# Patient Record
Sex: Female | Born: 1935
Health system: Southern US, Community
[De-identification: ages and names within clinical notes are randomized; demographics above are authoritative.]

## PROBLEM LIST (undated history)

## (undated) DIAGNOSIS — R7303 Prediabetes: Secondary | ICD-10-CM

## (undated) DIAGNOSIS — C44301 Unspecified malignant neoplasm of skin of nose: Secondary | ICD-10-CM

## (undated) DIAGNOSIS — E039 Hypothyroidism, unspecified: Secondary | ICD-10-CM

## (undated) DIAGNOSIS — M797 Fibromyalgia: Secondary | ICD-10-CM

## (undated) DIAGNOSIS — I10 Essential (primary) hypertension: Secondary | ICD-10-CM

## (undated) DIAGNOSIS — M1990 Unspecified osteoarthritis, unspecified site: Secondary | ICD-10-CM

## (undated) DIAGNOSIS — D500 Iron deficiency anemia secondary to blood loss (chronic): Secondary | ICD-10-CM

## (undated) DIAGNOSIS — R002 Palpitations: Secondary | ICD-10-CM

## (undated) DIAGNOSIS — E785 Hyperlipidemia, unspecified: Secondary | ICD-10-CM

## (undated) DIAGNOSIS — Z9989 Dependence on other enabling machines and devices: Principal | ICD-10-CM

## (undated) DIAGNOSIS — IMO0002 Reserved for concepts with insufficient information to code with codable children: Secondary | ICD-10-CM

## (undated) DIAGNOSIS — G4733 Obstructive sleep apnea (adult) (pediatric): Secondary | ICD-10-CM

## (undated) DIAGNOSIS — Z85828 Personal history of other malignant neoplasm of skin: Secondary | ICD-10-CM

## (undated) DIAGNOSIS — K279 Peptic ulcer, site unspecified, unspecified as acute or chronic, without hemorrhage or perforation: Secondary | ICD-10-CM

## (undated) HISTORY — DX: Fibromyalgia: M79.7

## (undated) HISTORY — DX: Obstructive sleep apnea (adult) (pediatric): G47.33

## (undated) HISTORY — DX: Prediabetes: R73.03

## (undated) HISTORY — DX: Personal history of other malignant neoplasm of skin: Z85.828

## (undated) HISTORY — DX: Peptic ulcer, site unspecified, unspecified as acute or chronic, without hemorrhage or perforation: K27.9

## (undated) HISTORY — DX: Hyperlipidemia, unspecified: E78.5

## (undated) HISTORY — PX: ABDOMINAL HYSTERECTOMY: SHX81

## (undated) HISTORY — DX: Reserved for concepts with insufficient information to code with codable children: IMO0002

## (undated) HISTORY — PX: EYE SURGERY: SHX253

## (undated) HISTORY — DX: Dependence on other enabling machines and devices: Z99.89

## (undated) HISTORY — DX: Unspecified malignant neoplasm of skin of nose: C44.301

## (undated) HISTORY — DX: Hypothyroidism, unspecified: E03.9

## (undated) HISTORY — DX: Unspecified osteoarthritis, unspecified site: M19.90

## (undated) HISTORY — DX: Iron deficiency anemia secondary to blood loss (chronic): D50.0

## (undated) HISTORY — DX: Palpitations: R00.2

## (undated) HISTORY — PX: BREAST BIOPSY: SHX20

---
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
## 2001-05-09 ENCOUNTER — Other Ambulatory Visit: Admission: RE | Admit: 2001-05-09 | Discharge: 2001-05-09 | Payer: Self-pay | Admitting: Family Medicine

## 2003-02-01 ENCOUNTER — Other Ambulatory Visit: Admission: RE | Admit: 2003-02-01 | Discharge: 2003-02-01 | Payer: Self-pay | Admitting: Family Medicine

## 2003-08-07 ENCOUNTER — Ambulatory Visit (HOSPITAL_COMMUNITY): Admission: RE | Admit: 2003-08-07 | Discharge: 2003-08-07 | Payer: Self-pay | Admitting: Internal Medicine

## 2003-10-18 ENCOUNTER — Ambulatory Visit (HOSPITAL_COMMUNITY): Admission: RE | Admit: 2003-10-18 | Discharge: 2003-10-18 | Payer: Self-pay | Admitting: Family Medicine

## 2005-02-01 ENCOUNTER — Other Ambulatory Visit: Admission: RE | Admit: 2005-02-01 | Discharge: 2005-02-01 | Payer: Self-pay | Admitting: Family Medicine

## 2008-08-22 LAB — HM DEXA SCAN

## 2008-10-16 ENCOUNTER — Encounter (INDEPENDENT_AMBULATORY_CARE_PROVIDER_SITE_OTHER): Payer: Self-pay | Admitting: Family Medicine

## 2008-10-16 ENCOUNTER — Ambulatory Visit: Payer: Self-pay | Admitting: Cardiology

## 2008-10-16 ENCOUNTER — Inpatient Hospital Stay (HOSPITAL_COMMUNITY): Admission: EM | Admit: 2008-10-16 | Discharge: 2008-10-17 | Payer: Self-pay | Admitting: Emergency Medicine

## 2008-11-14 DIAGNOSIS — I480 Paroxysmal atrial fibrillation: Secondary | ICD-10-CM

## 2008-11-14 DIAGNOSIS — E785 Hyperlipidemia, unspecified: Secondary | ICD-10-CM

## 2008-11-22 ENCOUNTER — Ambulatory Visit: Payer: Self-pay | Admitting: Cardiology

## 2008-12-16 ENCOUNTER — Ambulatory Visit: Payer: Self-pay | Admitting: Cardiology

## 2008-12-16 ENCOUNTER — Encounter (HOSPITAL_COMMUNITY): Admission: RE | Admit: 2008-12-16 | Discharge: 2009-01-15 | Payer: Self-pay | Admitting: Cardiology

## 2008-12-18 ENCOUNTER — Encounter (INDEPENDENT_AMBULATORY_CARE_PROVIDER_SITE_OTHER): Payer: Self-pay

## 2009-07-16 ENCOUNTER — Ambulatory Visit: Payer: Self-pay | Admitting: Cardiology

## 2009-07-18 ENCOUNTER — Encounter: Payer: Self-pay | Admitting: Cardiology

## 2009-11-17 LAB — HEMOCCULT GUIAC POC 1CARD (OFFICE)

## 2010-01-01 LAB — HM PAP SMEAR

## 2010-02-16 LAB — HM MAMMOGRAPHY

## 2010-06-23 NOTE — Assessment & Plan Note (Signed)
Summary: U0A   Visit Type:  Follow-up Primary Provider:  Dr. Rudi Heap   History of Present Illness: 75 year old woman presents for followup. She reports rare, brief palpitations, no chest pain, and no limiting shortness of breath with activity. She has been doing chores in the house and also some yard work, but plans to return to walking regularly when the weather warms up.  Followup Myoview in July 2010 revealed breast attenuation artifact without ischemia, LVEF greater than 65%.  Jenna [name] states that she was not able to tolerate Zocor related to hip pain. Lipids are being followed by Dr. Christell Constant, and she is taking omega-3 supplements at this time.  Current Medications (verified): 1)  Metoprolol Tartrate 50 Mg Tabs (Metoprolol Tartrate) .[name].. 1 Tablet Once Daily 2)  Synthroid 75 Mcg Tabs (Levothyroxine Sodium) .... Take 1 Tab Daily 3)  Fish Oil Concentrate 1000 Mg Caps (Omega-3 Fatty Acids) .... Take 1 Tab Daily 4)  Aspirin 81 Mg Tbec (Aspirin) .... Take One Tablet By Mouth Daily  Allergies (verified): No Known Drug Allergies  Past History:  Past Medical History: Last updated: 11/14/2008 Atrial Fibrillation Hyperlipidemia Hypothyroidism Fibromyalgia  Social History: Last updated: 11/14/2008 Married  Tobacco Use - No.  Alcohol Use - no  Clinical Review Panels:  Echocardiogram Echocardiogram   Study Conclusions    1. Left ventricle: The cavity size was at the lower limits of      normal. Wall thickness was increased increased in a pattern of      mild to moderate LVH. Systolic function was normal. The estimated      ejection fraction was in the range of 60% to 65%. Wall motion was      normal; there were no regional wall motion abnormalities. Doppler      parameters are consistent with abnormal left ventricular      relaxation (grade 1 diastolic dysfunction).   2. Aortic valve: Mildly calcified annulus. Trileaflet. Trivial      regurgitation.   3. Mitral  valve: Mild regurgitation.   4. Left atrium: The atrium was mildly dilated.   5. Right atrium: The atrium was mildly dilated. (10/16/2008)    Review of Systems  The patient denies anorexia, fever, chest pain, syncope, dyspnea on exertion, hemoptysis, melena, hematochezia, and severe indigestion/heartburn.         Otherwise reviewed and negative.  Vital Signs:  Patient profile:   75 year old female Height:      66 inches Weight:      217 pounds BMI:     35.15 Pulse rate:   61 / minute BP sitting:   118 / 59  (right arm)  Vitals Entered By: Dreama Saa, CNA (July 16, 2009 1:28 PM)  Physical Exam  Additional Exam:  Overweight woman in no acute distress. HEENT: Conjunctiva and lids normal, oropharynx with moist mucosa. Neck: Supple, no carotid bruits or thyromegaly. Lungs: Clear to auscultation, nonlabored. Cardiac: Regular rate and rhythm, no S3. Extremities: No pitting edema.   EKG  Procedure date:  07/16/2009  Findings:      Normal sinus rhythm at 66 beats per minute.  Impression & Recommendations:  Problem # 1:  ATRIAL FIBRILLATION (ICD-427.31)  Symptomatically quite stable, on present medical regimen including aspirin and low-dose metoprolol. Her electrocardiogram is normal, showing sinus rhythm today. We will plan to adopt an annual followup approach, assuming she develops no worsening interval symptoms. Otherwise she will continue regular followup with Dr. Christell Constant.  Her updated medication list for  this problem includes:    Metoprolol Tartrate 50 Mg Tabs (Metoprolol tartrate) .[name]... 1 tablet once daily    Aspirin 81 Mg Tbec (Aspirin) .[name]... Take one tablet by mouth daily  Problem # 2:  HYPERLIPIDEMIA (ICD-272.4)  Patient intolerant of Zocor. She is being followed by Dr. Christell Constant, on omega-3 supplements.  Patient Instructions: 1)  Your physician recommends that you schedule a follow-up appointment in: 1 year 2)  Your physician recommends that you continue on  your current medications as directed. Please refer to the Current Medication list given to you today.

## 2010-07-10 ENCOUNTER — Encounter: Payer: Self-pay | Admitting: Cardiology

## 2010-07-15 ENCOUNTER — Ambulatory Visit (INDEPENDENT_AMBULATORY_CARE_PROVIDER_SITE_OTHER): Payer: Medicare Other | Admitting: Cardiology

## 2010-07-15 ENCOUNTER — Encounter: Payer: Self-pay | Admitting: Cardiology

## 2010-07-15 DIAGNOSIS — E782 Mixed hyperlipidemia: Secondary | ICD-10-CM

## 2010-07-15 DIAGNOSIS — I4891 Unspecified atrial fibrillation: Secondary | ICD-10-CM

## 2010-07-16 ENCOUNTER — Encounter: Payer: Self-pay | Admitting: Cardiology

## 2010-07-21 NOTE — Assessment & Plan Note (Signed)
Summary: Jenna [name]   Visit Type:  Follow-up Primary Provider:  Dr. Rudi Heap   History of Present Illness: 75 year old woman presents for followup. She was seen in February 2011. Reports occasional palpitations, usually in the evenings, however no progression or change in pattern. Otherwise she remains active, working with her church, walking regularly.  Recent lab work per Dr. Christell Constant showed BUN 18, creatinine 0.6, sodium 141, potassium 4.6, AST 17, ALT 12, TSH 2.5.  She reports no focal weakness, speech or memory problems. Continues on aspirin.   Current Medications (verified): 1)  Metoprolol Tartrate 50 Mg Tabs (Metoprolol Tartrate) .[name].. 1 Tablet Once Daily 2)  Synthroid 75 Mcg Tabs (Levothyroxine Sodium) .... Take 1 Tab Daily 3)  Fish Oil Concentrate 1000 Mg Caps (Omega-3 Fatty Acids) .... Take 1 Tab Daily 4)  Aspirin 81 Mg Tbec (Aspirin) .... Take One Tablet By Mouth Daily 5)  Mobic 7.5 Mg Tabs (Meloxicam) .... As Needed 6)  Vitamin D3 1000 Unit Tabs (Cholecalciferol) .... Take 2 Tabs Daily 7)  Livalo 2 Mg Tabs (Pitavastatin Calcium) .... Take 1 Tab Daily  Allergies (verified): No Known Drug Allergies  Comments:  Nurse/Medical Assistant: patient brought med list she was started on livalo yesterday per Dr.Moore  Past History:  Past Medical History: Last updated: 11/14/2008 Atrial Fibrillation Hyperlipidemia Hypothyroidism Fibromyalgia  Social History: Last updated: 11/14/2008 Married  Tobacco Use - No.  Alcohol Use - no  Review of Systems  The patient denies anorexia, fever, weight loss, chest pain, syncope, dyspnea on exertion, peripheral edema, melena, hematochezia, and severe indigestion/heartburn.         Tripped and had a fall back in December spraining her right ankle. Otherwise reviewed and negative except as outlined.  Vital Signs:  Patient profile:   75 year old female Weight:      207 pounds BMI:     33.53 Pulse rate:   61 / minute BP sitting:   134  / 70  (left arm)  Vitals Entered By: Dreama Saa, CNA (July 15, 2010 12:41 PM)  Physical Exam  Additional Exam:  Overweight woman in no acute distress. HEENT: Conjunctiva and lids normal, oropharynx with moist mucosa. Neck: Supple, no carotid bruits or thyromegaly. Lungs: Clear to auscultation, nonlabored. Cardiac: Regular rate and rhythm, no S3. Extremities: No pitting edema.   Nuclear Study  Procedure date:  12/16/2008  Findings:      Scintigraphic Data: Acquisition notable for moderate breast   attenuation.  Left ventricular size was normal.  On tomographic   images reconstructed in standard planes, there was a very small   area of thinning in the inferolateral segment that was of   borderline numeric significance and for which no reversibility was   apparent.  The gated reconstruction demonstrated normal to   hyperdynamic regional and global LV systolic function as well as   normal systolic accentuation of activity throughout.  Estimated   ejection fraction exceeded 65%.    IMPRESSION:   Negative pharmacologic stress nuclear myocardial study revealing no   stress induced EKG abnormalities, normal left ventricular size and   normal left ventricular systolic function.  By scintigraphic   imaging, there was breast attenuation artifact without convincing   evidence for ischemia or infarction.  Other findings as noted.  EKG  Procedure date:  07/15/2010  Findings:      Normal sinus rhythm at 60 beats per minute, small R prime leads V1 and V2. Decreased R wave progression.  Impression & Recommendations:  Problem # 1:  ATRIAL FIBRILLATION (ICD-427.31)  Symptomatically stable, no progression in frequency or pattern of palpitations. She continues on aspirin with low CHADS2 score. Also metoprolol. Continue annual followup, sooner if needed.  Her updated medication list for this problem includes:    Metoprolol Tartrate 50 Mg Tabs (Metoprolol tartrate) .[name]... 1 tablet  once daily    Aspirin 81 Mg Tbec (Aspirin) .[name]... Take one tablet by mouth daily  Problem # 2:  HYPERLIPIDEMIA (ICD-272.4)  Followed by Dr. Christell Constant.  Her updated medication list for this problem includes:    Livalo 2 Mg Tabs (Pitavastatin calcium) .[name]... Take 1 tab daily  Patient Instructions: 1)  Your physician recommends that you schedule a follow-up appointment in: 1 year 2)  Your physician recommends that you continue on your current medications as directed. Please refer to the Current Medication list given to you today.

## 2010-08-12 ENCOUNTER — Other Ambulatory Visit: Payer: Self-pay | Admitting: Cardiology

## 2010-08-20 NOTE — Telephone Encounter (Signed)
[place] 

## 2010-08-20 NOTE — Telephone Encounter (Signed)
[facility] refill patient

## 2010-09-01 LAB — T4, FREE: Free T4: 1.22 ng/dL (ref 0.80–1.80)

## 2010-09-01 LAB — CBC
HCT: 37.9 % (ref 36.0–46.0)
HCT: 39.8 % (ref 36.0–46.0)
Hemoglobin: 14.1 g/dL (ref 12.0–15.0)
MCHC: 35.4 g/dL (ref 30.0–36.0)
MCHC: 35.9 g/dL (ref 30.0–36.0)
MCV: 94.5 fL (ref 78.0–100.0)
MCV: 94.6 fL (ref 78.0–100.0)
Platelets: 164 10*3/uL (ref 150–400)
RBC: 4 MIL/uL (ref 3.87–5.11)
RBC: 4.21 MIL/uL (ref 3.87–5.11)
WBC: 5.1 10*3/uL (ref 4.0–10.5)

## 2010-09-01 LAB — BASIC METABOLIC PANEL
BUN: 10 mg/dL (ref 6–23)
BUN: 9 mg/dL (ref 6–23)
CO2: 28 mEq/L (ref 19–32)
Calcium: 9 mg/dL (ref 8.4–10.5)
Calcium: 9.4 mg/dL (ref 8.4–10.5)
Creatinine, Ser: 0.62 mg/dL (ref 0.4–1.2)
Creatinine, Ser: 0.69 mg/dL (ref 0.4–1.2)
GFR calc non Af Amer: >60 mL/min (ref >60)
Glucose, Bld: 107 mg/dL — ABNORMAL HIGH (ref 70–99)
Glucose, Bld: 161 mg/dL — ABNORMAL HIGH (ref 70–99)
Sodium: 144 mEq/L (ref 135–145)

## 2010-09-01 LAB — DIFFERENTIAL
Basophils Absolute: 0 10*3/uL (ref 0.0–0.1)
Basophils Absolute: 0 10*3/uL (ref 0.0–0.1)
Basophils Relative: 0 % (ref 0–1)
Basophils Relative: 1 % (ref 0–1)
Eosinophils Relative: 2 % (ref 0–5)
Lymphs Abs: 1.7 10*3/uL (ref 0.7–4.0)
Monocytes Absolute: 0.4 10*3/uL (ref 0.1–1.0)
Neutrophils Relative %: 56 % (ref 43–77)

## 2010-09-01 LAB — MAGNESIUM
Magnesium: 2.1 mg/dL (ref 1.5–2.5)
Magnesium: 2.1 mg/dL (ref 1.5–2.5)

## 2010-09-01 LAB — POCT CARDIAC MARKERS
Myoglobin, poc: 57.8 ng/mL (ref 12–200)
Troponin i, poc: <0.05 ng/mL (ref 0.00–0.09)

## 2010-09-01 LAB — T3, FREE: T3, Free: 3 pg/mL (ref 2.3–4.2)

## 2010-09-01 LAB — TSH: TSH: 2.635 u[IU]/mL (ref 0.350–4.500)

## 2010-09-01 LAB — LIPID PANEL
Cholesterol: 174 mg/dL (ref 0–200)
HDL: 48 mg/dL (ref >39)

## 2010-09-01 LAB — CULTURE, BLOOD (ROUTINE X 2)

## 2010-09-06 ENCOUNTER — Encounter: Payer: Self-pay | Admitting: Family Medicine

## 2010-09-06 DIAGNOSIS — M797 Fibromyalgia: Secondary | ICD-10-CM | POA: Insufficient documentation

## 2010-09-06 DIAGNOSIS — H269 Unspecified cataract: Secondary | ICD-10-CM | POA: Insufficient documentation

## 2010-09-06 DIAGNOSIS — I4891 Unspecified atrial fibrillation: Secondary | ICD-10-CM

## 2010-09-06 DIAGNOSIS — E039 Hypothyroidism, unspecified: Secondary | ICD-10-CM

## 2010-09-06 DIAGNOSIS — B029 Zoster without complications: Secondary | ICD-10-CM | POA: Insufficient documentation

## 2010-09-06 DIAGNOSIS — K279 Peptic ulcer, site unspecified, unspecified as acute or chronic, without hemorrhage or perforation: Secondary | ICD-10-CM

## 2010-09-06 DIAGNOSIS — IMO0002 Reserved for concepts with insufficient information to code with codable children: Secondary | ICD-10-CM | POA: Insufficient documentation

## 2010-09-06 DIAGNOSIS — M1990 Unspecified osteoarthritis, unspecified site: Secondary | ICD-10-CM

## 2010-09-16 ENCOUNTER — Encounter (INDEPENDENT_AMBULATORY_CARE_PROVIDER_SITE_OTHER): Payer: Self-pay | Admitting: Internal Medicine

## 2010-10-06 NOTE — Consult Note (Signed)
NAME:  Jenna [name], Jenna [name] NO.:  [id number]   MEDICAL RECORD NO.:  [id number]          PATIENT TYPE:  INP   LOCATION:  A325                          FACILITY:  [facility]   PHYSICIAN:  Jonelle Sidle, MD DATE OF BIRTH:  Apr 02, 1936   DATE OF CONSULTATION:  10/16/2008  DATE OF DISCHARGE:                                 CONSULTATION   REQUESTING SERVICE:  Hospitalist Team.   PRIMARY CARE PHYSICIAN:  Dr. Rudi Heap.   REASON FOR CONSULTATION:  New diagnosis of atrial fibrillation with  rapid ventricular response.   HISTORY OF PRESENT ILLNESS:  [name] is a very pleasant 75-year-  old woman with a history of hypothyroidism, fibromyalgia and  hyperlipidemia.  She has no known history of cardiovascular disease or  cardiac dysrhythmia.  She states that she has been in her usual state of  health but has noted occasionally brief palpitations, mainly in the  evening.  Typically, symptoms have lasted no longer than a few minutes  and consist of a feeling of rapid irregular heartbeat/fluttering in the  chest.  Last night when preparing for bed, she developed similar  symptoms although they persisted, and she ultimately went to the  emergency department where she was noted to be in atrial fibrillation  with heart rates up into the 160s.  She was treated with beta-blocker  therapy, both intravenously and with subsequent p.o. dosing, and was  admitted for further observation on telemetry.  She converted  spontaneously to sinus rhythm just before 8 o'clock this morning.  She  has had no preceding exertional chest pain or breathlessness beyond NYHA  Class II.  Her cardiac markers at this point are normal.  Electrocardiogram from early this morning showed atrial fibrillation of  100 beats per minute with borderline low voltage and decreased R-wave  progression anteriorly with nonspecific ST-T wave changes.   ALLERGIES:  SULFA DRUGS and PENICILLIN.   MEDICATIONS:  At present  include:  1. Aspirin 81 mg p.o. daily.  2. Lovenox 40 mg subcutaneous q.24 h.  3. Lopressor 20 mg p.o. q.8 h.  4. Protonix 40 mg p.o. daily.   At home, she is on Synthroid and Zocor (dose is not clear at this time).   PAST MEDICAL HISTORY:  As outlined above.  She is also status post  cesarean section and right breast biopsy in the past.   SOCIAL HISTORY:  The patient is married.  There is no active tobacco or  alcohol use history.   FAMILY HISTORY:  Was reviewed and noncontributory for obvious premature  cardiovascular disease.   REVIEW OF SYSTEMS:  The patient denies any cough, hemoptysis, fevers or  chills.  No bleeding diathesis, melena, hematochezia.  She does have  occasional typically brief palpitations as outlined above.  No  reproducible exertional chest pain or limiting breathlessness.  No falls  or syncope.  No dizziness.  Otherwise systems reviewed and negative.   PHYSICAL EXAMINATION:  Temperature is 98.4 degrees, heart rate 91 and  regular, respirations 20, blood pressure 120/58.  Oxygen saturation 94%  on room air.  Weight 67  kg.  The patient is comfortable in no acute  distress.  HEENT:  Conjunctiva is normal.  Oropharynx clear.  NECK:  Is supple.  No elevated jugular venous pressure.  No loud carotid  bruits.  No thyromegaly or thyroid tenderness.  LUNGS:  Are clear with diminished breath sounds.  Nonlabored breathing  at rest.  CARDIAC EXAM:  Reveals a regular rate and rhythm.  PMI is indistinct.  Soft systolic murmur at the base.  No pericardial rub.  ABDOMEN:  Is nontender.  Positive bowel sounds.  No loud bruit.  EXTREMITIES:  Exhibit no frank pitting edema.  Distal pulses are 1+.  SKIN:  Warm and dry.  MUSCULOSKELETAL:  No kyphosis noted.  NEUROPSYCHIATRIC:  The patient is alert x3.  Affect is appropriate.   LABORATORY DATA:  WBCs 5.1, hemoglobin 13.6, hematocrit 37.9, platelets  164.  Sodium 144, potassium 4.1, chloride 110, bicarb 31, glucose 107,   BUN 9, creatinine 0.6, magnesium 2.1.  CK-MB less than 1.0, troponin-I  less than 0.05.  BNP 44.  Blood cultures drawn and pending.   Chest x-ray reports no acute abnormalities.   IMPRESSION:  1. Newly diagnosed atrial fibrillation associated with rapid      ventricular response in the setting of typically more brief      intermittent palpitations.  I suspect she has had paroxysmal atrial      fibrillation at least over the last several months based on her      description of symptoms.  She has spontaneously converted to normal      sinus rhythm on beta-blocker therapy and feels symptomatically      improved.  Her CHADS2 score is 1 at this point.  2. History of hypothyroidism, on Synthroid as an outpatient.  3. Hyperlipidemia, on Zocor as an outpatient.   RECOMMENDATIONS:  At this point, would treat with full-dose aspirin and  convert Lopressor to 50 mg p.o. b.i.d.  Two-D echocardiography has been  ordered and will follow up on this to assess left ventricular function  and valvular status.  The patient should ambulate with assistance.  TSH  will also be sent as well as a follow-up electrocardiogram now that she  has converted to sinus rhythm.  If she remains symptomatically stable,  it is likely that she can be discharged home later today with follow-up  arranged in our office over the next few weeks.  I would  anticipate ultimately that she will need a follow-up stress test, and  then depending on how she does symptomatically on beta-blocker therapy,  we may need to adjust medications or perhaps even consider an  antiarrhythmic depending on how frequent she has breakthrough  symptomatology.      Jonelle Sidle, MD  Electronically Signed     SGM/MEDQ  D:  10/16/2008  T:  10/16/2008  Job:  [id number]   cc:   Ernestina Penna, M.D.  Fax: [phone]   [facility]

## 2010-10-06 NOTE — H&P (Signed)
[name], [name] NO.:  [id number]   MEDICAL RECORD NO.:  [id number]          PATIENT TYPE:  EMS   LOCATION:  ED                            FACILITY:  [facility]   PHYSICIAN:  Skeet Latch, DO    DATE OF BIRTH:  1935-12-17   DATE OF ADMISSION:  10/16/2008  DATE OF DISCHARGE:  LH                              HISTORY & PHYSICAL   PRIMARY CARE PHYSICIAN:  Dr. Christell Constant.   CHIEF COMPLAINT:  Palpitations.   HISTORY OF PRESENT ILLNESS:  This is a 75 year old Caucasian female who  presents with complaint of palpitations.  The patient states that this  evening she started feeling her heart race while going out to bed.  The  patient admits to having palpitation-type symptoms over the last month,  but tonight it was persistent.  The patient states it is acute in  nature, it is persistent, but denies any associated pain.  The patient  denies any chest pain, diaphoresis, nausea, vomiting or abdominal pain  at this time.  The patient has no history of cardiac problems.  The  patient does admit in the past approximately a year ago she had similar  symptoms.  She states that she had what she believes was an  echocardiogram that was unremarkable.  On exam patient is stable, not  having any major complaints at this time.   PAST MEDICAL HISTORY:  1. Fibromyalgia.  2. Hyperlipidemia.  3. Hypothyroidism.   SURGICAL HISTORY:  Cesarean section, right breast biopsy.   SOCIAL HISTORY:  No history of smoking, alcohol or illicit drug use.   ALLERGIES:  Questionable antidepressant.   HOME MEDICATIONS:  1. Synthroid unknown dose and frequency.  2. Calcium unknown dose and frequency.  3. Zocor unknown dose and frequency.  4. Mobic p.r.n.   REVIEW OF SYSTEMS:  She has had no weight loss, weight gain.  Does admit  to having decreased appetite.  HEENT: Unremarkable.  CARDIOVASCULAR:  Positive for palpitations.  No chest pain.  RESPIRATORY:  Unremarkable.  GI: Unremarkable.  GU:  Unremarkable.  Other systems unremarkable.   PHYSICAL EXAMINATION:  VITAL SIGNS:  Temperature is 98.0, respiratory  rate 20, pulses 98, blood pressure 137/67.  CONSTITUTIONAL:  She is well-developed, well-nourished, well-hydrated,  no acute distress.  HEENT:  Head is atraumatic, normocephalic.  Eyes: PERRL.  EOMI.  NECK:  Soft, supple, nontender, nondistended.  Oral mucosa is slightly  dry.  CARDIOVASCULAR:  She is a regular rate a regular rhythm.  She is  tachycardiac.  No murmurs, rubs or gallops.  LUNGS:  Clear to auscultation bilaterally.  No rales, rhonchi or  wheezing.  ABDOMEN:  Obese, soft, nontender, nondistended.  No masses, no rigidity,  no guarding.  EXTREMITIES:  No clubbing, cyanosis or edema.  NEUROLOGIC:  Cranial nerves II-XII grossly intact.  Patient alert and  oriented x3.  SKIN:  Normal.  No rash, no petechiae.   LABORATORY DATA:  Magnesium 2.1.  Sodium 141, potassium 3.8, chloride  106, CO2 28, glucose 161, BUN 10, creatinine 0.69, myoglobin 67.8, CK-MB  less than one, troponin less than 0.05.  White count is 5.5, hemoglobin  14.1, hematocrit 39.8, platelet count 162,000.  Chest x-ray showed no  acute abnormalities.   ASSESSMENT:  1. New onset of atrial fibrillation with rapid ventricular rate.  2. History of hypothyroidism.  3. History of hyperlipidemia.  4. History of fibromyalgia.   PLAN:  1. The patient admitted to service of [facility].  2. For her new onset atrial fibrillation with rapid ventricular      response, the patient was given two doses of IV Lopressor in the      emergency room with results.  At this time, we will place the      patient on p.o. metoprolol and adjunct Lopressor IV as needed for      heart rate greater than 110.  Will get a Cardiology consult in the      morning.  Also, order a 2-D echocardiogram at this time.  Will add      a thyroid studies to her a.m. labs also.  The patient will also be      placed on aspirin on a  daily basis.  I will wait recommendations      from Cardiology regarding any other medications or testing.  3. History of hypothyroidism.  As stated, the patient will get thyroid      level in the morning.  4. Cholesterol, history of hyperlipidemia.  The patient is on Zocor,      unknown dose.  We will await home medications.  Will get a lipid      panel in the morning also.  5. The patient will be on DVT as well as GI prophylaxis.      Skeet Latch, DO  Electronically Signed     SM/MEDQ  D:  10/16/2008  T:  10/16/2008  Job:  [id number]

## 2010-10-06 NOTE — Discharge Summary (Signed)
Jenna [name], Jenna [name]              ACCOUNT NO.:  [id number]   MEDICAL RECORD NO.:  [id number]          PATIENT TYPE:  INP   LOCATION:  A325                          FACILITY:  [facility]   PHYSICIAN:  Renee Ramus, MD       DATE OF BIRTH:  [birth year]   DATE OF ADMISSION:  10/16/2008  DATE OF DISCHARGE:  LH                               DISCHARGE SUMMARY   PRIMARY DISCHARGE DIAGNOSIS:  New-onset atrial fibrillation.   SECONDARY DIAGNOSES:  1. Hyperlipidemia.  2. Hypothyroid.  3. Fibromyalgia.   HOSPITAL COURSE BY PROBLEM:  1. New-onset atrial fibrillation.  The patient is a 75 year old female      who was admitted secondary to new-onset palpitations.  The patient      was diagnosed with atrial fibrillation.  She was seen in the      emergency department and admitted to our service.  The patient did      receive a consult from cardiology.  The patient spontaneously      converted back to normal sinus rhythm.  She had no previous history      of atrial fibrillation.  The patient is now back in sinus rhythm.      The patient currently had no complaints and is anxious for      discharge.  The patient did receive an echocardiogram that showed      grade 1 diastolic dysfunction but no evidence of valvular      abnormalities or evidence of congestive heart failure.  The patient      is now being discharged to home instructions to follow up with her      primary care physician.  2. Hyperlipidemia.  This is currently stable.  The patient is at LDL      goal and no changes to her Zocor will be made at this time.  3. Hypothyroid.  The patient was found to be therapeutic on her      current dose of levothyroxine, and this medication was not      adjusted.   LABORATORY DATA OF NOTE:  1. Normal CBC.  2. Mild increased blood glucose at 107.  3. Normal cardiac enzymes.  4. Total cholesterol of 174 with an LDL of 103 and an HDL of 48.  5. TSH of 2.45 with a free T4 of 1.22.   STUDIES:   Echocardiogram showing a left ventricular cavity in the lower  limits of normal consistent with grade 1 diastolic dysfunction with EF  in the 60 to 65% range and no regional wall motion abnormalities, as  well as normal valvular architecture.   MEDICATIONS ON DISCHARGE:  1. Synthroid 75 mcg p.o. daily.  2. Zocor 40 mg p.o. daily.  3. Fish oil 1 tablet p.o. daily.  4. Calcium supplement 500 mg p.o. b.i.d.  5. Colchicine 0.6 mg p.o. daily p.r.n. gout.  6. Lopressor 50 mg p.o. b.i.d.  7. Aspirin 81 mg p.o. daily.   There are no labs or studies pending at time of discharge.  The patient  is in stable condition and anxious for discharge.  TIME SPENT:  35 minutes.      Renee Ramus, MD  Electronically Signed     JF/MEDQ  D:  10/17/2008  T:  10/17/2008  Job:  [id number]   cc:   Jonelle Sidle, MD  [address]  [place], [place] [address]   Ernestina Penna, M.D.  Fax: [phone]

## 2010-10-06 NOTE — Assessment & Plan Note (Signed)
[facility]                       [place] CARDIOLOGY OFFICE NOTE   ELLARIE, PICKING                     MRN:          [id number]  DATE:11/22/2008                            DOB:          [birth year]    PRIMARY CARE PHYSICIAN:  Ernestina Penna, MD   REASON FOR VISIT:  Followup paroxysmal atrial fibrillation.   HISTORY OF PRESENT ILLNESS:  I saw Ms. Kagawa during an inpatient  consultation in late May with newly diagnosed paroxysmal atrial  fibrillation associated with rapid ventricular response.  She  spontaneously converted to normal sinus rhythm with rate control and  ruled out for myocardial infarction at that time.  Her CHADS2 score was  felt to be approximately 1.  She was referred for an echocardiogram,  which demonstrated a left ventricular ejection fraction of 60-65%  associated with mild-to-moderate left ventricular hypertrophy and no  regional wall motion abnormalities with mild diastolic dysfunction.  She  had mild mitral regurgitation and a mildly dilated left atrium.  She has  been managed with low-dose aspirin and Toprol-XL 50 mg daily with very  good symptom control.  She has had perhaps 1 episode of very brief  palpitations, but nothing prolonged.  Her electrocardiogram today in  followup shows normal sinus rhythm at 61 beats per minute.  She has had  blood work followed with in Dr. Langston Masker office and has otherwise been  doing reasonably well.  Today, we talked about a followup ischemic  evaluation and otherwise continued observation.   ALLERGIES:  Amitriptyline, penicillin, Prozac, Daypro, Effexor, Lipitor,  Voltaren, and Cymbalta.   PRESENT MEDICATIONS:  1. Aspirin 81 mg p.o. daily.  2. Toprol-XL 50 mg p.o. daily.  3. Omega-3 supplements 1000 mg p.o. daily.  4. Synthroid 75 mcg p.o. daily.  5. Meloxicam 7.5 mg p.r.n.  6. Tylenol Extra Strength p.r.n.   REVIEW OF SYSTEMS:  As outlined above.  No speech deficits,  focal  weakness, or visual changes.  Only rare brief palpitations described.  No chest pain or limiting dyspnea on exertion.  Otherwise reviewed  negative.   PHYSICAL EXAMINATION:  VITAL SIGNS:  Blood pressure is 116/64, heart  rate is 61 and regular, weight 214 pounds.  GENERAL:  The patient is comfortable in no acute distress.  HEENT:  Conjunctivae are normal.  Oropharynx is clear.  NECK:  Supple.  No elevated jugular venous pressure.  No loud bruits.  No thyromegaly in noted.  LUNGS:  Clear without labored breathing at rest.  CARDIAC:  Regular rate and rhythm.  Indistinct PMI.  No loud systolic  murmur or pericardial rub.  ABDOMEN:  Soft, nontender.  No hepatomegaly.  EXTREMITIES:  Exhibit hemostasis.  No frank pitting edema.  Distal  pulses are 2+.  SKIN:  Warm and dry.  MUSCULOSKELETAL:  No kyphosis noted.  NEUROPSYCHIATRIC:  The patient is alert and oriented x3.  Affect is  appropriate   IMPRESSION AND RECOMMENDATIONS:  Paroxysmal atrial fibrillation,  diagnosed in May 2010.  Symptoms of palpitations are now improved on  beta-blocker therapy and with a CHADS2 score of 1, our plan is to  continue aspirin daily at this time.  Followup electrocardiogram today  shows normal sinus rhythm.  She has a history of thyroid disease, on  Synthroid and her TSH was within normal range in May 2010.  We will plan  a followup Lexiscan Myoview on medical therapy to exclude underlying  ischemic heart disease and otherwise if this is low-risk, we will plan  to see her back in the office over the next 6 months.  At this point, no  firm indication to push for antiarrhythmic therapy given good control of  symptoms on beta-blockers.     Jonelle Sidle, MD  Electronically Signed    SGM/MedQ  DD: 11/22/2008  DT: 11/23/2008  Job #: [id number]   cc:   Ernestina Penna, M.D.

## 2010-10-09 NOTE — Op Note (Signed)
NAME:  Jenna [name], Jenna [name]                        ACCOUNT NO.:  [id number]   MEDICAL RECORD NO.:  [id number]                   PATIENT TYPE:  AMB   LOCATION:  DAY                                  FACILITY:  [facility]   PHYSICIAN:  Lionel December, M.D.                 DATE OF BIRTH:  [birth year]   DATE OF PROCEDURE:  08/07/2003  DATE OF DISCHARGE:                                 OPERATIVE REPORT   PROCEDURE:  Esophagogastroduodenoscopy with esophageal dilatation followed  by total colonoscopy.   ENDOSCOPIST:  Lionel December, M.D.   INDICATIONS:  [name] is a 75 year old Caucasian female who has had  intermittent solid food dysphagia over the last few months.  She also has  intermittent hematochezia.  Most of the time she notices a small amount of  blood per rectum with the bowel movements; however, on a few occasions she  has passed what she considers a large amount of fresh blood.  It is  suspected that she may have a hemorrhoidal bleeding, but she is undergoing  colonoscopy to make sure that she does not have any other lesion that might  account for her rectal bleeding.  Procedure and risks were reviewed with the  patient and informed consent was obtained.   PREOPERATIVE MEDICATIONS:  Cetacaine spray for oropharyngeal topical  anesthesia, Demerol 50 mg IV and Versed 6 mg IV in divided dose.   FINDINGS:  Procedure performed in endoscopy suite.  The patient's vital  signs and O2 saturation were monitored during the procedure and remained  stable.   PROCEDURE #1: ESOPHAGOGASTRODUODENOSCOPY:  The patient was placed in the  left lateral recumbent position and Olympus videoscope was passed via the  oropharynx without any difficulty into the esophagus.   ESOPHAGUS:  Mucosa of the esophagus was normal.  There appeared to be focal  tubular narrowing of the cervical esophagus.  Gastroesophageal junction was  unremarkable. No ring was noted.   STOMACH:  It was empty and distended very well  with insufflation.  The folds  of the proximal stomach were normal.  Examination of the mucosa at gastric  body, antrum, pyloric channel, as well as angularis, fundus, and cardia were  normal.   DUODENUM:  Examination of the bulb and second part of the duodenum was also  normal. Endoscope was withdrawn.   Esophagus was dilated by passing 54 and 56 [place] Maloney dilators to full  insertion. The esophagus was reexamined post ED and there was a small linear  tear of the cervical esophagus which is the site of tubular narrowing.  Pictures taken for the record and the endoscope was withdrawn and patient  prepared for procedure #2.   COLONOSCOPY:  Rectal examination performed.  She had a firm external  hemorrhoid which was felt to be thrombosed.  Digital exam was normal.   Olympus videoscope was placed in the rectum and advanced under vision into  the  sigmoid colon and beyond.  Preparation was satisfactory.  Somewhat  redundant hepatic flexure.  Scope was advanced to cecum.  Ileocecal valve  and appendiceal orifice were well seen and photographed.  I could not see  the area behind the valve well.  As the scope was withdrawn, the colonic  mucosa was, once again, carefully examined and there were no polyps, tumor  masses, or diverticular changes.  Rectal mucosa was normal.   The scope was retroflexed to examine anorectal junction and small  hemorrhoids were noted below the dentate line.  The endoscope was  straightened and withdrawn.  The patient tolerated the procedure well.   FINAL DIAGNOSES:  1. Mild tubular narrowing to cervical esophagus without mucosal     abnormalities.  2. Esophagus dilated by passing 54 and 56 [place] Maloney dilators resulting     in small mucosal tear.  3. Normal examination of the stomach, first and second part of the duodenum.  4. External hemorrhoids one of which is thrombosed, otherwise colon exam was     normal.   RECOMMENDATIONS:  1. High fiber diet.   2. Anusol HC cream to be applied in the perianal area b.i.d. for 10-14 days.  3. Sitz baths once to twice daily until thrombosed hemorrhoid is resolved.  4. If the patient remains with dysphagia she will call our office in which     case we would consider barium swallow plus a pill study.      ___________________________________________                                            Lionel December, M.D.   NR/MEDQ  D:  08/07/2003  T:  08/07/2003  Job:  [id number]   cc:   Ernestina Penna, M.D.  [address] [place]  [place] [address]  Fax: [phone]

## 2011-01-14 ENCOUNTER — Encounter: Payer: Self-pay | Admitting: Cardiology

## 2011-07-20 DIAGNOSIS — Z1231 Encounter for screening mammogram for malignant neoplasm of breast: Secondary | ICD-10-CM | POA: Diagnosis not present

## 2011-08-09 DIAGNOSIS — E559 Vitamin D deficiency, unspecified: Secondary | ICD-10-CM | POA: Diagnosis not present

## 2011-08-09 DIAGNOSIS — E039 Hypothyroidism, unspecified: Secondary | ICD-10-CM | POA: Diagnosis not present

## 2011-08-09 DIAGNOSIS — E785 Hyperlipidemia, unspecified: Secondary | ICD-10-CM | POA: Diagnosis not present

## 2011-08-09 DIAGNOSIS — R002 Palpitations: Secondary | ICD-10-CM | POA: Diagnosis not present

## 2011-08-09 DIAGNOSIS — I10 Essential (primary) hypertension: Secondary | ICD-10-CM | POA: Diagnosis not present

## 2011-08-12 DIAGNOSIS — R109 Unspecified abdominal pain: Secondary | ICD-10-CM | POA: Diagnosis not present

## 2011-08-12 DIAGNOSIS — H2510 Age-related nuclear cataract, unspecified eye: Secondary | ICD-10-CM | POA: Diagnosis not present

## 2011-08-12 DIAGNOSIS — K2900 Acute gastritis without bleeding: Secondary | ICD-10-CM | POA: Diagnosis not present

## 2011-08-12 DIAGNOSIS — J029 Acute pharyngitis, unspecified: Secondary | ICD-10-CM | POA: Diagnosis not present

## 2011-08-18 ENCOUNTER — Encounter: Payer: Self-pay | Admitting: Cardiology

## 2011-09-14 ENCOUNTER — Encounter: Payer: Self-pay | Admitting: Cardiology

## 2011-09-27 DIAGNOSIS — H2589 Other age-related cataract: Secondary | ICD-10-CM | POA: Diagnosis not present

## 2011-09-28 DIAGNOSIS — E039 Hypothyroidism, unspecified: Secondary | ICD-10-CM | POA: Diagnosis not present

## 2011-10-22 NOTE — Patient Instructions (Addendum)
Your procedure is scheduled on: 10/28/2011  Report to [facility] at   640      AM.  Call this number if you have problems the morning of surgery: [phone]   Do not eat food or drink liquids :After Midnight.      Take these medicines the morning of surgery with A SIP OF WATER:synthroid,mobic,lopressor   Do not wear jewelry, make-up or nail polish.  Do not wear lotions, powders, or perfumes. You may wear deodorant.  Do not shave 48 hours prior to surgery.  Do not bring valuables to the hospital.  Contacts, dentures or bridgework may not be worn into surgery.  Leave suitcase in the car. After surgery it may be brought to your room.  For patients admitted to the hospital, checkout time is 11:00 AM the day of discharge.   Patients discharged the day of surgery will not be allowed to drive home.  :     Please read over the following fact sheets that you were given: Coughing and Deep Breathing, Surgical Site Infection Prevention, Anesthesia Post-op Instructions and Care and Recovery After Surgery    Cataract A cataract is a clouding of the lens of the eye. When a lens becomes cloudy, vision is reduced based on the degree and nature of the clouding. Many cataracts reduce vision to some degree. Some cataracts make people more near-sighted as they develop. Other cataracts increase glare. Cataracts that are ignored and become worse can sometimes look white. The white color can be seen through the pupil. CAUSES   Aging. However, cataracts may occur at any age, even in newborns.   Certain drugs.   Trauma to the eye.   Certain diseases such as diabetes.   Specific eye diseases such as chronic inflammation inside the eye or a sudden attack of a rare form of glaucoma.   Inherited or acquired medical problems.  SYMPTOMS   Gradual, progressive drop in vision in the affected eye.   Severe, rapid visual loss. This most often happens when trauma is the cause.  DIAGNOSIS  To detect a cataract,  an eye doctor examines the lens. Cataracts are best diagnosed with an exam of the eyes with the pupils enlarged (dilated) by drops.  TREATMENT  For an early cataract, vision may improve by using different eyeglasses or stronger lighting. If that does not help your vision, surgery is the only effective treatment. A cataract needs to be surgically removed when vision loss interferes with your everyday activities, such as driving, reading, or watching TV. A cataract may also have to be removed if it prevents examination or treatment of another eye problem. Surgery removes the cloudy lens and usually replaces it with a substitute lens (intraocular lens, IOL).  At a time when both you and your doctor agree, the cataract will be surgically removed. If you have cataracts in both eyes, only one is usually removed at a time. This allows the operated eye to heal and be out of danger from any possible problems after surgery (such as infection or poor wound healing). In rare cases, a cataract may be doing damage to your eye. In these cases, your caregiver may advise surgical removal right away. The vast majority of people who have cataract surgery have better vision afterward. HOME CARE INSTRUCTIONS  If you are not planning surgery, you may be asked to do the following:  Use different eyeglasses.   Use stronger or brighter lighting.   Ask your eye doctor about reducing  your medicine dose or changing medicines if it is thought that a medicine caused your cataract. Changing medicines does not make the cataract go away on its own.   Become familiar with your surroundings. Poor vision can lead to injury. Avoid bumping into things on the affected side. You are at a higher risk for tripping or falling.   Exercise extreme care when driving or operating machinery.   Wear sunglasses if you are sensitive to bright light or experiencing problems with glare.  SEEK IMMEDIATE MEDICAL CARE IF:   You have a worsening or  sudden vision loss.   You notice redness, swelling, or increasing pain in the eye.   You have a fever.  Document Released: 05/10/2005 Document Revised: 04/29/2011 Document Reviewed: 01/01/2011 [facility] Patient Information 2012 [place], [place].PATIENT INSTRUCTIONS POST-ANESTHESIA  IMMEDIATELY FOLLOWING SURGERY:  Do not drive or operate machinery for the first twenty four hours after surgery.  Do not make any important decisions for twenty four hours after surgery or while taking narcotic pain medications or sedatives.  If you develop intractable nausea and vomiting or a severe headache please notify your doctor immediately.  FOLLOW-UP:  Please make an appointment with your surgeon as instructed. You do not need to follow up with anesthesia unless specifically instructed to do so.  WOUND CARE INSTRUCTIONS (if applicable):  Keep a dry clean dressing on the anesthesia/puncture wound site if there is drainage.  Once the wound has quit draining you may leave it open to air.  Generally you should leave the bandage intact for twenty four hours unless there is drainage.  If the epidural site drains for more than 36-48 hours please call the anesthesia department.  QUESTIONS?:  Please feel free to call your physician or the hospital operator if you have any questions, and they will be happy to assist you.

## 2011-10-25 ENCOUNTER — Encounter (HOSPITAL_COMMUNITY): Payer: Self-pay

## 2011-10-25 ENCOUNTER — Encounter (HOSPITAL_COMMUNITY)
Admission: RE | Admit: 2011-10-25 | Discharge: 2011-10-25 | Disposition: A | Discharge status: Home / Self Care | Payer: Medicare Other | Source: Ambulatory Visit | Attending: Ophthalmology | Admitting: Ophthalmology

## 2011-10-25 ENCOUNTER — Other Ambulatory Visit: Payer: Self-pay

## 2011-10-25 DIAGNOSIS — Z01810 Encounter for preprocedural cardiovascular examination: Secondary | ICD-10-CM | POA: Diagnosis not present

## 2011-10-25 DIAGNOSIS — Z01812 Encounter for preprocedural laboratory examination: Secondary | ICD-10-CM | POA: Diagnosis not present

## 2011-10-25 DIAGNOSIS — H2589 Other age-related cataract: Secondary | ICD-10-CM | POA: Diagnosis not present

## 2011-10-25 LAB — BASIC METABOLIC PANEL
Calcium: 9.3 mg/dL (ref 8.4–10.5)
GFR calc Af Amer: >90 mL/min (ref >90)
GFR calc non Af Amer: 86 mL/min — ABNORMAL LOW (ref >90)
Glucose, Bld: 98 mg/dL (ref 70–99)
Potassium: 4.2 mEq/L (ref 3.5–5.1)
Sodium: 139 mEq/L (ref 135–145)

## 2011-10-25 LAB — HEMOGLOBIN AND HEMATOCRIT, BLOOD
HCT: 37.8 % (ref 36.0–46.0)
Hemoglobin: 12.9 g/dL (ref 12.0–15.0)

## 2011-10-27 MED ORDER — TETRACAINE HCL 0.5 % OP SOLN
OPHTHALMIC | Status: AC
  Administered 2011-10-28: 1 [drp] via OPHTHALMIC
  Filled 2011-10-27: qty 2

## 2011-10-27 MED ORDER — LIDOCAINE HCL 3.5 % OP GEL
OPHTHALMIC | Status: AC
  Administered 2011-10-28: 1 via OPHTHALMIC
  Filled 2011-10-27: qty 5

## 2011-10-27 MED ORDER — NEOMYCIN-POLYMYXIN-DEXAMETH 3.5-10000-0.1 OP OINT
TOPICAL_OINTMENT | OPHTHALMIC | Status: AC
  Filled 2011-10-27: qty 3.5

## 2011-10-27 MED ORDER — LIDOCAINE HCL (PF) 1 % IJ SOLN
INTRAMUSCULAR | Status: AC
  Filled 2011-10-27: qty 2

## 2011-10-27 MED ORDER — PHENYLEPHRINE HCL 2.5 % OP SOLN
OPHTHALMIC | Status: AC
  Administered 2011-10-28: 1 [drp] via OPHTHALMIC
  Filled 2011-10-27: qty 2

## 2011-10-27 MED ORDER — CYCLOPENTOLATE-PHENYLEPHRINE 0.2-1 % OP SOLN
OPHTHALMIC | Status: AC
  Administered 2011-10-28: 1 [drp] via OPHTHALMIC
  Filled 2011-10-27: qty 2

## 2011-10-28 ENCOUNTER — Ambulatory Visit (HOSPITAL_COMMUNITY)
Admission: RE | Admit: 2011-10-28 | Discharge: 2011-10-28 | Disposition: A | Discharge status: Home / Self Care | Payer: Medicare Other | Source: Ambulatory Visit | Attending: Ophthalmology | Admitting: Ophthalmology

## 2011-10-28 ENCOUNTER — Encounter (HOSPITAL_COMMUNITY)
Admission: RE | Disposition: A | Discharge status: Home / Self Care | Payer: Self-pay | Source: Ambulatory Visit | Attending: Ophthalmology

## 2011-10-28 ENCOUNTER — Ambulatory Visit (HOSPITAL_COMMUNITY): Payer: Medicare Other | Admitting: Anesthesiology

## 2011-10-28 ENCOUNTER — Encounter (HOSPITAL_COMMUNITY): Payer: Self-pay | Admitting: Anesthesiology

## 2011-10-28 ENCOUNTER — Encounter (HOSPITAL_COMMUNITY): Payer: Self-pay | Admitting: *Deleted

## 2011-10-28 DIAGNOSIS — Z01810 Encounter for preprocedural cardiovascular examination: Secondary | ICD-10-CM | POA: Insufficient documentation

## 2011-10-28 DIAGNOSIS — H2510 Age-related nuclear cataract, unspecified eye: Secondary | ICD-10-CM | POA: Diagnosis not present

## 2011-10-28 DIAGNOSIS — H2589 Other age-related cataract: Secondary | ICD-10-CM | POA: Diagnosis not present

## 2011-10-28 DIAGNOSIS — Z01812 Encounter for preprocedural laboratory examination: Secondary | ICD-10-CM | POA: Diagnosis not present

## 2011-10-28 DIAGNOSIS — H269 Unspecified cataract: Secondary | ICD-10-CM | POA: Diagnosis not present

## 2011-10-28 HISTORY — PX: CATARACT EXTRACTION W/PHACO: SHX586

## 2011-10-28 SURGERY — PHACOEMULSIFICATION, CATARACT, WITH IOL INSERTION
Anesthesia: Monitor Anesthesia Care | Site: Eye | Laterality: Right | Wound class: Clean

## 2011-10-28 MED ORDER — PHENYLEPHRINE HCL 2.5 % OP SOLN
1.0000 [drp] | OPHTHALMIC | Status: AC
  Administered 2011-10-28 (×3): 1 [drp] via OPHTHALMIC

## 2011-10-28 MED ORDER — BSS IO SOLN
INTRAOCULAR | Status: DC | PRN
  Administered 2011-10-28: 15 mL via INTRAOCULAR

## 2011-10-28 MED ORDER — LIDOCAINE HCL 3.5 % OP GEL
1.0000 "application " | Freq: Once | OPHTHALMIC | Status: AC
  Administered 2011-10-28: 1 via OPHTHALMIC

## 2011-10-28 MED ORDER — TETRACAINE HCL 0.5 % OP SOLN
1.0000 [drp] | OPHTHALMIC | Status: AC
  Administered 2011-10-28 (×3): 1 [drp] via OPHTHALMIC

## 2011-10-28 MED ORDER — LACTATED RINGERS IV SOLN
INTRAVENOUS | Status: DC
  Administered 2011-10-28: 1000 mL via INTRAVENOUS

## 2011-10-28 MED ORDER — CYCLOPENTOLATE-PHENYLEPHRINE 0.2-1 % OP SOLN
1.0000 [drp] | OPHTHALMIC | Status: AC
  Administered 2011-10-28 (×3): 1 [drp] via OPHTHALMIC

## 2011-10-28 MED ORDER — EPINEPHRINE HCL 1 MG/ML IJ SOLN
INTRAMUSCULAR | Status: AC
  Filled 2011-10-28: qty 1

## 2011-10-28 MED ORDER — MIDAZOLAM HCL 2 MG/2ML IJ SOLN
1.0000 mg | INTRAMUSCULAR | Status: DC | PRN
  Administered 2011-10-28: 2 mg via INTRAVENOUS

## 2011-10-28 MED ORDER — FENTANYL CITRATE 0.05 MG/ML IJ SOLN: 25.0000 ug | INTRAMUSCULAR | Status: DC | PRN

## 2011-10-28 MED ORDER — NEOMYCIN-POLYMYXIN-DEXAMETH 0.1 % OP OINT
TOPICAL_OINTMENT | OPHTHALMIC | Status: DC | PRN
  Administered 2011-10-28: 1 via OPHTHALMIC

## 2011-10-28 MED ORDER — POVIDONE-IODINE 5 % OP SOLN
OPHTHALMIC | Status: DC | PRN
  Administered 2011-10-28: 1 via OPHTHALMIC

## 2011-10-28 MED ORDER — PROVISC 10 MG/ML IO SOLN
INTRAOCULAR | Status: DC | PRN
  Administered 2011-10-28: 8.5 mg via INTRAOCULAR

## 2011-10-28 MED ORDER — MIDAZOLAM HCL 2 MG/2ML IJ SOLN
INTRAMUSCULAR | Status: AC
  Administered 2011-10-28: 2 mg via INTRAVENOUS
  Filled 2011-10-28: qty 2

## 2011-10-28 MED ORDER — ONDANSETRON HCL 4 MG/2ML IJ SOLN: 4.0000 mg | Freq: Once | INTRAMUSCULAR | Status: DC | PRN

## 2011-10-28 MED ORDER — LIDOCAINE 3.5 % OP GEL OPTIME - NO CHARGE
OPHTHALMIC | Status: DC | PRN
  Administered 2011-10-28: 2 [drp] via OPHTHALMIC

## 2011-10-28 MED ORDER — LIDOCAINE HCL (PF) 1 % IJ SOLN
INTRAMUSCULAR | Status: DC | PRN
  Administered 2011-10-28: .5 mL

## 2011-10-28 MED ORDER — EPINEPHRINE HCL 1 MG/ML IJ SOLN
INTRAOCULAR | Status: DC | PRN
  Administered 2011-10-28: 08:00:00

## 2011-10-28 SURGICAL SUPPLY — 32 items

## 2011-10-28 NOTE — Discharge Instructions (Signed)
Jenna [name]  10/28/2011     Instructions  1. Use medications as Instructed.  Shake well before use. Wait 5 minutes between drops.  {OPHTHALMIC ANTIBIOTICS:22167} 4 times a day x 1 week.  {OPHTHALMIC ANTI-INFLAMMATORY:22168} 2 times a day x 4 weeks.  {OPHTHALMIC STEROID:22169} 4 times a day - week 1   3 times a day - Week 2, 2 times a day- Week 3, 1 time a day - Week 4.  2. Do not rub the operative eye. Do not swim underwater for 2 weeks.  3. You may remove the clear shield and resume your normal activities the day after  Surgery. Your eyes may feel more comfortable if you wear dark glasses outside.  4. Call our office at [phone] if you have sudden change in vision, extreme redness or pain. Some fluctuation in vision is normal after surgery. If you have an emergency after hours, call Dr. Alto Denver at [phone].  5. It is important that you attend all of your follow-up appointments.        Follow-up:{follow up:32580} with [name], MD.   Dr. Lahoma Crocker: [phone]  Dr. Lita Mains: [phone]  Dr. Alto Denver: [phone]   If you find that you cannot contact your physician, but feel that your signs and   Symptoms warrant a physician's attention, call the Emergency Room at   [phone].   Other{NA AND WUXLKGMW:10272}.

## 2011-10-28 NOTE — Anesthesia Postprocedure Evaluation (Signed)
  Anesthesia Post-op Note  Patient: Jenna [name]  Procedure(s) Performed: Procedure(s) (LRB): CATARACT EXTRACTION PHACO AND INTRAOCULAR LENS PLACEMENT (IOC) (Right)  Patient Location: PACU and Short Stay  Anesthesia Type: MAC  Level of Consciousness: awake, alert , oriented and patient cooperative  Airway and Oxygen Therapy: Patient Spontanous Breathing  Post-op Pain: none  Post-op Assessment: Post-op Vital signs reviewed, Patient's Cardiovascular Status Stable, Respiratory Function Stable, Patent Airway and No signs of Nausea or vomiting  Post-op Vital Signs: Reviewed and stable  Complications: No apparent anesthesia complications

## 2011-10-28 NOTE — H&P (Signed)
I have reviewed the H&P, the patient was re-examined, and I have identified no interval changes in medical condition and plan of care since the history and physical of record  

## 2011-10-28 NOTE — Op Note (Signed)
[name], [name]              ACCOUNT NO.:  [id number]  MEDICAL RECORD NO.:  [id number]  LOCATION:  [facility]PO                          FACILITY:  [facility]  PHYSICIAN:  Susanne Greenhouse, MD       DATE OF BIRTH:  03/28/1936  DATE OF PROCEDURE:  10/28/2011 DATE OF DISCHARGE:  10/28/2011                              OPERATIVE REPORT   PREOPERATIVE DIAGNOSIS:  Combined cataract, right eye, diagnosis code 366.19.  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  OPERATION PERFORMED:  Phacoemulsification with posterior chamber intraocular lens implantation, right eye.  SURGEON:  Bonne Dolores. Alto Denver, MD  ANESTHESIA:  Local with IV sedation.  OPERATIVE SUMMARY:  In the preoperative area, dilating drops were placed into the right eye.  The patient was then brought into the operating room where she was placed under general anesthesia.  The eye was then prepped and draped.  Beginning with a 75 blade, a paracentesis port was made at the surgeon's 2 o'clock position.  The anterior chamber was then filled with a 1% nonpreserved lidocaine solution with epinephrine.  This was followed by Viscoat to deepen the chamber.  A small fornix-based peritomy was performed superiorly.  Next, a single iris hook was placed through the limbus superiorly.  A 2.4-mm keratome blade was then used to make a clear corneal incision over the iris hook.  A bent cystotome needle and Utrata forceps were used to create a continuous tear capsulotomy.  Hydrodissection was performed using balanced salt solution on a fine cannula.  The lens nucleus was then removed using phacoemulsification in a quadrant cracking technique.  The cortical material was then removed with irrigation and aspiration.  The capsular bag and anterior chamber were refilled with Provisc.  The wound was widened to approximately 3 mm and a posterior chamber intraocular lens was placed into the capsular bag without difficulty using an  [employer] lens injecting system.  A single 10-0 nylon suture was then used to close the incision as well as stromal hydration.  The Provisc was removed from the anterior chamber and capsular bag with irrigation and aspiration.  At this point, the wounds were tested for leak, which were negative.  The anterior chamber remained deep and stable.  The patient tolerated the procedure well.  There were no operative complications, and she awoke from general anesthesia without problem.  No surgical specimens.  Prosthetic device used is a Lenstec posterior chamber lens, model Softec HD, power of 21.25, serial number is [id number].          ______________________________ Susanne Greenhouse, MD     KEH/MEDQ  D:  10/28/2011  T:  10/28/2011  Job:  [id number]

## 2011-10-28 NOTE — Anesthesia Preprocedure Evaluation (Signed)
Anesthesia Evaluation  Patient identified by MRN, date of birth, ID band Patient awake    Reviewed: Allergy & Precautions, H&P , NPO status , Patient's Chart, lab work & pertinent test results, reviewed documented beta blocker date and time   Airway Mallampati: II      Dental  (+) Teeth Intact   Pulmonary neg pulmonary ROS,  breath sounds clear to auscultation        Cardiovascular + dysrhythmias Atrial Fibrillation Rhythm:Regular Rate:Normal     Neuro/Psych    GI/Hepatic PUD,   Endo/Other  Hypothyroidism   Renal/GU      Musculoskeletal  (+) Fibromyalgia -  Abdominal   Peds  Hematology   Anesthesia Other Findings   Reproductive/Obstetrics                           Anesthesia Physical Anesthesia Plan  ASA: III  Anesthesia Plan: MAC   Post-op Pain Management:    Induction: Intravenous  Airway Management Planned: Nasal Cannula  Additional Equipment:   Intra-op Plan:   Post-operative Plan:   Informed Consent: I have reviewed the patients History and Physical, chart, labs and discussed the procedure including the risks, benefits and alternatives for the proposed anesthesia with the patient or authorized representative who has indicated his/her understanding and acceptance.     Plan Discussed with:   Anesthesia Plan Comments:         Anesthesia Quick Evaluation  

## 2011-10-28 NOTE — Brief Op Note (Signed)
Pre-Op Dx: Cataract OD Post-Op Dx: Cataract OD Surgeon: Onofre Gains Anesthesia: Topical with MAC Surgery: Cataract Extraction with Intraocular lens Implant OD Implant: Lenstec, Model Softec HD Blood Loss: None Specimen: None Complications: None 

## 2011-10-28 NOTE — Transfer of Care (Signed)
Immediate Anesthesia Transfer of Care Note  Patient: Jenna [name]  Procedure(s) Performed: Procedure(s) (LRB): CATARACT EXTRACTION PHACO AND INTRAOCULAR LENS PLACEMENT (IOC) (Right)  Patient Location: PACU and Short Stay  Anesthesia Type: MAC  Level of Consciousness: awake, alert , oriented and patient cooperative  Airway & Oxygen Therapy: Patient Spontanous Breathing  Post-op Assessment: Report given to PACU RN, Post -op Vital signs reviewed and stable and Patient moving all extremities  Post vital signs: Reviewed and stable  Complications: No apparent anesthesia complications

## 2011-10-29 ENCOUNTER — Encounter (HOSPITAL_COMMUNITY): Payer: Self-pay | Admitting: Ophthalmology

## 2011-11-11 DIAGNOSIS — R7989 Other specified abnormal findings of blood chemistry: Secondary | ICD-10-CM | POA: Diagnosis not present

## 2011-11-11 DIAGNOSIS — R002 Palpitations: Secondary | ICD-10-CM | POA: Diagnosis not present

## 2011-11-11 DIAGNOSIS — M6250 Muscle wasting and atrophy, not elsewhere classified, unspecified site: Secondary | ICD-10-CM | POA: Diagnosis not present

## 2011-11-17 DIAGNOSIS — M109 Gout, unspecified: Secondary | ICD-10-CM | POA: Diagnosis not present

## 2011-11-17 DIAGNOSIS — Z79899 Other long term (current) drug therapy: Secondary | ICD-10-CM | POA: Diagnosis not present

## 2011-12-27 ENCOUNTER — Ambulatory Visit: Payer: Medicare Other | Admitting: Cardiology

## 2012-01-20 ENCOUNTER — Ambulatory Visit (INDEPENDENT_AMBULATORY_CARE_PROVIDER_SITE_OTHER): Payer: Medicare Other | Admitting: Cardiology

## 2012-01-20 ENCOUNTER — Encounter: Payer: Self-pay | Admitting: Cardiology

## 2012-01-20 VITALS — BP 120/70 | HR 63 | Ht 66.0 in | Wt 209.1 lb

## 2012-01-20 DIAGNOSIS — I4891 Unspecified atrial fibrillation: Secondary | ICD-10-CM | POA: Diagnosis not present

## 2012-01-20 NOTE — Progress Notes (Signed)
   Clinical Summary Ms. Vanallen is a 76 y.o.female presenting for followup. She was seen in February 2012. She report occasional palpitations, mainly in the evening. No limitation to her usual daily activities however.  ECG from June reviewed showing sinus rhythm with lead artifact.  We discussed thromboembolic risk with atrial fibrillation and indications for anticoagulation. She has done well on ASA so far, frankly has not had documented atrial fibrillation for quite some time.   Allergies  Allergen Reactions  . Cymbalta (Duloxetine Hcl) Nausea Only  . Daypro (Oxaprozin)   . Diclofenac Sodium   . Effexor (Venlafaxine Hydrochloride)   . Lipitor (Atorvastatin Calcium) Other (See Comments)    Leg aches  . Penicillins   . Prozac (Fluoxetine Hcl)   . Sulfa Antibiotics   . Zocor (Simvastatin) Other (See Comments)    Hip pain  . Amitriptyline Rash  . Milnacipran Hcl Rash    Current Outpatient Prescriptions  Medication Sig Dispense Refill  . Cholecalciferol (VITAMIN D3) 2000 UNITS TABS Take 1 tablet by mouth daily.        . fish oil-omega-3 fatty acids 1000 MG capsule Take 2 g by mouth daily.       [name] levothyroxine (SYNTHROID, LEVOTHROID) 75 MCG tablet Take 75 mcg by mouth daily.        . metoprolol (LOPRESSOR) 50 MG tablet TAKE 1 TABLET ONCE DAILY.  30 tablet  10  . PROCTOZONE-HC 2.5 % rectal cream Place 1 application rectally as needed.         Past Medical History  Diagnosis Date  . OA (osteoarthritis)   . Unspecified hypothyroidism   . Fibromyalgia   . Osteoporosis   . PUD (peptic ulcer disease)   . DDD (degenerative disc disease)   . Herpes zoster   . AF (atrial fibrillation)   . Cataract   . Hyperlipidemia   . Cancer     skin cancer    Social History Ms. Yontz reports that she has never smoked. She does not have any smokeless tobacco history on file. Ms. Goodness reports that she does not drink alcohol.  Review of Systems Negative except as outlined  above.  Physical Examination Filed Vitals:   01/20/12 1247  BP: 120/70  Pulse: 63    Overweight woman in no acute distress.  HEENT: Conjunctiva and lids normal, oropharynx with moist mucosa.  Neck: Supple, no carotid bruits or thyromegaly.  Lungs: Clear to auscultation, nonlabored.  Cardiac: Regular rate and rhythm, no S3.  Extremities: No pitting edema.   Problem List and Plan   ATRIAL FIBRILLATION Paroxysmal and relatively quiescent for some time. Continue ASA and Lopressor for now. Would obtain a followup monitor if she has progressive palpitations, particularly prior to initiating anticoagulation.    Jonelle Sidle, M.D., F.A.C.C.

## 2012-01-20 NOTE — Patient Instructions (Addendum)
Your physician recommends that you schedule a follow-up appointment in: 1 year  

## 2012-01-20 NOTE — Assessment & Plan Note (Signed)
Paroxysmal and relatively quiescent for some time. Continue ASA and Lopressor for now. Would obtain a followup monitor if she has progressive palpitations, particularly prior to initiating anticoagulation.

## 2012-01-31 DIAGNOSIS — Z961 Presence of intraocular lens: Secondary | ICD-10-CM | POA: Diagnosis not present

## 2012-01-31 DIAGNOSIS — H2589 Other age-related cataract: Secondary | ICD-10-CM | POA: Diagnosis not present

## 2012-02-01 DIAGNOSIS — Z124 Encounter for screening for malignant neoplasm of cervix: Secondary | ICD-10-CM | POA: Diagnosis not present

## 2012-02-03 ENCOUNTER — Encounter (HOSPITAL_COMMUNITY): Payer: Self-pay | Admitting: Pharmacy Technician

## 2012-02-10 ENCOUNTER — Encounter (HOSPITAL_COMMUNITY)
Admission: RE | Admit: 2012-02-10 | Discharge: 2012-02-10 | Payer: Medicare Other | Source: Ambulatory Visit | Admitting: Ophthalmology

## 2012-02-10 NOTE — Patient Instructions (Addendum)
Your procedure is scheduled on: 02/14/2012  Report to [facility] at  1000     AM.  Call this number if you have problems the morning of surgery: [phone]   Do not eat food or drink liquids :After Midnight.      Take these medicines the morning of surgery with A SIP OF WATER:metoprolol,levothyroxine   Do not wear jewelry, make-up or nail polish.  Do not wear lotions, powders, or perfumes. You may wear deodorant.  Do not shave 48 hours prior to surgery.  Do not bring valuables to the hospital.  Contacts, dentures or bridgework may not be worn into surgery.  Leave suitcase in the car. After surgery it may be brought to your room.  For patients admitted to the hospital, checkout time is 11:00 AM the day of discharge.   Patients discharged the day of surgery will not be allowed to drive home.  :     Please read over the following fact sheets that you were given: Coughing and Deep Breathing, Surgical Site Infection Prevention, Anesthesia Post-op Instructions and Care and Recovery After Surgery    Cataract A cataract is a clouding of the lens of the eye. When a lens becomes cloudy, vision is reduced based on the degree and nature of the clouding. Many cataracts reduce vision to some degree. Some cataracts make people more near-sighted as they develop. Other cataracts increase glare. Cataracts that are ignored and become worse can sometimes look white. The white color can be seen through the pupil. CAUSES   Aging. However, cataracts may occur at any age, even in newborns.   Certain drugs.   Trauma to the eye.   Certain diseases such as diabetes.   Specific eye diseases such as chronic inflammation inside the eye or a sudden attack of a rare form of glaucoma.   Inherited or acquired medical problems.  SYMPTOMS   Gradual, progressive drop in vision in the affected eye.   Severe, rapid visual loss. This most often happens when trauma is the cause.  DIAGNOSIS  To detect a cataract, an  eye doctor examines the lens. Cataracts are best diagnosed with an exam of the eyes with the pupils enlarged (dilated) by drops.  TREATMENT  For an early cataract, vision may improve by using different eyeglasses or stronger lighting. If that does not help your vision, surgery is the only effective treatment. A cataract needs to be surgically removed when vision loss interferes with your everyday activities, such as driving, reading, or watching TV. A cataract may also have to be removed if it prevents examination or treatment of another eye problem. Surgery removes the cloudy lens and usually replaces it with a substitute lens (intraocular lens, IOL).  At a time when both you and your doctor agree, the cataract will be surgically removed. If you have cataracts in both eyes, only one is usually removed at a time. This allows the operated eye to heal and be out of danger from any possible problems after surgery (such as infection or poor wound healing). In rare cases, a cataract may be doing damage to your eye. In these cases, your caregiver may advise surgical removal right away. The vast majority of people who have cataract surgery have better vision afterward. HOME CARE INSTRUCTIONS  If you are not planning surgery, you may be asked to do the following:  Use different eyeglasses.   Use stronger or brighter lighting.   Ask your eye doctor about reducing your medicine  dose or changing medicines if it is thought that a medicine caused your cataract. Changing medicines does not make the cataract go away on its own.   Become familiar with your surroundings. Poor vision can lead to injury. Avoid bumping into things on the affected side. You are at a higher risk for tripping or falling.   Exercise extreme care when driving or operating machinery.   Wear sunglasses if you are sensitive to bright light or experiencing problems with glare.  SEEK IMMEDIATE MEDICAL CARE IF:   You have a worsening or sudden  vision loss.   You notice redness, swelling, or increasing pain in the eye.   You have a fever.  Document Released: 05/10/2005 Document Revised: 04/29/2011 Document Reviewed: 01/01/2011 [facility] Patient Information 2012 [place], [place].PATIENT INSTRUCTIONS POST-ANESTHESIA  IMMEDIATELY FOLLOWING SURGERY:  Do not drive or operate machinery for the first twenty four hours after surgery.  Do not make any important decisions for twenty four hours after surgery or while taking narcotic pain medications or sedatives.  If you develop intractable nausea and vomiting or a severe headache please notify your doctor immediately.  FOLLOW-UP:  Please make an appointment with your surgeon as instructed. You do not need to follow up with anesthesia unless specifically instructed to do so.  WOUND CARE INSTRUCTIONS (if applicable):  Keep a dry clean dressing on the anesthesia/puncture wound site if there is drainage.  Once the wound has quit draining you may leave it open to air.  Generally you should leave the bandage intact for twenty four hours unless there is drainage.  If the epidural site drains for more than 36-48 hours please call the anesthesia department.  QUESTIONS?:  Please feel free to call your physician or the hospital operator if you have any questions, and they will be happy to assist you.

## 2012-02-11 MED ORDER — NEOMYCIN-POLYMYXIN-DEXAMETH 3.5-10000-0.1 OP OINT
TOPICAL_OINTMENT | OPHTHALMIC | Status: AC
  Filled 2012-02-11: qty 3.5

## 2012-02-11 MED ORDER — TETRACAINE HCL 0.5 % OP SOLN
OPHTHALMIC | Status: AC
  Filled 2012-02-11: qty 2

## 2012-02-11 MED ORDER — PHENYLEPHRINE HCL 2.5 % OP SOLN
OPHTHALMIC | Status: AC
  Filled 2012-02-11: qty 2

## 2012-02-11 MED ORDER — LIDOCAINE HCL (PF) 1 % IJ SOLN
INTRAMUSCULAR | Status: AC
  Filled 2012-02-11: qty 2

## 2012-02-11 MED ORDER — LIDOCAINE HCL 3.5 % OP GEL
OPHTHALMIC | Status: AC
  Filled 2012-02-11: qty 5

## 2012-02-14 ENCOUNTER — Encounter (HOSPITAL_COMMUNITY): Payer: Self-pay | Admitting: *Deleted

## 2012-02-14 ENCOUNTER — Encounter (HOSPITAL_COMMUNITY): Payer: Self-pay | Admitting: Anesthesiology

## 2012-02-14 ENCOUNTER — Ambulatory Visit (HOSPITAL_COMMUNITY)
Admission: RE | Admit: 2012-02-14 | Discharge: 2012-02-14 | Disposition: A | Discharge status: Home / Self Care | Payer: Medicare Other | Source: Ambulatory Visit | Attending: Ophthalmology | Admitting: Ophthalmology

## 2012-02-14 ENCOUNTER — Encounter (HOSPITAL_COMMUNITY)
Admission: RE | Disposition: A | Discharge status: Home / Self Care | Payer: Self-pay | Source: Ambulatory Visit | Attending: Ophthalmology

## 2012-02-14 ENCOUNTER — Ambulatory Visit (HOSPITAL_COMMUNITY): Payer: Medicare Other | Admitting: Anesthesiology

## 2012-02-14 DIAGNOSIS — H2589 Other age-related cataract: Secondary | ICD-10-CM | POA: Insufficient documentation

## 2012-02-14 DIAGNOSIS — I10 Essential (primary) hypertension: Secondary | ICD-10-CM | POA: Insufficient documentation

## 2012-02-14 DIAGNOSIS — H269 Unspecified cataract: Secondary | ICD-10-CM | POA: Diagnosis not present

## 2012-02-14 DIAGNOSIS — H2510 Age-related nuclear cataract, unspecified eye: Secondary | ICD-10-CM | POA: Diagnosis not present

## 2012-02-14 HISTORY — PX: CATARACT EXTRACTION W/PHACO: SHX586

## 2012-02-14 SURGERY — PHACOEMULSIFICATION, CATARACT, WITH IOL INSERTION
Anesthesia: Monitor Anesthesia Care | Site: Eye | Laterality: Left | Wound class: Clean

## 2012-02-14 MED ORDER — BSS IO SOLN
INTRAOCULAR | Status: DC | PRN
  Administered 2012-02-14: 15 mL via INTRAOCULAR

## 2012-02-14 MED ORDER — LIDOCAINE HCL 3.5 % OP GEL
1.0000 "application " | Freq: Once | OPHTHALMIC | Status: AC
  Administered 2012-02-14: 1 via OPHTHALMIC

## 2012-02-14 MED ORDER — EPINEPHRINE HCL 1 MG/ML IJ SOLN
INTRAOCULAR | Status: DC | PRN
  Administered 2012-02-14: 12:00:00

## 2012-02-14 MED ORDER — LACTATED RINGERS IV SOLN
INTRAVENOUS | Status: DC
  Administered 2012-02-14: 11:00:00 via INTRAVENOUS

## 2012-02-14 MED ORDER — MIDAZOLAM HCL 2 MG/2ML IJ SOLN
1.0000 mg | INTRAMUSCULAR | Status: DC | PRN
  Administered 2012-02-14 (×2): 2 mg via INTRAVENOUS

## 2012-02-14 MED ORDER — LIDOCAINE HCL (PF) 1 % IJ SOLN
INTRAMUSCULAR | Status: DC | PRN
  Administered 2012-02-14: .3 mL

## 2012-02-14 MED ORDER — TETRACAINE HCL 0.5 % OP SOLN
1.0000 [drp] | OPHTHALMIC | Status: AC
  Administered 2012-02-14 (×3): 1 [drp] via OPHTHALMIC

## 2012-02-14 MED ORDER — MIDAZOLAM HCL 2 MG/2ML IJ SOLN
INTRAMUSCULAR | Status: AC
  Filled 2012-02-14: qty 2

## 2012-02-14 MED ORDER — LIDOCAINE 3.5 % OP GEL OPTIME - NO CHARGE
OPHTHALMIC | Status: DC | PRN
  Administered 2012-02-14: 2 [drp] via OPHTHALMIC

## 2012-02-14 MED ORDER — NEOMYCIN-POLYMYXIN-DEXAMETH 0.1 % OP OINT
TOPICAL_OINTMENT | OPHTHALMIC | Status: DC | PRN
  Administered 2012-02-14: 1 via OPHTHALMIC

## 2012-02-14 MED ORDER — EPINEPHRINE HCL 1 MG/ML IJ SOLN
INTRAMUSCULAR | Status: AC
  Filled 2012-02-14: qty 1

## 2012-02-14 MED ORDER — POVIDONE-IODINE 5 % OP SOLN
OPHTHALMIC | Status: DC | PRN
  Administered 2012-02-14: 1 via OPHTHALMIC

## 2012-02-14 MED ORDER — PHENYLEPHRINE HCL 2.5 % OP SOLN
1.0000 [drp] | OPHTHALMIC | Status: AC
  Administered 2012-02-14 (×3): 1 [drp] via OPHTHALMIC

## 2012-02-14 MED ORDER — CYCLOPENTOLATE HCL 1 % OP SOLN
1.0000 [drp] | OPHTHALMIC | Status: AC
  Administered 2012-02-14 (×3): 1 [drp] via OPHTHALMIC

## 2012-02-14 MED ORDER — PROVISC 10 MG/ML IO SOLN
INTRAOCULAR | Status: DC | PRN
  Administered 2012-02-14: 8.5 mg via INTRAOCULAR

## 2012-02-14 SURGICAL SUPPLY — 32 items

## 2012-02-14 NOTE — Anesthesia Postprocedure Evaluation (Signed)
  Anesthesia Post-op Note  Patient: Jenna [name]  Procedure(s) Performed: Procedure(s) (LRB) with comments: CATARACT EXTRACTION PHACO AND INTRAOCULAR LENS PLACEMENT (IOC) (Left) - CDE 17.20  Patient Location: PACU  Anesthesia Type: MAC  Level of Consciousness: awake, alert  and oriented  Airway and Oxygen Therapy: Patient Spontanous Breathing  Post-op Pain: none  Post-op Assessment: Post-op Vital signs reviewed, Patient's Cardiovascular Status Stable, Respiratory Function Stable, Patent Airway and No signs of Nausea or vomiting  Post-op Vital Signs: Reviewed and stable  Complications: No apparent anesthesia complications

## 2012-02-14 NOTE — Brief Op Note (Signed)
Pre-Op Dx: Cataract OS Post-Op Dx: Cataract OS Surgeon: Donn Wilmot Anesthesia: Topical with MAC Surgery: Cataract Extraction with Intraocular lens Implant OS Implant: Lenstec, Model Softec HD Specimen: None Complications: None 

## 2012-02-14 NOTE — H&P (Signed)
I have reviewed the H&P, the patient was re-examined, and I have identified no interval changes in medical condition and plan of care since the history and physical of record  

## 2012-02-14 NOTE — Anesthesia Preprocedure Evaluation (Signed)
Anesthesia Evaluation  Patient identified by MRN, date of birth, ID band Patient awake    Reviewed: Allergy & Precautions, H&P , NPO status , Patient's Chart, lab work & pertinent test results, reviewed documented beta blocker date and time   Airway Mallampati: II      Dental  (+) Teeth Intact   Pulmonary neg pulmonary ROS,  breath sounds clear to auscultation        Cardiovascular + dysrhythmias Atrial Fibrillation Rhythm:Regular Rate:Normal     Neuro/Psych    GI/Hepatic PUD,   Endo/Other  Hypothyroidism   Renal/GU      Musculoskeletal  (+) Fibromyalgia -  Abdominal   Peds  Hematology   Anesthesia Other Findings   Reproductive/Obstetrics                           Anesthesia Physical Anesthesia Plan  ASA: III  Anesthesia Plan: MAC   Post-op Pain Management:    Induction: Intravenous  Airway Management Planned: Nasal Cannula  Additional Equipment:   Intra-op Plan:   Post-operative Plan:   Informed Consent: I have reviewed the patients History and Physical, chart, labs and discussed the procedure including the risks, benefits and alternatives for the proposed anesthesia with the patient or authorized representative who has indicated his/her understanding and acceptance.     Plan Discussed with:   Anesthesia Plan Comments:         Anesthesia Quick Evaluation

## 2012-02-14 NOTE — Transfer of Care (Signed)
Immediate Anesthesia Transfer of Care Note  Patient: Jenna [name]  Procedure(s) Performed: Procedure(s) (LRB) with comments: CATARACT EXTRACTION PHACO AND INTRAOCULAR LENS PLACEMENT (IOC) (Left) - CDE 17.20  Patient Location: Short Stay  Anesthesia Type: MAC  Level of Consciousness: awake  Airway & Oxygen Therapy: Patient Spontanous Breathing  Post-op Assessment: Report given to PACU RN  Post vital signs: Reviewed  Complications: No apparent anesthesia complications

## 2012-02-15 ENCOUNTER — Encounter (HOSPITAL_COMMUNITY): Payer: Self-pay | Admitting: Ophthalmology

## 2012-02-15 NOTE — Op Note (Signed)
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  POSTOPERATIVE DIAGNOSIS:  Combined cataract, left eye, diagnosis code 366.19.  OPERATION PERFORMED:  Phacoemulsification with posterior chamber intraocular lens implantation, left eye.  SURGEON:  Bonne Dolores. Arlis Everly, MD  ANESTHESIA:  Topical with IV sedation.  OPERATIVE SUMMARY:  In the preoperative area, dilating drops were placed into the left eye.  The patient was then brought into the operating room where she was placed under general anesthesia.  The eye was then prepped and draped.  Beginning with a 75 blade, a paracentesis port was made at the surgeon's 2 o'clock position.  The anterior chamber was then filled with a 1% nonpreserved lidocaine solution with epinephrine.  This was followed by Viscoat to deepen the chamber.  A small fornix-based peritomy was performed superiorly.  Next, a single iris hook was placed through the limbus superiorly.  A 2.4-mm keratome blade was then used to make a clear corneal incision over the iris hook.  A bent cystotome needle and Utrata forceps were used to create a continuous tear capsulotomy.  Hydrodissection was performed using balanced salt solution on a fine cannula.  The lens nucleus was then removed using phacoemulsification in a quadrant cracking technique.  The cortical material was then removed with irrigation and aspiration.  The capsular bag and anterior chamber were refilled with Provisc.  The wound was widened to approximately 3 mm and a posterior chamber intraocular lens was placed into the capsular bag without difficulty using an  [employer] lens injecting system.  A single 10-0 nylon suture was then used to close the incision as well as stromal hydration.  The Provisc was removed from the anterior chamber and capsular bag with irrigation and aspiration.  At this point, the wounds were tested for leak, which were negative.  The anterior chamber remained deep and stable.  The patient tolerated the procedure well.  There were no operative complications, and she awoke from general anesthesia without problem.  No surgical specimens.  PROSTHETIC DEVICE USED:  A Lenstec posterior chamber lens, model Softec HD, power of 23.0, serial number is [id number].          ______________________________ Susanne Greenhouse, MD     KEH/MEDQ  D:  02/14/2012  T:  02/15/2012  Job:  [id number]

## 2012-05-01 DIAGNOSIS — I10 Essential (primary) hypertension: Secondary | ICD-10-CM | POA: Diagnosis not present

## 2012-05-01 DIAGNOSIS — E559 Vitamin D deficiency, unspecified: Secondary | ICD-10-CM | POA: Diagnosis not present

## 2012-05-01 DIAGNOSIS — E785 Hyperlipidemia, unspecified: Secondary | ICD-10-CM | POA: Diagnosis not present

## 2012-05-26 DIAGNOSIS — R059 Cough, unspecified: Secondary | ICD-10-CM | POA: Diagnosis not present

## 2012-05-26 DIAGNOSIS — J209 Acute bronchitis, unspecified: Secondary | ICD-10-CM | POA: Diagnosis not present

## 2012-05-26 DIAGNOSIS — J189 Pneumonia, unspecified organism: Secondary | ICD-10-CM | POA: Diagnosis not present

## 2012-05-28 ENCOUNTER — Emergency Department (HOSPITAL_COMMUNITY): Payer: Medicare Other

## 2012-05-28 ENCOUNTER — Encounter (HOSPITAL_COMMUNITY): Payer: Self-pay | Admitting: *Deleted

## 2012-05-28 ENCOUNTER — Inpatient Hospital Stay (HOSPITAL_COMMUNITY)
Admission: EM | Admit: 2012-05-28 | Discharge: 2012-05-30 | DRG: 189 | Disposition: A | Discharge status: Home / Self Care | Payer: Medicare Other | Attending: Internal Medicine | Admitting: Internal Medicine

## 2012-05-28 DIAGNOSIS — Z886 Allergy status to analgesic agent status: Secondary | ICD-10-CM

## 2012-05-28 DIAGNOSIS — Z888 Allergy status to other drugs, medicaments and biological substances status: Secondary | ICD-10-CM

## 2012-05-28 DIAGNOSIS — T50905A Adverse effect of unspecified drugs, medicaments and biological substances, initial encounter: Secondary | ICD-10-CM

## 2012-05-28 DIAGNOSIS — M810 Age-related osteoporosis without current pathological fracture: Secondary | ICD-10-CM | POA: Diagnosis present

## 2012-05-28 DIAGNOSIS — Z7982 Long term (current) use of aspirin: Secondary | ICD-10-CM | POA: Diagnosis not present

## 2012-05-28 DIAGNOSIS — IMO0001 Reserved for inherently not codable concepts without codable children: Secondary | ICD-10-CM | POA: Diagnosis present

## 2012-05-28 DIAGNOSIS — I4891 Unspecified atrial fibrillation: Secondary | ICD-10-CM

## 2012-05-28 DIAGNOSIS — R7309 Other abnormal glucose: Secondary | ICD-10-CM | POA: Diagnosis not present

## 2012-05-28 DIAGNOSIS — R059 Cough, unspecified: Secondary | ICD-10-CM | POA: Diagnosis not present

## 2012-05-28 DIAGNOSIS — E039 Hypothyroidism, unspecified: Secondary | ICD-10-CM | POA: Diagnosis not present

## 2012-05-28 DIAGNOSIS — IMO0002 Reserved for concepts with insufficient information to code with codable children: Secondary | ICD-10-CM | POA: Diagnosis present

## 2012-05-28 DIAGNOSIS — E785 Hyperlipidemia, unspecified: Secondary | ICD-10-CM | POA: Diagnosis present

## 2012-05-28 DIAGNOSIS — M797 Fibromyalgia: Secondary | ICD-10-CM

## 2012-05-28 DIAGNOSIS — R739 Hyperglycemia, unspecified: Secondary | ICD-10-CM | POA: Diagnosis not present

## 2012-05-28 DIAGNOSIS — Z882 Allergy status to sulfonamides status: Secondary | ICD-10-CM

## 2012-05-28 DIAGNOSIS — Z79899 Other long term (current) drug therapy: Secondary | ICD-10-CM

## 2012-05-28 DIAGNOSIS — J209 Acute bronchitis, unspecified: Secondary | ICD-10-CM | POA: Diagnosis present

## 2012-05-28 DIAGNOSIS — J9602 Acute respiratory failure with hypercapnia: Secondary | ICD-10-CM | POA: Diagnosis present

## 2012-05-28 DIAGNOSIS — Z85828 Personal history of other malignant neoplasm of skin: Secondary | ICD-10-CM

## 2012-05-28 DIAGNOSIS — K279 Peptic ulcer, site unspecified, unspecified as acute or chronic, without hemorrhage or perforation: Secondary | ICD-10-CM

## 2012-05-28 DIAGNOSIS — M1990 Unspecified osteoarthritis, unspecified site: Secondary | ICD-10-CM | POA: Diagnosis present

## 2012-05-28 DIAGNOSIS — E669 Obesity, unspecified: Secondary | ICD-10-CM | POA: Diagnosis present

## 2012-05-28 DIAGNOSIS — Z881 Allergy status to other antibiotic agents status: Secondary | ICD-10-CM | POA: Diagnosis not present

## 2012-05-28 DIAGNOSIS — Z6836 Body mass index (BMI) 36.0-36.9, adult: Secondary | ICD-10-CM

## 2012-05-28 DIAGNOSIS — T380X5A Adverse effect of glucocorticoids and synthetic analogues, initial encounter: Secondary | ICD-10-CM | POA: Diagnosis not present

## 2012-05-28 DIAGNOSIS — J9600 Acute respiratory failure, unspecified whether with hypoxia or hypercapnia: Principal | ICD-10-CM

## 2012-05-28 DIAGNOSIS — Z880 Allergy status to penicillin: Secondary | ICD-10-CM

## 2012-05-28 DIAGNOSIS — I480 Paroxysmal atrial fibrillation: Secondary | ICD-10-CM | POA: Diagnosis present

## 2012-05-28 LAB — BLOOD GAS, ARTERIAL
Acid-Base Excess: 11.8 mmol/L — ABNORMAL HIGH (ref 0.0–2.0)
Acid-base deficit: 10.5 mmol/L — ABNORMAL HIGH (ref 0.0–2.0)
Bicarbonate: 36.4 mEq/L — ABNORMAL HIGH (ref 20.0–24.0)
O2 Saturation: 95.8 %
TCO2: 30.7 mmol/L (ref 0–100)
pH, Arterial: 7.457 — ABNORMAL HIGH (ref 7.350–7.450)

## 2012-05-28 LAB — CBC WITH DIFFERENTIAL/PLATELET
Eosinophils Absolute: 0.1 10*3/uL (ref 0.0–0.7)
Eosinophils Relative: 1 % (ref 0–5)
HCT: 36.6 % (ref 36.0–46.0)
Lymphocytes Relative: 12 % (ref 12–46)
Lymphs Abs: 1.1 10*3/uL (ref 0.7–4.0)
MCH: 32.5 pg (ref 26.0–34.0)
MCV: 92.2 fL (ref 78.0–100.0)
Monocytes Absolute: 0.9 10*3/uL (ref 0.1–1.0)
Platelets: 191 10*3/uL (ref 150–400)
RBC: 3.97 MIL/uL (ref 3.87–5.11)
RDW: 12.1 % (ref 11.5–15.5)

## 2012-05-28 LAB — BASIC METABOLIC PANEL
CO2: 31 mEq/L (ref 19–32)
Calcium: 9 mg/dL (ref 8.4–10.5)
Creatinine, Ser: 0.67 mg/dL (ref 0.50–1.10)
GFR calc non Af Amer: 84 mL/min — ABNORMAL LOW (ref >90)
Glucose, Bld: 116 mg/dL — ABNORMAL HIGH (ref 70–99)
Sodium: 142 mEq/L (ref 135–145)

## 2012-05-28 LAB — INFLUENZA PANEL BY PCR (TYPE A & B): H1N1 flu by pcr: NOT DETECTED

## 2012-05-28 LAB — GLUCOSE, CAPILLARY: Glucose-Capillary: 166 mg/dL — ABNORMAL HIGH (ref 70–99)

## 2012-05-28 MED ORDER — LEVOFLOXACIN IN D5W 750 MG/150ML IV SOLN
750.0000 mg | INTRAVENOUS | Status: DC
  Administered 2012-05-29: 750 mg via INTRAVENOUS
  Filled 2012-05-28 (×2): qty 150

## 2012-05-28 MED ORDER — BENZONATATE 100 MG PO CAPS
100.0000 mg | ORAL_CAPSULE | Freq: Three times a day (TID) | ORAL | Status: DC
  Administered 2012-05-28 – 2012-05-30 (×6): 100 mg via ORAL
  Filled 2012-05-28 (×6): qty 1

## 2012-05-28 MED ORDER — METHYLPREDNISOLONE SODIUM SUCC 125 MG IJ SOLR
80.0000 mg | Freq: Three times a day (TID) | INTRAMUSCULAR | Status: DC
  Administered 2012-05-28 – 2012-05-30 (×5): 80 mg via INTRAVENOUS
  Filled 2012-05-28 (×5): qty 2

## 2012-05-28 MED ORDER — FAMOTIDINE 20 MG PO TABS
20.0000 mg | ORAL_TABLET | Freq: Every day | ORAL | Status: DC
  Administered 2012-05-29 – 2012-05-30 (×2): 20 mg via ORAL
  Filled 2012-05-28 (×2): qty 1

## 2012-05-28 MED ORDER — SODIUM CHLORIDE 0.9 % IV BOLUS (SEPSIS)
500.0000 mL | Freq: Once | INTRAVENOUS | Status: AC
  Administered 2012-05-28: 1000 mL via INTRAVENOUS

## 2012-05-28 MED ORDER — POTASSIUM CHLORIDE IN NACL 20-0.9 MEQ/L-% IV SOLN
INTRAVENOUS | Status: DC
  Administered 2012-05-28: 1000 mL via INTRAVENOUS
  Administered 2012-05-29 (×2): 50 mL/h via INTRAVENOUS

## 2012-05-28 MED ORDER — IPRATROPIUM BROMIDE 0.02 % IN SOLN
0.5000 mg | RESPIRATORY_TRACT | Status: DC
  Administered 2012-05-28 – 2012-05-30 (×8): 0.5 mg via RESPIRATORY_TRACT
  Filled 2012-05-28 (×8): qty 2.5

## 2012-05-28 MED ORDER — METOPROLOL TARTRATE 25 MG PO TABS
25.0000 mg | ORAL_TABLET | Freq: Two times a day (BID) | ORAL | Status: DC
  Administered 2012-05-29 – 2012-05-30 (×3): 25 mg via ORAL
  Filled 2012-05-28 (×3): qty 1

## 2012-05-28 MED ORDER — ALUM & MAG HYDROXIDE-SIMETH 200-200-20 MG/5ML PO SUSP: 30.0000 mL | Freq: Four times a day (QID) | ORAL | Status: DC | PRN

## 2012-05-28 MED ORDER — INSULIN ASPART 100 UNIT/ML ~~LOC~~ SOLN: 0.0000 [IU] | Freq: Every day | SUBCUTANEOUS | Status: DC

## 2012-05-28 MED ORDER — VITAMIN D3 25 MCG (1000 UNIT) PO TABS
2000.0000 [IU] | ORAL_TABLET | Freq: Every day | ORAL | Status: DC
  Administered 2012-05-29 – 2012-05-30 (×2): 2000 [IU] via ORAL
  Filled 2012-05-28 (×6): qty 2

## 2012-05-28 MED ORDER — VITAMIN D3 50 MCG (2000 UT) PO TABS: 1.0000 | ORAL_TABLET | Freq: Every day | ORAL | Status: DC

## 2012-05-28 MED ORDER — ONDANSETRON HCL 4 MG/2ML IJ SOLN
4.0000 mg | Freq: Once | INTRAMUSCULAR | Status: AC
  Administered 2012-05-28: 4 mg via INTRAVENOUS
  Filled 2012-05-28: qty 2

## 2012-05-28 MED ORDER — HYDROCOD POLST-CHLORPHEN POLST 10-8 MG/5ML PO LQCR: 5.0000 mL | Freq: Two times a day (BID) | ORAL | Status: DC | PRN

## 2012-05-28 MED ORDER — OSELTAMIVIR PHOSPHATE 75 MG PO CAPS: 75.0000 mg | ORAL_CAPSULE | Freq: Two times a day (BID) | ORAL | Status: DC

## 2012-05-28 MED ORDER — ONDANSETRON HCL 4 MG/2ML IJ SOLN: 4.0000 mg | Freq: Four times a day (QID) | INTRAMUSCULAR | Status: DC | PRN

## 2012-05-28 MED ORDER — ENOXAPARIN SODIUM 40 MG/0.4ML ~~LOC~~ SOLN
40.0000 mg | SUBCUTANEOUS | Status: DC
  Administered 2012-05-29: 40 mg via SUBCUTANEOUS
  Filled 2012-05-28: qty 0.4

## 2012-05-28 MED ORDER — ASPIRIN EC 81 MG PO TBEC
81.0000 mg | DELAYED_RELEASE_TABLET | Freq: Once | ORAL | Status: AC
  Administered 2012-05-29: 81 mg via ORAL
  Filled 2012-05-28 (×2): qty 1

## 2012-05-28 MED ORDER — BUDESONIDE-FORMOTEROL FUMARATE 80-4.5 MCG/ACT IN AERO
2.0000 | INHALATION_SPRAY | Freq: Two times a day (BID) | RESPIRATORY_TRACT | Status: DC
  Administered 2012-05-29 – 2012-05-30 (×3): 2 via RESPIRATORY_TRACT
  Filled 2012-05-28: qty 6.9

## 2012-05-28 MED ORDER — ACETAMINOPHEN 650 MG RE SUPP: 650.0000 mg | Freq: Four times a day (QID) | RECTAL | Status: DC | PRN

## 2012-05-28 MED ORDER — GUAIFENESIN ER 600 MG PO TB12
600.0000 mg | ORAL_TABLET | Freq: Two times a day (BID) | ORAL | Status: DC
  Administered 2012-05-28 – 2012-05-30 (×4): 600 mg via ORAL
  Filled 2012-05-28 (×8): qty 1

## 2012-05-28 MED ORDER — ACETAMINOPHEN 325 MG PO TABS
650.0000 mg | ORAL_TABLET | Freq: Four times a day (QID) | ORAL | Status: DC | PRN
  Administered 2012-05-29: 650 mg via ORAL
  Filled 2012-05-28: qty 2

## 2012-05-28 MED ORDER — METHYLPREDNISOLONE SODIUM SUCC 125 MG IJ SOLR
125.0000 mg | Freq: Once | INTRAMUSCULAR | Status: AC
  Administered 2012-05-28: 125 mg via INTRAVENOUS
  Filled 2012-05-28: qty 2

## 2012-05-28 MED ORDER — ALPRAZOLAM 0.5 MG PO TABS
0.5000 mg | ORAL_TABLET | Freq: Four times a day (QID) | ORAL | Status: DC | PRN
  Administered 2012-05-28: 0.5 mg via ORAL
  Filled 2012-05-28: qty 1

## 2012-05-28 MED ORDER — OSELTAMIVIR PHOSPHATE 75 MG PO CAPS
75.0000 mg | ORAL_CAPSULE | Freq: Once | ORAL | Status: AC
  Administered 2012-05-28: 75 mg via ORAL
  Filled 2012-05-28: qty 1

## 2012-05-28 MED ORDER — HYDROCODONE-ACETAMINOPHEN 5-325 MG PO TABS: 1.0000 | ORAL_TABLET | ORAL | Status: DC | PRN

## 2012-05-28 MED ORDER — ALBUTEROL SULFATE (5 MG/ML) 0.5% IN NEBU
2.5000 mg | INHALATION_SOLUTION | RESPIRATORY_TRACT | Status: DC
  Administered 2012-05-28 – 2012-05-30 (×9): 2.5 mg via RESPIRATORY_TRACT
  Filled 2012-05-28 (×9): qty 0.5

## 2012-05-28 MED ORDER — LEVOFLOXACIN IN D5W 750 MG/150ML IV SOLN
750.0000 mg | Freq: Once | INTRAVENOUS | Status: AC
  Administered 2012-05-28: 750 mg via INTRAVENOUS
  Filled 2012-05-28: qty 150

## 2012-05-28 MED ORDER — INSULIN ASPART 100 UNIT/ML ~~LOC~~ SOLN
0.0000 [IU] | Freq: Three times a day (TID) | SUBCUTANEOUS | Status: DC
  Administered 2012-05-29: 3 [IU] via SUBCUTANEOUS
  Administered 2012-05-29 (×2): 4 [IU] via SUBCUTANEOUS
  Administered 2012-05-30: 3 [IU] via SUBCUTANEOUS
  Administered 2012-05-30: 4 [IU] via SUBCUTANEOUS

## 2012-05-28 MED ORDER — OMEGA-3 FATTY ACIDS 1000 MG PO CAPS
1.0000 g | ORAL_CAPSULE | Freq: Every day | ORAL | Status: DC
  Administered 2012-05-29 – 2012-05-30 (×2): 1 g via ORAL
  Filled 2012-05-28 (×6): qty 1

## 2012-05-28 MED ORDER — ONDANSETRON HCL 4 MG PO TABS: 4.0000 mg | ORAL_TABLET | Freq: Four times a day (QID) | ORAL | Status: DC | PRN

## 2012-05-28 MED ORDER — ALBUTEROL SULFATE (5 MG/ML) 0.5% IN NEBU
2.5000 mg | INHALATION_SOLUTION | Freq: Once | RESPIRATORY_TRACT | Status: AC
  Administered 2012-05-28: 2.5 mg via RESPIRATORY_TRACT
  Filled 2012-05-28: qty 0.5

## 2012-05-28 MED ORDER — LEVOTHYROXINE SODIUM 75 MCG PO TABS
75.0000 ug | ORAL_TABLET | Freq: Every day | ORAL | Status: DC
  Administered 2012-05-29 – 2012-05-30 (×2): 75 ug via ORAL
  Filled 2012-05-28 (×4): qty 1

## 2012-05-28 MED ORDER — INSULIN GLARGINE 100 UNIT/ML ~~LOC~~ SOLN
10.0000 [IU] | Freq: Every day | SUBCUTANEOUS | Status: DC
  Administered 2012-05-28: 10 [IU] via SUBCUTANEOUS

## 2012-05-28 NOTE — ED Notes (Signed)
Placed on telemetry

## 2012-05-28 NOTE — ED Notes (Signed)
Attempted to call report, nurse unable to take report, requested that nurse call back when able

## 2012-05-28 NOTE — ED Notes (Signed)
Dr. Fisher in room with pt

## 2012-05-28 NOTE — ED Provider Notes (Signed)
History    Scribed for Dr. [name], the patient was seen in room APA11/AP[facility]11. This chart was scribed by Katha Cabal.   CSN: [id number]  Arrival date & time 05/28/12  1420   First MD Initiated Contact with Patient 05/28/12 1501      Chief Complaint  Patient presents with  . Pneumonia    (Consider location/radiation/quality/duration/timing/severity/associated sxs/prior treatment) HPI Dr. [name] entered patient's room at 3:04 PM   Jenna [name] is a 77 y.o. female with PNA presents to the Emergency Department complaining of persistence of constant cough with associated intermittent fevers since 05/17/12.  Patient was diagnosed with pneumonia two days ago at PCP office and prescribed doxycyline and cough syrup.  Patients reports her cough is getting worse.  Cough is at times dry and at times productive. Symptoms are associated with nausea and vomiting that began last night.   Denies congestion or chest pain.      PCP Rudi Heap, MD       Past Medical History  Diagnosis Date  . OA (osteoarthritis)   . Unspecified hypothyroidism   . Fibromyalgia   . Osteoporosis   . PUD (peptic ulcer disease)   . DDD (degenerative disc disease)   . Herpes zoster   . AF (atrial fibrillation)   . Cataract   . Hyperlipidemia   . Cancer     skin cancer    Past Surgical History  Procedure Date  . Abdominal hysterectomy   . Cesarean section   . Breast biopsy     Right  . Cataract extraction w/phaco 10/28/2011    Procedure: CATARACT EXTRACTION PHACO AND INTRAOCULAR LENS PLACEMENT (IOC);  Surgeon: [name], MD;  Location: [facility];  Service: Ophthalmology;  Laterality: Right;  CDE 18.38  . Cataract extraction w/phaco 02/14/2012    Procedure: CATARACT EXTRACTION PHACO AND INTRAOCULAR LENS PLACEMENT (IOC);  Surgeon: [name], MD;  Location: [facility];  Service: Ophthalmology;  Laterality: Left;  CDE 17.20    Family History  Problem Relation Age of Onset    . Coronary artery disease Neg Hx     No premature  . Anesthesia problems Neg Hx   . Hypotension Neg Hx   . Malignant hyperthermia Neg Hx   . Pseudochol deficiency Neg Hx     History  Substance Use Topics  . Smoking status: Never Smoker   . Smokeless tobacco: Not on file  . Alcohol Use: No    OB History    Grav Para Term Preterm Abortions TAB SAB Ect Mult Living                  Review of Systems  Constitutional: Positive for fever and chills.  Respiratory: Positive for cough and shortness of breath.   Gastrointestinal: Positive for nausea and vomiting.  All other systems reviewed and are negative.   Remaining review of systems negative except as noted in the HPI.   Allergies  Cymbalta; Daypro; Diclofenac sodium; Effexor; Lipitor; Penicillins; Prozac; Sulfa antibiotics; Zocor; Amitriptyline; Milnacipran hcl; and Savella  Home Medications   Current Outpatient Rx  Name  Route  Sig  Dispense  Refill  . ACETAMINOPHEN 500 MG PO TABS   Oral   Take 500 mg by mouth every 6 (six) hours as needed. pain         . VITAMIN D3 2000 UNITS PO TABS   Oral   Take 1 tablet by mouth daily.           [name]  DOXYCYCLINE HYCLATE 100 MG PO TABS   Oral   Take 100 mg by mouth 2 (two) times daily. Started 05/26/2012 for 10 days         . OMEGA-3 FATTY ACIDS 1000 MG PO CAPS   Oral   Take 1 g by mouth daily.          . GUAIFENESIN-CODEINE 100-10 MG/5ML PO SYRP   Oral   Take 5 mLs by mouth every 4 (four) hours as needed. cough         . LEVOTHYROXINE SODIUM 75 MCG PO TABS   Oral   Take 75 mcg by mouth daily.           [name] METOPROLOL TARTRATE 50 MG PO TABS   Oral   Take 50 mg by mouth daily.         [name] PROCTOZONE-HC 2.5 % RE CREA   Rectal   Place 1 application rectally as needed.          . ASPIRIN EC 81 MG PO TBEC   Oral   Take 81 mg by mouth daily as needed.           BP 140/41  Pulse 83  Temp 98.5 F (36.9 C) (Oral)  SpO2 90%  Physical Exam   Constitutional: She is oriented to person, place, and time. She appears well-developed and well-nourished.  HENT:  Head: Normocephalic and atraumatic.  Eyes: Conjunctivae normal and EOM are normal.  Neck: Normal range of motion. Neck supple.  Cardiovascular: Normal rate, regular rhythm and normal heart sounds.   Pulmonary/Chest: Effort normal. No respiratory distress. She has decreased breath sounds in the right lower field and the left lower field. She has wheezes in the right lower field and the left lower field.  Abdominal: There is no tenderness. There is no rebound and no guarding.  Musculoskeletal: Normal range of motion. She exhibits no edema.  Neurological: She is alert and oriented to person, place, and time. Coordination normal.  Skin: Skin is warm and dry.  Psychiatric: She has a normal mood and affect. Her behavior is normal.    ED Course  Procedures (including critical care time)    DIAGNOSTIC STUDIES: Oxygen Saturation is 90% on room air hypoxic by my interpretation.     COORDINATION OF CARE: 3:07 PM  Physical exam complete.  Will order IV fluids.     LABS / RADIOLOGY:    Labs Reviewed  CBC WITH DIFFERENTIAL  BASIC METABOLIC PANEL   Dg Chest 2 View  05/28/2012  *RADIOLOGY REPORT*  Clinical Data: Cough.  Fever.  Shortness of breath.  Chest congestion.  CHEST - 2 VIEW  Comparison: Two-view chest x-ray 05/26/2012.  Portable chest x-ray 10/16/2008.  Findings: Cardiac silhouette mildly enlarged but stable.  Thoracic aorta mildly atherosclerotic.  Hilar and mediastinal contours otherwise unremarkable.  Stable mild chronic elevation of the right hemidiaphragm and scarring in the lingula and right middle lobe. Mild hyperinflation and moderate central peribronchial thickening, similar to the examination 2 days ago, increased since 2010.  No new pulmonary parenchymal abnormalities.  No pleural effusions. Degenerative changes involving the thoracic spine.  IMPRESSION: Mild to  moderate changes of acute bronchitis and/or asthma without localized airspace pneumonia.   Original Report Authenticated By: Hulan Saas, M.D.          MDM  Patient comes to the ER for evaluation of worsening difficulty breathing. Patient was diagnosed with pneumonia by her primary care doctor 2 days ago. She was  started on doxycycline and has not improved. Patient reports fever at home mother she was afebrile here in the ER. Patient also has nausea and vomiting which started last night. She was hypoxic arrival, room-air oxygen saturation 90%. She has no previous pulmonary ailments. A chest x-ray was performed and does not show any infiltrates at this time. A blood gas was performed because of her hypoxia she is mildly retaining CO2 as well. Patient has failed outpatient therapy with oral antibiotics and will require hospitalization for further treatment. Patient empirically treated with IV Levaquin. Influenza PCR panel has been sent, patient apparently started on Tamiflu as well because of the high prevalence of H1N1 at this time.       MEDICATIONS GIVEN IN THE E.D. Scheduled Meds:    . albuterol  2.5 mg Nebulization Once  . methylPREDNISolone (SOLU-MEDROL) injection  125 mg Intravenous Once  . ondansetron (ZOFRAN) IV  4 mg Intravenous Once   Continuous Infusions:    . sodium chloride         IMPRESSION: 1. Acute bronchitis      NEW MEDICATIONS: New Prescriptions   No medications on file      I personally performed the services described in this documentation, which was scribed in my presence. The recorded information has been reviewed and is accurate.       [name], MD 05/28/12 [phone]

## 2012-05-28 NOTE — ED Notes (Signed)
Dx with pneumonia x 2 days ago.  States was told to come back on Tuesday for f/u but pt reports is getting worse and needed to come back today.

## 2012-05-28 NOTE — H&P (Signed)
[facility] History and Physical  KA BENCH GNF:[id number] DOB: Mar 24, 1936 DOA: 05/28/2012  Referring physician: ED physician, Dr. Blinda Leatherwood PCP: Rudi Heap, MD  Specialists: Cardiologist, Dr. Diona Browner  Chief Complaint: Cough, chest congestion, generalized weakness.  HPI: Jenna BURLISON is a 77 y.o. female with a history significant for chronic atrial fibrillation, on antiplatelet therapy, fibromyalgia, and osteoporosis, who presents to the emergency department with a chief complaint of cough, chest congestion, and generalized weakness. Her symptoms started approximately 10 days ago. It began with a congested cough. Her cough has been mostly nonproductive. She has had a productive cough on a couple of occasions, but she does not recall the color of her sputum. She has had chest congestion and wheezing. She's had generalized weakness and a poor appetite. She has had subjective fever and chills. She has chest wall pain with coughing. She denies pleurisy. She denies sore throat or nasal congestion. She has had several loose stools and abdominal "soreness". She had 2 episodes of nausea and vomiting yesterday, but has been able to keep down liquids. She denies coffee grounds emesis, bright red blood per rectum, and black tarry stools. She was seen by her primary care provider 2 days ago. She was started on doxycycline and a cough medication with codeine. Her symptoms have not improved. When she was asked if the nausea and vomiting occurred after she started taking doxycycline, she stated yes.  In the emergency department, she is afebrile and hemodynamically stable. Her oxygen saturation on room air on admission was 89%. Is currently 95% on 3 L of nasal cannula oxygen. Her ABG on oxygen reveals a pH of 7.45, PCO2 of 52.2, and a PO2 of 85.2. Her chest x-ray reveals mild to moderate changes of acute bronchitis and/or asthma without localized airspace pneumonia. She is being admitted for further  evaluation and management.    Review of Systems: As above in history present illness. In addition, she has chronic arthritic pain. Otherwise review of systems is negative.  Past Medical History  Diagnosis Date  . OA (osteoarthritis)   . Unspecified hypothyroidism   . Fibromyalgia   . Osteoporosis   . PUD (peptic ulcer disease)   . DDD (degenerative disc disease)   . Herpes zoster   . AF (atrial fibrillation)   . Cataract   . Hyperlipidemia   . Cancer     skin cancer   Past Surgical History  Procedure Date  . Abdominal hysterectomy   . Cesarean section   . Breast biopsy     Right  . Cataract extraction w/phaco 10/28/2011    Procedure: CATARACT EXTRACTION PHACO AND INTRAOCULAR LENS PLACEMENT (IOC);  Surgeon: [name], MD;  Location: [facility];  Service: Ophthalmology;  Laterality: Right;  CDE 18.38  . Cataract extraction w/phaco 02/14/2012    Procedure: CATARACT EXTRACTION PHACO AND INTRAOCULAR LENS PLACEMENT (IOC);  Surgeon: [name], MD;  Location: [facility];  Service: Ophthalmology;  Laterality: Left;  CDE 17.20   Social History: She is married. She lives in [place]. She has one son. She is retired. She denies tobacco, alcohol, and illicit drug use.    Allergies  Allergen Reactions  . Cymbalta (Duloxetine Hcl) Nausea Only  . Daypro (Oxaprozin)   . Diclofenac Sodium   . Effexor (Venlafaxine Hydrochloride)   . Lipitor (Atorvastatin Calcium) Other (See Comments)    Leg aches  . Penicillins   . Prozac (Fluoxetine Hcl)   . Sulfa Antibiotics   . Zocor (Simvastatin) Other (See Comments)  Hip pain  . Amitriptyline Rash  . Milnacipran Hcl Rash  . Savella (Milnacipran Hcl) Rash    Family History  Problem Relation Age of Onset  . Coronary artery disease Neg Hx     No premature  . Anesthesia problems Neg Hx   . Hypotension Neg Hx   . Malignant hyperthermia Neg Hx   . Pseudochol deficiency Neg Hx    Family history continued: Her mother died of a stroke. Her father  died of prostate cancer.  Prior to Admission medications   Medication Sig Start Date End Date Taking? Authorizing Provider  acetaminophen (TYLENOL) 500 MG tablet Take 500 mg by mouth every 6 (six) hours as needed. pain   Yes Historical Provider, MD  Cholecalciferol (VITAMIN D3) 2000 UNITS TABS Take 1 tablet by mouth daily.     Yes Historical Provider, MD  doxycycline (VIBRA-TABS) 100 MG tablet Take 100 mg by mouth 2 (two) times daily. Started 05/26/2012 for 10 days   Yes Historical Provider, MD  fish oil-omega-3 fatty acids 1000 MG capsule Take 1 g by mouth daily.    Yes Historical Provider, MD  guaiFENesin-codeine (ROBITUSSIN AC) 100-10 MG/5ML syrup Take 5 mLs by mouth every 4 (four) hours as needed. cough   Yes Historical Provider, MD  levothyroxine (SYNTHROID, LEVOTHROID) 75 MCG tablet Take 75 mcg by mouth daily.     Yes Historical Provider, MD  metoprolol (LOPRESSOR) 50 MG tablet Take 50 mg by mouth daily.   Yes Historical Provider, MD  PROCTOZONE-HC 2.5 % rectal cream Place 1 application rectally as needed.  01/03/12  Yes Historical Provider, MD  aspirin EC 81 MG tablet Take 81 mg by mouth daily as needed.    Historical Provider, MD   Physical Exam: Filed Vitals:   05/28/12 1423 05/28/12 1544  BP: 140/41   Pulse: 83   Temp: 98.5 F (36.9 C)   TempSrc: Oral   SpO2: 90% 89%     General:  Pleasant overweight 77 year old Caucasian woman sitting up and, in no acute distress.  Eyes: Pupils are equal, round, and reactive to light. Extraocular movements are intact. Conjunctivae are clear. Sclerae are white.  ENT: Oropharynx reveals mildly dry mucous membranes. No exudates or erythema. Nasal mucosa is moist, with no active rhinorrhea. Tympanic membranes are clear bilaterally.  Neck: Supple, no adenopathy, no thyromegaly, no JVD.  Cardiovascular: Irregular, irregular versus S1, S2, with occasional ectopy.  Respiratory: Several wet coughs. Breathing is nonlabored at rest. Bilateral  rhonchus wheezing.  Abdomen: Obese, positive bowel sounds, soft, nontender, nondistended.  Skin: Fair to good turgor.  Musculoskeletal: Pedal pulses palpable. No pedal edema. No acute hot red joints.  Psychiatric: Pleasant affect. She is alert and oriented x3. Her speech is clear.  Neurologic: Cranial nerves II through XII are intact. Globally, her strength is 5 over 5 in the sitting position. Sensation is grossly intact.  Labs on Admission:  Basic Metabolic Panel:  Lab 05/28/12 1610  NA 142  K 4.2  CL 84*  CO2 31  GLUCOSE 116*  BUN 11  CREATININE 0.67  CALCIUM 9.0  MG --  PHOS --   Liver Function Tests: No results found for this basename: AST:5,ALT:5,ALKPHOS:5,BILITOT:5,PROT:5,ALBUMIN:5 in the last 168 hours No results found for this basename: LIPASE:5,AMYLASE:5 in the last 168 hours No results found for this basename: AMMONIA:5 in the last 168 hours CBC:  Lab 05/28/12 1517  WBC 9.5  NEUTROABS 7.4  HGB 12.9  HCT 36.6  MCV 92.2  PLT 191  Cardiac Enzymes: No results found for this basename: CKTOTAL:5,CKMB:5,CKMBINDEX:5,TROPONINI:5 in the last 168 hours  BNP (last 3 results) No results found for this basename: PROBNP:3 in the last 8760 hours CBG: No results found for this basename: GLUCAP:5 in the last 168 hours  Radiological Exams on Admission: Dg Chest 2 View  05/28/2012  *RADIOLOGY REPORT*  Clinical Data: Cough.  Fever.  Shortness of breath.  Chest congestion.  CHEST - 2 VIEW  Comparison: Two-view chest x-ray 05/26/2012.  Portable chest x-ray 10/16/2008.  Findings: Cardiac silhouette mildly enlarged but stable.  Thoracic aorta mildly atherosclerotic.  Hilar and mediastinal contours otherwise unremarkable.  Stable mild chronic elevation of the right hemidiaphragm and scarring in the lingula and right middle lobe. Mild hyperinflation and moderate central peribronchial thickening, similar to the examination 2 days ago, increased since 2010.  No new pulmonary  parenchymal abnormalities.  No pleural effusions. Degenerative changes involving the thoracic spine.  IMPRESSION: Mild to moderate changes of acute bronchitis and/or asthma without localized airspace pneumonia.   Original Report Authenticated By: Hulan Saas, M.D.       Assessment/Plan Principal Problem:  *Acute bronchitis Active Problems:  Atrial fibrillation  Unspecified hypothyroidism  Fibromyalgia  Osteoporosis  Acute respiratory failure with hypercapnia   1. This is a pleasant 77 year old woman who presents with symptomatology consistent with acute bronchitis. She has failed outpatient therapy. She has acute respiratory failure with mild hypoxia and hypercapnia, but she does not appear to be in respiratory distress. She does not appear toxic or as if she is going to decompensate from a respiratory standpoint. Her atrial fibrillation is controlled. She is not anticoagulated, rather, she is treated with aspirin and metoprolol. We'll consider holding metoprolol in the setting of bronchospasms, but this will be readdressed in the morning. I anticipate that her blood glucose will be elevated, and therefore, insulin therapy will be initiated.      Plan: 1. The patient was given 125 mg of Solu-Medrol, IV Levaquin, albuterol nebulizer, and Tamiflu in the emergency department. 2. We'll continue antibiotic treatment with Levaquin and steroid treatment with IV Solu-Medrol. We'll continue bronchodilator therapy with both albuterol and Atrovent nebulizers. We'll add Symbicort as well. 3. Symptomatic treatment with Tessalon Perles and as needed Tussionex. We'll also start Mucinex every 12 hours. 4. Start Pepcid empirically. We'll add when necessary Zofran. 5. We'll start a full liquid diet initially and then progress as tolerated if no further nausea vomiting. 6. We'll start sliding scale NovoLog. 7. For further evaluation, we will check an influenza PCR panel, strep pneumo antigen, and  Legionella antigen.  Code Status: Full code Family Communication: Discussed with her husband and sister. Disposition Plan: Anticipate discharge to home in 3-4 days.  Time spent: One hour  [facility] Pager [phone]  If 7PM-7AM, please contact night-coverage [email or web address] Password [facility] 05/28/2012, 5:33 PM

## 2012-05-28 NOTE — ED Notes (Signed)
Pt with recent dx of PNA, states getting worse-cough is worse and productive at times, weakness and c/o soreness to chest from coughing; states she is on antibiotic for it and has been taking as prescribed

## 2012-05-29 DIAGNOSIS — R7309 Other abnormal glucose: Secondary | ICD-10-CM | POA: Diagnosis not present

## 2012-05-29 DIAGNOSIS — J209 Acute bronchitis, unspecified: Secondary | ICD-10-CM | POA: Diagnosis not present

## 2012-05-29 DIAGNOSIS — J9600 Acute respiratory failure, unspecified whether with hypoxia or hypercapnia: Secondary | ICD-10-CM | POA: Diagnosis not present

## 2012-05-29 DIAGNOSIS — R739 Hyperglycemia, unspecified: Secondary | ICD-10-CM | POA: Diagnosis not present

## 2012-05-29 DIAGNOSIS — I4891 Unspecified atrial fibrillation: Secondary | ICD-10-CM | POA: Diagnosis not present

## 2012-05-29 DIAGNOSIS — T50905A Adverse effect of unspecified drugs, medicaments and biological substances, initial encounter: Secondary | ICD-10-CM | POA: Diagnosis not present

## 2012-05-29 DIAGNOSIS — T50904A Poisoning by unspecified drugs, medicaments and biological substances, undetermined, initial encounter: Secondary | ICD-10-CM

## 2012-05-29 LAB — BASIC METABOLIC PANEL
BUN: 10 mg/dL (ref 6–23)
Chloride: 99 mEq/L (ref 96–112)
Glucose, Bld: 175 mg/dL — ABNORMAL HIGH (ref 70–99)
Potassium: 3.9 mEq/L (ref 3.5–5.1)

## 2012-05-29 LAB — STREP PNEUMONIAE URINARY ANTIGEN: Strep Pneumo Urinary Antigen: NEGATIVE

## 2012-05-29 LAB — CBC
HCT: 35.5 % — ABNORMAL LOW (ref 36.0–46.0)
Hemoglobin: 12.3 g/dL (ref 12.0–15.0)
MCH: 31.9 pg (ref 26.0–34.0)
MCHC: 34.6 g/dL (ref 30.0–36.0)

## 2012-05-29 LAB — GLUCOSE, CAPILLARY

## 2012-05-29 MED ORDER — INSULIN GLARGINE 100 UNIT/ML ~~LOC~~ SOLN
12.0000 [IU] | Freq: Two times a day (BID) | SUBCUTANEOUS | Status: DC
  Administered 2012-05-29 – 2012-05-30 (×2): 12 [IU] via SUBCUTANEOUS

## 2012-05-29 NOTE — Plan of Care (Signed)
Problem: Phase I Progression Outcomes Goal: OOB as tolerated unless otherwise ordered Outcome: Progressing Pt is ambulating to the BR and the POC today will be to get OOB to chair.

## 2012-05-29 NOTE — Progress Notes (Signed)
Subjective: The patient says that she feels better. She still has some chest congestion and cough, but the cough medications are helping.  Objective: Vital signs in last 24 hours: Filed Vitals:   05/29/12 0127 05/29/12 0630 05/29/12 0657 05/29/12 1131  BP:  133/73    Pulse:  80    Temp:  98.2 F (36.8 C)    TempSrc:  Oral    Resp:  20    Height:      Weight:      SpO2: 93% 92% 92% 92%    Intake/Output Summary (Last 24 hours) at 05/29/12 1158 Last data filed at 05/29/12 0307  Gross per 24 hour  Intake      0 ml  Output    300 ml  Net   -300 ml    Weight change:   Physical exam: General: Pleasant alert overweight Caucasian woman sitting up in bed, in no acute distress. Lungs: Less rhonchi, but still with crackles and wheezes. Heart: S1, S2, with a soft systolic murmur. Abdomen: Obese, positive bowel sounds, soft, nontender, nondistended. Extremities: No pedal edema. Neurologic: Cranial nerves II through XII are intact. She is alert and oriented x3.  Lab Results: Basic Metabolic Panel:  Basename 05/29/12 0505 05/28/12 1517  NA 136 142  K 3.9 4.2  CL 99 84*  CO2 29 31  GLUCOSE 175* 116*  BUN 10 11  CREATININE 0.61 0.67  CALCIUM 8.8 9.0  MG -- --  PHOS -- --   Liver Function Tests: No results found for this basename: AST:2,ALT:2,ALKPHOS:2,BILITOT:2,PROT:2,ALBUMIN:2 in the last 72 hours No results found for this basename: LIPASE:2,AMYLASE:2 in the last 72 hours No results found for this basename: AMMONIA:2 in the last 72 hours CBC:  Basename 05/29/12 0505 05/28/12 1517  WBC 7.2 9.5  NEUTROABS -- 7.4  HGB 12.3 12.9  HCT 35.5* 36.6  MCV 92.0 92.2  PLT 187 191   Cardiac Enzymes: No results found for this basename: CKTOTAL:3,CKMB:3,CKMBINDEX:3,TROPONINI:3 in the last 72 hours BNP: No results found for this basename: PROBNP:3 in the last 72 hours D-Dimer: No results found for this basename: DDIMER:2 in the last 72 hours CBG:  Basename 05/29/12 1114  05/29/12 0730 05/28/12 2338  GLUCAP 144* 164* 166*   Hemoglobin A1C: No results found for this basename: HGBA1C in the last 72 hours Fasting Lipid Panel: No results found for this basename: CHOL,HDL,LDLCALC,TRIG,CHOLHDL,LDLDIRECT in the last 72 hours Thyroid Function Tests: No results found for this basename: TSH,T4TOTAL,FREET4,T3FREE,THYROIDAB in the last 72 hours Anemia Panel: No results found for this basename: VITAMINB12,FOLATE,FERRITIN,TIBC,IRON,RETICCTPCT in the last 72 hours Coagulation: No results found for this basename: LABPROT:2,INR:2 in the last 72 hours Urine Drug Screen: Drugs of Abuse  No results found for this basename: labopia, cocainscrnur, labbenz, amphetmu, thcu, labbarb    Alcohol Level: No results found for this basename: ETH:2 in the last 72 hours Urinalysis: No results found for this basename: COLORURINE:2,APPERANCEUR:2,LABSPEC:2,PHURINE:2,GLUCOSEU:2,HGBUR:2,BILIRUBINUR:2,KETONESUR:2,PROTEINUR:2,UROBILINOGEN:2,NITRITE:2,LEUKOCYTESUR:2 in the last 72 hours Misc. Labs:   Micro: No results found for this or any previous visit (from the past 240 hour(s)).  Studies/Results: Dg Chest 2 View  05/28/2012  *RADIOLOGY REPORT*  Clinical Data: Cough.  Fever.  Shortness of breath.  Chest congestion.  CHEST - 2 VIEW  Comparison: Two-view chest x-ray 05/26/2012.  Portable chest x-ray 10/16/2008.  Findings: Cardiac silhouette mildly enlarged but stable.  Thoracic aorta mildly atherosclerotic.  Hilar and mediastinal contours otherwise unremarkable.  Stable mild chronic elevation of the right hemidiaphragm and scarring in the lingula and  right middle lobe. Mild hyperinflation and moderate central peribronchial thickening, similar to the examination 2 days ago, increased since 2010.  No new pulmonary parenchymal abnormalities.  No pleural effusions. Degenerative changes involving the thoracic spine.  IMPRESSION: Mild to moderate changes of acute bronchitis and/or asthma without  localized airspace pneumonia.   Original Report Authenticated By: Hulan Saas, M.D.     Medications:  Scheduled:   . albuterol  2.5 mg Nebulization Q4H  . benzonatate  100 mg Oral TID  . budesonide-formoterol  2 puff Inhalation BID  . cholecalciferol  2,000 Units Oral Daily  . enoxaparin (LOVENOX) injection  40 mg Subcutaneous Q24H  . famotidine  20 mg Oral Daily  . fish oil-omega-3 fatty acids  1 g Oral Daily  . guaiFENesin  600 mg Oral BID  . insulin aspart  0-20 Units Subcutaneous TID WC  . insulin aspart  0-5 Units Subcutaneous QHS  . insulin glargine  10 Units Subcutaneous QHS  . ipratropium  0.5 mg Nebulization Q4H  . levofloxacin (LEVAQUIN) IV  750 mg Intravenous Q24H  . levothyroxine  75 mcg Oral QAC breakfast  . methylPREDNISolone (SOLU-MEDROL) injection  80 mg Intravenous Q8H  . metoprolol  25 mg Oral BID   Continuous:   . 0.9 % NaCl with KCl 20 mEq / L 1,000 mL (05/28/12 2347)   ZOX:[id number], acetaminophen, ALPRAZolam, alum & mag hydroxide-simeth, chlorpheniramine-HYDROcodone, HYDROcodone-acetaminophen, ondansetron (ZOFRAN) IV, ondansetron  Assessment: Principal Problem:  *Acute bronchitis Active Problems:  Atrial fibrillation  Unspecified hypothyroidism  Fibromyalgia  Osteoporosis  Acute respiratory failure with hypercapnia  Hyperglycemia, drug-induced   1. Acute bronchitis. Currently stable, but slightly symptomatically improved. We'll continue IV steroids, IV Levaquin, bronchodilators, and supportive treatment. Tamiflu was discontinued when the influenza PCR proved to be negative. Symbicort was added.  Acute respiratory failure with hypercapnia, secondary to #1. Treatment as above. Next  Chronic atrial fibrillation. Her heart rate is controlled. Metoprolol was changed to twice a day dosing rather than once daily dosing.   Hyperglycemia, secondary to steroid treatment. Will adjust sliding scale NovoLog and Lantus.  Hypothyroidism. Continue  Synthroid. TSH is pending.  Plan:  1. Will increase Lantus to twice a day dosing. 2. We'll decrease the IV fluids some. 3. Otherwise, continue current management.   LOS: 1 day   Arlando Leisinger 05/29/2012, 11:58 AM

## 2012-05-30 DIAGNOSIS — J209 Acute bronchitis, unspecified: Secondary | ICD-10-CM | POA: Diagnosis not present

## 2012-05-30 DIAGNOSIS — I4891 Unspecified atrial fibrillation: Secondary | ICD-10-CM | POA: Diagnosis not present

## 2012-05-30 LAB — GLUCOSE, CAPILLARY
Glucose-Capillary: 187 mg/dL — ABNORMAL HIGH (ref 70–99)
Glucose-Capillary: 168 mg/dL — ABNORMAL HIGH (ref 70–99)

## 2012-05-30 MED ORDER — PREDNISONE 20 MG PO TABS: ORAL_TABLET | ORAL | Status: DC

## 2012-05-30 MED ORDER — LEVOFLOXACIN 500 MG PO TABS: 500.0000 mg | ORAL_TABLET | Freq: Every day | ORAL | Status: AC

## 2012-05-30 MED ORDER — BUDESONIDE-FORMOTEROL FUMARATE 80-4.5 MCG/ACT IN AERO: 2.0000 | INHALATION_SPRAY | Freq: Two times a day (BID) | RESPIRATORY_TRACT | Status: DC

## 2012-05-30 NOTE — Progress Notes (Signed)
Pt. Discharged. Instructions were reviewed, perscriptions were given, education was completed, and IV was removed. Pt. Being discharged to come.

## 2012-05-30 NOTE — Discharge Summary (Signed)
Physician Discharge Summary  Jenna [name] GMW:[id number] DOB: 11-08-1935 DOA: 05/28/2012  PCP: Rudi Heap, MD  Admit date: 05/28/2012 Discharge date: 05/30/2012  Time spent: Greater than 30 minutes  Recommendations for Outpatient Follow-up:  1. Follow with primary care physician in approximately one week's time.   Discharge Diagnoses:  1. Acute bronchitis, improved. 2. Chronic atrial fibrillation, stable.   Discharge Condition: Stable and improved.  Diet recommendation: Regular.  Filed Weights   05/28/12 2315  Weight: 93.1 kg (205 lb 4 oz)    History of present illness:  This very pleasant 77 year old lady presented to the hospital with symptoms of cough, chest congestion and generalized weakness. Please see initial history as outlined below: HPI: Jenna [name] is a 77 y.o. female with a history significant for chronic atrial fibrillation, on antiplatelet therapy, fibromyalgia, and osteoporosis, who presents to the emergency department with a chief complaint of cough, chest congestion, and generalized weakness. Her symptoms started approximately 10 days ago. It began with a congested cough. Her cough has been mostly nonproductive. She has had a productive cough on a couple of occasions, but she does not recall the color of her sputum. She has had chest congestion and wheezing. She's had generalized weakness and a poor appetite. She has had subjective fever and chills. She has chest wall pain with coughing. She denies pleurisy. She denies sore throat or nasal congestion. She has had several loose stools and abdominal "soreness". She had 2 episodes of nausea and vomiting yesterday, but has been able to keep down liquids. She denies coffee grounds emesis, bright red blood per rectum, and black tarry stools. She was seen by her primary care provider 2 days ago. She was started on doxycycline and a cough medication with codeine. Her symptoms have not improved. When she was asked if the  nausea and vomiting occurred after she started taking doxycycline, she stated yes.  In the emergency department, she is afebrile and hemodynamically stable. Her oxygen saturation on room air on admission was 89%. Is currently 95% on 3 L of nasal cannula oxygen. Her ABG on oxygen reveals a pH of 7.45, PCO2 of 52.2, and a PO2 of 85.2. Her chest x-ray reveals mild to moderate changes of acute bronchitis and/or asthma without localized airspace pneumonia. She is being admitted for further evaluation and management.  Hospital Course:  The patient made rapid improvement with intravenous steroids, antibiotics and bronchodilators. She feels back to her baseline now. Her oxygen saturation on room is very adequate. She has no increased work of breathing. She has no fever. Chest x-ray did not show any pneumonia. It did show evidence of bronchitis.  Procedures:  None.   Consultations:  None.  Discharge Exam: Filed Vitals:   05/29/12 2306 05/30/12 0609 05/30/12 0648 05/30/12 0900  BP:  155/77    Pulse:  79    Temp:  97.8 F (36.6 C)    TempSrc:  Oral    Resp:  19    Height:      Weight:      SpO2: 92% 94% 96% 94%    General: She looks systemically well. There is no increased work of breathing. There is no peripheral or central cyanosis. Saturation on room air is 94%. Cardiovascular: Heart sounds are present without gallop rhythm. There are no murmurs. There is no evidence clinically of heart failure. Respiratory: Lung fields are clear with some few scattered wheezing. There is no crackles. There is no bronchial breathing.  Discharge Instructions  Discharge Orders    Future Orders Please Complete By Expires   Diet - low sodium heart healthy      Increase activity slowly          Medication List     As of 05/30/2012 10:05 AM    STOP taking these medications         doxycycline 100 MG tablet   Commonly known as: VIBRA-TABS      TAKE these medications         acetaminophen 500 MG  tablet   Commonly known as: TYLENOL   Take 500 mg by mouth every 6 (six) hours as needed. pain      aspirin EC 81 MG tablet   Take 81 mg by mouth daily as needed.      budesonide-formoterol 80-4.5 MCG/ACT inhaler   Commonly known as: SYMBICORT   Inhale 2 puffs into the lungs 2 (two) times daily.      fish oil-omega-3 fatty acids 1000 MG capsule   Take 1 g by mouth daily.      guaiFENesin-codeine 100-10 MG/5ML syrup   Commonly known as: ROBITUSSIN AC   Take 5 mLs by mouth every 4 (four) hours as needed. cough      levofloxacin 500 MG tablet   Commonly known as: LEVAQUIN   Take 1 tablet (500 mg total) by mouth daily.      levothyroxine 75 MCG tablet   Commonly known as: SYNTHROID, LEVOTHROID   Take 75 mcg by mouth daily.      metoprolol 50 MG tablet   Commonly known as: LOPRESSOR   Take 50 mg by mouth daily.      predniSONE 20 MG tablet   Commonly known as: DELTASONE   Take 2 tablets daily for 3 days, then 1 tablet daily for 3 days, then half tablet daily for 3 days, then STOP.      PROCTOZONE-HC 2.5 % rectal cream   Generic drug: hydrocortisone   Place 1 application rectally as needed.      Vitamin D3 2000 UNITS Tabs   Take 1 tablet by mouth daily.          The results of significant diagnostics from this hospitalization (including imaging, microbiology, ancillary and laboratory) are listed below for reference.    Significant Diagnostic Studies: Dg Chest 2 View  05/28/2012  *RADIOLOGY REPORT*  Clinical Data: Cough.  Fever.  Shortness of breath.  Chest congestion.  CHEST - 2 VIEW  Comparison: Two-view chest x-ray 05/26/2012.  Portable chest x-ray 10/16/2008.  Findings: Cardiac silhouette mildly enlarged but stable.  Thoracic aorta mildly atherosclerotic.  Hilar and mediastinal contours otherwise unremarkable.  Stable mild chronic elevation of the right hemidiaphragm and scarring in the lingula and right middle lobe. Mild hyperinflation and moderate central peribronchial  thickening, similar to the examination 2 days ago, increased since 2010.  No new pulmonary parenchymal abnormalities.  No pleural effusions. Degenerative changes involving the thoracic spine.  IMPRESSION: Mild to moderate changes of acute bronchitis and/or asthma without localized airspace pneumonia.   Original Report Authenticated By: Hulan Saas, M.D.        Labs: Basic Metabolic Panel:  Lab 05/29/12 0981 05/28/12 1517  NA 136 142  K 3.9 4.2  CL 99 84*  CO2 29 31  GLUCOSE 175* 116*  BUN 10 11  CREATININE 0.61 0.67  CALCIUM 8.8 9.0  MG -- --  PHOS -- --       CBC:  Lab 05/29/12 0505  05/28/12 1517  WBC 7.2 9.5  NEUTROABS -- 7.4  HGB 12.3 12.9  HCT 35.5* 36.6  MCV 92.0 92.2  PLT 187 191     CBG:  Lab 05/30/12 0736 05/29/12 2059 05/29/12 1657 05/29/12 1114 05/29/12 0730  GLUCAP 168* 187* 154* 144* 164*       Signed:  Darean Rote C  [facility] 05/30/2012, 10:05 AM

## 2012-06-12 DIAGNOSIS — E785 Hyperlipidemia, unspecified: Secondary | ICD-10-CM | POA: Diagnosis not present

## 2012-06-12 DIAGNOSIS — J189 Pneumonia, unspecified organism: Secondary | ICD-10-CM | POA: Diagnosis not present

## 2012-08-09 ENCOUNTER — Other Ambulatory Visit: Payer: Self-pay | Admitting: Family Medicine

## 2012-09-09 ENCOUNTER — Other Ambulatory Visit: Payer: Self-pay | Admitting: Family Medicine

## 2012-09-14 ENCOUNTER — Emergency Department (HOSPITAL_COMMUNITY)
Admission: EM | Admit: 2012-09-14 | Discharge: 2012-09-15 | Disposition: A | Discharge status: Home / Self Care | Payer: Medicare Other | Attending: Emergency Medicine | Admitting: Emergency Medicine

## 2012-09-14 ENCOUNTER — Encounter (HOSPITAL_COMMUNITY): Payer: Self-pay | Admitting: Emergency Medicine

## 2012-09-14 DIAGNOSIS — Z7982 Long term (current) use of aspirin: Secondary | ICD-10-CM | POA: Diagnosis not present

## 2012-09-14 DIAGNOSIS — Z8711 Personal history of peptic ulcer disease: Secondary | ICD-10-CM | POA: Insufficient documentation

## 2012-09-14 DIAGNOSIS — Z961 Presence of intraocular lens: Secondary | ICD-10-CM | POA: Insufficient documentation

## 2012-09-14 DIAGNOSIS — M1990 Unspecified osteoarthritis, unspecified site: Secondary | ICD-10-CM | POA: Diagnosis not present

## 2012-09-14 DIAGNOSIS — Z8739 Personal history of other diseases of the musculoskeletal system and connective tissue: Secondary | ICD-10-CM | POA: Diagnosis not present

## 2012-09-14 DIAGNOSIS — Z862 Personal history of diseases of the blood and blood-forming organs and certain disorders involving the immune mechanism: Secondary | ICD-10-CM | POA: Diagnosis not present

## 2012-09-14 DIAGNOSIS — Z8669 Personal history of other diseases of the nervous system and sense organs: Secondary | ICD-10-CM | POA: Insufficient documentation

## 2012-09-14 DIAGNOSIS — Z8619 Personal history of other infectious and parasitic diseases: Secondary | ICD-10-CM | POA: Diagnosis not present

## 2012-09-14 DIAGNOSIS — Z79899 Other long term (current) drug therapy: Secondary | ICD-10-CM | POA: Insufficient documentation

## 2012-09-14 DIAGNOSIS — M810 Age-related osteoporosis without current pathological fracture: Secondary | ICD-10-CM | POA: Insufficient documentation

## 2012-09-14 DIAGNOSIS — Z9849 Cataract extraction status, unspecified eye: Secondary | ICD-10-CM | POA: Diagnosis not present

## 2012-09-14 DIAGNOSIS — Z85828 Personal history of other malignant neoplasm of skin: Secondary | ICD-10-CM | POA: Diagnosis not present

## 2012-09-14 DIAGNOSIS — R002 Palpitations: Secondary | ICD-10-CM | POA: Diagnosis not present

## 2012-09-14 DIAGNOSIS — E039 Hypothyroidism, unspecified: Secondary | ICD-10-CM | POA: Diagnosis not present

## 2012-09-14 DIAGNOSIS — Z880 Allergy status to penicillin: Secondary | ICD-10-CM | POA: Insufficient documentation

## 2012-09-14 DIAGNOSIS — Z8639 Personal history of other endocrine, nutritional and metabolic disease: Secondary | ICD-10-CM | POA: Insufficient documentation

## 2012-09-14 DIAGNOSIS — Z8679 Personal history of other diseases of the circulatory system: Secondary | ICD-10-CM | POA: Diagnosis not present

## 2012-09-14 NOTE — ED Notes (Signed)
Patient complaining of "heart racing" since 2000 tonight. States "it has happened before but it usually goes away on it's own." Denies pain.

## 2012-09-15 LAB — CBC WITH DIFFERENTIAL/PLATELET
Basophils Relative: 0 % (ref 0–1)
Eosinophils Absolute: 0.1 10*3/uL (ref 0.0–0.7)
HCT: 36.9 % (ref 36.0–46.0)
Hemoglobin: 13 g/dL (ref 12.0–15.0)
MCH: 32.7 pg (ref 26.0–34.0)
MCHC: 35.2 g/dL (ref 30.0–36.0)
MCV: 92.9 fL (ref 78.0–100.0)
Monocytes Absolute: 0.4 10*3/uL (ref 0.1–1.0)
Monocytes Relative: 9 % (ref 3–12)
Neutro Abs: 2.3 10*3/uL (ref 1.7–7.7)

## 2012-09-15 LAB — BASIC METABOLIC PANEL
BUN: 12 mg/dL (ref 6–23)
Chloride: 102 mEq/L (ref 96–112)
Creatinine, Ser: 0.69 mg/dL (ref 0.50–1.10)
GFR calc Af Amer: >90 mL/min (ref >90)

## 2012-09-15 NOTE — ED Provider Notes (Signed)
History     CSN: [id number]  Arrival date & time 09/14/12  2259   First MD Initiated Contact with Patient 09/15/12 0030      Chief Complaint  Patient presents with  . Palpitations    (Consider location/radiation/quality/duration/timing/severity/associated sxs/prior treatment) HPI Jenna [name] is a 77 y.o. female who presents to the Emergency Department complaining of palpitations that wont allow her to go to sleep. She feels her heart beats and she is having extra heart beats. There is no associated shortness of breath or chest pain. She denies fever, chills, nausea, vomiting.   PCP Dr. Christell Constant Past Medical History  Diagnosis Date  . OA (osteoarthritis)   . Unspecified hypothyroidism   . Fibromyalgia   . Osteoporosis   . PUD (peptic ulcer disease)   . DDD (degenerative disc disease)   . Herpes zoster   . AF (atrial fibrillation)   . Cataract   . Hyperlipidemia   . Cancer     skin cancer    Past Surgical History  Procedure Laterality Date  . Abdominal hysterectomy    . Cesarean section    . Breast biopsy      Right  . Cataract extraction w/phaco  10/28/2011    Procedure: CATARACT EXTRACTION PHACO AND INTRAOCULAR LENS PLACEMENT (IOC);  Surgeon: [name], MD;  Location: [facility];  Service: Ophthalmology;  Laterality: Right;  CDE 18.38  . Cataract extraction w/phaco  02/14/2012    Procedure: CATARACT EXTRACTION PHACO AND INTRAOCULAR LENS PLACEMENT (IOC);  Surgeon: [name], MD;  Location: [facility];  Service: Ophthalmology;  Laterality: Left;  CDE 17.20    Family History  Problem Relation Age of Onset  . Coronary artery disease Neg Hx     No premature  . Anesthesia problems Neg Hx   . Hypotension Neg Hx   . Malignant hyperthermia Neg Hx   . Pseudochol deficiency Neg Hx   . CVA Mother   . Cancer - Other Father   . Bronchitis Sister   . Heart Problems Brother   . Carpal tunnel syndrome Sister   . Cancer - Other Brother   . Cancer - Other Brother   . Cancer -  Other Brother   . Gout Brother     History  Substance Use Topics  . Smoking status: Never Smoker   . Smokeless tobacco: Not on file  . Alcohol Use: No    OB History   Grav Para Term Preterm Abortions TAB SAB Ect Mult Living                  Review of Systems  Constitutional: Negative for fever.       10 Systems reviewed and are negative for acute change except as noted in the HPI.  HENT: Negative for congestion.   Eyes: Negative for discharge and redness.  Respiratory: Negative for cough and shortness of breath.   Cardiovascular: Positive for palpitations. Negative for chest pain.  Gastrointestinal: Negative for vomiting and abdominal pain.  Musculoskeletal: Negative for back pain.  Skin: Negative for rash.  Neurological: Negative for syncope, numbness and headaches.  Psychiatric/Behavioral:       No behavior change.    Allergies  Cymbalta; Daypro; Diclofenac sodium; Effexor; Lipitor; Penicillins; Prozac; Sulfa antibiotics; Zocor; Amitriptyline; Milnacipran hcl; and Savella  Home Medications   Current Outpatient Rx  Name  Route  Sig  Dispense  Refill  . acetaminophen (TYLENOL) 500 MG tablet   Oral   Take 500 mg by  mouth every 6 (six) hours as needed. pain         . aspirin EC 81 MG tablet   Oral   Take 81 mg by mouth daily as needed.         . budesonide-formoterol (SYMBICORT) 80-4.5 MCG/ACT inhaler   Inhalation   Inhale 2 puffs into the lungs 2 (two) times daily.   1 Inhaler   0   . Cholecalciferol (VITAMIN D3) 2000 UNITS TABS   Oral   Take 1 tablet by mouth daily.           . fish oil-omega-3 fatty acids 1000 MG capsule   Oral   Take 1 g by mouth daily.          [name] guaiFENesin-codeine (ROBITUSSIN AC) 100-10 MG/5ML syrup   Oral   Take 5 mLs by mouth every 4 (four) hours as needed. cough         . levothyroxine (SYNTHROID, LEVOTHROID) 75 MCG tablet   Oral   Take 75 mcg by mouth daily.           . metoprolol (LOPRESSOR) 50 MG tablet       TAKE 1 TABLET ONCE DAILY.   30 tablet   1   . predniSONE (DELTASONE) 20 MG tablet      Take 2 tablets daily for 3 days, then 1 tablet daily for 3 days, then half tablet daily for 3 days, then STOP.   12 tablet   0   . PROCTOZONE-HC 2.5 % rectal cream   Rectal   Place 1 application rectally as needed.            BP 126/48  Pulse 61  Temp(Src) 97.9 F (36.6 C) (Oral)  Resp 20  Ht 5\' 6"  (1.676 m)  Wt 210 lb (95.255 kg)  BMI 33.91 kg/m2  SpO2 95%  Physical Exam  Nursing note and vitals reviewed. Constitutional: She appears well-developed and well-nourished.  Awake, alert, nontoxic appearance.  HENT:  Head: Normocephalic and atraumatic.  Right Ear: External ear normal.  Left Ear: External ear normal.  Mouth/Throat: Oropharynx is clear and moist.  Eyes: EOM are normal. Pupils are equal, round, and reactive to light.  Neck: Neck supple.  Cardiovascular: Normal rate and intact distal pulses.   Pulmonary/Chest: Effort normal and breath sounds normal. She exhibits no tenderness.  Abdominal: Soft. Bowel sounds are normal. There is no tenderness. There is no rebound.  Musculoskeletal: She exhibits no tenderness.  Baseline ROM, no obvious new focal weakness.  Neurological:  Mental status and motor strength appears baseline for patient and situation.  Skin: No rash noted.  Psychiatric: She has a normal mood and affect.    ED Course  Procedures (including critical care time) Results for orders placed during the hospital encounter of 09/14/12  CBC WITH DIFFERENTIAL      Result Value Range   WBC 4.6  4.0 - 10.5 K/uL   RBC 3.97  3.87 - 5.11 MIL/uL   Hemoglobin 13.0  12.0 - 15.0 g/dL   HCT 62.1  30.8 - 65.7 %   MCV 92.9  78.0 - 100.0 fL   MCH 32.7  26.0 - 34.0 pg   MCHC 35.2  30.0 - 36.0 g/dL   RDW 84.6  96.2 - 95.2 %   Platelets 153  150 - 400 K/uL   Neutrophils Relative 50  43 - 77 %   Neutro Abs 2.3  1.7 - 7.7 K/uL   Lymphocytes Relative 38  12 - 46 %   Lymphs Abs  1.8  0.7 - 4.0 K/uL   Monocytes Relative 9  3 - 12 %   Monocytes Absolute 0.4  0.1 - 1.0 K/uL   Eosinophils Relative 3  0 - 5 %   Eosinophils Absolute 0.1  0.0 - 0.7 K/uL   Basophils Relative 0  0 - 1 %   Basophils Absolute 0.0  0.0 - 0.1 K/uL  BASIC METABOLIC PANEL      Result Value Range   Sodium 140  135 - 145 mEq/L   Potassium 4.5  3.5 - 5.1 mEq/L   Chloride 102  96 - 112 mEq/L   CO2 31  19 - 32 mEq/L   Glucose, Bld 117 (*) 70 - 99 mg/dL   BUN 12  6 - 23 mg/dL   Creatinine, Ser 4.54  0.50 - 1.10 mg/dL   Calcium 9.1  8.4 - 09.8 mg/dL   GFR calc non Af Amer 83 (*) >90 mL/min   GFR calc Af Amer >90  >90 mL/min  TROPONIN I      Result Value Range   Troponin I <0.30  <0.30 ng/mL   Date: 09/15/2012 2308  Rate: 75  Rhythm: normal sinus rhythm and premature atrial contractions (PAC)  QRS Axis: normal  Intervals: normal  ST/T Wave abnormalities: normal  Conduction Disutrbances:none  Narrative Interpretation:  Old EKG Reviewed: changes noted c/w 10/25/2011, rate faster       1. Palpitations       MDM  Patient with palpitations, no chest pain and no shortness of breath. Labs are normal, troponin negative. EKG is normal. Reviewed results with patient and her husband. She will follow up with Dr. Christell Constant. Pt stable in ED with no significant deterioration in condition.The patient appears reasonably screened and/or stabilized for discharge and I doubt any other medical condition or other [facility] requiring further screening, evaluation, or treatment in the ED at this time prior to discharge.  MDM Reviewed: nursing note and vitals Interpretation: labs and ECG           Nicoletta Dress. Colon Branch, MD 09/15/12 [phone]

## 2012-09-18 ENCOUNTER — Telehealth: Payer: Self-pay | Admitting: Family Medicine

## 2012-09-19 NOTE — Telephone Encounter (Signed)
We can see her as planned, if she has more problems we can see her sooner, I had ordered he seen his note from the emergency room

## 2012-09-19 NOTE — Telephone Encounter (Signed)
Patient was seen in the [facility] ED on 09/14/12 and diagnosed with palpitations.  She had a thorough workup and was discharged and told to tell her PCP that she was seen in the ED for palpitations.  She has an appointment scheduled with you on 10/11/12.  Would you like to see her before then?

## 2012-09-20 NOTE — Telephone Encounter (Signed)
Pt notified and will keep f/u appt and f/u sooner if necessary.

## 2012-09-27 ENCOUNTER — Other Ambulatory Visit: Payer: Self-pay | Admitting: Family Medicine

## 2012-10-11 ENCOUNTER — Ambulatory Visit (INDEPENDENT_AMBULATORY_CARE_PROVIDER_SITE_OTHER): Payer: Medicare Other | Admitting: Family Medicine

## 2012-10-11 ENCOUNTER — Encounter: Payer: Self-pay | Admitting: Family Medicine

## 2012-10-11 VITALS — BP 149/64 | HR 62 | Temp 98.0°F | Ht 66.5 in | Wt 210.6 lb

## 2012-10-11 DIAGNOSIS — E785 Hyperlipidemia, unspecified: Secondary | ICD-10-CM

## 2012-10-11 DIAGNOSIS — E039 Hypothyroidism, unspecified: Secondary | ICD-10-CM

## 2012-10-11 DIAGNOSIS — M1990 Unspecified osteoarthritis, unspecified site: Secondary | ICD-10-CM

## 2012-10-11 DIAGNOSIS — M797 Fibromyalgia: Secondary | ICD-10-CM

## 2012-10-11 DIAGNOSIS — IMO0001 Reserved for inherently not codable concepts without codable children: Secondary | ICD-10-CM

## 2012-10-11 LAB — THYROID PANEL WITH TSH
Free Thyroxine Index: 3.8 (ref 1.0–3.9)
T3 Uptake: 30.1 % (ref 22.5–37.0)
T4, Total: 12.7 ug/dL — ABNORMAL HIGH (ref 5.0–12.5)
TSH: 2.09 u[IU]/mL (ref 0.350–4.500)

## 2012-10-11 NOTE — Patient Instructions (Addendum)
Fall precautions discussed Continue current meds and therapeutic lifestyle changes Please check with your insurance regarding getting the shingles shot, Zostavax

## 2012-10-11 NOTE — Progress Notes (Signed)
  Subjective:    Patient ID: Jenna [name], female    DOB: 06-17-1935, 77 y.o.   MRN: [id number]  HPI This patient presents for recheck of multiple medical problems. No one accompanies the patient today.  Patient Active Problem List   Diagnosis Date Noted  . Hyperglycemia, drug-induced 05/29/2012  . Acute bronchitis 05/28/2012  . Acute respiratory failure with hypercapnia 05/28/2012  . OA (osteoarthritis)   . Unspecified hypothyroidism   . Fibromyalgia   . Osteoporosis   . PUD (peptic ulcer disease)   . DDD (degenerative disc disease)   . Herpes zoster   . AF (atrial fibrillation)   . Cataract   . HYPERLIPIDEMIA 11/14/2008  . Atrial fibrillation 11/14/2008    In addition, patient does say she had 2 falls during the winter months mostly because she slipped on ice and snow. She also has had a visit to the emergency room because of her heart racing. There was no change in the medication as a result of that visit. There was no sign of heart attack.  The allergies, current medications, past medical history, surgical history, family and social history are reviewed.  Immunizations reviewed.  Health maintenance reviewed.  The following items are outstanding: Pending--patient will check with her insurance regarding Zostavax. Her insurance will only allow mammograms every 2 years.      Review of Systems  Constitutional: Positive for fatigue.  HENT: Positive for postnasal drip (due to allergies).   Eyes: Negative.   Respiratory: Positive for shortness of breath (with palpitations).   Cardiovascular: Positive for palpitations and leg swelling.  Gastrointestinal: Positive for constipation (almost daily).  Endocrine: Negative.   Genitourinary: Negative.   Musculoskeletal: Positive for back pain (LBP) and arthralgias (shoulders, R hip).  Skin: Negative.   Allergic/Immunologic: Positive for environmental allergies (seasonal).  Psychiatric/Behavioral: Positive for sleep disturbance  (nightly).   See  outside labs from hospital emergency room visit in April     Objective:   Physical Exam BP 149/64  Pulse 62  Temp(Src) 98 F (36.7 C) (Oral)  Ht 5' 6.5" (1.689 m)  Wt 210 lb 9.6 oz (95.528 kg)  BMI 33.49 kg/m2  The patient appeared well nourished and normally developed, alert and oriented to time and place. Speech, behavior and judgement appear normal. Vital signs as documented.  Head exam is unremarkable. No scleral icterus or pallor noted. Some nasal congestion bilaterally.  Neck is without jugular venous distension, thyromegally, or carotid bruits. Carotid upstrokes are brisk bilaterally. No cervical adenopathy. Lungs are clear anteriorly and posteriorly to auscultation. Normal respiratory effort. Cardiac exam reveals regular rate and rhythm at 72 per minute. First and second heart sounds normal.  No murmurs, rubs or gallops.  Abdominal exam reveals normal bowl sounds, no masses, no organomegaly and no aortic enlargement. No inguinal adenopathy. Extremities are nonedematous and both femoral and pedal pulses are normal. Skin without pallor or jaundice.  Warm and dry, without rash. Neurologic exam reveals slightly decreased deep tendon reflexes.           Assessment & Plan:  1. HYPERLIPIDEMIA  2. hypothyroidism - Thyroid Panel With TSH  3. OA (osteoarthritis)  4. Fibromyalgia  Patient Instructions  Fall precautions discussed Continue current meds and therapeutic lifestyle changes Please check with your insurance regarding getting the shingles shot, Zostavax

## 2012-10-12 ENCOUNTER — Other Ambulatory Visit: Payer: Self-pay | Admitting: Family Medicine

## 2012-10-19 ENCOUNTER — Other Ambulatory Visit: Payer: Self-pay | Admitting: Nurse Practitioner

## 2012-10-26 ENCOUNTER — Other Ambulatory Visit (INDEPENDENT_AMBULATORY_CARE_PROVIDER_SITE_OTHER): Payer: Medicare Other

## 2012-10-26 DIAGNOSIS — Z1212 Encounter for screening for malignant neoplasm of rectum: Secondary | ICD-10-CM

## 2012-10-31 ENCOUNTER — Telehealth: Payer: Self-pay | Admitting: Family Medicine

## 2012-11-02 NOTE — Telephone Encounter (Signed)
LMOM

## 2012-11-03 ENCOUNTER — Telehealth: Payer: Self-pay | Admitting: *Deleted

## 2012-11-03 NOTE — Telephone Encounter (Signed)
Notified patient of lab results and gave her an appt for 6-16 to have CBC done

## 2012-11-06 ENCOUNTER — Other Ambulatory Visit (INDEPENDENT_AMBULATORY_CARE_PROVIDER_SITE_OTHER): Payer: Medicare Other

## 2012-11-06 DIAGNOSIS — R7989 Other specified abnormal findings of blood chemistry: Secondary | ICD-10-CM | POA: Diagnosis not present

## 2012-11-06 LAB — POCT CBC
Granulocyte percent: 69 %G (ref 37–80)
Lymph, poc: 1.3 (ref 0.6–3.4)
MCH, POC: 32.8 pg — AB (ref 27–31.2)
MCHC: 35.2 g/dL (ref 31.8–35.4)
MCV: 93.4 fL (ref 80–97)
Platelet Count, POC: 157 10*3/uL (ref 142–424)
RDW, POC: 12.4 %
WBC: 5 10*3/uL (ref 4.6–10.2)

## 2012-11-13 ENCOUNTER — Encounter: Payer: Self-pay | Admitting: *Deleted

## 2012-11-30 ENCOUNTER — Other Ambulatory Visit: Payer: Self-pay | Admitting: Nurse Practitioner

## 2012-12-04 NOTE — Telephone Encounter (Signed)
Is there a certain number of days that this should be taken?

## 2013-01-19 ENCOUNTER — Ambulatory Visit: Payer: Medicare Other | Admitting: Cardiology

## 2013-01-22 ENCOUNTER — Other Ambulatory Visit: Payer: Self-pay | Admitting: Family Medicine

## 2013-02-06 ENCOUNTER — Ambulatory Visit: Payer: Medicare Other | Admitting: Cardiology

## 2013-02-13 ENCOUNTER — Ambulatory Visit: Payer: Medicare Other | Admitting: Cardiology

## 2013-02-19 ENCOUNTER — Other Ambulatory Visit: Payer: Self-pay | Admitting: Family Medicine

## 2013-02-20 ENCOUNTER — Encounter: Payer: Self-pay | Admitting: Cardiology

## 2013-02-20 ENCOUNTER — Ambulatory Visit (INDEPENDENT_AMBULATORY_CARE_PROVIDER_SITE_OTHER): Payer: Medicare Other | Admitting: Cardiology

## 2013-02-20 VITALS — BP 158/74 | HR 68 | Ht 66.0 in | Wt 214.1 lb

## 2013-02-20 DIAGNOSIS — I4891 Unspecified atrial fibrillation: Secondary | ICD-10-CM

## 2013-02-20 DIAGNOSIS — R002 Palpitations: Secondary | ICD-10-CM

## 2013-02-20 DIAGNOSIS — E785 Hyperlipidemia, unspecified: Secondary | ICD-10-CM

## 2013-02-20 NOTE — Assessment & Plan Note (Signed)
Continues on Omega 3 supplements, followed by Dr. Christell Constant.

## 2013-02-20 NOTE — Patient Instructions (Addendum)
Your physician recommends that you schedule a follow-up appointment in: 6 MONTHS  Your physician has recommended that you wear a holter monitor. Holter monitors are medical devices that record the heart's electrical activity. Doctors most often use these monitors to diagnose arrhythmias. Arrhythmias are problems with the speed or rhythm of the heartbeat. The monitor is a small, portable device. You can wear one while you do your normal daily activities. This is usually used to diagnose what is causing palpitations/syncope (passing out).48 HOURS  Your physician has recommended you make the following change in your medication:   1) TAKE YOUR ASPIRIN 81MG  DAILY AS ADVISED   WE WILL CALL YOU WITH YOUR TEST RESULTS/INSTRUCTIONS/NEXT STEPS ONCE RECEIVED BY THE PROVIDER

## 2013-02-20 NOTE — Progress Notes (Signed)
Clinical Summary Ms. Stang is a 77 y.o.female last seen in August 2013. Interval records reviewed finding ER visit back in April with palpitations. She was found to be in sinus rhythm by ECG at that time with PACs, normal cardiac markers. No clear evidence of atrial fibrillation.  She continues to report intermittent palpitations, particular in the evenings. Despite this we have had no clearly documented atrial fibrillation in the last few years. She continues on aspirin and Lopressor. We have discussed merits of anticoagulant therapies for stroke risk reduction if in fact she is having PAF.  She does have multiple drug allergies, and has been hesitant to make any medication changes.   Allergies  Allergen Reactions  . Cymbalta [Duloxetine Hcl] Nausea Only  . Daypro [Oxaprozin]   . Diclofenac Sodium   . Effexor [Venlafaxine Hydrochloride]   . Lipitor [Atorvastatin Calcium] Other (See Comments)    Leg aches  . Penicillins   . Prozac [Fluoxetine Hcl]   . Sulfa Antibiotics   . Zocor [Simvastatin] Other (See Comments)    Hip pain  . Amitriptyline Rash  . Milnacipran Hcl Rash  . Savella [Milnacipran Hcl] Rash    Current Outpatient Prescriptions  Medication Sig Dispense Refill  . aspirin EC 81 MG tablet Take 81 mg by mouth daily as needed.      . Cholecalciferol (VITAMIN D3) 2000 UNITS TABS Take 1 tablet by mouth daily.        . colchicine (COLCRYS) 0.6 MG tablet       . fish oil-omega-3 fatty acids 1000 MG capsule Take 1 g by mouth daily.       [name] levothyroxine (SYNTHROID, LEVOTHROID) 75 MCG tablet TAKE 1 TABLET ONCE DAILY.  30 tablet  11  . meloxicam (MOBIC) 7.5 MG tablet TAKE 1 TABLET DAILY AFTER A MEAL AS NEEDED.  30 tablet  0  . metoprolol (LOPRESSOR) 50 MG tablet TAKE 1 TABLET ONCE DAILY.  30 tablet  5  . PROCTOZONE-HC 2.5 % rectal cream APPLY 1 APPLICATORFUL 2 TIMES A DAY AS NEEDED.  30 g  4   No current facility-administered medications for this visit.    Past Medical  History  Diagnosis Date  . OA (osteoarthritis)   . Hypothyroidism   . Fibromyalgia   . Osteoporosis   . PUD (peptic ulcer disease)   . DDD (degenerative disc disease)   . Herpes zoster   . AF (atrial fibrillation)   . Cataract   . Hyperlipidemia   . History of skin cancer     Social History Ms. Kinsel reports that she has never smoked. She does not have any smokeless tobacco history on file. Ms. Orchard reports that she does not drink alcohol.  Review of Systems Otherwise negative except as outlined.  Physical Examination Filed Vitals:   02/20/13 0954  BP: 158/74  Pulse: 68   Filed Weights   02/20/13 0954  Weight: 214 lb 1.9 oz (97.124 kg)    Overweight woman in no acute distress.  HEENT: Conjunctiva and lids normal, oropharynx with moist mucosa.  Neck: Supple, no carotid bruits or thyromegaly.  Lungs: Clear to auscultation, nonlabored.  Cardiac: Regular rate and rhythm, no S3.  Extremities: No pitting edema. Skin: Warm and dry. Muscular skeletal: No kyphosis. Neuropsychiatric: Alert and oriented x3, affect appropriate.   Problem List and Plan   Atrial fibrillation The patient has intermittent palpitations but no firm diagnosis of recurring atrial fibrillation as yet. Plan will be to obtain a 48-hour Holter  monitor. If we see definitive atrial fibrillation, will discuss with her whether she wants to consider an NOAC versus aspirin.  HYPERLIPIDEMIA Continues on Omega 3 supplements, followed by Dr. Christell Constant.    Jonelle Sidle, M.D., F.A.C.C.

## 2013-02-20 NOTE — Assessment & Plan Note (Signed)
The patient has intermittent palpitations but no firm diagnosis of recurring atrial fibrillation as yet. Plan will be to obtain a 48-hour Holter monitor. If we see definitive atrial fibrillation, will discuss with her whether she wants to consider an NOAC versus aspirin.

## 2013-02-21 ENCOUNTER — Encounter: Payer: Self-pay | Admitting: Family Medicine

## 2013-02-21 ENCOUNTER — Ambulatory Visit (INDEPENDENT_AMBULATORY_CARE_PROVIDER_SITE_OTHER): Payer: Medicare Other | Admitting: Family Medicine

## 2013-02-21 VITALS — BP 129/61 | HR 63 | Temp 98.4°F | Ht 66.0 in | Wt 212.0 lb

## 2013-02-21 DIAGNOSIS — E785 Hyperlipidemia, unspecified: Secondary | ICD-10-CM | POA: Diagnosis not present

## 2013-02-21 DIAGNOSIS — Z23 Encounter for immunization: Secondary | ICD-10-CM | POA: Diagnosis not present

## 2013-02-21 DIAGNOSIS — IMO0001 Reserved for inherently not codable concepts without codable children: Secondary | ICD-10-CM

## 2013-02-21 DIAGNOSIS — M810 Age-related osteoporosis without current pathological fracture: Secondary | ICD-10-CM

## 2013-02-21 DIAGNOSIS — M797 Fibromyalgia: Secondary | ICD-10-CM

## 2013-02-21 DIAGNOSIS — M1990 Unspecified osteoarthritis, unspecified site: Secondary | ICD-10-CM | POA: Insufficient documentation

## 2013-02-21 DIAGNOSIS — I4891 Unspecified atrial fibrillation: Secondary | ICD-10-CM

## 2013-02-21 DIAGNOSIS — M109 Gout, unspecified: Secondary | ICD-10-CM

## 2013-02-21 LAB — POCT CBC
HCT, POC: 40.4 % (ref 37.7–47.9)
Hemoglobin: 13.5 g/dL (ref 12.2–16.2)
Lymph, poc: 1.5 (ref 0.6–3.4)
MCH, POC: 31.2 pg (ref 27–31.2)
MCV: 93.2 fL (ref 80–97)
RBC: 4.3 M/uL (ref 4.04–5.48)

## 2013-02-21 NOTE — Patient Instructions (Signed)
Do not forget to get your eye exam. Your colonoscopy will be due in the spring of 2015. Always be careful and did not put yourself at risk for falling Next mammogram will be due in February 2015 Continue aggressive therapeutic lifestyle changes which include diet and exercise Continue current medication He'll get a Prevnar pneumonia shot today

## 2013-02-21 NOTE — Progress Notes (Signed)
Subjective:    Patient ID: Jenna [name], female    DOB: 1935-11-03, 77 y.o.   MRN: [id number]  HPI PATIENT here for follow up and management of chronic medical problems. Patient just saw the cardiologist yesterday and he is scheduling her for a 48 hour Holter monitor because of her palpitations. She will be due for colonoscopy with the gastroenterologist in the spring of 2015. She is also due for an eye exam. She is going to arrange this. She indicates that she does have occasional blood in the stool and that this is related to her constipation. She had a positive FOBT in June and this has not been repeated so it will be repeated today.    Patient Active Problem List   Diagnosis Date Noted  . Fibromyalgia   . Osteoporosis   . HYPERLIPIDEMIA 11/14/2008  . Atrial fibrillation 11/14/2008   Outpatient Encounter Prescriptions as of 02/21/2013  Medication Sig Dispense Refill  . aspirin EC 81 MG tablet Take 81 mg by mouth daily as needed.      . Cholecalciferol (VITAMIN D3) 2000 UNITS TABS Take 1 tablet by mouth daily.        . colchicine (COLCRYS) 0.6 MG tablet       . fish oil-omega-3 fatty acids 1000 MG capsule Take 1 g by mouth daily.       [name] levothyroxine (SYNTHROID, LEVOTHROID) 75 MCG tablet TAKE 1 TABLET ONCE DAILY.  30 tablet  11  . meloxicam (MOBIC) 7.5 MG tablet TAKE 1 TABLET DAILY AFTER A MEAL AS NEEDED.  30 tablet  0  . metoprolol (LOPRESSOR) 50 MG tablet TAKE 1 TABLET ONCE DAILY.  30 tablet  5  . PROCTOZONE-HC 2.5 % rectal cream APPLY 1 APPLICATORFUL 2 TIMES A DAY AS NEEDED.  30 g  4   No facility-administered encounter medications on file as of 02/21/2013.      Review of Systems  Constitutional: Negative.   HENT: Negative.   Eyes: Negative.   Respiratory: Negative.   Cardiovascular: Positive for palpitations (cardiologist to apply a heart monitor) and leg swelling.  Gastrointestinal: Negative.   Endocrine: Negative.   Genitourinary: Negative.   Musculoskeletal:  Negative.   Skin: Negative.   Allergic/Immunologic: Negative.   Neurological: Negative.   Hematological: Negative.   Psychiatric/Behavioral: Negative.        Objective:   Physical Exam  Nursing note and vitals reviewed. Constitutional: She is oriented to person, place, and time. She appears well-developed and well-nourished. No distress.  HENT:  Head: Normocephalic and atraumatic.  Right Ear: External ear normal.  Left Ear: External ear normal.  Nose: Nose normal.  Mouth/Throat: No oropharyngeal exudate.  Eyes: Conjunctivae and EOM are normal. Pupils are equal, round, and reactive to light. Right eye exhibits no discharge. Left eye exhibits no discharge. No scleral icterus.  Neck: Normal range of motion. Neck supple. No JVD present. No thyromegaly present.  Cardiovascular: Normal rate, regular rhythm, normal heart sounds and intact distal pulses.  Exam reveals no gallop and no friction rub.   No murmur heard. Today the heart has a normal rate and rhythm at 72 per minute  Pulmonary/Chest: Effort normal and breath sounds normal. She has no wheezes. She has no rales. She exhibits no tenderness.  Abdominal: Soft. Bowel sounds are normal. She exhibits no mass. There is tenderness (there is slight epigastric tenderness). There is no rebound and no guarding.  Musculoskeletal: Normal range of motion. She exhibits no edema and no tenderness.  Lymphadenopathy:    She has no cervical adenopathy.  Neurological: She is alert and oriented to person, place, and time. She has normal reflexes. No cranial nerve deficit.  Skin: Skin is warm and dry.  Psychiatric: She has a normal mood and affect. Her behavior is normal. Judgment and thought content normal.   BP 129/61  Pulse 63  Temp(Src) 98.4 F (36.9 C) (Oral)  Ht 5\' 6"  (1.676 m)  Wt 212 lb (96.163 kg)  BMI 34.23 kg/m2        Assessment & Plan:   1. HYPERLIPIDEMIA   2. Atrial fibrillation   3. Fibromyalgia   4. Osteoporosis   5.  Gout   6. Osteoarthritis    Orders Placed This Encounter  Procedures  . Hepatic function panel  . BMP8+EGFR  . NMR, lipoprofile  . Vit D  25 hydroxy (rtn osteoporosis monitoring)  . POCT CBC   Patient Instructions  Do not forget to get your eye exam. Your colonoscopy will be due in the spring of 2015. Always be careful and did not put yourself at risk for falling Next mammogram will be due in February 2015 Continue aggressive therapeutic lifestyle changes which include diet and exercise Continue current medication He'll get a Prevnar pneumonia shot today    Nyra Capes MD

## 2013-02-23 ENCOUNTER — Other Ambulatory Visit: Payer: Self-pay | Admitting: Family Medicine

## 2013-02-23 ENCOUNTER — Other Ambulatory Visit (INDEPENDENT_AMBULATORY_CARE_PROVIDER_SITE_OTHER): Payer: Medicare Other

## 2013-02-23 DIAGNOSIS — Z1212 Encounter for screening for malignant neoplasm of rectum: Secondary | ICD-10-CM | POA: Diagnosis not present

## 2013-02-23 LAB — NMR, LIPOPROFILE
HDL Cholesterol by NMR: 54 mg/dL (ref >=40)
HDL Particle Number: 33.7 umol/L (ref >=30.5)
LDL Particle Number: 2143 nmol/L — ABNORMAL HIGH (ref <1000)
LDL Size: 20.9 nm (ref >20.5)
LDLC SERPL CALC-MCNC: 132 mg/dL — ABNORMAL HIGH (ref <100)
Triglycerides by NMR: 161 mg/dL — ABNORMAL HIGH (ref <150)

## 2013-02-23 LAB — BMP8+EGFR
Calcium: 9.2 mg/dL (ref 8.6–10.2)
Creatinine, Ser: 0.73 mg/dL (ref 0.57–1.00)
GFR calc Af Amer: 93 mL/min/{1.73_m2} (ref >59)
GFR calc non Af Amer: 81 mL/min/{1.73_m2} (ref >59)
Glucose: 96 mg/dL (ref 65–99)
Potassium: 4.7 mmol/L (ref 3.5–5.2)
Sodium: 144 mmol/L (ref 134–144)

## 2013-02-23 LAB — HEPATIC FUNCTION PANEL
ALT: 11 IU/L (ref 0–32)
AST: 14 IU/L (ref 0–40)
Alkaline Phosphatase: 78 IU/L (ref 39–117)
Bilirubin, Direct: 0.2 mg/dL (ref 0.00–0.40)
Total Bilirubin: 0.7 mg/dL (ref 0.0–1.2)

## 2013-02-25 LAB — FECAL OCCULT BLOOD, IMMUNOCHEMICAL: Fecal Occult Bld: NEGATIVE

## 2013-02-26 ENCOUNTER — Ambulatory Visit (HOSPITAL_COMMUNITY)
Admission: RE | Admit: 2013-02-26 | Discharge: 2013-02-26 | Disposition: A | Discharge status: Home / Self Care | Payer: Medicare Other | Source: Ambulatory Visit | Attending: Cardiology | Admitting: Cardiology

## 2013-02-26 DIAGNOSIS — R002 Palpitations: Secondary | ICD-10-CM | POA: Diagnosis not present

## 2013-02-26 NOTE — Progress Notes (Signed)
Pt aware of all labs 

## 2013-03-02 ENCOUNTER — Other Ambulatory Visit: Payer: Self-pay | Admitting: *Deleted

## 2013-03-02 DIAGNOSIS — R002 Palpitations: Secondary | ICD-10-CM

## 2013-03-28 ENCOUNTER — Ambulatory Visit (INDEPENDENT_AMBULATORY_CARE_PROVIDER_SITE_OTHER): Payer: Medicare Other | Admitting: Family Medicine

## 2013-03-28 ENCOUNTER — Encounter: Payer: Self-pay | Admitting: Family Medicine

## 2013-03-28 VITALS — BP 144/60 | HR 64 | Temp 97.2°F | Ht 66.0 in | Wt 211.0 lb

## 2013-03-28 DIAGNOSIS — R109 Unspecified abdominal pain: Secondary | ICD-10-CM

## 2013-03-28 DIAGNOSIS — K625 Hemorrhage of anus and rectum: Secondary | ICD-10-CM | POA: Diagnosis not present

## 2013-03-28 DIAGNOSIS — R141 Gas pain: Secondary | ICD-10-CM | POA: Diagnosis not present

## 2013-03-28 DIAGNOSIS — IMO0001 Reserved for inherently not codable concepts without codable children: Secondary | ICD-10-CM

## 2013-03-28 LAB — POCT CBC
Granulocyte percent: 74.8 %G (ref 37–80)
Hemoglobin: 13.5 g/dL (ref 12.2–16.2)
Lymph, poc: 1.3 (ref 0.6–3.4)
MCHC: 33.1 g/dL (ref 31.8–35.4)
MPV: 8.5 fL (ref 0–99.8)
POC Granulocyte: 4.1 (ref 2–6.9)
POC LYMPH PERCENT: 23.5 %L (ref 10–50)
RDW, POC: 12.6 %
WBC: 5.5 10*3/uL (ref 4.6–10.2)

## 2013-03-28 NOTE — Patient Instructions (Signed)
Continue to drink plenty of fluids We will arrange a visit with a gastroenterologist Return the FOBT We will call you with the results of the CBC once this is obtained

## 2013-03-28 NOTE — Progress Notes (Signed)
Subjective:    Patient ID: Jenna [name], female    DOB: [birth year], 77 y.o.   MRN: [id number]  HPI Patient here today for rectal bleeding and/or hemmorhoids. The patient has been having trouble with hemorrhoids off and on for a long time. This has been worse recently with more bleeding and "increase misery". Again she is having bleeding problems she seems to have more bloating and discomfort. The bleeding is also worse when she is more constipated. She still takes meloxicam but only rarely. She has a history of having seen Dr. Karilyn Cota in the past. Her next colonoscopy is due in March of 2015.    Patient Active Problem List   Diagnosis Date Noted  . Gout 02/21/2013  . Osteoarthritis 02/21/2013  . Fibromyalgia   . Osteoporosis   . HYPERLIPIDEMIA 11/14/2008  . Atrial fibrillation 11/14/2008   Outpatient Encounter Prescriptions as of 03/28/2013  Medication Sig  . aspirin EC 81 MG tablet Take 81 mg by mouth daily as needed.  . Cholecalciferol (VITAMIN D3) 2000 UNITS TABS Take 1 tablet by mouth daily.    [name] COLCRYS 0.6 MG tablet TAKE (1) TABLET BY MOUTH ONCE DAILY.  . fish oil-omega-3 fatty acids 1000 MG capsule Take 1 g by mouth daily.   [name] levothyroxine (SYNTHROID, LEVOTHROID) 75 MCG tablet TAKE 1 TABLET ONCE DAILY.  . meloxicam (MOBIC) 7.5 MG tablet TAKE 1 TABLET ONCE DAILY AFTER A MEAL AS NEEDED.  . metoprolol (LOPRESSOR) 50 MG tablet TAKE 1 TABLET ONCE DAILY.  [name] PROCTOZONE-HC 2.5 % rectal cream APPLY 1 APPLICATORFUL 2 TIMES A DAY AS NEEDED.  . [DISCONTINUED] colchicine (COLCRYS) 0.6 MG tablet     Review of Systems  Constitutional: Negative.   HENT: Negative.   Eyes: Negative.   Respiratory: Negative.   Cardiovascular: Negative.   Gastrointestinal: Positive for blood in stool and anal bleeding.  Endocrine: Negative.   Genitourinary: Negative.   Musculoskeletal: Positive for back pain and myalgias.  Skin: Negative.   Allergic/Immunologic: Negative.   Neurological: Negative.    Hematological: Negative.   Psychiatric/Behavioral: Negative.        Objective:   Physical Exam  Nursing note and vitals reviewed. Constitutional: She is oriented to person, place, and time. She appears well-developed and well-nourished. No distress.  Abdominal: Soft. Bowel sounds are normal. She exhibits no mass. There is no tenderness. There is no rebound and no guarding.  Rectal exam reveals some small hemorrhoids at the anus. There were no rectal masses. There was definite tenderness within the anal area with the exam   Musculoskeletal: Normal range of motion. She exhibits no edema.  Neurological: She is alert and oriented to person, place, and time.  Skin: Skin is warm and dry. No rash noted. No erythema.  Psychiatric: She has a normal mood and affect. Her behavior is normal. Judgment and thought content normal.   BP 144/60  Pulse 64  Temp(Src) 97.2 F (36.2 C) (Oral)  Ht 5\' 6"  (1.676 m)  Wt 211 lb (95.709 kg)  BMI 34.07 kg/m2  A Hemoccult was done with the exam today and this was negative.      Assessment & Plan:   1. Rectal bleeding   2. Abdominal pain, unspecified site   3. Gas    A CBC will be done today and she will return and FOBT  Patient Instructions  Continue to drink plenty of fluids We will arrange a visit with a gastroenterologist Return the FOBT We will call you with the  results of the CBC once this is obtained    Nyra Capes MD

## 2013-04-05 NOTE — Progress Notes (Signed)
Has a New Patient apt scheduled with Dorene Ar, NP on 12/001/14.

## 2013-04-11 ENCOUNTER — Encounter (INDEPENDENT_AMBULATORY_CARE_PROVIDER_SITE_OTHER): Payer: Self-pay | Admitting: *Deleted

## 2013-04-23 ENCOUNTER — Encounter (INDEPENDENT_AMBULATORY_CARE_PROVIDER_SITE_OTHER): Payer: Self-pay | Admitting: Internal Medicine

## 2013-04-23 ENCOUNTER — Ambulatory Visit (INDEPENDENT_AMBULATORY_CARE_PROVIDER_SITE_OTHER): Payer: Medicare Other | Admitting: Internal Medicine

## 2013-04-23 VITALS — BP 134/56 | HR 60 | Temp 97.9°F | Ht 63.0 in | Wt 214.4 lb

## 2013-04-23 DIAGNOSIS — K625 Hemorrhage of anus and rectum: Secondary | ICD-10-CM | POA: Diagnosis not present

## 2013-04-23 DIAGNOSIS — K5900 Constipation, unspecified: Secondary | ICD-10-CM | POA: Diagnosis not present

## 2013-04-23 NOTE — Progress Notes (Signed)
Subjective:     Patient ID: Jenna [name], female   DOB: 22-May-1936, 77 y.o.   MRN: [id number]  HPI Referred to our office for rectal bleeding and constipation. She says she thinks she has hemorrhoids. She has not seen any bleeding since last week x 1. The bleeding always occurs when she is constipated. Rectal exam by Dr Christell Constant was negative for blood. Three stool cards sent home with patient but those results are not available. The constipation is not new. She has had constipation for years and years. She will drink Prune juice when she is constipated.She has tried Miralax which she could not take.  Stools are brown in color. She usually has a BM every 2-3 days. Appetite is good. No weight loss. She does have rectal bleeding. She will have itching and burning to her rectum.  She says she really has no new symptoms. She occasionally has dysphagia. Occurs rarely.  No family hx of colon cancer.  08/06/2001 EGD/Colonoscopy: dysphagia, rectal bleeding FINAL DIAGNOSES:  1. Mild tubular narrowing to cervical esophagus without mucosal  abnormalities.  2. Esophagus dilated by passing 54 and 56 French Maloney dilators resulting  in small mucosal tear.  3. Normal examination of the stomach, first and second part of the duodenum.  4. External hemorrhoids one of which is thrombosed, otherwise colon exam was  normal.  RECOMMENDATIONS:  1. High fiber diet.  2. Anusol HC cream to be applied in the perianal area b.i.d. for 10-14 days.  3. Sitz baths once to twice daily until thrombosed hemorrhoid is resolved.  4. If the patient remains with dysphagia she will call our office in which  case we would consider barium swallow plus a pill study.      Review of Systems  See hpi Current Outpatient Prescriptions  Medication Sig Dispense Refill  . aspirin EC 81 MG tablet Take 81 mg by mouth daily as needed.      . Cholecalciferol (VITAMIN D3) 2000 UNITS TABS Take 1 tablet by mouth daily.        . fish  oil-omega-3 fatty acids 1000 MG capsule Take 1 g by mouth daily.       [name] levothyroxine (SYNTHROID, LEVOTHROID) 75 MCG tablet TAKE 1 TABLET ONCE DAILY.  30 tablet  11  . meloxicam (MOBIC) 7.5 MG tablet TAKE 1 TABLET ONCE DAILY AFTER A MEAL AS NEEDED.  30 tablet  2  . metoprolol (LOPRESSOR) 50 MG tablet TAKE 1 TABLET ONCE DAILY.  30 tablet  5  . PROCTOZONE-HC 2.5 % rectal cream APPLY 1 APPLICATORFUL 2 TIMES A DAY AS NEEDED.  30 g  4   No current facility-administered medications for this visit.   Past Medical History  Diagnosis Date  . OA (osteoarthritis)   . Hypothyroidism   . Fibromyalgia   . Osteoporosis   . PUD (peptic ulcer disease)   . DDD (degenerative disc disease)   . Herpes zoster   . AF (atrial fibrillation)   . Cataract   . Hyperlipidemia   . History of skin cancer    Past Surgical History  Procedure Laterality Date  . Abdominal hysterectomy    . Cesarean section    . Breast biopsy      Right  . Cataract extraction w/phaco  10/28/2011    Procedure: CATARACT EXTRACTION PHACO AND INTRAOCULAR LENS PLACEMENT (IOC);  Surgeon: [name], MD;  Location: [facility];  Service: Ophthalmology;  Laterality: Right;  CDE 18.38  . Cataract extraction w/phaco  02/14/2012    Procedure: CATARACT EXTRACTION PHACO AND INTRAOCULAR LENS PLACEMENT (IOC);  Surgeon: [name], MD;  Location: [facility];  Service: Ophthalmology;  Laterality: Left;  CDE 17.20   Allergies  Allergen Reactions  . Cymbalta [Duloxetine Hcl] Nausea Only  . Daypro [Oxaprozin]   . Diclofenac Sodium   . Effexor [Venlafaxine Hydrochloride]   . Lipitor [Atorvastatin Calcium] Other (See Comments)    Leg aches  . Penicillins   . Prozac [Fluoxetine Hcl]   . Sulfa Antibiotics   . Zocor [Simvastatin] Other (See Comments)    Hip pain  . Amitriptyline Rash  . Milnacipran Hcl Rash  . Savella [Milnacipran Hcl] Rash        Objective:   Physical Exam  Filed Vitals:   04/23/13 1113  BP: 134/56  Pulse: 60  Temp: 97.9 F  (36.6 C)  Height: 5\' 3"  (1.6 m)  Weight: 214 lb 6.4 oz (97.251 kg)   Alert and oriented. Skin warm and dry. Oral mucosa is moist.   . Sclera anicteric, conjunctivae is pink. Thyroid not enlarged. No cervical lymphadenopathy. Lungs clear. Heart regular rate and rhythm.  Abdomen is soft. Bowel sounds are positive. No hepatomegaly. No abdominal masses felt. No tenderness. Small amount edema to lower extremities.       Assessment:    Constipation chronic. Hx of rectal bleeding when she is constipated. Probably hemorroidal. Colonic neoplasm is also in the differential.    Plan:    Colonoscopy with Dr. Karilyn Cota.The risks and benefits such as perforation, bleeding, and infection were reviewed with the patient and is agreeable.

## 2013-04-23 NOTE — Patient Instructions (Signed)
Colonoscopy. The risks and benefits such as perforation, bleeding, and infection were reviewed with the patient and is agreeable. Continue the ProctoFoam. Drink Prune juice daily.

## 2013-04-24 ENCOUNTER — Telehealth (INDEPENDENT_AMBULATORY_CARE_PROVIDER_SITE_OTHER): Payer: Self-pay | Admitting: *Deleted

## 2013-04-24 ENCOUNTER — Other Ambulatory Visit (INDEPENDENT_AMBULATORY_CARE_PROVIDER_SITE_OTHER): Payer: Self-pay | Admitting: *Deleted

## 2013-04-24 DIAGNOSIS — K5900 Constipation, unspecified: Secondary | ICD-10-CM

## 2013-04-24 DIAGNOSIS — Z1211 Encounter for screening for malignant neoplasm of colon: Secondary | ICD-10-CM

## 2013-04-24 MED ORDER — PEG-KCL-NACL-NASULF-NA ASC-C 100 G PO SOLR: 1.0000 | Freq: Once | ORAL | Status: DC

## 2013-04-24 NOTE — Telephone Encounter (Signed)
Patient needs movi preps

## 2013-05-14 ENCOUNTER — Other Ambulatory Visit: Payer: Self-pay | Admitting: Family Medicine

## 2013-05-22 ENCOUNTER — Encounter (HOSPITAL_COMMUNITY): Payer: Self-pay | Admitting: Pharmacy Technician

## 2013-05-31 ENCOUNTER — Encounter (HOSPITAL_COMMUNITY): Payer: Self-pay

## 2013-05-31 ENCOUNTER — Ambulatory Visit (HOSPITAL_COMMUNITY)
Admission: RE | Admit: 2013-05-31 | Discharge: 2013-05-31 | Disposition: A | Discharge status: Home / Self Care | Payer: Medicare Other | Source: Ambulatory Visit | Attending: Internal Medicine | Admitting: Internal Medicine

## 2013-05-31 ENCOUNTER — Encounter (HOSPITAL_COMMUNITY)
Admission: RE | Disposition: A | Discharge status: Home / Self Care | Payer: Self-pay | Source: Ambulatory Visit | Attending: Internal Medicine

## 2013-05-31 DIAGNOSIS — E785 Hyperlipidemia, unspecified: Secondary | ICD-10-CM | POA: Insufficient documentation

## 2013-05-31 DIAGNOSIS — D128 Benign neoplasm of rectum: Secondary | ICD-10-CM | POA: Insufficient documentation

## 2013-05-31 DIAGNOSIS — K5900 Constipation, unspecified: Secondary | ICD-10-CM | POA: Diagnosis not present

## 2013-05-31 DIAGNOSIS — K921 Melena: Secondary | ICD-10-CM | POA: Diagnosis not present

## 2013-05-31 DIAGNOSIS — D126 Benign neoplasm of colon, unspecified: Secondary | ICD-10-CM | POA: Diagnosis not present

## 2013-05-31 DIAGNOSIS — K644 Residual hemorrhoidal skin tags: Secondary | ICD-10-CM | POA: Diagnosis not present

## 2013-05-31 DIAGNOSIS — D129 Benign neoplasm of anus and anal canal: Secondary | ICD-10-CM

## 2013-05-31 HISTORY — PX: COLONOSCOPY: SHX5424

## 2013-05-31 SURGERY — COLONOSCOPY
Anesthesia: Moderate Sedation

## 2013-05-31 MED ORDER — MIDAZOLAM HCL 5 MG/5ML IJ SOLN
INTRAMUSCULAR | Status: AC
  Filled 2013-05-31: qty 10

## 2013-05-31 MED ORDER — STERILE WATER FOR IRRIGATION IR SOLN
Status: DC | PRN
  Administered 2013-05-31: 15:00:00

## 2013-05-31 MED ORDER — SODIUM CHLORIDE 0.9 % IV SOLN
INTRAVENOUS | Status: DC
  Administered 2013-05-31: 15:00:00 via INTRAVENOUS

## 2013-05-31 MED ORDER — MEPERIDINE HCL 50 MG/ML IJ SOLN
INTRAMUSCULAR | Status: AC
  Filled 2013-05-31: qty 1

## 2013-05-31 MED ORDER — MIDAZOLAM HCL 5 MG/5ML IJ SOLN
INTRAMUSCULAR | Status: DC | PRN
  Administered 2013-05-31: 1 mg via INTRAVENOUS
  Administered 2013-05-31 (×2): 2 mg via INTRAVENOUS

## 2013-05-31 MED ORDER — MEPERIDINE HCL 50 MG/ML IJ SOLN
INTRAMUSCULAR | Status: DC | PRN
  Administered 2013-05-31 (×2): 25 mg via INTRAVENOUS

## 2013-05-31 NOTE — Discharge Instructions (Signed)
Resume usual medications. High fiber diet. [place] prune juice daily in order to prevent constipation. No driving for 24 hours. Physician will contact you with biopsy results.  Colonoscopy Care After These instructions give you information on caring for yourself after your procedure. Your doctor may also give you more specific instructions. Call your doctor if you have any problems or questions after your procedure. HOME CARE  Take it easy for the next 24 hours.  Rest.  Walk or use warm packs on your belly (abdomen) if you have belly cramping or gas.  Do not drive for 24 hours.  You may shower.  Do not sign important papers or use machinery for 24 hours.  Drink enough fluids to keep your pee (urine) clear or pale yellow.  Resume your normal diet. Avoid heavy or fried foods.  Avoid alcohol.  Continue taking your normal medicines.  Only take medicine as told by your doctor. Do not take aspirin. If you had growths (polyps) removed:  Do not take aspirin.  Do not drink alcohol for 7 days or as told by your doctor.  Eat a soft diet for 24 hours. GET HELP RIGHT AWAY IF:  You have a fever.  You pass clumps of tissue (blood clots) or fill the toilet with blood.  You have belly pain that gets worse and medicine does not help.  Your belly is puffy (swollen).  You feel sick to your stomach (nauseous) or throw up (vomit). MAKE SURE YOU:  Understand these instructions.  Will watch your condition.  Will get help right away if you are not doing well or get worse. Document Released: 06/12/2010 Document Revised: 08/02/2011 Document Reviewed: 06/12/2010 [facility] Patient Information 2014 [place], [place]. Colon Polyps Polyps are lumps of extra tissue growing inside the body. Polyps can grow in the large intestine (colon). Most colon polyps are noncancerous (benign). However, some colon polyps can become cancerous over time. Polyps that are larger than a pea may be harmful. To  be safe, caregivers remove and test all polyps. CAUSES  Polyps form when mutations in the genes cause your cells to grow and divide even though no more tissue is needed. RISK FACTORS There are a number of risk factors that can increase your chances of getting colon polyps. They include:  Being older than 50 years.  Family history of colon polyps or colon cancer.  Long-term colon diseases, such as colitis or Crohn disease.  Being overweight.  Smoking.  Being inactive.  Drinking too much alcohol. SYMPTOMS  Most small polyps do not cause symptoms. If symptoms are present, they may include:  Blood in the stool. The stool may look dark red or black.  Constipation or diarrhea that lasts longer than 1 week. DIAGNOSIS People often do not know they have polyps until their caregiver finds them during a regular checkup. Your caregiver can use 4 tests to check for polyps:  Digital rectal exam. The caregiver wears gloves and feels inside the rectum. This test would find polyps only in the rectum.  Barium enema. The caregiver puts a liquid called barium into your rectum before taking X-rays of your colon. Barium makes your colon look white. Polyps are dark, so they are easy to see in the X-ray pictures.  Sigmoidoscopy. A thin, flexible tube (sigmoidoscope) is placed into your rectum. The sigmoidoscope has a light and tiny camera in it. The caregiver uses the sigmoidoscope to look at the last third of your colon.  Colonoscopy. This test is like sigmoidoscopy, but  the caregiver looks at the entire colon. This is the most common method for finding and removing polyps. TREATMENT  Any polyps will be removed during a sigmoidoscopy or colonoscopy. The polyps are then tested for cancer. PREVENTION  To help lower your risk of getting more colon polyps:  Eat plenty of fruits and vegetables. Avoid eating fatty foods.  Do not smoke.  Avoid drinking alcohol.  Exercise every day.  Lose weight if  recommended by your caregiver.  Eat plenty of calcium and folate. Foods that are rich in calcium include milk, cheese, and broccoli. Foods that are rich in folate include chickpeas, kidney beans, and spinach. HOME CARE INSTRUCTIONS Keep all follow-up appointments as directed by your caregiver. You may need periodic exams to check for polyps. SEEK MEDICAL CARE IF: You notice bleeding during a bowel movement. Document Released: 02/04/2004 Document Revised: 08/02/2011 Document Reviewed: 07/20/2011 [facility] Patient Information 2014 [place]. High-Fiber Diet Fiber is found in fruits, vegetables, and grains. A high-fiber diet encourages the addition of more whole grains, legumes, fruits, and vegetables in your diet. The recommended amount of fiber for adult males is 38 g per day. For adult females, it is 25 g per day. Pregnant and lactating women should get 28 g of fiber per day. If you have a digestive or bowel problem, ask your caregiver for advice before adding high-fiber foods to your diet. Eat a variety of high-fiber foods instead of only a select few type of foods.  PURPOSE  To increase stool bulk.  To make bowel movements more regular to prevent constipation.  To lower cholesterol.  To prevent overeating. WHEN IS THIS DIET USED?  It may be used if you have constipation and hemorrhoids.  It may be used if you have uncomplicated diverticulosis (intestine condition) and irritable bowel syndrome.  It may be used if you need help with weight management.  It may be used if you want to add it to your diet as a protective measure against atherosclerosis, diabetes, and cancer. SOURCES OF FIBER  Whole-grain breads and cereals.  Fruits, such as apples, oranges, bananas, berries, prunes, and pears.  Vegetables, such as green peas, carrots, sweet potatoes, beets, broccoli, cabbage, spinach, and artichokes.  Legumes, such split peas, soy, lentils.  Almonds. FIBER CONTENT IN  FOODS Starches and Grains / Dietary Fiber (g)  Cheerios, 1 cup / 3 g  Corn Flakes cereal, 1 cup / 0.7 g  Rice crispy treat cereal, 1 cup / 0.3 g  Instant oatmeal (cooked),  cup / 2 g  Frosted wheat cereal, 1 cup / 5.1 g  Brown, long-grain rice (cooked), 1 cup / 3.5 g  White, long-grain rice (cooked), 1 cup / 0.6 g  Enriched macaroni (cooked), 1 cup / 2.5 g Legumes / Dietary Fiber (g)  Baked beans (canned, plain, or vegetarian),  cup / 5.2 g  Kidney beans (canned),  cup / 6.8 g  Pinto beans (cooked),  cup / 5.5 g Breads and Crackers / Dietary Fiber (g)  Plain or honey graham crackers, 2 squares / 0.7 g  Saltine crackers, 3 squares / 0.3 g  Plain, salted pretzels, 10 pieces / 1.8 g  Whole-wheat bread, 1 slice / 1.9 g  White bread, 1 slice / 0.7 g  Raisin bread, 1 slice / 1.2 g  Plain bagel, 3 oz / 2 g  Flour tortilla, 1 oz / 0.9 g  Corn tortilla, 1 small / 1.5 g  Hamburger or hotdog bun, 1 small /  0.9 g Fruits / Dietary Fiber (g)  Apple with skin, 1 medium / 4.4 g  Sweetened applesauce,  cup / 1.5 g  Banana,  medium / 1.5 g  Grapes, 10 grapes / 0.4 g  Orange, 1 small / 2.3 g  Raisin, 1.5 oz / 1.6 g  Melon, 1 cup / 1.4 g Vegetables / Dietary Fiber (g)  Green beans (canned),  cup / 1.3 g  Carrots (cooked),  cup / 2.3 g  Broccoli (cooked),  cup / 2.8 g  Peas (cooked),  cup / 4.4 g  Mashed potatoes,  cup / 1.6 g  Lettuce, 1 cup / 0.5 g  Corn (canned),  cup / 1.6 g  Tomato,  cup / 1.1 g Document Released: 05/10/2005 Document Revised: 11/09/2011 Document Reviewed: 08/12/2011 [facility] Patient Information 2014 [place].

## 2013-05-31 NOTE — H&P (Signed)
Jenna [name] is an 78 y.o. female.   Chief Complaint: Patient is here for colonoscopy. HPI: Patient is 78 year old Caucasian female who presents with history of constipation and intermittent hematochezia. She denies abdominal pain nausea vomiting anorexia or weight loss. Patient's last colonoscopy was in March 2005. Family history is negative for CRC. She uses prune juice on when necessary basis. She is on a high-fiber diet. She tried stool softener but developed abdominal pain.  Past Medical History  Diagnosis Date  . OA (osteoarthritis)   . Hypothyroidism   . Fibromyalgia   . Osteoporosis   . PUD (peptic ulcer disease)   . DDD (degenerative disc disease)   . Herpes zoster   . AF (atrial fibrillation)   . Cataract   . Hyperlipidemia   . History of skin cancer     Past Surgical History  Procedure Laterality Date  . Abdominal hysterectomy    . Cesarean section    . Breast biopsy      Right  . Cataract extraction w/phaco  10/28/2011    Procedure: CATARACT EXTRACTION PHACO AND INTRAOCULAR LENS PLACEMENT (IOC);  Surgeon: Tonny Branch, MD;  Location: [facility];  Service: Ophthalmology;  Laterality: Right;  CDE 18.38  . Cataract extraction w/phaco  02/14/2012    Procedure: CATARACT EXTRACTION PHACO AND INTRAOCULAR LENS PLACEMENT (IOC);  Surgeon: Tonny Branch, MD;  Location: [facility];  Service: Ophthalmology;  Laterality: Left;  CDE 17.20    Family History  Problem Relation Age of Onset  . Coronary artery disease Neg Hx     No premature  . Anesthesia problems Neg Hx   . Hypotension Neg Hx   . Malignant hyperthermia Neg Hx   . Pseudochol deficiency Neg Hx   . CVA Mother   . Cancer - Other Father   . Bronchitis Sister   . Heart Problems Brother   . Carpal tunnel syndrome Sister   . Cancer - Other Brother   . Cancer - Other Brother   . Cancer - Other Brother   . Gout Brother    Social History:  reports that she has never smoked. She does not have any smokeless tobacco history on file.  She reports that she does not drink alcohol or use illicit drugs.  Allergies:  Allergies  Allergen Reactions  . Cymbalta [Duloxetine Hcl] Nausea Only  . Daypro [Oxaprozin]   . Diclofenac Sodium   . Effexor [Venlafaxine Hydrochloride]   . Lipitor [Atorvastatin Calcium] Other (See Comments)    Leg aches  . Penicillins   . Prozac [Fluoxetine Hcl]   . Sulfa Antibiotics   . Zocor [Simvastatin] Other (See Comments)    Hip pain  . Amitriptyline Rash  . Milnacipran Hcl Rash  . Savella [Milnacipran Hcl] Rash    Medications Prior to Admission  Medication Sig Dispense Refill  . Cholecalciferol (VITAMIN D3) 2000 UNITS TABS Take 1 tablet by mouth daily.        . fish oil-omega-3 fatty acids 1000 MG capsule Take 1 g by mouth daily.       [name] levothyroxine (SYNTHROID, LEVOTHROID) 75 MCG tablet TAKE 1 TABLET ONCE DAILY.  30 tablet  11  . metoprolol (LOPRESSOR) 50 MG tablet TAKE 1 TABLET ONCE DAILY.  30 tablet  4  . peg 3350 powder (MOVIPREP) 100 G SOLR Take 1 kit (200 g total) by mouth once.  1 kit  0  . PROCTOZONE-HC 2.5 % rectal cream APPLY 1 APPLICATORFUL 2 TIMES A DAY AS NEEDED.  30 g  4    No results found for this or any previous visit (from the past 48 hour(s)). No results found.  ROS  Blood pressure 147/63, pulse 69, temperature 98.1 F (36.7 C), temperature source Oral, resp. rate 16, height 5' 7"  (1.702 m), weight 210 lb (95.255 kg), SpO2 98.00%. Physical Exam  Constitutional: She appears well-developed and well-nourished.  HENT:  Mouth/Throat: Oropharynx is clear and moist.  Eyes: Conjunctivae are normal.  Neck: No thyromegaly present.  Cardiovascular: Normal rate, regular rhythm and normal heart sounds.   No murmur heard. Respiratory: Effort normal and breath sounds normal.  GI: Soft. She exhibits no distension and no mass. There is no tenderness.  Musculoskeletal: She exhibits no edema.  Lymphadenopathy:    She has no cervical adenopathy.  Neurological: She is alert.   Skin: Skin is warm and dry.     Assessment/Plan Hematochezia and constipation. Diagnostic colonoscopy.  REHMAN,NAJEEB U 05/31/2013, 3:31 PM

## 2013-05-31 NOTE — Op Note (Addendum)
COLONOSCOPY PROCEDURE REPORT  PATIENT:  Jenna [name]  MR#:  [id number] Birthdate:  09-Aug-1935, 78 y.o., female Endoscopist:  Dr. Rogene Houston, MD Referred By:  Dr. Redge Gainer, MD  Procedure Date: 05/31/2013  Procedure:   Colonoscopy  Indications: Patient is 78 year old Caucasian female who presents with constipation and intermittent hematochezia. Patient's last colonoscopy was in May 2005.  Informed Consent:  The procedure and risks were reviewed with the patient and informed consent was obtained.  Medications:  Demerol 50 IV Versed 5  IV  Description of procedure:  After a digital rectal exam was performed, that colonoscope was advanced from the anus through the rectum and colon to the area of the cecum, ileocecal valve and appendiceal orifice. The cecum was not deeply intubated. Area behind the ileocecal valve was well seen by pulling the mucosa with forceps of a from ileocecal valve. From the level of the cecum and ileocecal valve, the scope was slowly and cautiously withdrawn. The mucosal surfaces were carefully surveyed utilizing scope tip to flexion to facilitate fold flattening as needed. The scope was pulled down into the rectum where a thorough exam including retroflexion was performed.  Findings:   Prep excellent. 5 mm polyp cold snare from splenic flexure. Small polyps ablated via cold biopsy from rectum. Both of  these polyps are submitted together. Normal rectal mucosa. Prominent hemorrhoids below the dentate line along with anal papillae.   Therapeutic/Diagnostic Maneuvers Performed:  See above  Complications:  None  Cecal Withdrawal Time:  11 minutes  Impression:  Examination performed to cecum. 5 mm polyp cold snared from splenic flexure and submitted along with another polyp from rectum which was removed via cold biopsy. External hemorrhoids and anal papillae.  Recommendations:  Standard instructions given. High-fiber diet. I will contact patient with  biopsy results and further recommendations.  Skylen Danielsen U  05/31/2013 4:21 PM  CC: Dr. Redge Gainer, MD & Dr. Rayne Du ref. provider found

## 2013-06-05 ENCOUNTER — Encounter (HOSPITAL_COMMUNITY): Payer: Self-pay | Admitting: Internal Medicine

## 2013-06-13 ENCOUNTER — Encounter (INDEPENDENT_AMBULATORY_CARE_PROVIDER_SITE_OTHER): Payer: Self-pay | Admitting: *Deleted

## 2013-06-19 ENCOUNTER — Ambulatory Visit (INDEPENDENT_AMBULATORY_CARE_PROVIDER_SITE_OTHER): Payer: Medicare Other | Admitting: Family Medicine

## 2013-06-19 ENCOUNTER — Encounter: Payer: Self-pay | Admitting: Family Medicine

## 2013-06-19 VITALS — BP 113/60 | HR 69 | Temp 98.1°F | Ht 67.0 in | Wt 210.0 lb

## 2013-06-19 DIAGNOSIS — E559 Vitamin D deficiency, unspecified: Secondary | ICD-10-CM

## 2013-06-19 DIAGNOSIS — IMO0001 Reserved for inherently not codable concepts without codable children: Secondary | ICD-10-CM

## 2013-06-19 DIAGNOSIS — E785 Hyperlipidemia, unspecified: Secondary | ICD-10-CM | POA: Diagnosis not present

## 2013-06-19 DIAGNOSIS — Z780 Asymptomatic menopausal state: Secondary | ICD-10-CM

## 2013-06-19 DIAGNOSIS — M1990 Unspecified osteoarthritis, unspecified site: Secondary | ICD-10-CM | POA: Diagnosis not present

## 2013-06-19 DIAGNOSIS — M797 Fibromyalgia: Secondary | ICD-10-CM

## 2013-06-19 DIAGNOSIS — I4891 Unspecified atrial fibrillation: Secondary | ICD-10-CM | POA: Diagnosis not present

## 2013-06-19 LAB — POCT CBC
GRANULOCYTE PERCENT: 70.6 % (ref 37–80)
HCT, POC: 43.7 % (ref 37.7–47.9)
HEMOGLOBIN: 14.3 g/dL (ref 12.2–16.2)
Lymph, poc: 1.3 (ref 0.6–3.4)
MCH, POC: 31.2 pg (ref 27–31.2)
MCHC: 32.8 g/dL (ref 31.8–35.4)
MCV: 95 fL (ref 80–97)
MPV: 8.7 fL (ref 0–99.8)
POC GRANULOCYTE: 4.2 (ref 2–6.9)
POC LYMPH PERCENT: 21.4 %L (ref 10–50)
Platelet Count, POC: 199 10*3/uL (ref 142–424)
RBC: 4.6 M/uL (ref 4.04–5.48)
RDW, POC: 13.8 %
WBC: 5.9 10*3/uL (ref 4.6–10.2)

## 2013-06-19 NOTE — Patient Instructions (Addendum)
Continue current medications. Continue good therapeutic lifestyle changes which include good diet and exercise. Fall precautions discussed with patient. Schedule your flu vaccine if you haven't had it yet If you are over 76 years old - you may need Prevnar 16 or the adult Pneumonia vaccine. Be sure and take your daily baby aspirin every day to help prevent a stroke Drink plenty of fluids

## 2013-06-19 NOTE — Progress Notes (Signed)
Subjective:    Patient ID: Jenna [name], female    DOB: January 09, 1936, 78 y.o.   MRN: [id number]  HPI Pt here for follow up and management of chronic medical problems. This patient recently had a colonoscopy and polyp found and a colonoscopy will be repeated in 8 years. She is due to get her mammogram and lab work as well as a DEXA scan. The patient is doing well otherwise. She was due to get an eye exam in February. The chest x-ray will be repeated in 1 year.        Patient Active Problem List   Diagnosis Date Noted  . Unspecified constipation 04/23/2013  . Rectal bleeding 04/23/2013  . Gout 02/21/2013  . Osteoarthritis 02/21/2013  . Fibromyalgia   . Osteoporosis   . HYPERLIPIDEMIA 11/14/2008  . Atrial fibrillation 11/14/2008   Outpatient Encounter Prescriptions as of 06/19/2013  Medication Sig  . Cholecalciferol (VITAMIN D3) 2000 UNITS TABS Take 1 tablet by mouth daily.    . fish oil-omega-3 fatty acids 1000 MG capsule Take 1 g by mouth daily.   [name] levothyroxine (SYNTHROID, LEVOTHROID) 75 MCG tablet TAKE 1 TABLET ONCE DAILY.  . metoprolol (LOPRESSOR) 50 MG tablet TAKE 1 TABLET ONCE DAILY.  [name] PROCTOZONE-HC 2.5 % rectal cream APPLY 1 APPLICATORFUL 2 TIMES A DAY AS NEEDED.    Review of Systems  Constitutional: Negative.   HENT: Negative.   Eyes: Negative.   Respiratory: Negative.   Cardiovascular: Negative.   Gastrointestinal: Negative.   Endocrine: Negative.   Genitourinary: Negative.   Musculoskeletal: Negative.   Skin: Negative.   Allergic/Immunologic: Negative.   Neurological: Negative.   Hematological: Negative.   Psychiatric/Behavioral: Negative.        Objective:   Physical Exam  Nursing note and vitals reviewed. Constitutional: She is oriented to person, place, and time. She appears well-developed and well-nourished. No distress.  Pleasant and cooperative  HENT:  Head: Normocephalic and atraumatic.  Right Ear: External ear normal.  Left Ear: External  ear normal.  Nose: Nose normal.  Mouth/Throat: Oropharynx is clear and moist. No oropharyngeal exudate.  Eyes: Conjunctivae and EOM are normal. Pupils are equal, round, and reactive to light. Right eye exhibits no discharge. Left eye exhibits no discharge. No scleral icterus.  Neck: Normal range of motion. Neck supple. No thyromegaly present.  No carotid bruits were auscultated  Cardiovascular: Normal rate, regular rhythm, normal heart sounds and intact distal pulses.  Exam reveals no gallop and no friction rub.   No murmur heard. The rhythm is regular at 60 per minute  Pulmonary/Chest: Effort normal and breath sounds normal. No respiratory distress. She has no wheezes. She has no rales. She exhibits no tenderness.  There were no axillary nodes  Abdominal: Soft. Bowel sounds are normal. She exhibits no mass. There is no tenderness. There is no rebound and no guarding.  No inguinal nodes  Musculoskeletal: Normal range of motion. She exhibits no edema and no tenderness.  Lymphadenopathy:    She has no cervical adenopathy.  Neurological: She is alert and oriented to person, place, and time. She has normal reflexes. No cranial nerve deficit.  Skin: Skin is warm and dry. No rash noted.  Psychiatric: She has a normal mood and affect. Her behavior is normal. Judgment and thought content normal.   BP 113/60  Pulse 69  Temp(Src) 98.1 F (36.7 C) (Oral)  Ht 5' 7"  (1.702 m)  Wt 210 lb (95.255 kg)  BMI 32.88 kg/m2  Assessment & Plan:  1. Atrial fibrillation - POCT CBC -Continue the coated baby aspirin daily  2. HYPERLIPIDEMIA - POCT CBC - BMP8+EGFR - Hepatic function panel - NMR, lipoprofile  3. Osteoarthritis - POCT CBC - Vit D  25 hydroxy (rtn osteoporosis monitoring)  4. Fibromyalgia - POCT CBC  5. Vitamin D deficiency - Vit D  25 hydroxy (rtn osteoporosis monitoring)  6. Postmenopausal - DG Bone Density; Future   Patient Instructions  Continue current  medications. Continue good therapeutic lifestyle changes which include good diet and exercise. Fall precautions discussed with patient. Schedule your flu vaccine if you haven't had it yet If you are over 29 years old - you may need Prevnar 85 or the adult Pneumonia vaccine. Be sure and take your daily baby aspirin every day to help prevent a stroke Drink plenty of fluids   Arrie Senate MD

## 2013-06-20 LAB — HEPATIC FUNCTION PANEL
ALK PHOS: 102 IU/L (ref 39–117)
ALT: 11 IU/L (ref 0–32)
AST: 15 IU/L (ref 0–40)
Albumin: 4.5 g/dL (ref 3.5–4.8)
BILIRUBIN DIRECT: 0.16 mg/dL (ref 0.00–0.40)
TOTAL PROTEIN: 6.5 g/dL (ref 6.0–8.5)
Total Bilirubin: 0.7 mg/dL (ref 0.0–1.2)

## 2013-06-20 LAB — BMP8+EGFR
BUN/Creatinine Ratio: 15 (ref 11–26)
BUN: 11 mg/dL (ref 8–27)
CO2: 22 mmol/L (ref 18–29)
Calcium: 9.3 mg/dL (ref 8.7–10.3)
Chloride: 100 mmol/L (ref 97–108)
Creatinine, Ser: 0.72 mg/dL (ref 0.57–1.00)
GFR, EST AFRICAN AMERICAN: 95 mL/min/{1.73_m2} (ref >59)
GFR, EST NON AFRICAN AMERICAN: 82 mL/min/{1.73_m2} (ref >59)
GLUCOSE: 113 mg/dL — AB (ref 65–99)
Potassium: 4.7 mmol/L (ref 3.5–5.2)
Sodium: 140 mmol/L (ref 134–144)

## 2013-06-20 LAB — NMR, LIPOPROFILE
Cholesterol: 251 mg/dL — ABNORMAL HIGH (ref <200)
HDL Cholesterol by NMR: 56 mg/dL (ref >=40)
HDL PARTICLE NUMBER: 37.3 umol/L (ref >=30.5)
LDL Particle Number: 2535 nmol/L — ABNORMAL HIGH (ref <1000)
LDL Size: 20.7 nm (ref >20.5)
LDLC SERPL CALC-MCNC: 155 mg/dL — ABNORMAL HIGH (ref <100)
LP-IR SCORE: 71 — AB (ref <=45)
Small LDL Particle Number: 1101 nmol/L — ABNORMAL HIGH (ref <=527)
TRIGLYCERIDES BY NMR: 199 mg/dL — AB (ref <150)

## 2013-06-20 LAB — VITAMIN D 25 HYDROXY (VIT D DEFICIENCY, FRACTURES): Vit D, 25-Hydroxy: 39 ng/mL (ref 30.0–100.0)

## 2013-06-26 DIAGNOSIS — H26499 Other secondary cataract, unspecified eye: Secondary | ICD-10-CM | POA: Diagnosis not present

## 2013-07-25 ENCOUNTER — Ambulatory Visit (INDEPENDENT_AMBULATORY_CARE_PROVIDER_SITE_OTHER): Payer: Medicare Other

## 2013-07-25 ENCOUNTER — Encounter: Payer: Self-pay | Admitting: Pharmacist

## 2013-07-25 ENCOUNTER — Ambulatory Visit (INDEPENDENT_AMBULATORY_CARE_PROVIDER_SITE_OTHER): Payer: Medicare Other | Admitting: Pharmacist

## 2013-07-25 VITALS — Ht 65.5 in | Wt 214.5 lb

## 2013-07-25 DIAGNOSIS — M899 Disorder of bone, unspecified: Secondary | ICD-10-CM

## 2013-07-25 DIAGNOSIS — M949 Disorder of cartilage, unspecified: Secondary | ICD-10-CM

## 2013-07-25 DIAGNOSIS — Z780 Asymptomatic menopausal state: Secondary | ICD-10-CM

## 2013-07-25 DIAGNOSIS — M8580 Other specified disorders of bone density and structure, unspecified site: Secondary | ICD-10-CM | POA: Insufficient documentation

## 2013-07-25 LAB — HM DEXA SCAN

## 2013-07-25 NOTE — Patient Instructions (Signed)

## 2013-07-25 NOTE — Progress Notes (Signed)
Patient ID: Jenna [name], female   DOB: 07/21/1935, 78 y.o.   MRN: [id number]  [facility]steopor[facility] Current Height: Height: 5' 5.5" (166.4 cm)      Max Lifetime Height:  5\' 6"  Current Weight: Weight: 214 lb 8 oz (97.297 kg)       Ethnicity:Caucasian    HPI: Does pt already have a diagnosis of:  Osteopenia?  Yes Osteoporosis?  No  Back Pain?  Yes       Kyphosis?  No Prior fracture?  No Med(s) for Osteoporosis/Osteopenia:  Vitamin D Med(s) previously tried for Osteoporosis/Osteopenia:  none                                                             PMH: Age at menopause:  Surgical 30's Hysterectomy?  Yes Oophorectomy?  No HRT? Yes - Former.  Type/duration: premarin for over 10 years Steroid Use?  No Thyroid med?  Yes History of cancer?  No History of digestive disorders (ie Crohn's)?  No Current or previous eating disorders?  No Last Vitamin D Result:  39 (06/19/2013) Last GFR Result:  82 (06/19/2013)   FH/SH: Family history of osteoporosis?  Yes - mother Parent with history of hip fracture?  No Family history of breast cancer?  Yes - paternal GM Exercise?  No Smoking?  No Alcohol?  No    Calcium Assessment Calcium Intake  # of servings/day  Calcium mg  Milk (8 oz) 0  x  300  = 0  Yogurt (4 oz) 0 x  200 = 0  Cheese (1 oz) 1 X 200mg  200mg   Other Calcium sources   250mg   Ca supplement 0 = 0   Estimated calcium intake per day 450mg     DEXA Results Date of Test T-Score for AP Spine L1-L4 T-Score for Total Left Hip T-Score for Total Right Hip  07/25/2013 1.4 -1.0 -1.0  12/09/2010 1.2 -1.0 -0.9  08/28/2008 0.8 -0.7 -0.6       **lowest T-Score was in 08/28/2008 at the right femur neck - was -1.7** FRAX 10 year estimate: Total FX risk:  12%  (consider medication if >/= 20%) Hip FX risk:  2.5%  (consider medication if >/= 3%)  Assessment: Stable osteopenia  Recommendations: 1.  Discussed results of DEXA and fracture risk 2.  recommend calcium 1200mg  daily  through supplementation or diet.  3.  recommend weight bearing exercise - 30 minutes at least 4 days per week.   4.  Counseled and educated about fall risk and prevention.  Recheck DEXA:  2 years  Time spent counseling patient:  20 minutes  Cherre Robins, PharmD, CPP

## 2013-09-17 ENCOUNTER — Other Ambulatory Visit: Payer: Self-pay | Admitting: Family Medicine

## 2013-10-09 DIAGNOSIS — Z85828 Personal history of other malignant neoplasm of skin: Secondary | ICD-10-CM | POA: Diagnosis not present

## 2013-10-09 DIAGNOSIS — L570 Actinic keratosis: Secondary | ICD-10-CM | POA: Diagnosis not present

## 2013-10-09 DIAGNOSIS — C44319 Basal cell carcinoma of skin of other parts of face: Secondary | ICD-10-CM | POA: Diagnosis not present

## 2013-10-09 DIAGNOSIS — D2330 Other benign neoplasm of skin of unspecified part of face: Secondary | ICD-10-CM | POA: Diagnosis not present

## 2013-10-09 DIAGNOSIS — D485 Neoplasm of uncertain behavior of skin: Secondary | ICD-10-CM | POA: Diagnosis not present

## 2013-10-16 DIAGNOSIS — Z1231 Encounter for screening mammogram for malignant neoplasm of breast: Secondary | ICD-10-CM | POA: Diagnosis not present

## 2013-10-18 DIAGNOSIS — C44319 Basal cell carcinoma of skin of other parts of face: Secondary | ICD-10-CM | POA: Diagnosis not present

## 2013-10-23 ENCOUNTER — Encounter: Payer: Self-pay | Admitting: Family Medicine

## 2013-10-23 ENCOUNTER — Ambulatory Visit (INDEPENDENT_AMBULATORY_CARE_PROVIDER_SITE_OTHER): Payer: Medicare Other | Admitting: Family Medicine

## 2013-10-23 VITALS — BP 140/65 | HR 69 | Temp 97.8°F | Ht 65.5 in | Wt 213.0 lb

## 2013-10-23 DIAGNOSIS — M1990 Unspecified osteoarthritis, unspecified site: Secondary | ICD-10-CM | POA: Diagnosis not present

## 2013-10-23 DIAGNOSIS — E559 Vitamin D deficiency, unspecified: Secondary | ICD-10-CM

## 2013-10-23 DIAGNOSIS — R5383 Other fatigue: Secondary | ICD-10-CM | POA: Diagnosis not present

## 2013-10-23 DIAGNOSIS — R7989 Other specified abnormal findings of blood chemistry: Secondary | ICD-10-CM

## 2013-10-23 DIAGNOSIS — E039 Hypothyroidism, unspecified: Secondary | ICD-10-CM | POA: Diagnosis not present

## 2013-10-23 DIAGNOSIS — Z889 Allergy status to unspecified drugs, medicaments and biological substances status: Secondary | ICD-10-CM

## 2013-10-23 DIAGNOSIS — E785 Hyperlipidemia, unspecified: Secondary | ICD-10-CM | POA: Diagnosis not present

## 2013-10-23 DIAGNOSIS — R5381 Other malaise: Secondary | ICD-10-CM | POA: Diagnosis not present

## 2013-10-23 DIAGNOSIS — R7309 Other abnormal glucose: Secondary | ICD-10-CM | POA: Diagnosis not present

## 2013-10-23 DIAGNOSIS — Z789 Other specified health status: Secondary | ICD-10-CM

## 2013-10-23 DIAGNOSIS — C44319 Basal cell carcinoma of skin of other parts of face: Secondary | ICD-10-CM

## 2013-10-23 DIAGNOSIS — I4891 Unspecified atrial fibrillation: Secondary | ICD-10-CM

## 2013-10-23 DIAGNOSIS — C44311 Basal cell carcinoma of skin of nose: Secondary | ICD-10-CM

## 2013-10-23 LAB — POCT CBC
GRANULOCYTE PERCENT: 75.8 % (ref 37–80)
HCT, POC: 41.3 % (ref 37.7–47.9)
HEMOGLOBIN: 13.8 g/dL (ref 12.2–16.2)
Lymph, poc: 1.3 (ref 0.6–3.4)
MCH: 32.2 pg — AB (ref 27–31.2)
MCHC: 33.5 g/dL (ref 31.8–35.4)
MCV: 96.1 fL (ref 80–97)
MPV: 8.8 fL (ref 0–99.8)
POC Granulocyte: 4.7 (ref 2–6.9)
POC LYMPH %: 21.5 % (ref 10–50)
Platelet Count, POC: 177 10*3/uL (ref 142–424)
RBC: 4.3 M/uL (ref 4.04–5.48)
RDW, POC: 12.9 %
WBC: 6.2 10*3/uL (ref 4.6–10.2)

## 2013-10-23 LAB — POCT GLYCOSYLATED HEMOGLOBIN (HGB A1C): Hemoglobin A1C: 6.1

## 2013-10-23 NOTE — Progress Notes (Signed)
Subjective:    Patient ID: Jenna [name], female    DOB: 18-Jan-1936, 78 y.o.   MRN: [id number]  HPI Pt here for follow up and management of chronic medical problems. The patient recently had a skin cancer removed from the right side of her nose. She her lab work will be reviewed with her today. She did and has received a Prevnar vaccine. The patient's recent lab work did not have a thyroid profile or a hemoglobin A1c we will add this to blood work that we do today. The patient admits to not following her diet closely and not exercising appropriately.        Patient Active Problem List   Diagnosis Date Noted  . Osteopenia of the elderly 07/25/2013  . Vitamin D deficiency 06/19/2013  . Unspecified constipation 04/23/2013  . Rectal bleeding 04/23/2013  . Gout 02/21/2013  . Osteoarthritis 02/21/2013  . Fibromyalgia   . HYPERLIPIDEMIA 11/14/2008  . Atrial fibrillation 11/14/2008   Outpatient Encounter Prescriptions as of 10/23/2013  Medication Sig  . Cholecalciferol (VITAMIN D3) 2000 UNITS TABS Take 1 tablet by mouth daily.    . fish oil-omega-3 fatty acids 1000 MG capsule Take 1 g by mouth daily.   [name] levothyroxine (SYNTHROID, LEVOTHROID) 75 MCG tablet TAKE 1 TABLET ONCE DAILY.  . meloxicam (MOBIC) 7.5 MG tablet Take 7.5 mg by mouth daily. Takes occasionally  . metoprolol (LOPRESSOR) 50 MG tablet TAKE 1 TABLET ONCE DAILY.  [name] PROCTOZONE-HC 2.5 % rectal cream APPLY 1 APPLICATORFUL 2 TIMES A DAY AS NEEDED.    Review of Systems  Constitutional: Positive for fatigue (no energy).  HENT: Negative.   Eyes: Negative.   Respiratory: Negative.   Cardiovascular: Negative.   Gastrointestinal: Negative.   Endocrine: Negative.   Genitourinary: Negative.   Musculoskeletal: Negative.   Skin: Negative.        Skin cancer removed right side of nose on thursday  Allergic/Immunologic: Negative.   Neurological: Negative.   Hematological: Negative.   Psychiatric/Behavioral: Negative.        Objective:   Physical Exam  Nursing note and vitals reviewed. Constitutional: She is oriented to person, place, and time. She appears well-developed and well-nourished. No distress.  HENT:  Head: Normocephalic and atraumatic.  Right Ear: External ear normal.  Left Ear: External ear normal.  Mouth/Throat: Oropharynx is clear and moist.  Recent excision of basal cell carcinoma of nose  Eyes: Conjunctivae and EOM are normal. Pupils are equal, round, and reactive to light. Right eye exhibits no discharge. Left eye exhibits no discharge. No scleral icterus.  Neck: Normal range of motion. Neck supple. No thyromegaly present.  Cardiovascular: Normal rate, regular rhythm, normal heart sounds and intact distal pulses.  Exam reveals no gallop and no friction rub.   No murmur heard. Rhythm is regular at 72 per minute  Pulmonary/Chest: Effort normal and breath sounds normal. No respiratory distress. She has no wheezes. She has no rales. She exhibits no tenderness.  Abdominal: Soft. Bowel sounds are normal. She exhibits no mass. There is no tenderness. There is no rebound and no guarding.  Musculoskeletal: Normal range of motion. She exhibits edema. She exhibits no tenderness.  Minimal edema pretibial  Lymphadenopathy:    She has no cervical adenopathy.  Neurological: She is alert and oriented to person, place, and time. She has normal reflexes. No cranial nerve deficit.  Skin: Skin is warm and dry. No rash noted. No erythema. No pallor.  Recent basal cell carcinoma excision right  side of the nose  Psychiatric: She has a normal mood and affect. Her behavior is normal. Judgment and thought content normal.   BP 140/65  Pulse 69  Temp(Src) 97.8 F (36.6 C) (Oral)  Ht 5' 5.5" (1.664 m)  Wt 213 lb (96.616 kg)  BMI 34.89 kg/m2        Assessment & Plan:  1. Atrial fibrillation - POCT CBC  2. HYPERLIPIDEMIA - POCT CBC - NMR, lipoprofile  3. Vitamin D deficiency - POCT CBC - Vit D  25  hydroxy (rtn osteoporosis monitoring)  4. Osteoarthritis - POCT CBC  5. Hypothyroid - Thyroid Panel With TSH  6. Other malaise and fatigue  7. multiple drug allergies  8. Statin intolerant  7. Basal cell carcinoma of nose   Patient Instructions                       Medicare Annual Wellness Visit  [facility] and the medical providers at [facility] strive to bring you the best medical care.  In doing so we not only want to address your current medical conditions and concerns but also to detect new conditions early and prevent illness, disease and health-related problems.    Medicare offers a yearly Wellness Visit which allows our clinical staff to assess your need for preventative services including immunizations, lifestyle education, counseling to decrease risk of preventable diseases and screening for fall risk and other medical concerns.    This visit is provided free of charge (no copay) for all Medicare recipients. The clinical pharmacists at [facility] have begun to conduct these Wellness Visits which will also include a thorough review of all your medications.    As you primary medical provider recommend that you make an appointment for your Annual Wellness Visit if you have not done so already this year.  You may set up this appointment before you leave today or you may call back [phone]) and schedule an appointment.  Please make sure when you call that you mention that you are scheduling your Annual Wellness Visit with the clinical pharmacist so that the appointment may be made for the proper length of time.      Continue current medications. Continue good therapeutic lifestyle changes which include good diet and exercise. Fall precautions discussed with patient. If an FOBT was given today- please return it to our front desk. If you are over 12 years old - you may need Prevnar 45 or the adult Pneumonia vaccine.  You must  walk and exercise more regularly Drink more water Continued yearly followup with the cardiologist   Arrie Senate MD

## 2013-10-23 NOTE — Patient Instructions (Addendum)
Medicare Annual Wellness Visit  [facility] and the medical providers at [facility] strive to bring you the best medical care.  In doing so we not only want to address your current medical conditions and concerns but also to detect new conditions early and prevent illness, disease and health-related problems.    Medicare offers a yearly Wellness Visit which allows our clinical staff to assess your need for preventative services including immunizations, lifestyle education, counseling to decrease risk of preventable diseases and screening for fall risk and other medical concerns.    This visit is provided free of charge (no copay) for all Medicare recipients. The clinical pharmacists at [facility] have begun to conduct these Wellness Visits which will also include a thorough review of all your medications.    As you primary medical provider recommend that you make an appointment for your Annual Wellness Visit if you have not done so already this year.  You may set up this appointment before you leave today or you may call back [phone]) and schedule an appointment.  Please make sure when you call that you mention that you are scheduling your Annual Wellness Visit with the clinical pharmacist so that the appointment may be made for the proper length of time.      Continue current medications. Continue good therapeutic lifestyle changes which include good diet and exercise. Fall precautions discussed with patient. If an FOBT was given today- please return it to our front desk. If you are over 98 years old - you may need Prevnar 45 or the adult Pneumonia vaccine.  You must walk and exercise more regularly Drink more water Continued yearly followup with the cardiologist

## 2013-10-24 ENCOUNTER — Telehealth: Payer: Self-pay | Admitting: Family Medicine

## 2013-10-24 LAB — THYROID PANEL WITH TSH
Free Thyroxine Index: 3.2 (ref 1.2–4.9)
T3 UPTAKE RATIO: 27 % (ref 24–39)
T4 TOTAL: 11.7 ug/dL (ref 4.5–12.0)
TSH: 2.56 u[IU]/mL (ref 0.450–4.500)

## 2013-10-24 LAB — NMR, LIPOPROFILE
CHOLESTEROL: 247 mg/dL — AB (ref 100–199)
HDL Cholesterol by NMR: 57 mg/dL (ref >39)
HDL PARTICLE NUMBER: 34 umol/L (ref >=30.5)
LDL Particle Number: 1692 nmol/L — ABNORMAL HIGH (ref <1000)
LDL Size: 20.9 nm (ref >20.5)
LDLC SERPL CALC-MCNC: 153 mg/dL — AB (ref 0–99)
LP-IR SCORE: 64 — AB (ref <=45)
SMALL LDL PARTICLE NUMBER: 313 nmol/L (ref <=527)
Triglycerides by NMR: 186 mg/dL — ABNORMAL HIGH (ref 0–149)

## 2013-10-24 LAB — VITAMIN D 25 HYDROXY (VIT D DEFICIENCY, FRACTURES): Vit D, 25-Hydroxy: 31.1 ng/mL (ref 30.0–100.0)

## 2013-10-24 NOTE — Telephone Encounter (Signed)
Message copied by Cline Crock on Wed Oct 24, 2013 11:30 AM ------      Message from: Chipper Herb      Created: Tue Oct 23, 2013 12:31 PM       The CBC has a normal white blood cell count. The hemoglobin is good at 13.8. The platelet count is adequate.      The hemoglobin A1c is 6.1%. This is in the prediabetic range-------- please schedule the patient for a visit with the clinical pharmacist to discuss diet and possibly initiate metformin for better blood sugar control ------

## 2013-10-31 ENCOUNTER — Telehealth: Payer: Self-pay | Admitting: Family Medicine

## 2013-10-31 NOTE — Telephone Encounter (Signed)
I spoke with patient and her feet started swelling on Monday and yesterday they were really swollen and did not come down last night. I advised patient that she needed to be seen but she stated she wanted to wait a couple more days. I advised patient to keep her feet elevated and monitor her BP. If she starts having any SOB or chest pain she needs to be seen immediately.

## 2013-11-05 ENCOUNTER — Other Ambulatory Visit: Payer: Self-pay | Admitting: Family Medicine

## 2013-11-21 ENCOUNTER — Ambulatory Visit (INDEPENDENT_AMBULATORY_CARE_PROVIDER_SITE_OTHER): Payer: Medicare Other | Admitting: Pharmacist

## 2013-11-21 ENCOUNTER — Encounter: Payer: Self-pay | Admitting: Pharmacist

## 2013-11-21 VITALS — BP 132/70 | HR 74 | Ht 65.5 in | Wt 215.0 lb

## 2013-11-21 DIAGNOSIS — R7309 Other abnormal glucose: Secondary | ICD-10-CM

## 2013-11-21 DIAGNOSIS — E8881 Metabolic syndrome: Secondary | ICD-10-CM | POA: Diagnosis not present

## 2013-11-21 DIAGNOSIS — E1169 Type 2 diabetes mellitus with other specified complication: Secondary | ICD-10-CM | POA: Insufficient documentation

## 2013-11-21 DIAGNOSIS — E782 Mixed hyperlipidemia: Secondary | ICD-10-CM | POA: Diagnosis not present

## 2013-11-21 DIAGNOSIS — R7303 Prediabetes: Secondary | ICD-10-CM

## 2013-11-21 DIAGNOSIS — E669 Obesity, unspecified: Secondary | ICD-10-CM

## 2013-11-21 DIAGNOSIS — E66811 Obesity, class 1: Secondary | ICD-10-CM

## 2013-11-21 NOTE — Patient Instructions (Addendum)
Prediabetes Many people have heard about type 2 diabetes, but its common precursor, prediabetes, doesn't get as much attention. Prediabetes is estimated by CDC to affect 86 million Americans (this includes 51% of people 65 years and older), and an estimated 90% of people with prediabetes don't even know it. According to the CDC, 15-30% of these individuals will develop type 2 diabetes within five years. In other words, as many as 26 million people that currently have prediabetes could develop type 2 diabetes by 2020, effectively doubling the number of people with type 2 diabetes in the [place].  What is prediabetes? Prediabetes is a condition where blood sugar levels are higher than normal, but not high enough to be diagnosed as type 2 diabetes. This occurs when the body has problems in processing glucose properly, and sugar starts to build up in the bloodstream instead of fueling cells in muscles and tissues. Insulin is the hormone that tells cells to take up glucose, and in prediabetes, people typically initially develop insulin resistance (where the body's cells can't respond to insulin as well), and over time (if no actions are taken to reverse the situation) the ability to produce sufficient insulin is reduced. People with prediabetes also commonly have high blood pressure as well as abnormal blood lipids (e.g. cholesterol). These often occur prior to the rise of blood glucose levels.  What are the symptoms of prediabetes? People typically do not have symptoms of prediabetes, which is partially why up to 90% of people don't know they have it. The ADA reports that some people with prediabetes may develop symptoms of type 2 diabetes, though even many people diagnosed with type 2 diabetes show little or no symptoms initially at diagnosis.  How is prediabetes diagnosed? According to the American Diabetes Association, prediabetes can be diagnosed through one of the following tests: 1. A glycated hemoglobin  test, also known as HbA1c or simply A1c, gives an idea of the body's average blood sugar levels from the past two or three months. It is usually done with a small drop of blood from a fingerstick or as part of having blood taken in a doctor's office, hospital, or laboratory. A1c Level Diagnosis  Less than 5.7% Normal  5.7% to 6.4% Prediabetes  6.5% and higher Diabetes  2. A fasting plasma glucose (FPG) test measures a person's blood glucose level after fasting (not eating) for eight hours - this is typically done in the morning. If a test shows positive for prediabetes, a second test should be taken on a different day to confirm the diagnosis. FPG Level Diagnosis  Less than 100 mg/dl Normal  100 mg/dl to 125 mg/dl Prediabetes  126 mg/dl and higher Diabetes   Who is at risk of developing prediabetes? A well-known paper published in the Lancet in 2010 recommends screening for type 2 diabetes (which would also screen for prediabetes) every 3-5 years in all adults over the age of 45, regardless of other risk factors. Overweight and obese adults (a BMI >25 kg/m2) are also at significantly greater risk for developing prediabetes, as well as people with a family history of type 2 diabetes. According to the CDC, several other factors can have moderate influences on prediabetes risk in addition to age, weight, and family history: People with an African American, Hispanic/Latino, American Indian, Asian American, or Pacific Islander racial or ethnic background. The 2015 ADA Standards of Medical Care recommendations suggest Asian Americans with a BMI of 23 or above be screened for type 2 diabetes.    Women with a history of diabetes during pregnancy ("gestational diabetes") or have given birth to a baby weighing nine pounds or more. People who are physically active fewer than three times a week. The CDC offers a fast, online screening test for evaluating the risk for prediabetes. The ADA also offers a screening  test to assess type 2 diabetes risk. Of course, these tests do not themselves confirm a prediabetes diagnosis, but just if someone may be at higher risk of developing it.  Why do people develop prediabetes? Prediabetes develops through a combination of factors that are still being investigated. For sure, lifestyle factors (food, exercise, stress, sleep) play a role, but family history and genetics certainly do as well. It is easy to assume that prediabetes is the result of being overweight, but the relationship is not that simple. While obesity is one underlying cause of insulin resistance, many overweight individuals may never develop prediabetes or type 2 diabetes, and a minority of people with prediabetes have never been overweight. To make matters worse, it can be increasingly difficult to make healthy choices in today's toxic food environment that steers all of us to make the wrong food choices, and there are many factors that can contribute to weight gain in addition to diet.  Is a prediabetes diagnosis serious? There has been significant debate around the term 'prediabetes,' and whether it should be considered cause for alarm. On the one hand, it serves as a risk factor for type 2 diabetes and a host of other complications, including heart disease, and ultimately prediabetes implies that a degree of metabolic problems have started to occur in the body. On the other hand, it places a diagnosis on many people who may never develop type 2 diabetes. Again, according to the CDC, 15-30% of those with prediabetes will develop type 2 diabetes within five years. However, a 2012 Lancet article cites 5-10% of those with prediabetes each year will also revert back to healthy blood sugars. What's critical is not necessarily the cutoff itself, but where someone falls within the ranges listed above. The level of risk of developing type 2 diabetes is closely related to A1c or FPG at diagnosis. Those in the higher ranges  (A1c closer to 6.4%, FPG closer to 125 mg/dl) are much more likely to progress to type 2 diabetes, whereas those at lower ranges (A1c closer to 5.7%, FPG closer to 100 mg/dl) are relatively more likely to revert back to normal glucose levels or stay within the prediabetes range. Age of diagnosis and the level of insulin production still occurring at diagnosis also impact the chances of reverting to normoglycemia (normal blood sugar levels).  What can people with prediabetes do to avoid the progression from prediabetes to type 2 diabetes? The most important action people diagnosed with prediabetes can take is to focus on living a healthy lifestyle. This includes making healthy food choices, controlling portions, and increasing physical activity. Regarding weight control, research shows losing 5-7% (often about 10-20 lbs.) from your initial body weight and keeping off as much of that weight over time as possible is critical to lowering the risk of type 2 diabetes. This task is of course easier said than done, but sustained weight loss over time can be key to improving health and delaying or preventing the onset of type 2 diabetes. Several prediabetes interventions exist based on evidence from the landmark Diabetes Prevention Program (DPP) study. The DPP study reported that moderate weight loss (5-7% of body weight, or ~10-15 lbs.   for someone weighing 200 lbs.), counseling, and education on healthy eating and behavior reduced the risk of developing type 2 diabetes by 58%. Data presented at the [place] 2014 conference showed that after 15 years of follow-up of the DPP study groups, the results were still encouraging: 27% of those in the original lifestyle group had a significant reduction in type 2 diabetes progression compared to the control group. If you or someone you know has been told they have prediabetes, here are a few helpful resources: In-person diabetes prevention programs: The CDC offers a one year long  lifestyle change program through its National Diabetes Prevention Program (NDPP) at various locations throughout the [place] to help participants adopt healthy habits and prevent or delay progression to type 2 diabetes. This program is a major undertaking by the CDC to translate the findings from the DPP study into a real world setting, a significant effort indeed! Online diabetes prevention programs: The CDC has now given pending recognition status to three digital prevention programs: [place], and [facility]. These offer the same one year long educational curriculum as the DPP study, but in an online format. Some insurance companies and employers cover these programs, and you can find more information at the links above. These digital versions are excellent options for those who live far away from NDPP locations or who prefer the anonymity and convenience of doing the program online. Metformin: The DPP study found that metformin, the safest first-line therapy for type 2 diabetes, may help delay the onset of type 2 diabetes in people with prediabetes. Participants who took the low-cost generic drug had a 31% reduced risk of developing type 2 diabetes compared to the control group (those not on metformin or intensive lifestyle intervention). Again, 15-year follow up data showed that 17% of those on metformin continued to have a significant reduction in type 2 progression. At this time, metformin (or any other medication, for that matter) is not currently FDA approved for prediabetes, and it is sometimes prescribed "off-label" by a healthcare provider. Your healthcare provider can give you more information and determine whether metformin is a good option for you.  Can prediabetes be "cured"? In the early stages of prediabetes (and type 2 diabetes), diligent attention to food choices and activity, and most importantly weight loss, can improve blood sugar numbers, effectively "reversing" the disease  and reducing the odds of developing type 2 diabetes. However, some people may have underlying factors (such as family history and genetics) that put them at a greater risk of type 2 diabetes, meaning they will always require careful attention to blood sugar levels and lifestyle choices. Returning to old habits will likely put someone back on the road to prediabetes, and eventually, type 2 diabetes   Exercise to Lose Weight  Exercise and a healthy diet may help you lose weight and help better regulate your blood glucose.  Your doctor may suggest specific exercises. EXERCISE IDEAS AND TIPS  Choose low-cost things you enjoy doing, such as walking, bicycling, or exercising to workout videos.  Take stairs instead of the elevator.  Walk during your lunch break.  Park your car further away from work or school.  Go to a gym or an exercise class.  Start with 5 to 10 minutes of exercise each day. Build up to 30 minutes of exercise 4 to 6 days a week. Goal is at least 150 minutes per week.  Wear shoes with good support and comfortable clothes.  Stretch before and  after working out.  Work out until you breathe harder and your heart beats faster.  Drink extra water when you exercise.  Do not do so much that you hurt yourself, feel dizzy, or get very short of breath.  See if your insurance allows you to go to [facility] and participate in the [employer] program

## 2013-11-21 NOTE — Progress Notes (Signed)
Subjective:    Jenna [name] is a 78 y.o. female who presents for an initial evaluation of pre diabetes.  No family history of DM.  NO history of gestational DM.  The patient was initially diagnosed with prediabetes 10/23/13 based on the following criteria:  a1c of 6.1% and FBG of 113.  Cardiovascular risk factors: advanced age (older than 32 for men, 42 for women), dyslipidemia, obesity (BMI >= 30 kg/m2) and sedentary lifestyle  Eye exam current (within one year): yes Weight trend: fluctuating a bit Prior visit with dietician: no Current diet: patient is not following any special diet Current exercise: none  Current monitoring regimen: none  Lab Review Glucose (mg/dL)  Date Value  06/19/2013 113*  02/21/2013 96      Glucose, Bld (mg/dL)  Date Value  09/15/2012 117*  05/29/2012 175*  05/28/2012 116*     CO2 (mmol/L)  Date Value  06/19/2013 22   02/21/2013 28   09/15/2012 31      BUN (mg/dL)  Date Value  06/19/2013 11   02/21/2013 16   09/15/2012 12   05/29/2012 10   05/28/2012 11      Creatinine, Ser (mg/dL)  Date Value  06/19/2013 0.72   02/21/2013 0.73   09/15/2012 0.69    A1c - 6.1% (10/23/2013)    Assessment:   Pre Diabetes Metabolic Syndrome Hypertriglyceridemia Obesity   Plan:    1.  Discussed low CHO diet, limiting serving sizes and increase in non starchy vegetables and lean proteins 2.  Education: about prediabetes, metabolic syndrome and hypertriglyceridemia and link to increased risk of type 2 DM in future.  Goal to lose 15#.  3.  Compliance at present is estimated to be good. Efforts to improve compliance (if necessary) will be directed at dietary modifications: see #1 and increased exercise. 4. Follow up: 6 weeks     Cherre Robins, PharmD, CPP

## 2013-12-03 ENCOUNTER — Other Ambulatory Visit: Payer: Self-pay | Admitting: Family Medicine

## 2013-12-04 NOTE — Telephone Encounter (Signed)
Received refill request. This med not on pt med list in Epic. Last refill 10/14? If approved sent to [facility].

## 2013-12-18 ENCOUNTER — Other Ambulatory Visit: Payer: Self-pay | Admitting: Family Medicine

## 2014-01-11 ENCOUNTER — Ambulatory Visit (INDEPENDENT_AMBULATORY_CARE_PROVIDER_SITE_OTHER): Payer: Medicare Other | Admitting: Pharmacist

## 2014-01-11 ENCOUNTER — Encounter: Payer: Self-pay | Admitting: Pharmacist

## 2014-01-11 VITALS — BP 144/80 | HR 70 | Ht 65.5 in | Wt 213.5 lb

## 2014-01-11 DIAGNOSIS — Z0000 Encounter for general adult medical examination without abnormal findings: Secondary | ICD-10-CM

## 2014-01-11 DIAGNOSIS — Z8619 Personal history of other infectious and parasitic diseases: Secondary | ICD-10-CM

## 2014-01-11 NOTE — Progress Notes (Signed)
Subjective:    Jenna [name] is a 78 y.o. female who presents for Medicare Initial Annual Wellness Visit  Preventive Screening-Counseling & Management  Tobacco History  Smoking status  . Never Smoker   Smokeless tobacco  . Never Used     Current Problems (verified) Patient Active Problem List   Diagnosis Date Noted  . History of shingles 01/11/2014  . Pre-diabetes 11/21/2013  . Obesity (BMI 30.0-34.9) 11/21/2013  . Mixed hypercholesterolemia and hypertriglyceridemia 11/21/2013  . Metabolic syndrome 37/02/6268  . Multiple drug allergies 10/23/2013  . Statin intolerance 10/23/2013  . Osteopenia of the elderly 07/25/2013  . Vitamin D deficiency 06/19/2013  . Unspecified constipation 04/23/2013  . Rectal bleeding 04/23/2013  . Gout 02/21/2013  . Osteoarthritis 02/21/2013  . Fibromyalgia   . HYPERLIPIDEMIA 11/14/2008  . Atrial fibrillation 11/14/2008    Medications Prior to Visit Current Outpatient Prescriptions on File Prior to Visit  Medication Sig Dispense Refill  . Cholecalciferol (VITAMIN D3) 2000 UNITS TABS Take 1 tablet by mouth daily.        . fish oil-omega-3 fatty acids 1000 MG capsule Take 1 g by mouth daily.       [name] levothyroxine (SYNTHROID, LEVOTHROID) 75 MCG tablet TAKE 1 TABLET ONCE DAILY.  30 tablet  11  . meloxicam (MOBIC) 7.5 MG tablet Take 7.5 mg by mouth daily. Takes occasionally      . metoprolol (LOPRESSOR) 50 MG tablet TAKE (1) TABLET BY MOUTH ONCE DAILY.  30 tablet  4  . PROCTOZONE-HC 2.5 % rectal cream APPLY 1 APPLICATORFUL 2 TIMES A DAY AS NEEDED.  30 g  4   No current facility-administered medications on file prior to visit.    Current Medications (verified) Current Outpatient Prescriptions  Medication Sig Dispense Refill  . aspirin EC 81 MG tablet Take 81 mg by mouth daily. Takes 2-3 times weekly      . Cholecalciferol (VITAMIN D3) 2000 UNITS TABS Take 1 tablet by mouth daily.        . fish oil-omega-3 fatty acids 1000 MG capsule Take 1 g  by mouth daily.       [name] levothyroxine (SYNTHROID, LEVOTHROID) 75 MCG tablet TAKE 1 TABLET ONCE DAILY.  30 tablet  11  . meloxicam (MOBIC) 7.5 MG tablet Take 7.5 mg by mouth daily. Takes occasionally      . metoprolol (LOPRESSOR) 50 MG tablet TAKE (1) TABLET BY MOUTH ONCE DAILY.  30 tablet  4  . PROCTOZONE-HC 2.5 % rectal cream APPLY 1 APPLICATORFUL 2 TIMES A DAY AS NEEDED.  30 g  4  . colchicine (COLCRYS) 0.6 MG tablet TAKE (1) TABLET BY MOUTH ONCE DAILY as needed for gout flares       No current facility-administered medications for this visit.     Allergies (verified) Cymbalta; Daypro; Diclofenac sodium; Effexor; Lipitor; Penicillins; Prozac; Sulfa antibiotics; Zocor; Amitriptyline; Milnacipran hcl; and Savella   PAST HISTORY  Family History Family History  Problem Relation Age of Onset  . Coronary artery disease Neg Hx     No premature  . Anesthesia problems Neg Hx   . Hypotension Neg Hx   . Malignant hyperthermia Neg Hx   . Pseudochol deficiency Neg Hx   . CVA Mother   . Stroke Mother   . Osteoporosis Mother   . Cancer Father     prostate  . Bronchitis Sister   . COPD Sister   . Early death Brother     died in [place]  .  Carpal tunnel syndrome Sister   . Cancer Brother     throat  . Cancer Brother     lung  . Heart attack Brother   . Cancer Brother     throat  . Stroke Brother   . Gout Brother   . Heart Problems Brother     stents  . CAD Brother   . Hypertension Brother   . Gout Brother   . Arthritis Brother     Social History History  Substance Use Topics  . Smoking status: Never Smoker   . Smokeless tobacco: Never Used  . Alcohol Use: No     Are there smokers in your home (other than you)? No  Risk Factors Current exercise habits: Home exercise routine includes walking about 30 minutes 3 days a weeks  Dietary issues discussed: patient has been limiting CHO and portion sizes since our visit 11/21/13.  She has lost about 1.5# since then.  Cardiac risk  factors: advanced age (older than 30 for men, 76 for women), family history of premature cardiovascular disease and obesity (BMI >= 30 kg/m2).  Depression Screen (Note: if answer to either of the following is "Yes", a more complete depression screening is indicated)   Over the past 2 weeks, have you felt down, depressed or hopeless? No  Over the past 2 weeks, have you felt little interest or pleasure in doing things? No  Have you lost interest or pleasure in daily life? No  Do you often feel hopeless? No  Do you cry easily over simple problems? No  Activities of Daily Living In your present state of health, do you have any difficulty performing the following activities?:  Driving? No Managing money?  No Feeding yourself? No Getting from bed to chair? No  Climbing a flight of stairs? No Preparing food and eating?: No Bathing or showering? No Getting dressed: No Getting to the toilet? No Using the toilet:No Moving around from place to place: No In the past year have you fallen or had a near fall?:No   Are you sexually active?  Yes  Do you have more than one partner?  No  Hearing Difficulties: No Do you often ask people to speak up or repeat themselves? No Do you experience ringing or noises in your ears? No Do you have difficulty understanding soft or whispered voices? No   Do you feel that you have a problem with memory? No  Do you often misplace items? No  Do you feel safe at home?  Yes  Cognitive Testing  Alert? Yes  Normal Appearance?Yes  Oriented to person? Yes  Place? Yes   Time? Yes  Recall of three objects?  Yes  Can perform simple calculations? Yes  Displays appropriate judgment?Yes  Can read the correct time from a watch face?Yes   Advanced Directives have been discussed with the patient? Yes  List the Names of Other Physician/Practitioners you currently use: 1.  Eye - Dr. Geoffry Paradise and Dr Radford Pax 2.  Cardiologist - Dr Domenic Polite 3.  GI - Dr Wandalee Ferdinand any  recent Medical Services you may have received from other than [facility] providers in the past year (date may be approximate).  Immunization History  Administered Date(s) Administered  . Pneumococcal Conjugate-13 02/21/2013  . Pneumococcal Polysaccharide-23 05/24/2006  . Td 09/21/2004    Screening Tests Health Maintenance  Topic Date Due  . Influenza Vaccine  12/22/2013  . Zostavax  10/24/2015 (Originally 07/20/1997)  . Tetanus/tdap  09/22/2014  . Mammogram  10/17/2015  . Colonoscopy  05/25/2023  . Pneumococcal Polysaccharide Vaccine Age 31 And Over  Completed    All answers were reviewed with the patient and necessary referrals were made:  Cherre Robins, [facility]   01/11/2014   History reviewed: allergies, current medications, past family history, past medical history, past social history, past surgical history and problem list   Objective:     Body mass index is 34.98 kg/(m^2). BP 144/80  Pulse 70  Ht 5' 5.5" (1.664 m)  Wt 213 lb 8 oz (96.843 kg)  BMI 34.98 kg/m2   Assessment:    Medicare Initial Wellness Visit Obesity  Prediabetes     Plan:     During the course of the visit the patient was educated and counseled about appropriate screening and preventive services including:    Pneumococcal vaccine - UTD  Influenza vaccine - UTD  Hepatitis B vaccine - patient declined  Td vaccine - UTD  Shingles / Zostavax - Verified cost of $6.60.  Patient declined today but information about Zostavax given to patient  Screening mammography - UTD  Screening Pap smear and pelvic exam - appt made today  Bone densitometry screening - UTD  Colorectal cancer screening - UTD  Diabetes screening - will continue to monitor  Glaucoma screening - Appt needed, patient reminded  Nutrition counseling - done if office today;  Patient refused appt with nutritionist.  Advanced directives: Discussed and [facility] packet given  Reviewed diet - limiting CHO and continueing  exercise.  Goal weight loss is #15.  So far patient has lost 1.5#.   Diet review for nutrition referral? Offered appt with nutritionist - pt declined.   Patient Instructions (the written plan) was given to the patient.  Medicare Attestation I have personally reviewed: The patient's medical and social history Their use of alcohol, tobacco or illicit drugs Their current medications and supplements The patient's functional ability including ADLs,fall risks, home safety risks, cognitive, and hearing and visual impairment Diet and physical activities Evidence for depression or mood disorders  The patient's weight, height, BMI, and BP/HR have been recorded in the chart.  I have made referrals, counseling, and provided education to the patient based on review of the above and I have provided the patient with a written personalized care plan for preventive services.     Cherre Robins, [facility]   01/11/2014

## 2014-01-11 NOTE — Patient Instructions (Signed)
Health Maintenance Summary    INFLUENZA VACCINE Next Due 12/22/2013      ZOSTAVAX Postponed 10/24/2015 Cost verified today - $6.60    TETANUS/TDAP Next Due 09/22/2014  Last 09/21/2004    MAMMOGRAM Next Due 10/17/2015  Last 10/16/2013   Pneumonia Completed 2014    Dexa / Bone Density Next Due 11/22/2015 Last 11/21/2013   Pap Smear Due Now  Appointment made today   Eye Exam Due Now      COLONOSCOPY Next Due 05/25/2023  Last 05/31/2013       Preventive Care for Adults A healthy lifestyle and preventive care can promote health and wellness. Preventive health guidelines for women include the following key practices.  A routine yearly physical is a good way to check with your health care provider about your health and preventive screening. It is a chance to share any concerns and updates on your health and to receive a thorough exam.  Visit your dentist for a routine exam and preventive care every 6 months. Brush your teeth twice a day and floss once a day. Good oral hygiene prevents tooth decay and gum disease.  The frequency of eye exams is based on your age, health, family medical history, use of contact lenses, and other factors. Follow your health care provider's recommendations for frequency of eye exams.  Eat a healthy diet. Foods like vegetables, fruits, whole grains, low-fat dairy products, and lean protein foods contain the nutrients you need without too many calories. Decrease your intake of foods high in solid fats, added sugars, and salt. Eat the right amount of calories for you.Get information about a proper diet from your health care provider, if necessary.  Regular physical exercise is one of the most important things you can do for your health. Most adults should get at least 150 minutes of moderate-intensity exercise (any activity that increases your heart rate and causes you to sweat) each week. In addition, most adults need muscle-strengthening exercises on 2 or more days a  week.  Maintain a healthy weight. The body mass index (BMI) is a screening tool to identify possible weight problems. It provides an estimate of body fat based on height and weight. Your health care provider can find your BMI and can help you achieve or maintain a healthy weight.For adults 20 years and older:  A BMI below 18.5 is considered underweight.  A BMI of 18.5 to 24.9 is normal.  A BMI of 25 to 29.9 is considered overweight.  A BMI of 30 and above is considered obese.  Maintain normal blood lipids and cholesterol levels by exercising and minimizing your intake of saturated fat. Eat a balanced diet with plenty of fruit and vegetables. Blood tests for lipids and cholesterol should begin at age 36 and be repeated every 5 years. If your lipid or cholesterol levels are high, you are over 50, or you are at high risk for heart disease, you may need your cholesterol levels checked more frequently.Ongoing high lipid and cholesterol levels should be treated with medicines if diet and exercise are not working.  If you smoke, find out from your health care provider how to quit. If you do not use tobacco, do not start.  Lung cancer screening is recommended for adults aged 59-80 years who are at high risk for developing lung cancer because of a history of smoking. A yearly low-dose CT scan of the lungs is recommended for people who have at least a 30-pack-year history of smoking and are a  current smoker or have quit within the past 15 years. A pack year of smoking is smoking an average of 1 pack of cigarettes a day for 1 year (for example: 1 pack a day for 30 years or 2 packs a day for 15 years). Yearly screening should continue until the smoker has stopped smoking for at least 15 years. Yearly screening should be stopped for people who develop a health problem that would prevent them from having lung cancer treatment.  If you are pregnant, do not drink alcohol. If you are breastfeeding, be very  cautious about drinking alcohol. If you are not pregnant and choose to drink alcohol, do not have more than 1 drink per day. One drink is considered to be 12 ounces (355 mL) of beer, 5 ounces (148 mL) of wine, or 1.5 ounces (44 mL) of liquor.  Avoid use of street drugs. Do not share needles with anyone. Ask for help if you need support or instructions about stopping the use of drugs.  High blood pressure causes heart disease and increases the risk of stroke. Your blood pressure should be checked at least every 1 to 2 years. Ongoing high blood pressure should be treated with medicines if weight loss and exercise do not work.  If you are 36-67 years old, ask your health care provider if you should take aspirin to prevent strokes.  Diabetes screening involves taking a blood sample to check your fasting blood sugar level. This should be done once every 3 years, after age 36, if you are within normal weight and without risk factors for diabetes. Testing should be considered at a younger age or be carried out more frequently if you are overweight and have at least 1 risk factor for diabetes.  Breast cancer screening is essential preventive care for women. You should practice "breast self-awareness." This means understanding the normal appearance and feel of your breasts and may include breast self-examination. Any changes detected, no matter how small, should be reported to a health care provider. Women in their 68s and 30s should have a clinical breast exam (CBE) by a health care provider as part of a regular health exam every 1 to 3 years. After age 32, women should have a CBE every year. Starting at age 74, women should consider having a mammogram (breast X-ray test) every year. Women who have a family history of breast cancer should talk to their health care provider about genetic screening. Women at a high risk of breast cancer should talk to their health care providers about having an MRI and a mammogram  every year.  Breast cancer gene (BRCA)-related cancer risk assessment is recommended for women who have family members with BRCA-related cancers. BRCA-related cancers include breast, ovarian, tubal, and peritoneal cancers. Having family members with these cancers may be associated with an increased risk for harmful changes (mutations) in the breast cancer genes BRCA1 and BRCA2. Results of the assessment will determine the need for genetic counseling and BRCA1 and BRCA2 testing.  Routine pelvic exams to screen for cancer are no longer recommended for nonpregnant women who are considered low risk for cancer of the pelvic organs (ovaries, uterus, and vagina) and who do not have symptoms. Ask your health care provider if a screening pelvic exam is right for you.  If you have had past treatment for cervical cancer or a condition that could lead to cancer, you need Pap tests and screening for cancer for at least 20 years after your treatment. If  Pap tests have been discontinued, your risk factors (such as having a new sexual partner) need to be reassessed to determine if screening should be resumed. Some women have medical problems that increase the chance of getting cervical cancer. In these cases, your health care provider may recommend more frequent screening and Pap tests.  The HPV test is an additional test that may be used for cervical cancer screening. The HPV test looks for the virus that can cause the cell changes on the cervix. The cells collected during the Pap test can be tested for HPV. The HPV test could be used to screen women aged 76 years and older, and should be used in women of any age who have unclear Pap test results. After the age of 37, women should have HPV testing at the same frequency as a Pap test.  Colorectal cancer can be detected and often prevented. Most routine colorectal cancer screening begins at the age of 20 years and continues through age 46 years. However, your health care  provider may recommend screening at an earlier age if you have risk factors for colon cancer. On a yearly basis, your health care provider may provide home test kits to check for hidden blood in the stool. Use of a small camera at the end of a tube, to directly examine the colon (sigmoidoscopy or colonoscopy), can detect the earliest forms of colorectal cancer. Talk to your health care provider about this at age 33, when routine screening begins. Direct exam of the colon should be repeated every 5-10 years through age 68 years, unless early forms of pre-cancerous polyps or small growths are found.  People who are at an increased risk for hepatitis B should be screened for this virus. You are considered at high risk for hepatitis B if:  You were born in a country where hepatitis B occurs often. Talk with your health care provider about which countries are considered high risk.  Your parents were born in a high-risk country and you have not received a shot to protect against hepatitis B (hepatitis B vaccine).  You have HIV or AIDS.  You use needles to inject street drugs.  You live with, or have sex with, someone who has hepatitis B.  You get hemodialysis treatment.  You take certain medicines for conditions like cancer, organ transplantation, and autoimmune conditions.  Hepatitis C blood testing is recommended for all people born from 1 through 1965 and any individual with known risks for hepatitis C.  Practice safe sex. Use condoms and avoid high-risk sexual practices to reduce the spread of sexually transmitted infections (STIs). STIs include gonorrhea, chlamydia, syphilis, trichomonas, herpes, HPV, and human immunodeficiency virus (HIV). Herpes, HIV, and HPV are viral illnesses that have no cure. They can result in disability, cancer, and death.  You should be screened for sexually transmitted illnesses (STIs) including gonorrhea and chlamydia if:  You are sexually active and are  younger than 24 years.  You are older than 24 years and your health care provider tells you that you are at risk for this type of infection.  Your sexual activity has changed since you were last screened and you are at an increased risk for chlamydia or gonorrhea. Ask your health care provider if you are at risk.  If you are at risk of being infected with HIV, it is recommended that you take a prescription medicine daily to prevent HIV infection. This is called preexposure prophylaxis (PrEP). You are considered at risk if:  You are a heterosexual woman, are sexually active, and are at increased risk for HIV infection.  You take drugs by injection.  You are sexually active with a partner who has HIV.  Talk with your health care provider about whether you are at high risk of being infected with HIV. If you choose to begin PrEP, you should first be tested for HIV. You should then be tested every 3 months for as long as you are taking PrEP.  Osteoporosis is a disease in which the bones lose minerals and strength with aging. This can result in serious bone fractures or breaks. The risk of osteoporosis can be identified using a bone density scan. Women ages 31 years and over and women at risk for fractures or osteoporosis should discuss screening with their health care providers. Ask your health care provider whether you should take a calcium supplement or vitamin D to reduce the rate of osteoporosis.  Menopause can be associated with physical symptoms and risks. Hormone replacement therapy is available to decrease symptoms and risks. You should talk to your health care provider about whether hormone replacement therapy is right for you.  Use sunscreen. Apply sunscreen liberally and repeatedly throughout the day. You should seek shade when your shadow is shorter than you. Protect yourself by wearing long sleeves, pants, a wide-brimmed hat, and sunglasses year round, whenever you are outdoors.  Once a  month, do a whole body skin exam, using a mirror to look at the skin on your back. Tell your health care provider of new moles, moles that have irregular borders, moles that are larger than a pencil eraser, or moles that have changed in shape or color.  Stay current with required vaccines (immunizations).  Influenza vaccine. All adults should be immunized every year.  Tetanus, diphtheria, and acellular pertussis (Td, Tdap) vaccine. Pregnant women should receive 1 dose of Tdap vaccine during each pregnancy. The dose should be obtained regardless of the length of time since the last dose. Immunization is preferred during the 27th-36th week of gestation. An adult who has not previously received Tdap or who does not know her vaccine status should receive 1 dose of Tdap. This initial dose should be followed by tetanus and diphtheria toxoids (Td) booster doses every 10 years. Adults with an unknown or incomplete history of completing a 3-dose immunization series with Td-containing vaccines should begin or complete a primary immunization series including a Tdap dose. Adults should receive a Td booster every 10 years.  Varicella vaccine. An adult without evidence of immunity to varicella should receive 2 doses or a second dose if she has previously received 1 dose. Pregnant females who do not have evidence of immunity should receive the first dose after pregnancy. This first dose should be obtained before leaving the health care facility. The second dose should be obtained 4-8 weeks after the first dose.  Human papillomavirus (HPV) vaccine. Females aged 13-26 years who have not received the vaccine previously should obtain the 3-dose series. The vaccine is not recommended for use in pregnant females. However, pregnancy testing is not needed before receiving a dose. If a female is found to be pregnant after receiving a dose, no treatment is needed. In that case, the remaining doses should be delayed until after the  pregnancy. Immunization is recommended for any person with an immunocompromised condition through the age of 65 years if she did not get any or all doses earlier. During the 3-dose series, the second dose should be obtained  4-8 weeks after the first dose. The third dose should be obtained 24 weeks after the first dose and 16 weeks after the second dose.  Zoster vaccine. One dose is recommended for adults aged 27 years or older unless certain conditions are present.  Measles, mumps, and rubella (MMR) vaccine. Adults born before 65 generally are considered immune to measles and mumps. Adults born in [birth year] or later should have 1 or more doses of MMR vaccine unless there is a contraindication to the vaccine or there is laboratory evidence of immunity to each of the three diseases. A routine second dose of MMR vaccine should be obtained at least 28 days after the first dose for students attending postsecondary schools, health care workers, or international travelers. People who received inactivated measles vaccine or an unknown type of measles vaccine during 1963-1967 should receive 2 doses of MMR vaccine. People who received inactivated mumps vaccine or an unknown type of mumps vaccine before 1979 and are at high risk for mumps infection should consider immunization with 2 doses of MMR vaccine. For females of childbearing age, rubella immunity should be determined. If there is no evidence of immunity, females who are not pregnant should be vaccinated. If there is no evidence of immunity, females who are pregnant should delay immunization until after pregnancy. Unvaccinated health care workers born before [birth year] who lack laboratory evidence of measles, mumps, or rubella immunity or laboratory confirmation of disease should consider measles and mumps immunization with 2 doses of MMR vaccine or rubella immunization with 1 dose of MMR vaccine.  Pneumococcal 13-valent conjugate (PCV13) vaccine. When indicated, a person  who is uncertain of her immunization history and has no record of immunization should receive the PCV13 vaccine. An adult aged 54 years or older who has certain medical conditions and has not been previously immunized should receive 1 dose of PCV13 vaccine. This PCV13 should be followed with a dose of pneumococcal polysaccharide (PPSV23) vaccine. The PPSV23 vaccine dose should be obtained at least 8 weeks after the dose of PCV13 vaccine. An adult aged 65 years or older who has certain medical conditions and previously received 1 or more doses of PPSV23 vaccine should receive 1 dose of PCV13. The PCV13 vaccine dose should be obtained 1 or more years after the last PPSV23 vaccine dose.  Pneumococcal polysaccharide (PPSV23) vaccine. When PCV13 is also indicated, PCV13 should be obtained first. All adults aged 51 years and older should be immunized. An adult younger than age 39 years who has certain medical conditions should be immunized. Any person who resides in a nursing home or long-term care facility should be immunized. An adult smoker should be immunized. People with an immunocompromised condition and certain other conditions should receive both PCV13 and PPSV23 vaccines. People with human immunodeficiency virus (HIV) infection should be immunized as soon as possible after diagnosis. Immunization during chemotherapy or radiation therapy should be avoided. Routine use of PPSV23 vaccine is not recommended for American Indians, [place] Natives, or people younger than 65 years unless there are medical conditions that require PPSV23 vaccine. When indicated, people who have unknown immunization and have no record of immunization should receive PPSV23 vaccine. One-time revaccination 5 years after the first dose of PPSV23 is recommended for people aged 19-64 years who have chronic kidney failure, nephrotic syndrome, asplenia, or immunocompromised conditions. People who received 1-2 doses of PPSV23 before age 58 years  should receive another dose of PPSV23 vaccine at age 17 years or later if at least 70  years have passed since the previous dose. Doses of PPSV23 are not needed for people immunized with PPSV23 at or after age 74 years.  Meningococcal vaccine. Adults with asplenia or persistent complement component deficiencies should receive 2 doses of quadrivalent meningococcal conjugate (MenACWY-D) vaccine. The doses should be obtained at least 2 months apart. Microbiologists working with certain meningococcal bacteria, [employer] recruits, people at risk during an outbreak, and people who travel to or live in countries with a high rate of meningitis should be immunized. A first-year college student up through age 86 years who is living in a residence hall should receive a dose if she did not receive a dose on or after her 16th birthday. Adults who have certain high-risk conditions should receive one or more doses of vaccine.  Hepatitis A vaccine. Adults who wish to be protected from this disease, have certain high-risk conditions, work with hepatitis A-infected animals, work in hepatitis A research labs, or travel to or work in countries with a high rate of hepatitis A should be immunized. Adults who were previously unvaccinated and who anticipate close contact with an international adoptee during the first 60 days after arrival in the [place] from a country with a high rate of hepatitis A should be immunized.  Hepatitis B vaccine. Adults who wish to be protected from this disease, have certain high-risk conditions, may be exposed to blood or other infectious body fluids, are household contacts or sex partners of hepatitis B positive people, are clients or workers in certain care facilities, or travel to or work in countries with a high rate of hepatitis B should be immunized.  Haemophilus influenzae type b (Hib) vaccine. A previously unvaccinated person with asplenia or sickle cell disease or having a scheduled  splenectomy should receive 1 dose of Hib vaccine. Regardless of previous immunization, a recipient of a hematopoietic stem cell transplant should receive a 3-dose series 6-12 months after her successful transplant. Hib vaccine is not recommended for adults with HIV infection.  Preventive Services / Frequency Ages 80 to 56 years  Blood pressure check.** / Every 1 to 2 years.  Lipid and cholesterol check.** / Every 5 years beginning at age 59.  Clinical breast exam.** / Every 3 years for women in their 60s and 36s.  BRCA-related cancer risk assessment.** / For women who have family members with a BRCA-related cancer (breast, ovarian, tubal, or peritoneal cancers).  Pap test.** / Every 2 years from ages 7 through 34. Every 3 years starting at age 79 through age 19 or 80 with a history of 3 consecutive normal Pap tests.  HPV screening.** / Every 3 years from ages 31 through ages 67 to 4 with a history of 3 consecutive normal Pap tests.  Hepatitis C blood test.** / For any individual with known risks for hepatitis C.  Skin self-exam. / Monthly.  Influenza vaccine. / Every year.  Tetanus, diphtheria, and acellular pertussis (Tdap, Td) vaccine.** / Consult your health care provider. Pregnant women should receive 1 dose of Tdap vaccine during each pregnancy. 1 dose of Td every 10 years.  Varicella vaccine.** / Consult your health care provider. Pregnant females who do not have evidence of immunity should receive the first dose after pregnancy.  HPV vaccine. / 3 doses over 6 months, if 6 and younger. The vaccine is not recommended for use in pregnant females. However, pregnancy testing is not needed before receiving a dose.  Measles, mumps, rubella (MMR) vaccine.** / You need at least 1  dose of MMR if you were born in 1957 or later. You may also need a 2nd dose. For females of childbearing age, rubella immunity should be determined. If there is no evidence of immunity, females who are not  pregnant should be vaccinated. If there is no evidence of immunity, females who are pregnant should delay immunization until after pregnancy.  Pneumococcal 13-valent conjugate (PCV13) vaccine.** / Consult your health care provider.  Pneumococcal polysaccharide (PPSV23) vaccine.** / 1 to 2 doses if you smoke cigarettes or if you have certain conditions.  Meningococcal vaccine.** / 1 dose if you are age 26 to 66 years and a Market researcher living in a residence hall, or have one of several medical conditions, you need to get vaccinated against meningococcal disease. You may also need additional booster doses.  Hepatitis A vaccine.** / Consult your health care provider. Ages 46 years and over  Blood pressure check.** / Every 1 to 2 years.  Lipid and cholesterol check.** / Every 5 years beginning at age 34 years.  Lung cancer screening. / Every year if you are aged 73-80 years and have a 30-pack-year history of smoking and currently smoke or have quit within the past 15 years. Yearly screening is stopped once you have quit smoking for at least 15 years or develop a health problem that would prevent you from having lung cancer treatment.  Clinical breast exam.** / Every year after age 40 years.  BRCA-related cancer risk assessment.** / For women who have family members with a BRCA-related cancer (breast, ovarian, tubal, or peritoneal cancers).  Mammogram.** / Every year beginning at age 44 years and continuing for as long as you are in good health. Consult with your health care provider.  Pap test.** / Every 3 years starting at age 84 years through age 25 or 55 years with 3 consecutive normal Pap tests. Testing can be stopped between 65 and 70 years with 3 consecutive normal Pap tests and no abnormal Pap or HPV tests in the past 10 years.  HPV screening.** / Every 3 years from ages 63 years through ages 42 or 10 years with a history of 3 consecutive normal Pap tests. Testing can be  stopped between 65 and 70 years with 3 consecutive normal Pap tests and no abnormal Pap or HPV tests in the past 10 years.  Fecal occult blood test (FOBT) of stool. / Every year beginning at age 26 years and continuing until age 53 years. You may not need to do this test if you get a colonoscopy every 10 years.  Flexible sigmoidoscopy or colonoscopy.** / Every 5 years for a flexible sigmoidoscopy or every 10 years for a colonoscopy beginning at age 60 years and continuing until age 28 years.  Hepatitis C blood test.** / For all people born from [birth year] through 1963 and any individual with known risks for hepatitis C.  Osteoporosis screening.** / A one-time screening for women ages 64 years and over and women at risk for fractures or osteoporosis.  Skin self-exam. / Monthly.  Influenza vaccine. / Every year.  Tetanus, diphtheria, and acellular pertussis (Tdap/Td) vaccine.** / 1 dose of Td every 10 years.  Varicella vaccine.** / Consult your health care provider.  Zoster vaccine.** / 1 dose for adults aged 45 years or older.  Pneumococcal 13-valent conjugate (PCV13) vaccine.** / Consult your health care provider.  Pneumococcal polysaccharide (PPSV23) vaccine.** / 1 dose for all adults aged 19 years and older.  Meningococcal vaccine.** / Consult your health  care provider.  Hepatitis A vaccine.** / Consult your health care provider.  Hepatitis B vaccine.** / Consult your health care provider.  Haemophilus influenzae type b (Hib) vaccine.** / Consult your health care provider. ** Family history and personal history of risk and conditions may change your health care provider's recommendations. Document Released: 07/06/2001 Document Revised: 09/24/2013 Document Reviewed: 10/05/2010 [facility] Patient Information 2015 [place], [place]. This information is not intended to replace advice given to you by your health care provider. Make sure you discuss any questions you have with your health care  provider.

## 2014-01-21 ENCOUNTER — Emergency Department (HOSPITAL_COMMUNITY): Payer: Medicare Other

## 2014-01-21 ENCOUNTER — Emergency Department (HOSPITAL_COMMUNITY)
Admission: EM | Admit: 2014-01-21 | Discharge: 2014-01-22 | Disposition: A | Discharge status: Home / Self Care | Payer: Medicare Other | Attending: Emergency Medicine | Admitting: Emergency Medicine

## 2014-01-21 ENCOUNTER — Encounter (HOSPITAL_COMMUNITY): Payer: Self-pay | Admitting: Emergency Medicine

## 2014-01-21 DIAGNOSIS — Z7982 Long term (current) use of aspirin: Secondary | ICD-10-CM | POA: Diagnosis not present

## 2014-01-21 DIAGNOSIS — E785 Hyperlipidemia, unspecified: Secondary | ICD-10-CM | POA: Insufficient documentation

## 2014-01-21 DIAGNOSIS — Z85828 Personal history of other malignant neoplasm of skin: Secondary | ICD-10-CM | POA: Insufficient documentation

## 2014-01-21 DIAGNOSIS — R079 Chest pain, unspecified: Secondary | ICD-10-CM | POA: Insufficient documentation

## 2014-01-21 DIAGNOSIS — E039 Hypothyroidism, unspecified: Secondary | ICD-10-CM | POA: Diagnosis not present

## 2014-01-21 DIAGNOSIS — Z8719 Personal history of other diseases of the digestive system: Secondary | ICD-10-CM | POA: Diagnosis not present

## 2014-01-21 DIAGNOSIS — Z880 Allergy status to penicillin: Secondary | ICD-10-CM | POA: Diagnosis not present

## 2014-01-21 DIAGNOSIS — Z8669 Personal history of other diseases of the nervous system and sense organs: Secondary | ICD-10-CM | POA: Diagnosis not present

## 2014-01-21 DIAGNOSIS — R5383 Other fatigue: Secondary | ICD-10-CM | POA: Diagnosis not present

## 2014-01-21 DIAGNOSIS — Z79899 Other long term (current) drug therapy: Secondary | ICD-10-CM | POA: Diagnosis not present

## 2014-01-21 DIAGNOSIS — M1990 Unspecified osteoarthritis, unspecified site: Secondary | ICD-10-CM | POA: Insufficient documentation

## 2014-01-21 DIAGNOSIS — R5381 Other malaise: Secondary | ICD-10-CM | POA: Insufficient documentation

## 2014-01-21 DIAGNOSIS — R0602 Shortness of breath: Secondary | ICD-10-CM | POA: Insufficient documentation

## 2014-01-21 DIAGNOSIS — I4891 Unspecified atrial fibrillation: Secondary | ICD-10-CM | POA: Insufficient documentation

## 2014-01-21 DIAGNOSIS — Z8619 Personal history of other infectious and parasitic diseases: Secondary | ICD-10-CM | POA: Diagnosis not present

## 2014-01-21 DIAGNOSIS — R0789 Other chest pain: Secondary | ICD-10-CM | POA: Diagnosis not present

## 2014-01-21 MED ORDER — ASPIRIN 81 MG PO CHEW
324.0000 mg | CHEWABLE_TABLET | Freq: Once | ORAL | Status: AC
  Administered 2014-01-21: 324 mg via ORAL
  Filled 2014-01-21: qty 4

## 2014-01-21 MED ORDER — NITROGLYCERIN 2 % TD OINT
1.0000 [in_us] | TOPICAL_OINTMENT | Freq: Once | TRANSDERMAL | Status: AC
  Administered 2014-01-21: 1 [in_us] via TOPICAL
  Filled 2014-01-21: qty 1

## 2014-01-21 MED ORDER — NITROGLYCERIN 0.4 MG SL SUBL
0.4000 mg | SUBLINGUAL_TABLET | SUBLINGUAL | Status: DC | PRN
  Filled 2014-01-21: qty 1

## 2014-01-21 MED ORDER — SODIUM CHLORIDE 0.9 % IV SOLN
1000.0000 mL | INTRAVENOUS | Status: DC
  Administered 2014-01-21: 1000 mL via INTRAVENOUS

## 2014-01-21 NOTE — ED Notes (Signed)
Pt reports SOB and tightness in her chest that "has been on and off" today. Pt reports similar s/s over the last "few months" but has always gone away.

## 2014-01-21 NOTE — ED Provider Notes (Signed)
CSN: [id number]     Arrival date & time 01/21/14  2059 History  This chart was scribed for Jenna Rank, MD by Lowella Petties, ED Scribe. The patient was seen in room APA01/APA01. Patient's care was started at 9:48 PM.   Chief Complaint  Patient presents with  . Shortness of Breath   The history is provided by the patient. No language interpreter was used.   HPI Comments: Jenna [name] is a 78 y.o. female who presents to the Emergency Department complaining of acute SOB onset 6 hours ago. She denies any modifying factors making her SOB better or worse. She reports left shoulder pain which began suddenly this evening. She reports intermittent chest tightness, fatigue, and lightheadedness for the past few months. She reports a history of atrial fibrillation. She denies history of stent placement. She reports having a stress test done here a few years ago and has no history of heart disease.Jenna [name]   Past Medical History  Diagnosis Date  . OA (osteoarthritis)   . Hypothyroidism   . Fibromyalgia   . Osteoporosis   . PUD (peptic ulcer disease)   . DDD (degenerative disc disease)   . Herpes zoster   . AF (atrial fibrillation)   . Cataract   . Hyperlipidemia   . History of skin cancer   . Skin cancer of nose     Jenna [name]   Past Surgical History  Procedure Laterality Date  . Cesarean section    . Breast biopsy      Right  . Cataract extraction w/phaco  10/28/2011    Procedure: CATARACT EXTRACTION PHACO AND INTRAOCULAR LENS PLACEMENT (IOC);  Surgeon: Tonny Branch, MD;  Location: [facility];  Service: Ophthalmology;  Laterality: Right;  CDE 18.38  . Cataract extraction w/phaco  02/14/2012    Procedure: CATARACT EXTRACTION PHACO AND INTRAOCULAR LENS PLACEMENT (IOC);  Surgeon: Tonny Branch, MD;  Location: [facility];  Service: Ophthalmology;  Laterality: Left;  CDE 17.20  . Colonoscopy N/A 05/31/2013    Procedure: COLONOSCOPY;  Surgeon: Rogene Houston, MD;  Location: [facility];  Service: Endoscopy;   Laterality: N/A;  240  . Eye surgery    . Abdominal hysterectomy      partial  . Fracture surgery  1952    fractured right arm   Family History  Problem Relation Age of Onset  . Coronary artery disease Neg Hx     No premature  . Anesthesia problems Neg Hx   . Hypotension Neg Hx   . Malignant hyperthermia Neg Hx   . Pseudochol deficiency Neg Hx   . CVA Mother   . Stroke Mother   . Osteoporosis Mother   . Cancer Father     prostate  . Bronchitis Sister   . COPD Sister   . Early death Brother     died in [place]  . Carpal tunnel syndrome Sister   . Cancer Brother     throat  . Cancer Brother     lung  . Heart attack Brother   . Cancer Brother     throat  . Stroke Brother   . Gout Brother   . Heart Problems Brother     stents  . CAD Brother   . Hypertension Brother   . Gout Brother   . Arthritis Brother    History  Substance Use Topics  . Smoking status: Never Smoker   . Smokeless tobacco: Never Used  . Alcohol Use: No   OB  History   Grav Para Term Preterm Abortions TAB SAB Ect Mult Living                 Review of Systems  Constitutional: Positive for fatigue.  Respiratory: Positive for chest tightness and shortness of breath.   Neurological: Positive for light-headedness.  All other systems reviewed and are negative.  Allergies  Cymbalta; Daypro; Diclofenac sodium; Effexor; Lipitor; Penicillins; Prozac; Sulfa antibiotics; Zocor; Amitriptyline; Milnacipran hcl; and Savella  Home Medications   Prior to Admission medications   Medication Sig Start Date End Date Taking? Authorizing Provider  aspirin EC 81 MG tablet Take 81 mg by mouth daily. Takes 2-3 times weekly    Historical Provider, MD  Cholecalciferol (VITAMIN D3) 2000 UNITS TABS Take 1 tablet by mouth daily.      Historical Provider, MD  colchicine (COLCRYS) 0.6 MG tablet TAKE (1) TABLET BY MOUTH ONCE DAILY as needed for gout flares    Chipper Herb, MD  fish oil-omega-3 fatty acids 1000 MG capsule  Take 1 g by mouth daily.     Historical Provider, MD  levothyroxine (SYNTHROID, LEVOTHROID) 75 MCG tablet TAKE 1 TABLET ONCE DAILY.    Chipper Herb, MD  meloxicam (MOBIC) 7.5 MG tablet Take 7.5 mg by mouth daily. Takes occasionally    Historical Provider, MD  metoprolol (LOPRESSOR) 50 MG tablet TAKE (1) TABLET BY MOUTH ONCE DAILY.    Chipper Herb, MD  PROCTOZONE-HC 2.5 % rectal cream APPLY 1 APPLICATORFUL 2 TIMES A DAY AS NEEDED. 01/22/13   [name], MD   Triage Vitals: BP 151/64  Pulse 59  Temp(Src) 98.2 F (36.8 C) (Oral)  Resp 16  Ht 5\' 4"  (1.626 m)  Wt 210 lb (95.255 kg)  BMI 36.03 kg/m2  SpO2 97% Physical Exam  Nursing note and vitals reviewed. Constitutional: She appears well-developed and well-nourished. No distress.  HENT:  Head: Normocephalic and atraumatic.  Right Ear: External ear normal.  Left Ear: External ear normal.  Eyes: Conjunctivae are normal. Right eye exhibits no discharge. Left eye exhibits no discharge. No scleral icterus.  Neck: Neck supple. No tracheal deviation present.  Cardiovascular: Normal rate, regular rhythm and intact distal pulses.   Pulmonary/Chest: Effort normal and breath sounds normal. No stridor. No respiratory distress. She has no wheezes. She has no rales.  Abdominal: Soft. Bowel sounds are normal. She exhibits no distension. There is no tenderness. There is no rebound and no guarding.  Musculoskeletal: She exhibits no edema and no tenderness.  Neurological: She is alert. She has normal strength. No cranial nerve deficit (no facial droop, extraocular movements intact, no slurred speech) or sensory deficit. She exhibits normal muscle tone. She displays no seizure activity. Coordination normal.  Skin: Skin is warm and dry. No rash noted.  Psychiatric: She has a normal mood and affect.    ED Course  Procedures (including critical care time) DIAGNOSTIC STUDIES: Oxygen Saturation is 97% on room air, normal by my interpretation.     COORDINATION OF CARE: 9:54 PM-Discussed treatment plan which includes blood work and CXR with pt at bedside and pt agreed to plan.   Labs Review Labs Reviewed  APTT  CBC  COMPREHENSIVE METABOLIC PANEL  PROTIME-INR  TROPONIN I  PRO B NATRIURETIC PEPTIDE  D-DIMER, QUANTITATIVE    Imaging Review No results found.   EKG Interpretation   Date/Time:  Monday January 21 2014 21:37:44 EDT Ventricular Rate:  58 PR Interval:  171 QRS Duration: 89 QT  Interval:  457 QTC Calculation: 449 R Axis:   52 Text Interpretation:  Sinus rhythm Abnormal R-wave progression, early  transition Nonspecific T abnormalities, anterior leads Baseline wander in  lead(s) V3 No significant change since last tracing except PACs resolved  Confirmed by Eural Holzschuh  MD-J, [employer] ([id number]) on 01/21/2014 11:22:30 PM      MDM  Labs pending.  Will check CXR, Cardiac enzymes, BNP, troponin.   Anticipate admission for cardiac rule out unless another etiology is evident on her xrays, labs.  I personally performed the services described in this documentation, which was scribed in my presence.  The recorded information has been reviewed and is accurate.    Jenna Rank, MD 01/21/14 [phone]

## 2014-01-22 DIAGNOSIS — R0602 Shortness of breath: Secondary | ICD-10-CM | POA: Diagnosis not present

## 2014-01-22 LAB — CBC
HCT: 38.5 % (ref 36.0–46.0)
Hemoglobin: 13.4 g/dL (ref 12.0–15.0)
MCH: 33.2 pg (ref 26.0–34.0)
MCHC: 34.8 g/dL (ref 30.0–36.0)
MCV: 95.3 fL (ref 78.0–100.0)
PLATELETS: 159 10*3/uL (ref 150–400)
RBC: 4.04 MIL/uL (ref 3.87–5.11)
RDW: 12.9 % (ref 11.5–15.5)
WBC: 5 10*3/uL (ref 4.0–10.5)

## 2014-01-22 LAB — COMPREHENSIVE METABOLIC PANEL
ALBUMIN: 3.7 g/dL (ref 3.5–5.2)
ALK PHOS: 92 U/L (ref 39–117)
ALT: 12 U/L (ref 0–35)
AST: 15 U/L (ref 0–37)
Anion gap: 9 (ref 5–15)
BILIRUBIN TOTAL: 0.6 mg/dL (ref 0.3–1.2)
BUN: 9 mg/dL (ref 6–23)
CHLORIDE: 102 meq/L (ref 96–112)
CO2: 29 mEq/L (ref 19–32)
Calcium: 9.1 mg/dL (ref 8.4–10.5)
Creatinine, Ser: 0.74 mg/dL (ref 0.50–1.10)
GFR calc Af Amer: >90 mL/min (ref >90)
GFR calc non Af Amer: 81 mL/min — ABNORMAL LOW (ref >90)
Glucose, Bld: 111 mg/dL — ABNORMAL HIGH (ref 70–99)
POTASSIUM: 4.6 meq/L (ref 3.7–5.3)
SODIUM: 140 meq/L (ref 137–147)
Total Protein: 6.5 g/dL (ref 6.0–8.3)

## 2014-01-22 LAB — PRO B NATRIURETIC PEPTIDE: PRO B NATRI PEPTIDE: 78.7 pg/mL (ref 0–450)

## 2014-01-22 LAB — PROTIME-INR
INR: 1.02 (ref 0.00–1.49)
Prothrombin Time: 13.4 seconds (ref 11.6–15.2)

## 2014-01-22 LAB — TROPONIN I

## 2014-01-22 LAB — D-DIMER, QUANTITATIVE (NOT AT ARMC): D-Dimer, Quant: 0.35 ug/mL-FEU (ref 0.00–0.48)

## 2014-01-22 LAB — APTT: APTT: 29 s (ref 24–37)

## 2014-01-22 NOTE — Discharge Instructions (Signed)
Followup with the cardiology clinic to discuss possible stress testing. The contact information has been provided in this discharge summary.  Return to the emergency department if you develop significant worsening of your symptoms, or any other new and concerning symptoms.   Chest Pain (Nonspecific) It is often hard to give a specific diagnosis for the cause of chest pain. There is always a chance that your pain could be related to something serious, such as a heart attack or a blood clot in the lungs. You need to follow up with your health care provider for further evaluation. CAUSES   Heartburn.  Pneumonia or bronchitis.  Anxiety or stress.  Inflammation around your heart (pericarditis) or lung (pleuritis or pleurisy).  A blood clot in the lung.  A collapsed lung (pneumothorax). It can develop suddenly on its own (spontaneous pneumothorax) or from trauma to the chest.  Shingles infection (herpes zoster virus). The chest wall is composed of bones, muscles, and cartilage. Any of these can be the source of the pain.  The bones can be bruised by injury.  The muscles or cartilage can be strained by coughing or overwork.  The cartilage can be affected by inflammation and become sore (costochondritis). DIAGNOSIS  Lab tests or other studies may be needed to find the cause of your pain. Your health care provider may have you take a test called an ambulatory electrocardiogram (ECG). An ECG records your heartbeat patterns over a 24-hour period. You may also have other tests, such as:  Transthoracic echocardiogram (TTE). During echocardiography, sound waves are used to evaluate how blood flows through your heart.  Transesophageal echocardiogram (TEE).  Cardiac monitoring. This allows your health care provider to monitor your heart rate and rhythm in real time.  Holter monitor. This is a portable device that records your heartbeat and can help diagnose heart arrhythmias. It allows your  health care provider to track your heart activity for several days, if needed.  Stress tests by exercise or by giving medicine that makes the heart beat faster. TREATMENT   Treatment depends on what may be causing your chest pain. Treatment may include:  Acid blockers for heartburn.  Anti-inflammatory medicine.  Pain medicine for inflammatory conditions.  Antibiotics if an infection is present.  You may be advised to change lifestyle habits. This includes stopping smoking and avoiding alcohol, caffeine, and chocolate.  You may be advised to keep your head raised (elevated) when sleeping. This reduces the chance of acid going backward from your stomach into your esophagus. Most of the time, nonspecific chest pain will improve within 2-3 days with rest and mild pain medicine.  HOME CARE INSTRUCTIONS   If antibiotics were prescribed, take them as directed. Finish them even if you start to feel better.  For the next few days, avoid physical activities that bring on chest pain. Continue physical activities as directed.  Do not use any tobacco products, including cigarettes, chewing tobacco, or electronic cigarettes.  Avoid drinking alcohol.  Only take medicine as directed by your health care provider.  Follow your health care provider's suggestions for further testing if your chest pain does not go away.  Keep any follow-up appointments you made. If you do not go to an appointment, you could develop lasting (chronic) problems with pain. If there is any problem keeping an appointment, call to reschedule. SEEK MEDICAL CARE IF:   Your chest pain does not go away, even after treatment.  You have a rash with blisters on your chest.  You have a fever. SEEK IMMEDIATE MEDICAL CARE IF:   You have increased chest pain or pain that spreads to your arm, neck, jaw, back, or abdomen.  You have shortness of breath.  You have an increasing cough, or you cough up blood.  You have severe  back or abdominal pain.  You feel nauseous or vomit.  You have severe weakness.  You faint.  You have chills. This is an emergency. Do not wait to see if the pain will go away. Get medical help at once. Call your local emergency services (911 in [place] Do not drive yourself to the hospital. MAKE SURE YOU:   Understand these instructions.  Will watch your condition.  Will get help right away if you are not doing well or get worse. Document Released: 02/17/2005 Document Revised: 05/15/2013 Document Reviewed: 12/14/2007 [facility] Patient Information 2015 [place], [place]. This information is not intended to replace advice given to you by your health care provider. Make sure you discuss any questions you have with your health care provider.

## 2014-01-22 NOTE — ED Provider Notes (Signed)
Patient is a 78 year old female who presents with complaints of intermittent tightness in her chest that has been going on for the past month. She had an episode this evening that was somewhat worse, so she presents for evaluation. She was initially seen by Dr. Tomi Bamberger and signed out at shift change awaiting results of her chest x-ray and laboratory studies.  These all returned unremarkable. Her troponin was negative and EKG is unchanged. Her symptoms are atypical for cardiac pain and I doubt a cardiac etiology. Her chest x-ray and BNP did not suggest CHF, and d-dimer was negative which in this situation I feel rules out a pulmonary embolism.  I spoke with the patient and her husband regarding the final disposition. The patient would prefer to go home and I feel as though this is a reasonable course of action. She will be given the followup information for the cardiologist here in [place] with whom she can followup to discuss a possible stress test. She understands to return in the meantime if her symptoms substantially worsen or change.  Veryl Speak, MD 01/22/14 [phone]

## 2014-01-25 ENCOUNTER — Ambulatory Visit (INDEPENDENT_AMBULATORY_CARE_PROVIDER_SITE_OTHER): Payer: Medicare Other | Admitting: Nurse Practitioner

## 2014-01-25 ENCOUNTER — Encounter: Payer: Self-pay | Admitting: Nurse Practitioner

## 2014-01-25 VITALS — BP 120/58 | HR 65 | Temp 97.7°F | Ht 64.0 in | Wt 212.0 lb

## 2014-01-25 DIAGNOSIS — R609 Edema, unspecified: Secondary | ICD-10-CM

## 2014-01-25 DIAGNOSIS — R0602 Shortness of breath: Secondary | ICD-10-CM

## 2014-01-25 MED ORDER — PREDNISONE 20 MG PO TABS: ORAL_TABLET | ORAL | Status: DC

## 2014-01-25 MED ORDER — FUROSEMIDE 20 MG PO TABS: ORAL_TABLET | ORAL | Status: DC

## 2014-01-25 MED ORDER — LEVALBUTEROL HCL 1.25 MG/3ML IN NEBU
1.2500 mg | INHALATION_SOLUTION | RESPIRATORY_TRACT | Status: AC
  Administered 2014-01-25: 1.25 mg via RESPIRATORY_TRACT

## 2014-01-25 NOTE — Progress Notes (Signed)
   Subjective:    Patient ID: Jenna [name], female    DOB: 11/25/1935, 78 y.o.   MRN: [id number]  HPI Patient in today c/o SOB- SHe went to the ER Monday night with sob, left neck and shoulder pain- said chest felt tight but nit really pain- They ekg, chest xray and gave her fluids- ALL labs were negative as well as chest xray and EKG. SHe says she still feels tight when she takes a deep breath. SHe was around 2nd hand smoke for years when she was young but has never been a smoker herself.    Review of Systems  Constitutional: Negative for fever, chills and fatigue.  HENT: Negative.   Respiratory: Positive for cough and shortness of breath.   Cardiovascular: Negative.   Gastrointestinal: Negative.   Genitourinary: Negative.   Neurological: Negative.   Psychiatric/Behavioral: Negative.   All other systems reviewed and are negative.      Objective:   Physical Exam  Constitutional: She is oriented to person, place, and time. She appears well-developed and well-nourished.  Pulmonary/Chest: Effort normal and breath sounds normal. She has no wheezes. She has no rales.  Musculoskeletal: She exhibits edema (1+ edema bil ankles).  Neurological: She is alert and oriented to person, place, and time.  Skin: Skin is warm.  Psychiatric: She has a normal mood and affect. Her behavior is normal. Judgment and thought content normal.   BP 120/58  Pulse 65  Temp(Src) 97.7 F (36.5 C) (Oral)  Ht 5\' 4"  (1.626 m)  Wt 212 lb (96.163 kg)  BMI 36.37 kg/m2  SpO2 93%  S/p nebulizer- patient says chest does not feel as tight- breath sounds remain clear        Assessment & Plan:  1. SOB (shortness of breath) rest - levalbuterol (XOPENEX) nebulizer solution 1.25 mg; Take 1.25 mg by nebulization now. - predniSONE (DELTASONE) 20 MG tablet; 2 Tablets PO at the same time daily for 5 days  Dispense: 10 tablet; Refill: 0  2. Peripheral edema Elevate legs when sitting Avoid Na+ in diet -  furosemide (LASIX) 20 MG tablet; 1/2 - 1 PO daily prn  Dispense: 30 tablet; Refill: [employer], FNP

## 2014-01-25 NOTE — Patient Instructions (Signed)
Peripheral Edema °You have swelling in your legs (peripheral edema). This swelling is due to excess accumulation of salt and water in your body. Edema may be a sign of heart, kidney or liver disease, or a side effect of a medication. It may also be due to problems in the leg veins. Elevating your legs and using special support stockings may be very helpful, if the cause of the swelling is due to poor venous circulation. Avoid long periods of standing, whatever the cause. °Treatment of edema depends on identifying the cause. Chips, pretzels, pickles and other salty foods should be avoided. Restricting salt in your diet is almost always needed. Water pills (diuretics) are often used to remove the excess salt and water from your body via urine. These medicines prevent the kidney from reabsorbing sodium. This increases urine flow. °Diuretic treatment may also result in lowering of potassium levels in your body. Potassium supplements may be needed if you have to use diuretics daily. Daily weights can help you keep track of your progress in clearing your edema. You should call your caregiver for follow up care as recommended. °SEEK IMMEDIATE MEDICAL CARE IF:  °· You have increased swelling, pain, redness, or heat in your legs. °· You develop shortness of breath, especially when lying down. °· You develop chest or abdominal pain, weakness, or fainting. °· You have a fever. °Document Released: 06/17/2004 Document Revised: 08/02/2011 Document Reviewed: 05/28/2009 °[employer]® Patient Information ©2015 [employer]. This information is not intended to replace advice given to you by your health care provider. Make sure you discuss any questions you have with your health care provider. ° °

## 2014-02-04 ENCOUNTER — Other Ambulatory Visit: Payer: Self-pay | Admitting: *Deleted

## 2014-02-04 MED ORDER — HYDROCORTISONE 2.5 % RE CREA: 1.0000 "application " | TOPICAL_CREAM | Freq: Every day | RECTAL | Status: DC | PRN

## 2014-02-04 NOTE — Telephone Encounter (Signed)
Request was for proctosol-hc 2.5% cream but for twice daily as needed. Anusol-hc was ordered qd in epic previously. Please review. Last refill on Proctosol-hc 11/23/13.

## 2014-02-05 ENCOUNTER — Ambulatory Visit (INDEPENDENT_AMBULATORY_CARE_PROVIDER_SITE_OTHER): Payer: Medicare Other | Admitting: Cardiology

## 2014-02-05 ENCOUNTER — Encounter: Payer: Self-pay | Admitting: Cardiology

## 2014-02-05 VITALS — BP 138/80 | HR 89 | Ht 65.0 in | Wt 212.0 lb

## 2014-02-05 DIAGNOSIS — R072 Precordial pain: Secondary | ICD-10-CM | POA: Insufficient documentation

## 2014-02-05 DIAGNOSIS — R002 Palpitations: Secondary | ICD-10-CM | POA: Diagnosis not present

## 2014-02-05 DIAGNOSIS — R0602 Shortness of breath: Secondary | ICD-10-CM | POA: Diagnosis not present

## 2014-02-05 NOTE — Patient Instructions (Signed)
Your physician wants you to follow-up in: 6 months You will receive a reminder letter in the mail two months in advance. If you don't receive a letter, please call our office to schedule the follow-up appointment.   Your physician has requested that you have a lexiscan myoview. For further information please visit [email or web address]. Please follow instruction sheet, as given.    Your physician recommends that you continue on your current medications as directed. Please refer to the Current Medication list given to you today.    Thank you for choosing [facility] !

## 2014-02-05 NOTE — Assessment & Plan Note (Signed)
This has been relatively stable recently.

## 2014-02-05 NOTE — Assessment & Plan Note (Signed)
Recent symptoms as outlined above, progressive. Lab work reviewed, no evidence of ACS or CHF, d-dimer also normal. ECG shows nonspecific T-wave abnormalities in the anterior leads that are new. Plan is to proceed with a followup Lexiscan Cardiolite to reassess for ischemia, last evaluation was reassuring 5 years ago.

## 2014-02-05 NOTE — Progress Notes (Signed)
Clinical Summary Jenna [name] is a 78 y.o.female last seen in September 2014. She follows regularly with Dr. Laurance Flatten. Records indicate recent ER evaluation in early September with chest pain. Lab work reveals hemoglobin 13.4, platelets 159, potassium 4.6, BUN 9, creatinine 0.7, normal LFTs, troponin I less than 0.30, pro-BNP 78.7, d-dimer 0.35. ECG showed sinus bradycardia with small R' in lead V1, anterior T-wave inversions (new). CXR reported a patchy lingular infiltrate versus atelectasis.  She describes her symptoms as a chest tightness, the event in question was proceeded by left-sided neck discomfort, then chest pain, and left arm pain, then shortness of breath at rest. She states that she has had these symptoms intermittently for several months.  48 hour Holter monitor from October of last year showed sinus rhythm with PVCs and PACs, brief 4 beat run of atrial tachycardia, no atrial fibrillation.  Lexiscan Cardiolite from July 2010 was negative for ischemia with LVEF 65%. No followup ischemic testing since that time.   Allergies  Allergen Reactions  . Cymbalta [Duloxetine Hcl] Nausea Only  . Daypro [Oxaprozin]   . Diclofenac Sodium   . Effexor [Venlafaxine Hydrochloride]   . Lipitor [Atorvastatin Calcium] Other (See Comments)    Leg aches  . Penicillins   . Prozac [Fluoxetine Hcl]   . Sulfa Antibiotics   . Zocor [Simvastatin] Other (See Comments)    Hip pain  . Amitriptyline Rash  . Milnacipran Hcl Rash  . Savella [Milnacipran Hcl] Rash    Current Outpatient Prescriptions  Medication Sig Dispense Refill  . aspirin EC 81 MG tablet Take 81 mg by mouth daily. Takes 2-3 times weekly      . Cholecalciferol (VITAMIN D3) 2000 UNITS TABS Take 1 tablet by mouth daily.        . colchicine (COLCRYS) 0.6 MG tablet TAKE (1) TABLET BY MOUTH ONCE DAILY as needed for gout flares      . fish oil-omega-3 fatty acids 1000 MG capsule Take 1 g by mouth daily.       . furosemide (LASIX) 20 MG  tablet 1/2 - 1 PO daily prn  30 tablet  3  . hydrocortisone (ANUSOL-HC) 2.5 % rectal cream Place 1 application rectally daily as needed for hemorrhoids or itching.  30 g  1  . levothyroxine (SYNTHROID, LEVOTHROID) 75 MCG tablet TAKE 1 TABLET ONCE DAILY.  30 tablet  11  . meloxicam (MOBIC) 7.5 MG tablet Take 7.5 mg by mouth daily. Takes occasionally      . metoprolol (LOPRESSOR) 50 MG tablet TAKE (1) TABLET BY MOUTH ONCE DAILY.  30 tablet  4   No current facility-administered medications for this visit.    Past Medical History  Diagnosis Date  . OA (osteoarthritis)   . Hypothyroidism   . Fibromyalgia   . Osteoporosis   . PUD (peptic ulcer disease)   . DDD (degenerative disc disease)   . Herpes zoster   . Palpitations   . Cataract   . Hyperlipidemia   . History of skin cancer   . Skin cancer of nose     [name]    Social History Ms. Rappleye reports that she has never smoked. She has never used smokeless tobacco. Ms. Polo reports that she does not drink alcohol.  Review of Systems No recent palpitations, no syncope. Other systems reviewed and negative except as outlined.  Physical Examination Filed Vitals:   02/05/14 0954  BP: 138/80  Pulse: 89   Filed Weights   02/05/14 0954  Weight: 212 lb (96.163 kg)    Overweight woman, appears comfortable at rest.  HEENT: Conjunctiva and lids normal, oropharynx with moist mucosa.  Neck: Supple, no carotid bruits or thyromegaly.  Lungs: Clear to auscultation, nonlabored.  Cardiac: Regular rate and rhythm, no S3.  Extremities: No pitting edema.  Skin: Warm and dry.  Musculoskeletal: No kyphosis.  Neuropsychiatric: Alert and oriented x3, affect appropriate.   Problem List and Plan   Precordial pain Recent symptoms as outlined above, progressive. Lab work reviewed, no evidence of ACS or CHF, d-dimer also normal. ECG shows nonspecific T-wave abnormalities in the anterior leads that are new. Plan is to proceed with a  followup Lexiscan Cardiolite to reassess for ischemia, last evaluation was reassuring 5 years ago.  Palpitations This has been relatively stable recently.    Satira Sark, M.D., F.A.C.C.

## 2014-02-14 ENCOUNTER — Ambulatory Visit (INDEPENDENT_AMBULATORY_CARE_PROVIDER_SITE_OTHER): Payer: Medicare Other | Admitting: Nurse Practitioner

## 2014-02-14 ENCOUNTER — Encounter: Payer: Self-pay | Admitting: Nurse Practitioner

## 2014-02-14 VITALS — BP 168/76 | HR 64 | Temp 97.0°F | Ht 65.0 in | Wt 212.0 lb

## 2014-02-14 DIAGNOSIS — Z124 Encounter for screening for malignant neoplasm of cervix: Secondary | ICD-10-CM

## 2014-02-14 DIAGNOSIS — Z01419 Encounter for gynecological examination (general) (routine) without abnormal findings: Secondary | ICD-10-CM | POA: Diagnosis not present

## 2014-02-14 LAB — POCT UA - MICROSCOPIC ONLY
CASTS, UR, LPF, POC: NEGATIVE
Crystals, Ur, HPF, POC: NEGATIVE
Mucus, UA: NEGATIVE
Yeast, UA: NEGATIVE

## 2014-02-14 LAB — POCT URINALYSIS DIPSTICK
Bilirubin, UA: NEGATIVE
Glucose, UA: NEGATIVE
Ketones, UA: NEGATIVE
LEUKOCYTES UA: NEGATIVE
Nitrite, UA: NEGATIVE
PROTEIN UA: NEGATIVE
Spec Grav, UA: 1.01
UROBILINOGEN UA: NEGATIVE
pH, UA: 6.5

## 2014-02-14 NOTE — Progress Notes (Signed)
   Subjective:    Patient ID: Jenna [name], female    DOB: August 31, 1935, 78 y.o.   MRN: [id number]  HPI Regular patient of Dr. Laurance Flatten that is here today for PAP and breast exam only- SHe has follow up with him in a couple of weeks. She is doing well today without complaints.    Review of Systems  Constitutional: Negative.   HENT: Negative.   Respiratory: Negative.   Cardiovascular: Negative.   Genitourinary: Negative.   Psychiatric/Behavioral: Negative.   All other systems reviewed and are negative.      Objective:   Physical Exam  Constitutional: She is oriented to person, place, and time. She appears well-developed and well-nourished.  HENT:  Head: Normocephalic.  Right Ear: Hearing, tympanic membrane, external ear and ear canal normal.  Left Ear: Hearing, tympanic membrane, external ear and ear canal normal.  Nose: Nose normal.  Mouth/Throat: Uvula is midline and oropharynx is clear and moist.  Eyes: Conjunctivae and EOM are normal. Pupils are equal, round, and reactive to light.  Neck: Normal range of motion and full passive range of motion without pain. Neck supple. No JVD present. Carotid bruit is not present. No mass and no thyromegaly present.  Cardiovascular: Normal rate, normal heart sounds and intact distal pulses.   No murmur heard. Pulmonary/Chest: Effort normal and breath sounds normal. Right breast exhibits no inverted nipple, no mass, no nipple discharge, no skin change and no tenderness. Left breast exhibits no inverted nipple, no mass, no nipple discharge, no skin change and no tenderness.  Abdominal: Soft. Bowel sounds are normal. She exhibits no mass. There is no tenderness.  Genitourinary: Vagina normal and uterus normal. No breast swelling, tenderness, discharge or bleeding.  bimanual exam-No adnexal masses or tenderness. Vaginal cuff intact   Musculoskeletal: Normal range of motion.  Lymphadenopathy:    She has no cervical adenopathy.  Neurological: She  is alert and oriented to person, place, and time.  Skin: Skin is warm and dry.  Psychiatric: She has a normal mood and affect. Her behavior is normal. Judgment and thought content normal.   BP 168/76  Pulse 64  Temp(Src) 97 F (36.1 C) (Oral)  Ht 5\' 5"  (1.651 m)  Wt 212 lb (96.163 kg)  BMI 35.28 kg/m2         Assessment & Plan:  Encounter for gynecological examination - Plan: POCT UA - Microscopic Only, POCT urinalysis dipstick  Keep follow up appointment with Dr. Mayra Neer, FNP

## 2014-02-14 NOTE — Addendum Note (Signed)
Addended by: Selmer Dominion on: 02/14/2014 05:10 PM   Modules accepted: Orders

## 2014-02-14 NOTE — Addendum Note (Signed)
Addended by: Selmer Dominion on: 02/14/2014 05:13 PM   Modules accepted: Orders

## 2014-02-14 NOTE — Patient Instructions (Signed)
Pap Test A Pap test is a procedure done in a clinic office to evaluate cells that are on the surface of the cervix. The cervix is the lower portion of the uterus and upper portion of the vagina. For some women, the cervical region has the potential to form cancer. With consistent evaluations by your caregiver, this type of cancer can be prevented.  If a Pap test is abnormal, it is most often a result of a previous exposure to human papillomavirus (HPV). HPV is a virus that can infect the cells of the cervix and cause dysplasia. Dysplasia is where the cells no longer look normal. If a woman has been diagnosed with high-grade or severe dysplasia, they are at higher risk of developing cervical cancer. People diagnosed with low-grade dysplasia should still be seen by their caregiver because there is a small chance that low-grade dysplasia could develop into cancer.  LET YOUR CAREGIVER KNOW ABOUT:  Recent sexually transmitted infection (STI) you have had.  Any new sex partners you have had.  History of previous abnormal Pap tests results.  History of previous cervical procedures you have had (colposcopy, biopsy, loop electrosurgical excision procedure [LEEP]).  Concerns you have had regarding unusual vaginal discharge.  History of pelvic pain.  Your use of birth control. BEFORE THE PROCEDURE  Ask your caregiver when to schedule your Pap test. It is best not to be on your period if your caregiver uses a wooden spatula to collect cells or applies cells to a glass slide. Newer techniques are not so sensitive to the timing of a menstrual cycle.  Do not douche or have sexual intercourse for 24 hours before the test.   Do not use vaginal creams or tampons for 24 hours before the test.   Empty your bladder just before the test to lessen any discomfort.  PROCEDURE You will lie on an exam table with your feet in stirrups. A warm metal or plastic instrument (speculum) is placed in your vagina. This  instrument allows your caregiver to see the inside of your vagina and look at your cervix. A small, plastic brush or wooden spatula is then used to collect cervical cells. These cells are placed in a lab specimen container. The cells are looked at under a microscope. A specialist will determine if the cells are normal.  AFTER THE PROCEDURE Make sure to get your test results.If your results come back abnormal, you may need further testing.  Document Released: 07/31/2002 Document Revised: 08/02/2011 Document Reviewed: 05/06/2011 ExitCare Patient Information 2015 [employer]. This information is not intended to replace advice given to you by your health care provider. Make sure you discuss any questions you have with your health care provider.  

## 2014-02-15 ENCOUNTER — Encounter (HOSPITAL_COMMUNITY)
Admission: RE | Admit: 2014-02-15 | Discharge: 2014-02-15 | Disposition: A | Discharge status: Home / Self Care | Payer: Medicare Other | Source: Ambulatory Visit | Attending: Cardiology | Admitting: Cardiology

## 2014-02-15 ENCOUNTER — Ambulatory Visit (HOSPITAL_COMMUNITY)
Admission: RE | Admit: 2014-02-15 | Discharge: 2014-02-15 | Disposition: A | Discharge status: Home / Self Care | Payer: Medicare Other | Source: Ambulatory Visit | Attending: Cardiology | Admitting: Cardiology

## 2014-02-15 ENCOUNTER — Encounter (HOSPITAL_COMMUNITY): Payer: Self-pay

## 2014-02-15 DIAGNOSIS — R0602 Shortness of breath: Secondary | ICD-10-CM | POA: Insufficient documentation

## 2014-02-15 DIAGNOSIS — E785 Hyperlipidemia, unspecified: Secondary | ICD-10-CM | POA: Insufficient documentation

## 2014-02-15 DIAGNOSIS — R079 Chest pain, unspecified: Secondary | ICD-10-CM | POA: Diagnosis not present

## 2014-02-15 DIAGNOSIS — R0789 Other chest pain: Secondary | ICD-10-CM | POA: Diagnosis not present

## 2014-02-15 DIAGNOSIS — R072 Precordial pain: Secondary | ICD-10-CM | POA: Insufficient documentation

## 2014-02-15 HISTORY — DX: Essential (primary) hypertension: I10

## 2014-02-15 MED ORDER — TECHNETIUM TC 99M SESTAMIBI GENERIC - CARDIOLITE
30.0000 | Freq: Once | INTRAVENOUS | Status: AC | PRN
  Administered 2014-02-15: 30 via INTRAVENOUS

## 2014-02-15 MED ORDER — SODIUM CHLORIDE 0.9 % IJ SOLN
INTRAMUSCULAR | Status: AC
  Administered 2014-02-15: 10 mL via INTRAVENOUS
  Filled 2014-02-15: qty 10

## 2014-02-15 MED ORDER — TECHNETIUM TC 99M SESTAMIBI - CARDIOLITE: 30.0000 | Freq: Once | INTRAVENOUS | Status: DC | PRN

## 2014-02-15 MED ORDER — REGADENOSON 0.4 MG/5ML IV SOLN
0.4000 mg | Freq: Once | INTRAVENOUS | Status: AC
  Administered 2014-02-15: 0.4 mg via INTRAVENOUS

## 2014-02-15 MED ORDER — REGADENOSON 0.4 MG/5ML IV SOLN
INTRAVENOUS | Status: AC
  Administered 2014-02-15: 0.4 mg via INTRAVENOUS
  Filled 2014-02-15: qty 5

## 2014-02-15 MED ORDER — SODIUM CHLORIDE 0.9 % IJ SOLN
10.0000 mL | INTRAMUSCULAR | Status: DC | PRN
  Administered 2014-02-15: 10 mL via INTRAVENOUS

## 2014-02-15 MED ORDER — TECHNETIUM TC 99M SESTAMIBI GENERIC - CARDIOLITE
10.0000 | Freq: Once | INTRAVENOUS | Status: AC | PRN
  Administered 2014-02-15: 10 via INTRAVENOUS

## 2014-02-15 NOTE — Progress Notes (Signed)
  Stress Lab Nurses Notes - [facility]  Jenna [name] 02/15/2014 Reason for doing test: Chest Pain, SOB  Type of test: Leane Call Nurse performing test: Boneta Lucks Nuclear Medicine Tech: Melburn Hake Echo Tech: None MD performing test: Jory Sims Family MD: Dr Redge Gainer Test explained and consent signed: Yes.   IV started: Saline lock flushed and Saline lock started in radiology Symptoms: Pressure in Abdomen Treatment/Intervention: None Reason test stopped: protocol completed After recovery IV was: Discontinued via X-ray tech Patient to return to [facility]. Med at :11:50 Patient discharged: Home Patient's Condition upon discharge was: stable Comments: Pretest BP 146/74  HR 59, During test Peak BP 146/70 HR 86, Symptoms resolved, pt eating and drinking Jenna [name], Jenna [name]

## 2014-02-16 LAB — PAP IG W/ RFLX HPV ASCU: PAP SMEAR COMMENT: 0

## 2014-02-26 ENCOUNTER — Other Ambulatory Visit: Payer: Self-pay | Admitting: Family Medicine

## 2014-02-26 ENCOUNTER — Ambulatory Visit (INDEPENDENT_AMBULATORY_CARE_PROVIDER_SITE_OTHER): Payer: Medicare Other | Admitting: Family Medicine

## 2014-02-26 ENCOUNTER — Encounter: Payer: Self-pay | Admitting: Family Medicine

## 2014-02-26 ENCOUNTER — Ambulatory Visit (INDEPENDENT_AMBULATORY_CARE_PROVIDER_SITE_OTHER): Payer: Medicare Other

## 2014-02-26 VITALS — BP 137/67 | HR 72 | Temp 98.0°F | Ht 65.0 in | Wt 211.0 lb

## 2014-02-26 DIAGNOSIS — E785 Hyperlipidemia, unspecified: Secondary | ICD-10-CM | POA: Diagnosis not present

## 2014-02-26 DIAGNOSIS — M542 Cervicalgia: Secondary | ICD-10-CM

## 2014-02-26 DIAGNOSIS — E559 Vitamin D deficiency, unspecified: Secondary | ICD-10-CM

## 2014-02-26 DIAGNOSIS — E8881 Metabolic syndrome: Secondary | ICD-10-CM

## 2014-02-26 DIAGNOSIS — M25562 Pain in left knee: Secondary | ICD-10-CM

## 2014-02-26 LAB — POCT CBC
Granulocyte percent: 69.8 %G (ref 37–80)
HCT, POC: 39.2 % (ref 37.7–47.9)
Hemoglobin: 12.8 g/dL (ref 12.2–16.2)
LYMPH, POC: 1.4 (ref 0.6–3.4)
MCH, POC: 31.1 pg (ref 27–31.2)
MCHC: 32.7 g/dL (ref 31.8–35.4)
MCV: 95.2 fL (ref 80–97)
MPV: 8 fL (ref 0–99.8)
PLATELET COUNT, POC: 191 10*3/uL (ref 142–424)
POC Granulocyte: 3.8 (ref 2–6.9)
POC LYMPH %: 25.7 % (ref 10–50)
RBC: 4.1 M/uL (ref 4.04–5.48)
RDW, POC: 13.6 %
WBC: 5.4 10*3/uL (ref 4.6–10.2)

## 2014-02-26 NOTE — Patient Instructions (Addendum)
Medicare Annual Wellness Visit  [facility] and the medical providers at [facility] strive to bring you the best medical care.  In doing so we not only want to address your current medical conditions and concerns but also to detect new conditions early and prevent illness, disease and health-related problems.    Medicare offers a yearly Wellness Visit which allows our clinical staff to assess your need for preventative services including immunizations, lifestyle education, counseling to decrease risk of preventable diseases and screening for fall risk and other medical concerns.    This visit is provided free of charge (no copay) for all Medicare recipients. The clinical pharmacists at [facility] have begun to conduct these Wellness Visits which will also include a thorough review of all your medications.    As you primary medical provider recommend that you make an appointment for your Annual Wellness Visit if you have not done so already this year.  You may set up this appointment before you leave today or you may call back ([phone]) and schedule an appointment.  Please make sure when you call that you mention that you are scheduling your Annual Wellness Visit with the clinical pharmacist so that the appointment may be made for the proper length of time.     Continue current medications. Continue good therapeutic lifestyle changes which include good diet and exercise. Fall precautions discussed with patient. If an FOBT was given today- please return it to our front desk. If you are over 67 years old - you may need Prevnar 65 or the adult Pneumonia vaccine.  Flu Shots will be available at our office starting mid- September. Please call and schedule a FLU CLINIC APPOINTMENT.   Be sure and call us if you are exposed to the flu and we can call some medication in for you to take to prevent you from getting the flu Take Aleve once  daily after eating for neck pain and knee pain--- watch stomach and if you develop pain in the stomach you should stop taking the medication. We will call you with the results of the C-spine films when those results are available

## 2014-02-26 NOTE — Progress Notes (Signed)
Subjective:    Patient ID: Jenna [name], female    DOB: October 26, 1935, 78 y.o.   MRN: [id number]  HPI Pt here for follow up and management of chronic medical problems. The patient's main complaint today is some neck pain radiating to the left shoulder. The patient will be given FOBT to return and she will also receive lab work today. She had a recent stress test and cardiology visit which was within normal limits. The patient indicates that she was not informed of the results of her Pap smear and pelvic exam. We will attempt to get that information for her.        Patient Active Problem List   Diagnosis Date Noted  . Precordial pain 02/05/2014  . Palpitations 02/05/2014  . History of shingles 01/11/2014  . Pre-diabetes 11/21/2013  . Obesity (BMI 30.0-34.9) 11/21/2013  . Mixed hypercholesterolemia and hypertriglyceridemia 11/21/2013  . Metabolic syndrome 96/28/3662  . Multiple drug allergies 10/23/2013  . Statin intolerance 10/23/2013  . Osteopenia of the elderly 07/25/2013  . Vitamin D deficiency 06/19/2013  . Unspecified constipation 04/23/2013  . Rectal bleeding 04/23/2013  . Gout 02/21/2013  . Osteoarthritis 02/21/2013  . Fibromyalgia   . Hyperlipidemia 11/14/2008   Outpatient Encounter Prescriptions as of 02/26/2014  Medication Sig  . aspirin EC 81 MG tablet Take 81 mg by mouth daily. Takes 2-3 times weekly  . Cholecalciferol (VITAMIN D3) 2000 UNITS TABS Take 1 tablet by mouth daily.    . colchicine (COLCRYS) 0.6 MG tablet TAKE (1) TABLET BY MOUTH ONCE DAILY as needed for gout flares  . fish oil-omega-3 fatty acids 1000 MG capsule Take 1 g by mouth daily.   . furosemide (LASIX) 20 MG tablet 1/2 - 1 PO daily prn  . hydrocortisone (ANUSOL-HC) 2.5 % rectal cream Place 1 application rectally daily as needed for hemorrhoids or itching.  . levothyroxine (SYNTHROID, LEVOTHROID) 75 MCG tablet TAKE 1 TABLET ONCE DAILY.  . meloxicam (MOBIC) 7.5 MG tablet Take 7.5 mg by mouth  daily. Takes occasionally  . metoprolol (LOPRESSOR) 50 MG tablet TAKE (1) TABLET BY MOUTH ONCE DAILY.    Review of Systems  Constitutional: Negative.   HENT: Negative.   Eyes: Negative.   Respiratory: Negative.   Cardiovascular: Negative.   Gastrointestinal: Negative.   Endocrine: Negative.   Genitourinary: Negative.   Musculoskeletal: Positive for neck pain (left side and into left shoulder).  Skin: Negative.   Allergic/Immunologic: Negative.   Neurological: Negative.   Hematological: Negative.   Psychiatric/Behavioral: Negative.        Objective:   Physical Exam  Nursing note and vitals reviewed. Constitutional: She is oriented to person, place, and time. She appears well-developed and well-nourished. No distress.  The patient is alert and looks much longer than her stated age 50  HENT:  Head: Normocephalic and atraumatic.  Right Ear: External ear normal.  Left Ear: External ear normal.  Nose: Nose normal.  Mouth/Throat: Oropharynx is clear and moist.  Eyes: Conjunctivae and EOM are normal. Pupils are equal, round, and reactive to light. Right eye exhibits no discharge. Left eye exhibits no discharge. No scleral icterus.  Neck: Normal range of motion. Neck supple. No thyromegaly present.  No carotid bruits  Cardiovascular: Normal rate, regular rhythm, normal heart sounds and intact distal pulses.  Exam reveals no gallop and no friction rub.   No murmur heard. At 72 per minute, the rhythm is regular  Pulmonary/Chest: Effort normal and breath sounds normal. No respiratory distress. She  has no wheezes. She has no rales. She exhibits no tenderness.  Abdominal: Soft. Bowel sounds are normal. She exhibits no mass. There is no tenderness. There is no rebound and no guarding.  Musculoskeletal: Normal range of motion. She exhibits no edema and no tenderness.  There is no pitting edema on either lower extremity. The left knee has medial joint line tenderness. There is discomfort in  the left neck.  Lymphadenopathy:    She has no cervical adenopathy.  Neurological: She is alert and oriented to person, place, and time. She has normal reflexes. No cranial nerve deficit.  Skin: Skin is warm and dry. No rash noted.  Psychiatric: She has a normal mood and affect. Her behavior is normal. Judgment and thought content normal.   BP 137/67  Pulse 72  Temp(Src) 98 F (36.7 C) (Oral)  Ht _0  (1.651 m)  Wt 211 lb (95.709 kg)  BMI 35.11 kg/m2  [facility] reading (PRIMARY) by  Dr. Laurance Flatten  --- cervical spine--  degenerative changes                                                                 Assessment & Plan:   1. Hyperlipidemia - POCT CBC - BMP8+EGFR - Hepatic function panel - NMR, lipoprofile  2. Metabolic syndrome - POCT CBC  3. Vitamin D deficiency - POCT CBC - Vit D  25 hydroxy (rtn osteoporosis monitoring)  4. Neck pain on left side -C-spine  5. Left knee pain  No orders of the defined types were placed in this encounter.   Patient Instructions                       Medicare Annual Wellness Visit  [facility] and the medical providers at [facility] strive to bring you the best medical care.  In doing so we not only want to address your current medical conditions and concerns but also to detect new conditions early and prevent illness, disease and health-related problems.    Medicare offers a yearly Wellness Visit which allows our clinical staff to assess your need for preventative services including immunizations, lifestyle education, counseling to decrease risk of preventable diseases and screening for fall risk and other medical concerns.    This visit is provided free of charge (no copay) for all Medicare recipients. The clinical pharmacists at [facility] have begun to conduct these Wellness Visits which will also include a thorough review of all your medications.    As you primary medical provider  recommend that you make an appointment for your Annual Wellness Visit if you have not done so already this year.  You may set up this appointment before you leave today or you may call back ([phone]) and schedule an appointment.  Please make sure when you call that you mention that you are scheduling your Annual Wellness Visit with the clinical pharmacist so that the appointment may be made for the proper length of time.     Continue current medications. Continue good therapeutic lifestyle changes which include good diet and exercise. Fall precautions discussed with patient. If an FOBT was given today- please return it to our front desk. If you are over 57 years old - you  may need Prevnar 13 or the adult Pneumonia vaccine.  Flu Shots will be available at our office starting mid- September. Please call and schedule a FLU CLINIC APPOINTMENT.   Be sure and call us if you are exposed to the flu and we can call some medication in for you to take to prevent you from getting the flu Take Aleve once daily after eating for neck pain and knee pain--- watch stomach and if you develop pain in the stomach you should stop taking the medication. We will call you with the results of the C-spine films when those results are available   Arrie Senate MD

## 2014-02-27 ENCOUNTER — Telehealth: Payer: Self-pay | Admitting: *Deleted

## 2014-02-27 ENCOUNTER — Telehealth: Payer: Self-pay

## 2014-02-27 LAB — NMR, LIPOPROFILE
CHOLESTEROL: 226 mg/dL — AB (ref 100–199)
HDL CHOLESTEROL BY NMR: 47 mg/dL (ref >39)
HDL Particle Number: 33.8 umol/L (ref >=30.5)
LDL Particle Number: 1936 nmol/L — ABNORMAL HIGH (ref <1000)
LDL Size: 20.5 nm (ref >20.5)
LDLC SERPL CALC-MCNC: 138 mg/dL — AB (ref 0–99)
LP-IR SCORE: 65 — AB (ref <=45)
Small LDL Particle Number: 1027 nmol/L — ABNORMAL HIGH (ref <=527)
Triglycerides by NMR: 206 mg/dL — ABNORMAL HIGH (ref 0–149)

## 2014-02-27 LAB — HEPATIC FUNCTION PANEL
ALBUMIN: 4.2 g/dL (ref 3.5–4.8)
ALT: 14 IU/L (ref 0–32)
AST: 15 IU/L (ref 0–40)
Alkaline Phosphatase: 91 IU/L (ref 39–117)
Bilirubin, Direct: 0.13 mg/dL (ref 0.00–0.40)
TOTAL PROTEIN: 6 g/dL (ref 6.0–8.5)
Total Bilirubin: 0.7 mg/dL (ref 0.0–1.2)

## 2014-02-27 LAB — BMP8+EGFR
BUN/Creatinine Ratio: 16 (ref 11–26)
BUN: 12 mg/dL (ref 8–27)
CALCIUM: 9.4 mg/dL (ref 8.7–10.3)
CHLORIDE: 99 mmol/L (ref 97–108)
CO2: 26 mmol/L (ref 18–29)
Creatinine, Ser: 0.74 mg/dL (ref 0.57–1.00)
GFR calc Af Amer: 91 mL/min/{1.73_m2} (ref >59)
GFR calc non Af Amer: 79 mL/min/{1.73_m2} (ref >59)
GLUCOSE: 101 mg/dL — AB (ref 65–99)
Potassium: 4.6 mmol/L (ref 3.5–5.2)
Sodium: 140 mmol/L (ref 134–144)

## 2014-02-27 LAB — VITAMIN D 25 HYDROXY (VIT D DEFICIENCY, FRACTURES): Vit D, 25-Hydroxy: 43.5 ng/mL (ref 30.0–100.0)

## 2014-02-27 NOTE — Telephone Encounter (Signed)
Message copied by Koren Bound on Wed Feb 27, 2014  8:11 AM ------      Message from: Chipper Herb      Created: Tue Feb 26, 2014  5:26 PM       As per radiology report ------

## 2014-02-27 NOTE — Telephone Encounter (Signed)
Pt aware of Cervical Spine x-ray results

## 2014-02-27 NOTE — Telephone Encounter (Signed)
Aware. 

## 2014-02-27 NOTE — Telephone Encounter (Signed)
Message copied by Shelbie Ammons on Wed Feb 27, 2014 11:00 AM ------      Message from: Chipper Herb      Created: Wed Feb 27, 2014  8:38 AM       The blood sugar is slightly elevated at 101. This is consistent with past readings. The creatinine, the most important kidney function test is within normal limits. The electrolytes including potassium are within normal limits.      All liver function tests are within normal      Cholesterol numbers with advanced lipid testing remain elevated. The total LDL particle numbers 1936. The LDL C. is elevated at 138. The triglycerides are very elevated at 206. The good cholesterol the HDL particle number is good.------- the patient must try to do better with her diet and exercise regimen.      The vitamin D level is good, continue current treatment ------

## 2014-02-28 ENCOUNTER — Other Ambulatory Visit: Payer: Medicare Other

## 2014-02-28 DIAGNOSIS — Z1212 Encounter for screening for malignant neoplasm of rectum: Secondary | ICD-10-CM | POA: Diagnosis not present

## 2014-02-28 NOTE — Progress Notes (Signed)
Lab only 

## 2014-03-01 LAB — FECAL OCCULT BLOOD, IMMUNOCHEMICAL: Fecal Occult Bld: NEGATIVE

## 2014-03-27 DIAGNOSIS — L570 Actinic keratosis: Secondary | ICD-10-CM | POA: Diagnosis not present

## 2014-03-27 DIAGNOSIS — D2262 Melanocytic nevi of left upper limb, including shoulder: Secondary | ICD-10-CM | POA: Diagnosis not present

## 2014-03-27 DIAGNOSIS — D485 Neoplasm of uncertain behavior of skin: Secondary | ICD-10-CM | POA: Diagnosis not present

## 2014-03-27 DIAGNOSIS — Z85828 Personal history of other malignant neoplasm of skin: Secondary | ICD-10-CM | POA: Diagnosis not present

## 2014-05-18 ENCOUNTER — Other Ambulatory Visit: Payer: Self-pay | Admitting: Family Medicine

## 2014-05-18 ENCOUNTER — Other Ambulatory Visit: Payer: Self-pay | Admitting: Nurse Practitioner

## 2014-07-09 ENCOUNTER — Ambulatory Visit: Payer: Medicare Other | Admitting: Family Medicine

## 2014-07-15 ENCOUNTER — Ambulatory Visit (INDEPENDENT_AMBULATORY_CARE_PROVIDER_SITE_OTHER): Payer: Medicare Other | Admitting: Family Medicine

## 2014-07-15 ENCOUNTER — Encounter: Payer: Self-pay | Admitting: Family Medicine

## 2014-07-15 VITALS — BP 158/71 | HR 65 | Temp 97.8°F | Ht 65.0 in | Wt 215.0 lb

## 2014-07-15 DIAGNOSIS — E785 Hyperlipidemia, unspecified: Secondary | ICD-10-CM

## 2014-07-15 DIAGNOSIS — M7989 Other specified soft tissue disorders: Secondary | ICD-10-CM

## 2014-07-15 DIAGNOSIS — E039 Hypothyroidism, unspecified: Secondary | ICD-10-CM

## 2014-07-15 DIAGNOSIS — E559 Vitamin D deficiency, unspecified: Secondary | ICD-10-CM | POA: Diagnosis not present

## 2014-07-15 DIAGNOSIS — R0602 Shortness of breath: Secondary | ICD-10-CM

## 2014-07-15 DIAGNOSIS — E8881 Metabolic syndrome: Secondary | ICD-10-CM

## 2014-07-15 LAB — POCT CBC
Granulocyte percent: 70.6 %G (ref 37–80)
HCT, POC: 40.8 % (ref 37.7–47.9)
Hemoglobin: 13.3 g/dL (ref 12.2–16.2)
Lymph, poc: 1.3 (ref 0.6–3.4)
MCH: 31.4 pg — AB (ref 27–31.2)
MCHC: 32.5 g/dL (ref 31.8–35.4)
MCV: 96.5 fL (ref 80–97)
MPV: 8.1 fL (ref 0–99.8)
POC GRANULOCYTE: 4.3 (ref 2–6.9)
POC LYMPH %: 21.4 % (ref 10–50)
Platelet Count, POC: 180 10*3/uL (ref 142–424)
RBC: 4.23 M/uL (ref 4.04–5.48)
RDW, POC: 12.6 %
WBC: 6.1 10*3/uL (ref 4.6–10.2)

## 2014-07-15 NOTE — Progress Notes (Signed)
Subjective:    Patient ID: Jenna [name], female    DOB: November 21, 1935, 79 y.o.   MRN: [id number]  HPI Pt here for follow up and management of chronic medical problems which includes hypothyroid. She is taking medications regularly. The patient is followed by Dr. Domenic Polite, the cardiologist and is do an appointment with him. She has a history of atrial fibrillation. She has some problems with her feet swelling more than usual and has had some shortness of breath at nighttime.         Patient Active Problem List   Diagnosis Date Noted  . Precordial pain 02/05/2014  . Palpitations 02/05/2014  . History of shingles 01/11/2014  . Pre-diabetes 11/21/2013  . Obesity (BMI 30.0-34.9) 11/21/2013  . Mixed hypercholesterolemia and hypertriglyceridemia 11/21/2013  . Metabolic syndrome 06/00/4599  . Multiple drug allergies 10/23/2013  . Statin intolerance 10/23/2013  . Osteopenia of the elderly 07/25/2013  . Vitamin D deficiency 06/19/2013  . Unspecified constipation 04/23/2013  . Rectal bleeding 04/23/2013  . Gout 02/21/2013  . Osteoarthritis 02/21/2013  . Fibromyalgia   . Hyperlipidemia 11/14/2008   Outpatient Encounter Prescriptions as of 07/15/2014  Medication Sig  . aspirin EC 81 MG tablet Take 81 mg by mouth daily. Takes 2-3 times weekly  . Cholecalciferol (VITAMIN D3) 2000 UNITS TABS Take 1 tablet by mouth daily.    . colchicine (COLCRYS) 0.6 MG tablet TAKE (1) TABLET BY MOUTH ONCE DAILY as needed for gout flares  . fish oil-omega-3 fatty acids 1000 MG capsule Take 1 g by mouth daily.   . furosemide (LASIX) 20 MG tablet 1/2 - 1 PO daily prn  . levothyroxine (SYNTHROID, LEVOTHROID) 75 MCG tablet TAKE 1 TABLET ONCE DAILY.  . meloxicam (MOBIC) 7.5 MG tablet Take 7.5 mg by mouth daily. Takes occasionally  . metoprolol (LOPRESSOR) 50 MG tablet TAKE (1) TABLET BY MOUTH ONCE DAILY.  [name] PROCTOSOL HC 2.5 % rectal cream APPLY RECTALLY DAILY AS NEEDED FOR HEMORRHOIDS OR ITCHING.     Review of Systems  Constitutional: Negative.   HENT: Negative.   Eyes: Negative.   Respiratory: Positive for shortness of breath (at night - occasionally).   Cardiovascular: Positive for leg swelling.  Gastrointestinal: Negative.   Endocrine: Negative.   Genitourinary: Negative.   Musculoskeletal: Negative.   Skin: Negative.   Allergic/Immunologic: Negative.   Neurological: Negative.   Hematological: Negative.   Psychiatric/Behavioral: Negative.        Objective:   Physical Exam  Constitutional: She is oriented to person, place, and time. She appears well-developed and well-nourished.  HENT:  Head: Normocephalic and atraumatic.  Right Ear: External ear normal.  Left Ear: External ear normal.  Nose: Nose normal.  Mouth/Throat: Oropharynx is clear and moist. No oropharyngeal exudate.  Eyes: Conjunctivae and EOM are normal. Pupils are equal, round, and reactive to light. Right eye exhibits no discharge. Left eye exhibits no discharge. No scleral icterus.  Neck: Normal range of motion. Neck supple. No thyromegaly present.  Without bruits or thyromegaly or adenopathy  Cardiovascular: Normal rate, regular rhythm, normal heart sounds and intact distal pulses.   No murmur heard. At 72/m  Pulmonary/Chest: Effort normal and breath sounds normal. No respiratory distress. She has no wheezes. She has no rales. She exhibits no tenderness.  Abdominal: Soft. Bowel sounds are normal. She exhibits no mass. There is no tenderness. There is no rebound and no guarding.  The abdomen is obese without masses or tenderness  Musculoskeletal: Normal range of motion. She  exhibits edema. She exhibits no tenderness.  There is 1+ pretibial edema.  Lymphadenopathy:    She has no cervical adenopathy.  Neurological: She is alert and oriented to person, place, and time. She has normal reflexes. No cranial nerve deficit.  Skin: Skin is warm and dry. No rash noted.  Psychiatric: She has a normal mood and  affect. Her behavior is normal. Judgment and thought content normal.  Nursing note and vitals reviewed.  BP 158/71 mmHg  Pulse 65  Temp(Src) 97.8 F (36.6 C) (Oral)  Ht _0  (1.651 m)  Wt 215 lb (97.523 kg)  BMI 35.78 kg/m2        Assessment & Plan:  1. Hyperlipidemia -The patient is statin intolerant and she should continue with as aggressive therapeutic lifestyle changes as possible - POCT CBC - BMP8+EGFR - Hepatic function panel - NMR, lipoprofile  2. Metabolic syndrome -Continue regular exercise and watching diet as closely as possible - POCT CBC - BMP8+EGFR  3. Vitamin D deficiency -Continue current treatment pending results of lab work. She should continue with vitamin D3 2000 daily. - POCT CBC - Vit D  25 hydroxy (rtn osteoporosis monitoring)  4. Hypothyroidism, unspecified hypothyroidism type -Patient has no symptoms regarding her hypothyroidism and her thyroid levels will be checked to make sure that no changes necessary with her medication. - POCT CBC - Thyroid Panel With TSH  5. SOB (shortness of breath) -The patient should continue to watch her sodium intake and she should take her Lasix 20 mg more regularly. She should do this at least daily for the next 2 weeks and come by and get follow-up lab work and weight as directed -The patient needs to get an appointment with her cardiologist as this is new. - Brain natriuretic peptide  6. Leg swelling -Take Lasix as directed and get weight and BMP in 2 weeks. - Brain natriuretic peptide  No orders of the defined types were placed in this encounter.   Patient Instructions                       Medicare Annual Wellness Visit  [facility] and the medical providers at [facility] strive to bring you the best medical care.  In doing so we not only want to address your current medical conditions and concerns but also to detect new conditions early and prevent illness, disease and  health-related problems.    Medicare offers a yearly Wellness Visit which allows our clinical staff to assess your need for preventative services including immunizations, lifestyle education, counseling to decrease risk of preventable diseases and screening for fall risk and other medical concerns.    This visit is provided free of charge (no copay) for all Medicare recipients. The clinical pharmacists at [facility] have begun to conduct these Wellness Visits which will also include a thorough review of all your medications.    As you primary medical provider recommend that you make an appointment for your Annual Wellness Visit if you have not done so already this year.  You may set up this appointment before you leave today or you may call back ([phone]) and schedule an appointment.  Please make sure when you call that you mention that you are scheduling your Annual Wellness Visit with the clinical pharmacist so that the appointment may be made for the proper length of time.     Continue current medications. Continue good therapeutic lifestyle changes which include  good diet and exercise. Fall precautions discussed with patient. If an FOBT was given today- please return it to our front desk. If you are over 65 years old - you may need Prevnar 63 or the adult Pneumonia vaccine.  Flu Shots are still available at our office. If you still haven't had one please call to set up a nurse visit to get one.   After your visit with [place] today you will receive a survey in the mail or online from [employer] regarding your care with [place]. Please take a moment to fill this out. Your feedback is very important to [place] as you can help [place] better understand your patient needs as well as improve your experience and satisfaction. WE CARE ABOUT YOU!!!   Take Lasix 20 mg 1 daily for at least 2 weeks Watch sodium intake Return to the office in a couple weeks and get a BMP, a weight, and let the  nurse know how your breathing is doing. Make sure that you call and get a visit with the cardiologist Please do not forget to get your eye exam scheduled   Arrie Senate MD

## 2014-07-15 NOTE — Addendum Note (Signed)
Addended by: Earlene Plater on: 07/15/2014 11:45 AM   Modules accepted: Miquel Dunn

## 2014-07-15 NOTE — Patient Instructions (Addendum)
Medicare Annual Wellness Visit  [facility] and the medical providers at [facility] strive to bring you the best medical care.  In doing so we not only want to address your current medical conditions and concerns but also to detect new conditions early and prevent illness, disease and health-related problems.    Medicare offers a yearly Wellness Visit which allows our clinical staff to assess your need for preventative services including immunizations, lifestyle education, counseling to decrease risk of preventable diseases and screening for fall risk and other medical concerns.    This visit is provided free of charge (no copay) for all Medicare recipients. The clinical pharmacists at [facility] have begun to conduct these Wellness Visits which will also include a thorough review of all your medications.    As you primary medical provider recommend that you make an appointment for your Annual Wellness Visit if you have not done so already this year.  You may set up this appointment before you leave today or you may call back ([phone]) and schedule an appointment.  Please make sure when you call that you mention that you are scheduling your Annual Wellness Visit with the clinical pharmacist so that the appointment may be made for the proper length of time.     Continue current medications. Continue good therapeutic lifestyle changes which include good diet and exercise. Fall precautions discussed with patient. If an FOBT was given today- please return it to our front desk. If you are over 37 years old - you may need Prevnar 51 or the adult Pneumonia vaccine.  Flu Shots are still available at our office. If you still haven't had one please call to set up a nurse visit to get one.   After your visit with [place] today you will receive a survey in the mail or online from [employer] regarding your care with [place]. Please take a moment to  fill this out. Your feedback is very important to [place] as you can help [place] better understand your patient needs as well as improve your experience and satisfaction. WE CARE ABOUT YOU!!!   Take Lasix 20 mg 1 daily for at least 2 weeks Watch sodium intake Return to the office in a couple weeks and get a BMP, a weight, and let the nurse know how your breathing is doing. Make sure that you call and get a visit with the cardiologist Please do not forget to get your eye exam scheduled

## 2014-07-16 ENCOUNTER — Encounter: Payer: Self-pay | Admitting: Family Medicine

## 2014-07-16 LAB — BMP8+EGFR
BUN/Creatinine Ratio: 18 (ref 11–26)
BUN: 11 mg/dL (ref 8–27)
CALCIUM: 9.2 mg/dL (ref 8.7–10.3)
CO2: 27 mmol/L (ref 18–29)
Chloride: 98 mmol/L (ref 97–108)
Creatinine, Ser: 0.62 mg/dL (ref 0.57–1.00)
GFR calc Af Amer: 101 mL/min/{1.73_m2} (ref >59)
GFR, EST NON AFRICAN AMERICAN: 88 mL/min/{1.73_m2} (ref >59)
GLUCOSE: 95 mg/dL (ref 65–99)
Potassium: 4.5 mmol/L (ref 3.5–5.2)
Sodium: 140 mmol/L (ref 134–144)

## 2014-07-16 LAB — HEPATIC FUNCTION PANEL
ALBUMIN: 4.4 g/dL (ref 3.5–4.8)
ALK PHOS: 95 IU/L (ref 39–117)
ALT: 13 IU/L (ref 0–32)
AST: 15 IU/L (ref 0–40)
Bilirubin Total: 0.7 mg/dL (ref 0.0–1.2)
Bilirubin, Direct: 0.16 mg/dL (ref 0.00–0.40)
Total Protein: 6.2 g/dL (ref 6.0–8.5)

## 2014-07-16 LAB — THYROID PANEL WITH TSH
FREE THYROXINE INDEX: 3.1 (ref 1.2–4.9)
T3 UPTAKE RATIO: 26 % (ref 24–39)
T4 TOTAL: 12 ug/dL (ref 4.5–12.0)
TSH: 2.24 u[IU]/mL (ref 0.450–4.500)

## 2014-07-16 LAB — NMR, LIPOPROFILE
Cholesterol: 241 mg/dL — ABNORMAL HIGH (ref 100–199)
HDL Cholesterol by NMR: 62 mg/dL (ref >39)
HDL Particle Number: 38.7 umol/L (ref >=30.5)
LDL Particle Number: 1792 nmol/L — ABNORMAL HIGH (ref <1000)
LDL Size: 20.7 nm (ref >20.5)
LDL-C: 145 mg/dL — AB (ref 0–99)
LP-IR SCORE: 53 — AB (ref <=45)
Small LDL Particle Number: 783 nmol/L — ABNORMAL HIGH (ref <=527)
Triglycerides by NMR: 169 mg/dL — ABNORMAL HIGH (ref 0–149)

## 2014-07-16 LAB — BRAIN NATRIURETIC PEPTIDE: BNP: 38.6 pg/mL (ref 0.0–100.0)

## 2014-07-16 LAB — VITAMIN D 25 HYDROXY (VIT D DEFICIENCY, FRACTURES): VIT D 25 HYDROXY: 38.7 ng/mL (ref 30.0–100.0)

## 2014-07-29 ENCOUNTER — Telehealth: Payer: Self-pay | Admitting: Family Medicine

## 2014-07-29 MED ORDER — AZITHROMYCIN 250 MG PO TABS: ORAL_TABLET | ORAL | Status: DC

## 2014-07-29 NOTE — Telephone Encounter (Signed)
Pt aware and med ordered 

## 2014-07-29 NOTE — Telephone Encounter (Signed)
Please call a prescription in for a Z-Pak and take as directed Also have the patient take Mucinex maximum strength, blue and white in color, 1 twice daily for cough and congestion with a large glass of water Use nasal saline frequently through the day and drink plenty of fluids

## 2014-08-15 ENCOUNTER — Encounter: Payer: Self-pay | Admitting: Cardiology

## 2014-08-15 ENCOUNTER — Ambulatory Visit (INDEPENDENT_AMBULATORY_CARE_PROVIDER_SITE_OTHER): Payer: Medicare Other | Admitting: Cardiology

## 2014-08-15 VITALS — BP 142/72 | HR 67 | Ht 65.0 in | Wt 219.0 lb

## 2014-08-15 DIAGNOSIS — R0602 Shortness of breath: Secondary | ICD-10-CM

## 2014-08-15 DIAGNOSIS — R002 Palpitations: Secondary | ICD-10-CM | POA: Diagnosis not present

## 2014-08-15 DIAGNOSIS — R600 Localized edema: Secondary | ICD-10-CM

## 2014-08-15 NOTE — Patient Instructions (Signed)
Your physician wants you to follow-up in: 6 months You will receive a reminder letter in the mail two months in advance. If you don't receive a letter, please call our office to schedule the follow-up appointment.      Your physician recommends that you continue on your current medications as directed. Please refer to the Current Medication list given to you today.     Your physician has requested that you have an echocardiogram. Echocardiography is a painless test that uses sound waves to create images of your heart. It provides your doctor with information about the size and shape of your heart and how well your heart's chambers and valves are working. This procedure takes approximately one hour. There are no restrictions for this procedure.      Thank you for choosing [facility] !    

## 2014-08-15 NOTE — Progress Notes (Signed)
Cardiology Office Note  Date: 08/15/2014   ID: Jenna, [name] 01/15/1936, MRN [id number]  PCP: Redge Gainer, MD  Primary Cardiologist: Rozann Lesches, MD   Chief Complaint  Patient presents with  . Cardiac follow-up    History of Present Illness: Jenna [name] is a 79 y.o. female last seen in September 2015. Subsequent office visits with Dr. Laurance Flatten noted, most recently in February. She reports having some trouble with shortness of breath, describes more of a difficulty taking a full breath the necessarily exertional shortness of breath all the time. She has also had leg edema and skin irritation around her ankles bilaterally. She was placed on Lasix daily by Dr. Laurance Flatten, and is starting to have some improvement in symptoms. Recent lab work is outlined below including normal BNP.   She has no clearly documented history of cardiomyopathy or obstructive CAD. Last ischemic workup in September 2015 was reassuring.  She has a long-standing history of intermittent palpitations, but no clear diagnosis of atrial fibrillation. She reports no change in the symptoms. She continues on beta blocker therapy.   Past Medical History  Diagnosis Date  . OA (osteoarthritis)   . Hypothyroidism   . Fibromyalgia   . Osteoporosis   . PUD (peptic ulcer disease)   . DDD (degenerative disc disease)   . Herpes zoster   . Palpitations   . Cataract   . Hyperlipidemia   . History of skin cancer   . Hypertension   . Skin cancer of nose     [name]    Past Surgical History  Procedure Laterality Date  . Cesarean section    . Breast biopsy      Right  . Cataract extraction w/phaco  10/28/2011    Procedure: CATARACT EXTRACTION PHACO AND INTRAOCULAR LENS PLACEMENT (IOC);  Surgeon: Tonny Branch, MD;  Location: [facility];  Service: Ophthalmology;  Laterality: Right;  CDE 18.38  . Cataract extraction w/phaco  02/14/2012    Procedure: CATARACT EXTRACTION PHACO AND INTRAOCULAR LENS PLACEMENT (IOC);   Surgeon: Tonny Branch, MD;  Location: [facility];  Service: Ophthalmology;  Laterality: Left;  CDE 17.20  . Colonoscopy N/A 05/31/2013    Procedure: COLONOSCOPY;  Surgeon: Rogene Houston, MD;  Location: [facility];  Service: Endoscopy;  Laterality: N/A;  240  . Eye surgery    . Abdominal hysterectomy      partial  . Fracture surgery  1952    fractured right arm    Current Outpatient Prescriptions  Medication Sig Dispense Refill  . aspirin EC 81 MG tablet Take 81 mg by mouth daily. Takes 2-3 times weekly    . Cholecalciferol (VITAMIN D3) 2000 UNITS TABS Take 1 tablet by mouth daily.      . colchicine (COLCRYS) 0.6 MG tablet TAKE (1) TABLET BY MOUTH ONCE DAILY as needed for gout flares    . fish oil-omega-3 fatty acids 1000 MG capsule Take 1 g by mouth daily.     [name] levothyroxine (SYNTHROID, LEVOTHROID) 75 MCG tablet TAKE 1 TABLET ONCE DAILY. 30 tablet 11  . metoprolol (LOPRESSOR) 50 MG tablet TAKE (1) TABLET BY MOUTH ONCE DAILY. 30 tablet 3  . naproxen sodium (ANAPROX) 220 MG tablet Take 220 mg by mouth as needed.    [name] PROCTOSOL HC 2.5 % rectal cream APPLY RECTALLY DAILY AS NEEDED FOR HEMORRHOIDS OR ITCHING. 28.35 g 0  . furosemide (LASIX) 20 MG tablet 1/2 - 1 PO daily prn (Patient not taking: Reported  on 08/15/2014) 30 tablet 3   No current facility-administered medications for this visit.    Allergies:  Cymbalta; Daypro; Diclofenac sodium; Effexor; Lipitor; Penicillins; Prozac; Sulfa antibiotics; Zocor; Amitriptyline; Milnacipran hcl; and Savella   Social History: The patient  reports that she has never smoked. She has never used smokeless tobacco. She reports that she does not drink alcohol or use illicit drugs.   ROS:  Please see the history of present illness. Otherwise, complete review of systems is positive for none.  All other systems are reviewed and negative.   Physical Exam: VS:  BP 142/72 mmHg  Pulse 67  Ht 5\' 5"  (1.651 m)  Wt 219 lb (99.338 kg)  BMI 36.44 kg/m2  SpO2 97%,  BMI Body mass index is 36.44 kg/(m^2).  Wt Readings from Last 3 Encounters:  08/15/14 219 lb (99.338 kg)  07/15/14 215 lb (97.523 kg)  02/26/14 211 lb (95.709 kg)     General: Patient appears comfortable at rest. HEENT: Conjunctiva and lids normal, oropharynx clear with moist mucosa. Neck: Supple, no elevated JVP or carotid bruits, no thyromegaly. Lungs: Clear to auscultation, nonlabored breathing at rest. Cardiac: Regular rate and rhythm, no S3 or significant systolic murmur, no pericardial rub. Abdomen: Soft, nontender, no hepatomegaly, bowel sounds present, no guarding or rebound. Extremities: Mild edema involving the lower legs and ankles with associated stasis and skin irritation/rash. Skin: Warm and dry. Musculoskeletal: No kyphosis. Neuropsychiatric: Alert and oriented x3, affect grossly appropriate.   ECG: ECG is not ordered today.  Recent Labwork: 01/21/2014: Platelets 159; Pro B Natriuretic peptide (BNP) 78.7 07/15/2014: ALT 13; AST 15; B Natriuretic Peptide 38.6; BUN 11; Creatinine 0.62; Hemoglobin 13.3; Potassium 4.5; Sodium 140; TSH 2.240     Component Value Date/Time   CHOL 241* 07/15/2014 1133   TRIG 169* 07/15/2014 1133   TRIG 113 10/16/2008 0540   HDL 62 07/15/2014 1133   HDL 48 10/16/2008 0540   CHOLHDL 3.6 10/16/2008 0540   VLDL 23 10/16/2008 0540   LDLCALC 138* 02/26/2014 1001   LDLCALC * 10/16/2008 0540    103        Total Cholesterol/HDL:CHD Risk Coronary Heart Disease Risk Table                     Men   Women  1/2 Average Risk   3.4   3.3  Average Risk       5.0   4.4  2 X Average Risk   9.6   7.1  3 X Average Risk  23.4   11.0        Use the calculated Patient Ratio above and the CHD Risk Table to determine the patient's CHD Risk.        ATP III CLASSIFICATION (LDL):  <100     mg/dL   Optimal  100-129  mg/dL   Near or Above                    Optimal  130-159  mg/dL   Borderline  160-189  mg/dL   High  >190     mg/dL   Very High     Other Studies Reviewed Today:  Lexiscan Cardiolite 02/15/2014: TECHNIQUE: Standard myocardial SPECT imaging was performed after resting intravenous injection of 10 mCi Tc-41m sestamibi. Subsequently, intravenous infusion of Lexiscan was performed under the supervision of the Cardiology staff. At peak effect of the drug, 30 mCi Tc-26m sestamibi was injected intravenously and standard myocardial SPECT imaging  was performed. Quantitative gated imaging was also performed to evaluate left ventricular wall motion, and estimate left ventricular ejection fraction.  COMPARISON: None.  FINDINGS: ECG/stress data: The patient was stressed according to the Lexiscan protocol. The heart rate ranged from 59-86 beats per min. The blood pressure average 146/74. No chest pain was reported.  Baseline tracing demonstrated normal sinus rhythm with a nonspecific T-wave abnormality and leads V2 and V3. With stressed there noted arrhythmias nor any diagnostic ischaemic ST segment changes.  Perfusion: No decreased activity in the left ventricle on stress imaging to suggest reversible ischemia or infarction. Inferior and inferoseptal wall soft tissue attenuation artifact noted.  Wall Motion: Normal left ventricular wall motion. No left ventricular dilation.  Left Ventricular Ejection Fraction: 55 %  End diastolic volume 81 ml  End systolic volume 37 ml  IMPRESSION: 1. No reversible ischemia or infarction.  2. Normal left ventricular wall motion.  3. Left ventricular ejection fraction 55%  4. Low-risk stress test findings*.   ASSESSMENT AND PLAN:  1. Shortness of breath and leg edema. Agree with addition of Lasix. She does look to have a component of venous stasis, we will however obtain an echocardiogram to reassess cardiac structure and function.  2. Recent ischemic workup reassuring with normal Cardiolite in September 2015.  3. Long-standing history of intermittent  palpitations without firm diagnosis of cardiac arrhythmia, specifically no atrial fibrillation by cardiac monitoring. She continues on beta blocker.  Current medicines were reviewed at length with the patient today.   Orders Placed This Encounter  Procedures  . 2D Echocardiogram with contrast    Disposition: FU with me in 6 months.   Signed, Satira Sark, MD, [facility] 08/15/2014 10:44 AM    [facility]. [address] [place], [place] [address] Phone: [phone]; Fax: [phone]

## 2014-08-20 ENCOUNTER — Other Ambulatory Visit (HOSPITAL_COMMUNITY): Payer: Medicare Other

## 2014-08-20 ENCOUNTER — Ambulatory Visit (HOSPITAL_COMMUNITY)
Admission: RE | Admit: 2014-08-20 | Discharge: 2014-08-20 | Disposition: A | Discharge status: Home / Self Care | Payer: Medicare Other | Source: Ambulatory Visit | Attending: Cardiology | Admitting: Cardiology

## 2014-08-20 DIAGNOSIS — R0602 Shortness of breath: Secondary | ICD-10-CM | POA: Insufficient documentation

## 2014-08-20 NOTE — Progress Notes (Signed)
  Echocardiogram 2D Echocardiogram has been performed.  Jenna [name] 08/20/2014, 2:37 PM

## 2014-09-26 DIAGNOSIS — Z961 Presence of intraocular lens: Secondary | ICD-10-CM | POA: Diagnosis not present

## 2014-10-07 ENCOUNTER — Encounter: Payer: Self-pay | Admitting: Family Medicine

## 2014-10-07 ENCOUNTER — Ambulatory Visit (INDEPENDENT_AMBULATORY_CARE_PROVIDER_SITE_OTHER): Payer: Medicare Other | Admitting: Family Medicine

## 2014-10-07 VITALS — BP 125/63 | HR 61 | Temp 98.1°F | Ht 65.0 in | Wt 219.0 lb

## 2014-10-07 DIAGNOSIS — M25562 Pain in left knee: Secondary | ICD-10-CM | POA: Diagnosis not present

## 2014-10-07 NOTE — Progress Notes (Signed)
Subjective:    Patient ID: Jenna [name], female    DOB: 1935/11/15, 79 y.o.   MRN: [id number]  HPI Patient here today left leg pain. She fell last Monday when her left leg gave out while getting in a car. The pertinent history is that she was lifting the right leg to get in the vehicle and standing on the left leg it gave way and she fell back on her buttocks. She was doing fairly well until last night and then coming back from church she got to the point where she could not walk at all or bear any weight. There is minimal swelling but there is limited range of motion and most of the pain appears to be in the popliteal area and in the lateral and medial joint line of the knee area.        Patient Active Problem List   Diagnosis Date Noted  . Precordial pain 02/05/2014  . Palpitations 02/05/2014  . History of shingles 01/11/2014  . Pre-diabetes 11/21/2013  . Obesity (BMI 30.0-34.9) 11/21/2013  . Mixed hypercholesterolemia and hypertriglyceridemia 11/21/2013  . Metabolic syndrome 68/03/5725  . Multiple drug allergies 10/23/2013  . Statin intolerance 10/23/2013  . Osteopenia of the elderly 07/25/2013  . Vitamin D deficiency 06/19/2013  . Unspecified constipation 04/23/2013  . Rectal bleeding 04/23/2013  . Gout 02/21/2013  . Osteoarthritis 02/21/2013  . Fibromyalgia   . Hyperlipidemia 11/14/2008   Outpatient Encounter Prescriptions as of 10/07/2014  Medication Sig  . aspirin EC 81 MG tablet Take 81 mg by mouth daily. Takes 2-3 times weekly  . Cholecalciferol (VITAMIN D3) 2000 UNITS TABS Take 1 tablet by mouth daily.    . colchicine (COLCRYS) 0.6 MG tablet TAKE (1) TABLET BY MOUTH ONCE DAILY as needed for gout flares  . fish oil-omega-3 fatty acids 1000 MG capsule Take 1 g by mouth daily.   . furosemide (LASIX) 20 MG tablet 1/2 - 1 PO daily prn  . levothyroxine (SYNTHROID, LEVOTHROID) 75 MCG tablet TAKE 1 TABLET ONCE DAILY.  . metoprolol (LOPRESSOR) 50 MG tablet TAKE (1)  TABLET BY MOUTH ONCE DAILY.  . naproxen sodium (ANAPROX) 220 MG tablet Take 220 mg by mouth as needed.  [name] PROCTOSOL HC 2.5 % rectal cream APPLY RECTALLY DAILY AS NEEDED FOR HEMORRHOIDS OR ITCHING.   No facility-administered encounter medications on file as of 10/07/2014.     Review of Systems  Constitutional: Negative.   HENT: Negative.   Eyes: Negative.   Respiratory: Negative.   Cardiovascular: Negative.   Gastrointestinal: Negative.   Endocrine: Negative.   Genitourinary: Negative.   Musculoskeletal: Positive for arthralgias (left behind the knee pain).  Skin: Negative.   Allergic/Immunologic: Negative.   Neurological: Negative.   Hematological: Negative.   Psychiatric/Behavioral: Negative.        Objective:   Physical Exam  Constitutional: She is oriented to person, place, and time. She appears well-developed and well-nourished. No distress.  Musculoskeletal: She exhibits tenderness. She exhibits no edema.  Limited range of motion of left knee with bilateral joint line and popliteal space tenderness. When fully extending the knee there is pain and there is also pain with palpation in the popliteal space.  Neurological: She is alert and oriented to person, place, and time.  Skin: Skin is warm and dry. No rash noted. No erythema.  Psychiatric: She has a normal mood and affect. Her behavior is normal. Judgment and thought content normal.  Vitals reviewed.  BP 125/63 mmHg  Pulse  61  Temp(Src) 98.1 F (36.7 C) (Oral)  Ht 5\' 5"  (6.063 m)  Wt 016 lb (99.338 kg)  BMI 36.44 kg/m2        Assessment & Plan:  1. Knee pain, acute, left - DG Knee 1-2 Views Left; Future -The patient should take extra strength Tylenol tonight and use crutches -We will get x-rays of the knee and possibly an MRI tomorrow -She should get assistance at home to help her with her movements until we can get further evaluation  Patient Instructions  The patient will use crutches tonight and do no  weightbearing. She will return to the clinic in the morning for x-rays of the left knee   She should take Tylenol as needed for pain. She has assistance to help her at home with getting in the house.      Arrie Senate MD   .

## 2014-10-07 NOTE — Patient Instructions (Addendum)
The patient will use crutches tonight and do no weightbearing. She will return to the clinic in the morning for x-rays of the left knee   She should take Tylenol as needed for pain. She has assistance to help her at home with getting in the house.

## 2014-10-08 ENCOUNTER — Other Ambulatory Visit: Payer: Self-pay

## 2014-10-08 ENCOUNTER — Ambulatory Visit (HOSPITAL_COMMUNITY)
Admission: RE | Admit: 2014-10-08 | Discharge: 2014-10-08 | Disposition: A | Discharge status: Home / Self Care | Payer: Medicare Other | Source: Ambulatory Visit | Attending: Family Medicine | Admitting: Family Medicine

## 2014-10-08 ENCOUNTER — Ambulatory Visit (INDEPENDENT_AMBULATORY_CARE_PROVIDER_SITE_OTHER): Payer: Medicare Other

## 2014-10-08 DIAGNOSIS — M1712 Unilateral primary osteoarthritis, left knee: Secondary | ICD-10-CM | POA: Diagnosis not present

## 2014-10-08 DIAGNOSIS — M25562 Pain in left knee: Secondary | ICD-10-CM | POA: Diagnosis not present

## 2014-10-08 DIAGNOSIS — S8992XA Unspecified injury of left lower leg, initial encounter: Secondary | ICD-10-CM | POA: Diagnosis not present

## 2014-10-08 DIAGNOSIS — W19XXXA Unspecified fall, initial encounter: Secondary | ICD-10-CM | POA: Insufficient documentation

## 2014-10-08 NOTE — Addendum Note (Signed)
Addended by: Zannie Cove on: 10/08/2014 09:40 AM   Modules accepted: Orders

## 2014-10-10 DIAGNOSIS — G8929 Other chronic pain: Secondary | ICD-10-CM | POA: Diagnosis not present

## 2014-10-10 DIAGNOSIS — M25562 Pain in left knee: Secondary | ICD-10-CM | POA: Diagnosis not present

## 2014-10-14 ENCOUNTER — Other Ambulatory Visit: Payer: Self-pay | Admitting: Family Medicine

## 2014-10-17 ENCOUNTER — Other Ambulatory Visit (HOSPITAL_COMMUNITY): Payer: Medicare Other

## 2014-11-13 ENCOUNTER — Other Ambulatory Visit: Payer: Self-pay | Admitting: Family Medicine

## 2014-12-02 ENCOUNTER — Ambulatory Visit (INDEPENDENT_AMBULATORY_CARE_PROVIDER_SITE_OTHER): Payer: Medicare Other

## 2014-12-02 ENCOUNTER — Ambulatory Visit (INDEPENDENT_AMBULATORY_CARE_PROVIDER_SITE_OTHER): Payer: Medicare Other | Admitting: Family Medicine

## 2014-12-02 ENCOUNTER — Encounter: Payer: Self-pay | Admitting: Family Medicine

## 2014-12-02 VITALS — BP 141/62 | HR 65 | Temp 97.4°F | Ht 65.0 in | Wt 213.0 lb

## 2014-12-02 DIAGNOSIS — R7309 Other abnormal glucose: Secondary | ICD-10-CM

## 2014-12-02 DIAGNOSIS — E669 Obesity, unspecified: Secondary | ICD-10-CM | POA: Diagnosis not present

## 2014-12-02 DIAGNOSIS — M1990 Unspecified osteoarthritis, unspecified site: Secondary | ICD-10-CM

## 2014-12-02 DIAGNOSIS — M25552 Pain in left hip: Secondary | ICD-10-CM | POA: Diagnosis not present

## 2014-12-02 DIAGNOSIS — M797 Fibromyalgia: Secondary | ICD-10-CM

## 2014-12-02 DIAGNOSIS — R7303 Prediabetes: Secondary | ICD-10-CM

## 2014-12-02 DIAGNOSIS — E1159 Type 2 diabetes mellitus with other circulatory complications: Secondary | ICD-10-CM | POA: Insufficient documentation

## 2014-12-02 DIAGNOSIS — E559 Vitamin D deficiency, unspecified: Secondary | ICD-10-CM | POA: Diagnosis not present

## 2014-12-02 DIAGNOSIS — Z780 Asymptomatic menopausal state: Secondary | ICD-10-CM

## 2014-12-02 DIAGNOSIS — E8881 Metabolic syndrome: Secondary | ICD-10-CM

## 2014-12-02 DIAGNOSIS — I10 Essential (primary) hypertension: Secondary | ICD-10-CM

## 2014-12-02 DIAGNOSIS — M899 Disorder of bone, unspecified: Secondary | ICD-10-CM

## 2014-12-02 DIAGNOSIS — R5382 Chronic fatigue, unspecified: Secondary | ICD-10-CM

## 2014-12-02 DIAGNOSIS — E785 Hyperlipidemia, unspecified: Secondary | ICD-10-CM | POA: Diagnosis not present

## 2014-12-02 DIAGNOSIS — M25562 Pain in left knee: Secondary | ICD-10-CM | POA: Diagnosis not present

## 2014-12-02 DIAGNOSIS — M8580 Other specified disorders of bone density and structure, unspecified site: Secondary | ICD-10-CM

## 2014-12-02 LAB — POCT CBC
GRANULOCYTE PERCENT: 66.6 % (ref 37–80)
HEMATOCRIT: 39 % (ref 37.7–47.9)
Hemoglobin: 13 g/dL (ref 12.2–16.2)
Lymph, poc: 1.4 (ref 0.6–3.4)
MCH, POC: 30.7 pg (ref 27–31.2)
MCHC: 33.3 g/dL (ref 31.8–35.4)
MCV: 92.2 fL (ref 80–97)
MPV: 7.6 fL (ref 0–99.8)
POC Granulocyte: 3.7 (ref 2–6.9)
POC LYMPH %: 25.5 % (ref 10–50)
Platelet Count, POC: 167 10*3/uL (ref 142–424)
RBC: 4.23 M/uL (ref 4.04–5.48)
RDW, POC: 12.9 %
WBC: 5.6 10*3/uL (ref 4.6–10.2)

## 2014-12-02 LAB — LIPID PANEL: LDL CALC: 133 mg/dL

## 2014-12-02 NOTE — Progress Notes (Signed)
Subjective:    Patient ID: Jenna [name], female    DOB: 18-Aug-1935, 79 y.o.   MRN: [id number]  HPI 79 year old female comes in today to follow up on chronic medical conditions which include hypertension, hyperlipidemia, pre-diabetes, and vitamin D deficiency. The patient is pleasant and alert. She is getting better from her fall back in May when she landed on her buttocks and developed knee pain and left hip pain. The MRI of the knee indicated she had osteoarthritis but no acute injury or fracture. She did see the orthopedic surgeon who recommended exercises. She still having trouble with the knee and some with the left hip. She has not had any more falls. She is taking extra strength Tylenol for pain. She denies chest pain shortness of breath trouble swallowing and heartburn indigestion nausea vomiting diarrhea or blood in the stool. She is also passing her water without problems. She continues to take extra strength Tylenol for pain and does not take Naprosyn. She says the pain is mostly worse in the morning when she gets out of the bed and turns over and gets up. This is in the hip and the knee.    Patient Active Problem List   Diagnosis Date Noted  . Hypertension   . Precordial pain 02/05/2014  . Palpitations 02/05/2014  . History of shingles 01/11/2014  . Pre-diabetes 11/21/2013  . Obesity (BMI 30.0-34.9) 11/21/2013  . Mixed hypercholesterolemia and hypertriglyceridemia 11/21/2013  . Metabolic syndrome 83/41/9622  . Multiple drug allergies 10/23/2013  . Statin intolerance 10/23/2013  . Osteopenia of the elderly 07/25/2013  . Vitamin D deficiency 06/19/2013  . Unspecified constipation 04/23/2013  . Rectal bleeding 04/23/2013  . Gout 02/21/2013  . Osteoarthritis 02/21/2013  . Fibromyalgia   . Hyperlipidemia 11/14/2008   Outpatient Encounter Prescriptions as of 12/02/2014  Medication Sig  . aspirin EC 81 MG tablet Take 81 mg by mouth daily. Takes 2-3 times weekly  .  Cholecalciferol (VITAMIN D3) 2000 UNITS TABS Take 1 tablet by mouth daily.    . colchicine (COLCRYS) 0.6 MG tablet TAKE (1) TABLET BY MOUTH ONCE DAILY as needed for gout flares  . fish oil-omega-3 fatty acids 1000 MG capsule Take 1 g by mouth daily.   . furosemide (LASIX) 20 MG tablet 1/2 - 1 PO daily prn  . levothyroxine (SYNTHROID, LEVOTHROID) 75 MCG tablet TAKE (1) TABLET BY MOUTH ONCE DAILY.  . metoprolol (LOPRESSOR) 50 MG tablet TAKE (1) TABLET BY MOUTH ONCE DAILY.  [name] PROCTOSOL HC 2.5 % rectal cream APPLY RECTALLY DAILY AS NEEDED FOR HEMORRHOIDS OR ITCHING.  . [DISCONTINUED] naproxen sodium (ANAPROX) 220 MG tablet Take 220 mg by mouth as needed.   No facility-administered encounter medications on file as of 12/02/2014.      Review of Systems  Constitutional: Positive for fatigue (since fall 1 month ago).  HENT: Negative.   Eyes: Negative.   Respiratory: Positive for shortness of breath (Episodic-last episode was 2-3 weeks ago. Can't relate it to anything). Negative for cough and chest tightness.   Cardiovascular: Positive for leg swelling (left leg). Negative for chest pain and palpitations.  Gastrointestinal: Negative.   Endocrine: Negative.   Genitourinary: Negative.   Musculoskeletal: Positive for arthralgias (Left knee and left hip pain and weakness).  Skin: Negative.   Allergic/Immunologic: Negative.   Neurological: Negative.   Hematological: Negative.   Psychiatric/Behavioral: Positive for sleep disturbance.       Problems going to sleep and staying asleep  Objective:   Physical Exam  Constitutional: She is oriented to person, place, and time. She appears well-developed and well-nourished. No distress.  HENT:  Head: Normocephalic and atraumatic.  Right Ear: External ear normal.  Left Ear: External ear normal.  Nose: Nose normal.  Mouth/Throat: Oropharynx is clear and moist.  Eyes: Conjunctivae and EOM are normal. Pupils are equal, round, and reactive to  light. Right eye exhibits no discharge. Left eye exhibits no discharge. No scleral icterus.  Neck: Normal range of motion. Neck supple. No thyromegaly present.  Without bruits thyroid or adenopathy  Cardiovascular: Normal rate, regular rhythm, normal heart sounds and intact distal pulses.   No murmur heard. At 60/m  Pulmonary/Chest: Effort normal and breath sounds normal. No respiratory distress. She has no wheezes. She has no rales. She exhibits no tenderness.  Abdominal: Soft. Bowel sounds are normal. She exhibits no mass. There is no tenderness. There is no rebound and no guarding.  Musculoskeletal: Normal range of motion. She exhibits no edema or tenderness.  The patient was able to get on the exam table get down and did not appear to have any major problems with her knee or her hip during the visit. She was tender posteriorly in the left hip. There was no knee tenderness with fairly good mobility.  Lymphadenopathy:    She has no cervical adenopathy.  Neurological: She is alert and oriented to person, place, and time. She has normal reflexes. No cranial nerve deficit.  Skin: Skin is warm and dry. No rash noted.  Psychiatric: She has a normal mood and affect. Her behavior is normal. Judgment and thought content normal.  Nursing note and vitals reviewed.   BP 141/62 mmHg  Pulse 65  Temp(Src) 97.4 F (36.3 C) (Oral)  Ht 5' 5" (1.651 m)  Wt 213 lb (96.616 kg)  BMI 35.44 kg/m2  [facility] reading (PRIMARY) by  Dr. Louretta Parma hip--  degenerative changes                                   Assessment & Plan:  1. Hyperlipidemia -Continue current treatment pending results of lab work - Hepatic function panel - NMR, lipoprofile  2. Osteoarthritis, unspecified osteoarthritis type, unspecified site -Continue exercise strength Tylenol and walking exercises and range of motion exercises for knee and hip.  3. Vitamin D deficiency -Continue vitamin D replacement pending results of lab work -  Vit D  25 hydroxy (rtn osteoporosis monitoring)  4. Pre-diabetes -Continue aggressive therapeutic lifestyle changes and weight loss efforts - POCT CBC - BMP8+EGFR  5. Osteopenia of the elderly -Continue to be careful and not put yourself at risk for falling - Vit D  25 hydroxy (rtn osteoporosis monitoring)  6. Obesity (BMI 30.0-34.9) -The patient needs to continue with an aggressive exercise regimen and lose weight  7. Metabolic syndrome -Exercise regimen and weight loss recommended  8. Fibromyalgia -The patient has no specific complaints with this today other than her left hip pain left knee pain and fatigue  9. Essential hypertension -The blood pressure is slightly elevated but we will make no changes in her treatment  10. Knee pain, acute, left -Patient had a left knee MRI and this basically showed osteoarthritis. She saw the orthopedic surgeon who recommended exercises and therapy and she is doing somewhat better with this.  11. Hip pain, left -He has continued to have hip pain and has some posterior tenderness. She  will continue to take Tylenol pending results of x-ray report - DG HIP UNILAT WITH PELVIS 2-3 VIEWS LEFT; Future  12. Chronic fatigue -She is on thyroid replacement cause of this complaint we will make sure that her thyroid medication is at the appropriate dose. - Thyroid Panel With TSH  Patient Instructions  Continue all meds Continue Lifestyle modification- diet and exercise Fall precautions discussed Keep follow up appointments with any specialists  Remember to get your flu shot beginning in late September through the fall season. Continue to walk and exercise regularly but move slowly Take Tylenol as needed for pain We'll call you with the x-ray results of the hip once these results become available    Arrie Senate MD

## 2014-12-02 NOTE — Addendum Note (Signed)
Addended by: Ilean China on: 12/02/2014 04:56 PM   Modules accepted: Orders

## 2014-12-02 NOTE — Addendum Note (Signed)
Addended by: Ilean China on: 12/02/2014 11:44 AM   Modules accepted: Orders

## 2014-12-02 NOTE — Patient Instructions (Signed)
Continue all meds Continue Lifestyle modification- diet and exercise Fall precautions discussed Keep follow up appointments with any specialists  Remember to get your flu shot beginning in late September through the fall season. Continue to walk and exercise regularly but move slowly Take Tylenol as needed for pain We'll call you with the x-ray results of the hip once these results become available

## 2014-12-03 ENCOUNTER — Telehealth: Payer: Self-pay | Admitting: *Deleted

## 2014-12-03 LAB — NMR, LIPOPROFILE
Cholesterol: 218 mg/dL — ABNORMAL HIGH (ref 100–199)
HDL CHOLESTEROL BY NMR: 56 mg/dL (ref >39)
HDL Particle Number: 33.1 umol/L (ref >=30.5)
LDL PARTICLE NUMBER: 1638 nmol/L — AB (ref <1000)
LDL Size: 21.1 nm (ref >20.5)
LDL-C: 133 mg/dL — ABNORMAL HIGH (ref 0–99)
LP-IR Score: 53 — ABNORMAL HIGH (ref <=45)
SMALL LDL PARTICLE NUMBER: 442 nmol/L (ref <=527)
Triglycerides by NMR: 145 mg/dL (ref 0–149)

## 2014-12-03 LAB — BMP8+EGFR
BUN/Creatinine Ratio: 21 (ref 11–26)
BUN: 13 mg/dL (ref 8–27)
CALCIUM: 9.4 mg/dL (ref 8.7–10.3)
CHLORIDE: 98 mmol/L (ref 97–108)
CO2: 27 mmol/L (ref 18–29)
Creatinine, Ser: 0.62 mg/dL (ref 0.57–1.00)
GFR calc Af Amer: 101 mL/min/{1.73_m2} (ref >59)
GFR calc non Af Amer: 87 mL/min/{1.73_m2} (ref >59)
Glucose: 102 mg/dL — ABNORMAL HIGH (ref 65–99)
POTASSIUM: 4.5 mmol/L (ref 3.5–5.2)
Sodium: 142 mmol/L (ref 134–144)

## 2014-12-03 LAB — HEPATIC FUNCTION PANEL
ALT: 13 IU/L (ref 0–32)
AST: 12 IU/L (ref 0–40)
Albumin: 4.1 g/dL (ref 3.5–4.8)
Alkaline Phosphatase: 100 IU/L (ref 39–117)
BILIRUBIN TOTAL: 0.6 mg/dL (ref 0.0–1.2)
Bilirubin, Direct: 0.16 mg/dL (ref 0.00–0.40)
TOTAL PROTEIN: 6.1 g/dL (ref 6.0–8.5)

## 2014-12-03 LAB — THYROID PANEL WITH TSH
FREE THYROXINE INDEX: 3.3 (ref 1.2–4.9)
T3 Uptake Ratio: 29 % (ref 24–39)
T4, Total: 11.5 ug/dL (ref 4.5–12.0)
TSH: 1.69 u[IU]/mL (ref 0.450–4.500)

## 2014-12-03 LAB — VITAMIN D 25 HYDROXY (VIT D DEFICIENCY, FRACTURES): VIT D 25 HYDROXY: 44.2 ng/mL (ref 30.0–100.0)

## 2014-12-03 NOTE — Telephone Encounter (Signed)
-----   Message from Chipper Herb, MD sent at 12/03/2014  7:50 AM EDT ----- Blood sugar slightly elevated at 102. The creatinine, the most important kidney function test is within normal limits the electrolytes including potassium are good. The vitamin D level is good and the patient should continue with her current vitamin D treatment. All liver function tests are within normal limits Cholesterol numbers with advanced lipid testing have a total LDL particle number that remains elevated but slightly decreased from 4 months ago and is now 1638. LDL C is elevated at 133. The triglycerides are good at 145. Patient is statin intolerant. She must continued with as aggressive therapeutic lifestyle changes as possible which include diet and exercise for cholesterol numbers need to be much lower. Please have her reemphasize attention to watching her cholesterol and getting more exercise. All thyroid function tests are within normal limits

## 2014-12-03 NOTE — Telephone Encounter (Signed)
lmtcb regarding test results. 

## 2014-12-04 ENCOUNTER — Telehealth: Payer: Self-pay | Admitting: Family Medicine

## 2014-12-04 NOTE — Telephone Encounter (Signed)
Patient aware of the results and verbalizes understanding.

## 2014-12-06 ENCOUNTER — Other Ambulatory Visit: Payer: Self-pay | Admitting: Nurse Practitioner

## 2014-12-16 ENCOUNTER — Ambulatory Visit: Payer: Medicare Other | Admitting: Family Medicine

## 2015-01-30 ENCOUNTER — Ambulatory Visit (INDEPENDENT_AMBULATORY_CARE_PROVIDER_SITE_OTHER): Payer: Medicare Other | Admitting: Family Medicine

## 2015-01-30 ENCOUNTER — Encounter: Payer: Self-pay | Admitting: Family Medicine

## 2015-01-30 VITALS — BP 132/71 | HR 98 | Temp 97.6°F | Ht 65.0 in | Wt 193.6 lb

## 2015-01-30 DIAGNOSIS — M25561 Pain in right knee: Secondary | ICD-10-CM | POA: Insufficient documentation

## 2015-01-30 MED ORDER — TRAMADOL HCL 50 MG PO TABS: 50.0000 mg | ORAL_TABLET | Freq: Three times a day (TID) | ORAL | Status: DC | PRN

## 2015-01-30 NOTE — Progress Notes (Signed)
   HPI  Patient presents today to discuss right knee pain  She states that she's had right knee pain for3 months. She states that it started suddenly one day while she was walking up stairs. She did not have any twisting or turning or fall or trauma at that time.  She describes medial and anterior knee painworse with standing or walking and activity.She denies any swelling of the knee.She also denies any falls or injury of the knee since this has started. She has no locking or popping symptoms.  PMH: Smoking status noted ROS: Per HPI  Objective: BP 132/71 mmHg  Pulse 98  Temp(Src) 97.6 F (36.4 C) (Oral)  Ht 5\' 5"  (1.651 m)  Wt 193 lb 9.6 oz (87.816 kg)  BMI 32.22 kg/m2 Gen: NAD, alert, cooperative with exam HEENT: NCAT Ext: No edema, warm Neuro: Alert and oriented, No gross deficits MSK: R knee without erythema, effusion, bruising, or gross deformity No joint line tenderness.  ligamentously intact to Lachman's and with varus and valgus stress.  Negative McMurray's test Medial pain with valgus stress   Assessment and plan:  # R knee pain PLain film from a few months ago with mild OA Likely medial meniscus injury Offered injection vs conservative vs referral She desires conservative Tx, Ice tramadol (no NSAIDS with age and hx of bleeding problems on NSAIDS), compression Return if not better for injection vs Ortho referral    Meds ordered this encounter  Medications  . traMADol (ULTRAM) 50 MG tablet    Sig: Take 1 tablet (50 mg total) by mouth every 8 (eight) hours as needed.    Dispense:  30 tablet    Refill:  0    Laroy Apple, MD [facility] 01/30/2015, 12:31 PM

## 2015-01-30 NOTE — Patient Instructions (Signed)
Great to meet you!  Get a knee sleeve, they will have it t the medical supply and probably at the pharmacy., Wear it while doing activities  Try Ice 15 minutes 2-3 times daily  Continue tylenol if it helps, no more than 6 pills daily Try the tramadol for pain.   Come back if you do not get better. We could discuss injection or send you to an orthopedist.

## 2015-02-11 ENCOUNTER — Telehealth: Payer: Self-pay | Admitting: Family Medicine

## 2015-02-13 ENCOUNTER — Other Ambulatory Visit: Payer: Self-pay | Admitting: Family Medicine

## 2015-02-24 ENCOUNTER — Ambulatory Visit (INDEPENDENT_AMBULATORY_CARE_PROVIDER_SITE_OTHER): Payer: Medicare Other | Admitting: Pharmacist

## 2015-02-24 ENCOUNTER — Encounter: Payer: Self-pay | Admitting: Pharmacist

## 2015-02-24 VITALS — BP 142/78 | HR 70 | Ht 65.0 in | Wt 208.0 lb

## 2015-02-24 DIAGNOSIS — M25562 Pain in left knee: Secondary | ICD-10-CM

## 2015-02-24 DIAGNOSIS — Z9181 History of falling: Secondary | ICD-10-CM

## 2015-02-24 DIAGNOSIS — Z0000 Encounter for general adult medical examination without abnormal findings: Secondary | ICD-10-CM

## 2015-02-24 DIAGNOSIS — M25561 Pain in right knee: Secondary | ICD-10-CM

## 2015-02-24 NOTE — Progress Notes (Signed)
Patient ID: Jenna [name], female   DOB: [birth year], 79 y.o.   MRN: [id number]    Subjective:   Jenna [name] is a 79 y.o. female who presents for a subsequent Medicare Annual Wellness Visit.  Jenna [name] is a white female who is married.  She lives in [place] with her husband and is retired from [employer].   Her main health concern today is pain that she is having in both knees.  She saw Dr Gladstone Lighter who diagnosed her with arthritis.  She has fallen twice in the last year with the worse in June 2016 when she injured her left knee.    Current Medications (verified) Outpatient Encounter Prescriptions as of 02/24/2015  Medication Sig  . aspirin EC 81 MG tablet Take 81 mg by mouth daily. Takes 2-3 times weekly  . Cholecalciferol (VITAMIN D3) 2000 UNITS TABS Take 1 tablet by mouth daily.    . colchicine (COLCRYS) 0.6 MG tablet TAKE (1) TABLET BY MOUTH ONCE DAILY as needed for gout flares  . fish oil-omega-3 fatty acids 1000 MG capsule Take 1 g by mouth daily.   . furosemide (LASIX) 20 MG tablet TAKE 1/2 TO 1 TABLET DAILY AS NEEDED.  [name] levothyroxine (SYNTHROID, LEVOTHROID) 75 MCG tablet TAKE (1) TABLET BY MOUTH ONCE DAILY.  . metoprolol (LOPRESSOR) 50 MG tablet TAKE (1) TABLET BY MOUTH ONCE DAILY.  [name] PROCTOSOL HC 2.5 % rectal cream APPLY RECTALLY DAILY AS NEEDED FOR HEMORRHOIDS OR ITCHING.  . traMADol (ULTRAM) 50 MG tablet Take 1 tablet (50 mg total) by mouth every 8 (eight) hours as needed.   No facility-administered encounter medications on file as of 02/24/2015.    Allergies (verified) Cymbalta; Daypro; Diclofenac sodium; Effexor; Lipitor; Penicillins; Prozac; Sulfa antibiotics; Zocor; Amitriptyline; Milnacipran hcl; and Savella   History: Past Medical History  Diagnosis Date  . OA (osteoarthritis)   . Hypothyroidism   . Fibromyalgia   . Osteoporosis   . PUD (peptic ulcer disease)   . DDD (degenerative disc disease)   . Herpes zoster   . Palpitations   . Cataract   .  Hyperlipidemia   . History of skin cancer   . Hypertension   . Skin cancer of nose     [name]   Past Surgical History  Procedure Laterality Date  . Cesarean section    . Breast biopsy      Right  . Cataract extraction w/phaco  10/28/2011    Procedure: CATARACT EXTRACTION PHACO AND INTRAOCULAR LENS PLACEMENT (IOC);  Surgeon: Tonny Branch, MD;  Location: [facility];  Service: Ophthalmology;  Laterality: Right;  CDE 18.38  . Cataract extraction w/phaco  02/14/2012    Procedure: CATARACT EXTRACTION PHACO AND INTRAOCULAR LENS PLACEMENT (IOC);  Surgeon: Tonny Branch, MD;  Location: [facility];  Service: Ophthalmology;  Laterality: Left;  CDE 17.20  . Colonoscopy N/A 05/31/2013    Procedure: COLONOSCOPY;  Surgeon: Rogene Houston, MD;  Location: [facility];  Service: Endoscopy;  Laterality: N/A;  240  . Eye surgery    . Abdominal hysterectomy      partial  . Fracture surgery  1952    fractured right arm   Family History  Problem Relation Age of Onset  . Coronary artery disease Neg Hx     No premature  . Anesthesia problems Neg Hx   . Hypotension Neg Hx   . Malignant hyperthermia Neg Hx   . Pseudochol deficiency Neg Hx   . CVA Mother   .  Stroke Mother   . Osteoporosis Mother   . Cancer Father     prostate  . Bronchitis Sister   . COPD Sister   . Early death Brother     died in [place]  . Carpal tunnel syndrome Sister   . Cancer Brother     throat  . Cancer Brother     lung  . Heart attack Brother   . Cancer Brother     throat  . Stroke Brother   . Gout Brother   . Heart Problems Brother     stents  . CAD Brother   . Hypertension Brother   . Gout Brother   . Arthritis Brother    Social History   Occupational History  . Not on file.   Social History Main Topics  . Smoking status: Never Smoker   . Smokeless tobacco: Never Used  . Alcohol Use: No  . Drug Use: No  . Sexual Activity: Yes    Birth Control/ Protection: Surgical    Do you feel safe at home?  Yes  Dietary  issues and exercise activities: Current Exercise Habits:: Exercise is limited by;The patient does not participate in regular exercise at present, Limited by:: orthopedic condition(s)  Current Dietary habits:  Trying to limit serving sizes and decrease caloric intake to lose weight.  She has lost about 4# since July 2016.  She thinks that weight taken 01/2015 was incorrect because she was leaning on cane.   Objective:    Today's Vitals   02/24/15 1509  BP: 142/78  Pulse: 70  Height: 5\' 5"  (1.651 m)  Weight: 208 lb (94.348 kg)  PainSc: 2   PainLoc: Knee   Body mass index is 34.61 kg/(m^2).  Activities of Daily Living In your present state of health, do you have any difficulty performing the following activities: 02/24/2015  Hearing? N  Vision? N  Difficulty concentrating or making decisions? N  Walking or climbing stairs? Y  Dressing or bathing? Y  Doing errands, shopping? N  Preparing Food and eating ? N  Using the Toilet? N  In the past six months, have you accidently leaked urine? N  Do you have problems with loss of bowel control? N  Managing your Medications? N  Managing your Finances? N  Housekeeping or managing your Housekeeping? N    Are there smokers in your home (other than you)? No   Cardiac Risk Factors include: advanced age (>38men, >11 women);dyslipidemia;family history of premature cardiovascular disease;hypertension;obesity (BMI >30kg/m2);sedentary lifestyle  Depression Screen PHQ 2/9 Scores 02/24/2015 01/30/2015 12/02/2014 07/15/2014  PHQ - 2 Score 2 4 3 1   PHQ- 9 Score 3 11 11  -    Fall Risk Fall Risk  02/24/2015 01/30/2015 12/02/2014 07/15/2014 02/26/2014  Falls in the past year? Yes Yes Yes Yes No  Number falls in past yr: 2 or more 2 or more 1 1 -  Injury with Fall? Yes Yes Yes No -  Risk for fall due to : - - History of fall(s) - -  Follow up Falls evaluation completed;Falls prevention discussed - Falls prevention discussed - -    Cognitive Function: MMSE  - Mini Mental State Exam 02/24/2015 02/24/2015  Orientation to time 5 -  Orientation to Place 5 -  Registration 3 3  Attention/ Calculation 5 -  Recall 3 -  Language- name 2 objects 2 -  Language- repeat 1 -  Language- follow 3 step command 3 -  Language- read & follow direction 1 -  Write a sentence 1 1  Copy design 1 -  Total score 30 -    Immunizations and Health Maintenance Immunization History  Administered Date(s) Administered  . Pneumococcal Conjugate-13 02/21/2013  . Pneumococcal Polysaccharide-23 05/24/2006  . Td 09/21/2004   There are no preventive care reminders to display for this patient.  Patient Care Team: Chipper Herb, MD as PCP - General (Family Medicine) Rogene Houston, MD as Consulting Physician (Gastroenterology) Satira Sark, MD as Consulting Physician (Cardiology)  Indicate any recent Medical Services you may have received from other than [facility] providers in the past year (date may be approximate).    Assessment:    Annual Wellness Visit  High fall risk  Knee pain   Screening Tests Health Maintenance  Topic Date Due  . TETANUS/TDAP  04/14/2015 (Originally 09/22/2014)  . ZOSTAVAX  10/24/2015 (Originally 07/20/1997)  . INFLUENZA VACCINE  02/23/2016 (Originally 12/23/2014)  . DEXA SCAN  07/26/2015  . MAMMOGRAM  10/17/2015  . PNA vac Low Risk Adult  Completed        Plan:   During the course of the visit Mia was educated and counseled about the following appropriate screening and preventive services:   Vaccines to include Pneumoccal, Influenza, Hepatitis B, Td, Zostavax - patient declined influenza vaccine and Tdap today (cost of Tdap today was verified at $7.60),  Patient has history of shingles so Zostavax not indicated  Colorectal cancer screening - UTD on FOBT and colonosocpy  Cardiovascular disease screening - UTD.    Last lipid panel showed elevated LDL but patient has refused statins as when she tried in past she had myalgias.    SBP was slightly elevated today but is usually at goal.  DBP was at goal   Diabetes screening - last BG was 102  Bone Denisty / Osteoporosis Screening - UTD  Mammogram - UTD due 2017  PAP - UTD - due 2017  Glaucoma screening / Diabetic Eye Exam - UTD  Nutrition counseling - discussed limiting sugar and high CHO foods.  Increase fruits, nonstarchy vegetables and lean proteins.  Advanced Directives - patient declined.   Referral to ortho for reevaluation since fall.  Referral to PT for fall risk and balance assessment.   Discussed chair exercises and handout given.   Patient Instructions (the written plan) were given to the patient.   Cherre Robins, [facility]   02/24/2015

## 2015-02-24 NOTE — Patient Instructions (Signed)
Jenna [name] , Thank you for taking time to come for your Medicare Wellness Visit. I appreciate your ongoing commitment to your health goals. Please review the following plan we discussed and let me know if I can assist you in the future.   These are the goals we discussed:   Increase non-starchy vegetables - carrots, green bean, squash, zucchini, tomatoes, onions, peppers, spinach and other green leafy vegetables, cabbage, lettuce, cucumbers, asparagus, okra (not fried), eggplant limit sugar and processed foods (cakes, cookies, ice cream, crackers and chips) Increase fresh fruit but limit serving sizes 1/2 cup or about the size of tennis or baseball limit red meat to no more than 1-2 times per week (serving size about the size of your palm) Choose whole grains / lean proteins - whole wheat bread, quinoa, whole grain rice (1/2 cup), fish, chicken, [place]  Increase physical activity - referral sent for physical therapy for a balance assessment    This is a list of the screening recommended for you and due dates:  Health Maintenance  Topic Date Due  . Tetanus Vaccine  09/22/2014  . Flu Shot  Patient doesn't usually get.   . Shingles Vaccine  No indicated because patient has had shingles  . Mammogram  10/17/2015  . DEXA scan (bone density measurement)  07/26/2015  . Pneumonia vaccines  Completed  *Topic was postponed. The date shown is not the original due date.      Health Maintenance Adopting a healthy lifestyle and getting preventive care can go a long way to promote health and wellness. Talk with your health care provider about what schedule of regular examinations is right for you. This is a good chance for you to check in with your provider about disease prevention and staying healthy. In between checkups, there are plenty of things you can do on your own. Experts have done a lot of research about which lifestyle changes and preventive measures are most likely to keep you  healthy. Ask your health care provider for more information. WEIGHT AND DIET  Eat a healthy diet  Be sure to include plenty of vegetables, fruits, low-fat dairy products, and lean protein.  Do not eat a lot of foods high in solid fats, added sugars, or salt.  Get regular exercise. This is one of the most important things you can do for your health.  Most adults should exercise for at least 150 minutes each week. The exercise should increase your heart rate and make you sweat (moderate-intensity exercise).  Most adults should also do strengthening exercises at least twice a week. This is in addition to the moderate-intensity exercise.  Maintain a healthy weight  Body mass index (BMI) is a measurement that can be used to identify possible weight problems. It estimates body fat based on height and weight. Your health care provider can help determine your BMI and help you achieve or maintain a healthy weight.  For females 92 years of age and older:   A BMI below 18.5 is considered underweight.  A BMI of 18.5 to 24.9 is normal.  A BMI of 25 to 29.9 is considered overweight.  A BMI of 30 and above is considered obese.  Watch levels of cholesterol and blood lipids  You should start having your blood tested for lipids and cholesterol at 77 years of age, then have this test every 5 years.  You may need to have your cholesterol levels checked more often if:  Your lipid or cholesterol levels are high.  You are older than 77 years of age.  You are at high risk for heart disease.  CANCER SCREENING   Lung Cancer  Lung cancer screening is recommended for adults 65-21 years old who are at high risk for lung cancer because of a history of smoking.  A yearly low-dose CT scan of the lungs is recommended for people who:  Currently smoke.  Have quit within the past 15 years.  Have at least a 30-pack-year history of smoking. A pack year is smoking an average of one pack of cigarettes  a day for 1 year.  Yearly screening should continue until it has been 15 years since you quit.  Yearly screening should stop if you develop a health problem that would prevent you from having lung cancer treatment.  Breast Cancer  Practice breast self-awareness. This means understanding how your breasts normally appear and feel.  It also means doing regular breast self-exams. Let your health care provider know about any changes, no matter how small.  If you are in your 20s or 30s, you should have a clinical breast exam (CBE) by a health care provider every 1-3 years as part of a regular health exam.  If you are 38 or older, have a CBE every year. Also consider having a breast X-ray (mammogram) every year.  If you have a family history of breast cancer, talk to your health care provider about genetic screening.  If you are at high risk for breast cancer, talk to your health care provider about having an MRI and a mammogram every year.  Breast cancer gene (BRCA) assessment is recommended for women who have family members with BRCA-related cancers. BRCA-related cancers include:  Breast.  Ovarian.  Tubal.  Peritoneal cancers.  Results of the assessment will determine the need for genetic counseling and BRCA1 and BRCA2 testing. Cervical Cancer Routine pelvic examinations to screen for cervical cancer are no longer recommended for nonpregnant women who are considered low risk for cancer of the pelvic organs (ovaries, uterus, and vagina) and who do not have symptoms. A pelvic examination may be necessary if you have symptoms including those associated with pelvic infections. Ask your health care provider if a screening pelvic exam is right for you.   The Pap test is the screening test for cervical cancer for women who are considered at risk.  If you had a hysterectomy for a problem that was not cancer or a condition that could lead to cancer, then you no longer need Pap tests.  If you  are older than 65 years, and you have had normal Pap tests for the past 10 years, you no longer need to have Pap tests.  If you have had past treatment for cervical cancer or a condition that could lead to cancer, you need Pap tests and screening for cancer for at least 20 years after your treatment.  If you no longer get a Pap test, assess your risk factors if they change (such as having a new sexual partner). This can affect whether you should start being screened again.  Some women have medical problems that increase their chance of getting cervical cancer. If this is the case for you, your health care provider may recommend more frequent screening and Pap tests.  The human papillomavirus (HPV) test is another test that may be used for cervical cancer screening. The HPV test looks for the virus that can cause cell changes in the cervix. The cells collected during the Pap test can  be tested for HPV.  The HPV test can be used to screen women 110 years of age and older. Getting tested for HPV can extend the interval between normal Pap tests from three to five years.  An HPV test also should be used to screen women of any age who have unclear Pap test results.  After 77 years of age, women should have HPV testing as often as Pap tests.  Colorectal Cancer  This type of cancer can be detected and often prevented.  Routine colorectal cancer screening usually begins at 77 years of age and continues through 77 years of age.  Your health care provider may recommend screening at an earlier age if you have risk factors for colon cancer.  Your health care provider may also recommend using home test kits to check for hidden blood in the stool.  A small camera at the end of a tube can be used to examine your colon directly (sigmoidoscopy or colonoscopy). This is done to check for the earliest forms of colorectal cancer.  Routine screening usually begins at age 67.  Direct examination of the colon  should be repeated every 5-10 years through 77 years of age. However, you may need to be screened more often if early forms of precancerous polyps or small growths are found. Skin Cancer  Check your skin from head to toe regularly.  Tell your health care provider about any new moles or changes in moles, especially if there is a change in a mole's shape or color.  Also tell your health care provider if you have a mole that is larger than the size of a pencil eraser.  Always use sunscreen. Apply sunscreen liberally and repeatedly throughout the day.  Protect yourself by wearing long sleeves, pants, a wide-brimmed hat, and sunglasses whenever you are outside. HEART DISEASE, DIABETES, AND HIGH BLOOD PRESSURE   Have your blood pressure checked at least every 1-2 years. High blood pressure causes heart disease and increases the risk of stroke.  If you are between 11 years and 69 years old, ask your health care provider if you should take aspirin to prevent strokes.  Have regular diabetes screenings. This involves taking a blood sample to check your fasting blood sugar level.  If you are at a normal weight and have a low risk for diabetes, have this test once every three years after 77 years of age.  If you are overweight and have a high risk for diabetes, consider being tested at a younger age or more often. PREVENTING INFECTION  Hepatitis B  If you have a higher risk for hepatitis B, you should be screened for this virus. You are considered at high risk for hepatitis B if:  You were born in a country where hepatitis B is common. Ask your health care provider which countries are considered high risk.  Your parents were born in a high-risk country, and you have not been immunized against hepatitis B (hepatitis B vaccine).  You have HIV or AIDS.  You use needles to inject street drugs.  You live with someone who has hepatitis B.  You have had sex with someone who has hepatitis B.  You  get hemodialysis treatment.  You take certain medicines for conditions, including cancer, organ transplantation, and autoimmune conditions. Hepatitis C  Blood testing is recommended for:  Everyone born from [birth year] through 1963.  Anyone with known risk factors for hepatitis C. Sexually transmitted infections (STIs)  You should be screened for sexually  transmitted infections (STIs) including gonorrhea and chlamydia if:  You are sexually active and are younger than 77 years of age.  You are older than 77 years of age and your health care provider tells you that you are at risk for this type of infection.  Your sexual activity has changed since you were last screened and you are at an increased risk for chlamydia or gonorrhea. Ask your health care provider if you are at risk.  If you do not have HIV, but are at risk, it may be recommended that you take a prescription medicine daily to prevent HIV infection. This is called pre-exposure prophylaxis (PrEP). You are considered at risk if:  You are sexually active and do not regularly use condoms or know the HIV status of your partner(s).  You take drugs by injection.  You are sexually active with a partner who has HIV. Talk with your health care provider about whether you are at high risk of being infected with HIV. If you choose to begin PrEP, you should first be tested for HIV. You should then be tested every 3 months for as long as you are taking PrEP.  PREGNANCY   If you are premenopausal and you may become pregnant, ask your health care provider about preconception counseling.  If you may become pregnant, take 400 to 800 micrograms (mcg) of folic acid every day.  If you want to prevent pregnancy, talk to your health care provider about birth control (contraception). OSTEOPOROSIS AND MENOPAUSE   Osteoporosis is a disease in which the bones lose minerals and strength with aging. This can result in serious bone fractures. Your risk for  osteoporosis can be identified using a bone density scan.  If you are 49 years of age or older, or if you are at risk for osteoporosis and fractures, ask your health care provider if you should be screened.  Ask your health care provider whether you should take a calcium or vitamin D supplement to lower your risk for osteoporosis.  Menopause may have certain physical symptoms and risks.  Hormone replacement therapy may reduce some of these symptoms and risks. Talk to your health care provider about whether hormone replacement therapy is right for you.  HOME CARE INSTRUCTIONS   Schedule regular health, dental, and eye exams.  Stay current with your immunizations.   Do not use any tobacco products including cigarettes, chewing tobacco, or electronic cigarettes.  If you are pregnant, do not drink alcohol.  If you are breastfeeding, limit how much and how often you drink alcohol.  Limit alcohol intake to no more than 1 drink per day for nonpregnant women. One drink equals 12 ounces of beer, 5 ounces of wine, or 1 ounces of hard liquor.  Do not use street drugs.  Do not share needles.  Ask your health care provider for help if you need support or information about quitting drugs.  Tell your health care provider if you often feel depressed.  Tell your health care provider if you have ever been abused or do not feel safe at home. Document Released: 11/23/2010 Document Revised: 09/24/2013 Document Reviewed: 04/11/2013 [facility] Patient Information 2015 [place], [place]. This information is not intended to replace advice given to you by your health care provider. Make sure you discuss any questions you have with your health care provider.

## 2015-03-06 ENCOUNTER — Other Ambulatory Visit: Payer: Self-pay | Admitting: Family Medicine

## 2015-03-11 ENCOUNTER — Encounter: Payer: Self-pay | Admitting: Family Medicine

## 2015-03-21 DIAGNOSIS — M170 Bilateral primary osteoarthritis of knee: Secondary | ICD-10-CM | POA: Diagnosis not present

## 2015-04-14 ENCOUNTER — Ambulatory Visit (INDEPENDENT_AMBULATORY_CARE_PROVIDER_SITE_OTHER): Payer: Medicare Other | Admitting: Family Medicine

## 2015-04-14 ENCOUNTER — Encounter: Payer: Self-pay | Admitting: Family Medicine

## 2015-04-14 ENCOUNTER — Ambulatory Visit: Payer: Medicare Other | Admitting: Physical Therapy

## 2015-04-14 VITALS — BP 139/68 | HR 70 | Temp 97.5°F | Ht 65.0 in | Wt 208.0 lb

## 2015-04-14 DIAGNOSIS — I10 Essential (primary) hypertension: Secondary | ICD-10-CM | POA: Diagnosis not present

## 2015-04-14 DIAGNOSIS — E039 Hypothyroidism, unspecified: Secondary | ICD-10-CM | POA: Diagnosis not present

## 2015-04-14 DIAGNOSIS — E559 Vitamin D deficiency, unspecified: Secondary | ICD-10-CM

## 2015-04-14 DIAGNOSIS — M797 Fibromyalgia: Secondary | ICD-10-CM

## 2015-04-14 DIAGNOSIS — M170 Bilateral primary osteoarthritis of knee: Secondary | ICD-10-CM | POA: Diagnosis not present

## 2015-04-14 DIAGNOSIS — E785 Hyperlipidemia, unspecified: Secondary | ICD-10-CM | POA: Diagnosis not present

## 2015-04-14 DIAGNOSIS — I480 Paroxysmal atrial fibrillation: Secondary | ICD-10-CM | POA: Diagnosis not present

## 2015-04-14 MED ORDER — METOPROLOL TARTRATE 50 MG PO TABS: ORAL_TABLET | ORAL | Status: DC

## 2015-04-14 MED ORDER — LEVOTHYROXINE SODIUM 75 MCG PO TABS: ORAL_TABLET | ORAL | Status: DC

## 2015-04-14 NOTE — Patient Instructions (Addendum)
Medicare Annual Wellness Visit  No[facility] and the medical providers at [facility] strive to bring you the best medical care.  In doing so we not only want to address your current medical conditions and concerns but also to detect new conditions early and prevent illness, disease and health-related problems.    Medicare offers a yearly Wellness Visit which allows our clinical staff to assess your need for preventative services including immunizations, lifestyle education, counseling to decrease risk of preventable diseases and screening for fall risk and other medical concerns.    This visit is provided free of charge (no copay) for all Medicare recipients. The clinical pharmacists at [facility] have begun to conduct these Wellness Visits which will also include a thorough review of all your medications.    As you primary medical provider recommend that you make an appointment for your Annual Wellness Visit if you have not done so already this year.  You may set up this appointment before you leave today or you may call back [phone]) and schedule an appointment.  Please make sure when you call that you mention that you are scheduling your Annual Wellness Visit with the clinical pharmacist so that the appointment may be made for the proper length of time.     Continue current medications. Continue good therapeutic lifestyle changes which include good diet and exercise. Fall precautions discussed with patient. If an FOBT was given today- please return it to our front desk. If you are over 77 years old - you may need Prevnar 50 or the adult Pneumonia vaccine.  **Flu shots are available--- please call and schedule a FLU-CLINIC appointment**  After your visit with [place] today you will receive a survey in the mail or online from [employer] regarding your care with [place]. Please take a moment to fill this out. Your feedback is very  important to [place] as you can help [place] better understand your patient needs as well as improve your experience and satisfaction. WE CARE ABOUT YOU!!!   Continue to follow-up with orthopedist as needed We will call the lab work results as soon as they become available Continue to follow-up with cardiology as planned Return the FOBT Don't forget to get your pelvic exam and mammogram in 79

## 2015-04-14 NOTE — Progress Notes (Signed)
Subjective:    Patient ID: Jenna [name], female    DOB: 1935-10-26, 79 y.o.   MRN: [id number]  HPI Pt here for follow up and management of chronic medical problems which includes hypertension, hyperlipidemia, and hypothyroid. She is taking medications regularly. The patient comes in today with no specific complaints. She does have hyperlipidemia and is statin intolerant. She takes levothyroxine for her hypothyroidism. The patient has a remote history of atrial fibrillation and does see the cardiologist once yearly. She has not had any problems with this recently. She is not taking any anticoagulants. She denies chest pain shortness of breath trouble swallowing heartburn indigestion nausea vomiting diarrhea or blood in the stool. She is passing her water without problem. She has been to see the orthopedist recently received an injection in each knee and is doing much better with the joint pain in her knees.      Patient Active Problem List   Diagnosis Date Noted  . Right knee pain 01/30/2015  . Hypertension   . Precordial pain 02/05/2014  . Palpitations 02/05/2014  . History of shingles 01/11/2014  . Pre-diabetes 11/21/2013  . Obesity (BMI 30.0-34.9) 11/21/2013  . Mixed hypercholesterolemia and hypertriglyceridemia 11/21/2013  . Metabolic syndrome 57/84/6962  . Multiple drug allergies 10/23/2013  . Statin intolerance 10/23/2013  . Osteopenia of the elderly 07/25/2013  . Vitamin D deficiency 06/19/2013  . Unspecified constipation 04/23/2013  . Rectal bleeding 04/23/2013  . Gout 02/21/2013  . Osteoarthritis 02/21/2013  . Fibromyalgia   . Hyperlipidemia 11/14/2008   Outpatient Encounter Prescriptions as of 04/14/2015  Medication Sig  . aspirin EC 81 MG tablet Take 81 mg by mouth daily. Takes 2-3 times weekly  . Cholecalciferol (VITAMIN D3) 2000 UNITS TABS Take 1 tablet by mouth daily.    . colchicine 0.6 MG tablet TAKE 1 TABLET ONCE DAILY.  . fish oil-omega-3 fatty acids 1000  MG capsule Take 1 g by mouth daily.   . furosemide (LASIX) 20 MG tablet TAKE 1/2 TO 1 TABLET DAILY AS NEEDED.  [name] levothyroxine (SYNTHROID, LEVOTHROID) 75 MCG tablet TAKE (1) TABLET BY MOUTH ONCE DAILY.  . metoprolol (LOPRESSOR) 50 MG tablet TAKE (1) TABLET BY MOUTH ONCE DAILY.  [name] PROCTOSOL HC 2.5 % rectal cream APPLY RECTALLY DAILY AS NEEDED FOR HEMORRHOIDS OR ITCHING.  . traMADol (ULTRAM) 50 MG tablet Take 1 tablet (50 mg total) by mouth every 8 (eight) hours as needed.   No facility-administered encounter medications on file as of 04/14/2015.      Review of Systems  Constitutional: Negative.   HENT: Negative.   Eyes: Negative.   Respiratory: Negative.   Cardiovascular: Negative.   Gastrointestinal: Negative.   Endocrine: Negative.   Genitourinary: Negative.   Musculoskeletal: Negative.   Skin: Negative.   Allergic/Immunologic: Negative.   Neurological: Negative.   Hematological: Negative.   Psychiatric/Behavioral: Negative.        Objective:   Physical Exam  Constitutional: She is oriented to person, place, and time. She appears well-developed and well-nourished. No distress.  HENT:  Head: Normocephalic and atraumatic.  Right Ear: External ear normal.  Left Ear: External ear normal.  Nose: Nose normal.  Mouth/Throat: Oropharynx is clear and moist.  Eyes: Conjunctivae and EOM are normal. Pupils are equal, round, and reactive to light. Right eye exhibits no discharge. Left eye exhibits no discharge. No scleral icterus.  Neck: Normal range of motion. Neck supple. No thyromegaly present.  No carotid bruits or thyromegaly or anterior cervical adenopathy  Cardiovascular: Normal rate, regular rhythm, normal heart sounds and intact distal pulses.   No murmur heard. Rhythm is regular at 72/m  Pulmonary/Chest: Effort normal and breath sounds normal. No respiratory distress. She has no wheezes. She has no rales. She exhibits no tenderness.  Clear anteriorly and posteriorly    Abdominal: Soft. Bowel sounds are normal. She exhibits no mass. There is no tenderness. There is no rebound and no guarding.  No bruits tenderness or organ enlargement  Musculoskeletal: Normal range of motion. She exhibits no edema or tenderness.  Lymphadenopathy:    She has no cervical adenopathy.  Neurological: She is alert and oriented to person, place, and time. She has normal reflexes. No cranial nerve deficit.  Skin: Skin is warm and dry. No rash noted.  Psychiatric: She has a normal mood and affect. Her behavior is normal. Judgment and thought content normal.  Nursing note and vitals reviewed.  BP 139/68 mmHg  Pulse 70  Temp(Src) 97.5 F (36.4 C) (Oral)  Ht 5' 5"  (1.651 m)  Wt 208 lb (94.348 kg)  BMI 34.61 kg/m2        Assessment & Plan:  1. Hyperlipidemia -The patient is statin intolerant and must continue with aggressive therapeutic lifestyle changes which include diet and exercise - CBC with Differential/Platelet - NMR, lipoprofile  2. Vitamin D deficiency -Continue with vitamin D replacement pending results of lab work - CBC with Differential/Platelet - VITAMIN D 25 Hydroxy (Vit-D Deficiency, Fractures)  3. Essential hypertension -The blood pressure is good today and she will continue with her furosemide and metoprolol - BMP8+EGFR - CBC with Differential/Platelet - Hepatic function panel  4. Fibromyalgia -She is currently doing well with this with no specific complaints. - CBC with Differential/Platelet  5. Hypothyroidism, unspecified hypothyroidism type -Continue current treatment pending results of lab work - CBC with Differential/Platelet - Thyroid Panel With TSH  6. Primary osteoarthritis of both knees -Continue follow-up with orthopedist as needed  7. Paroxysmal atrial fibrillation ([place]) -Continue yearly follow-up with cardiology, Dr. Domenic Polite Meds ordered this encounter  Medications  . metoprolol (LOPRESSOR) 50 MG tablet    Sig: TAKE (1)  TABLET BY MOUTH ONCE DAILY.    Dispense:  30 tablet    Refill:  7  . levothyroxine (SYNTHROID, LEVOTHROID) 75 MCG tablet    Sig: TAKE (1) TABLET BY MOUTH ONCE DAILY.    Dispense:  30 tablet    Refill:  7   Patient Instructions                       Medicare Annual Wellness Visit  [facility] and the medical providers at [facility] strive to bring you the best medical care.  In doing so we not only want to address your current medical conditions and concerns but also to detect new conditions early and prevent illness, disease and health-related problems.    Medicare offers a yearly Wellness Visit which allows our clinical staff to assess your need for preventative services including immunizations, lifestyle education, counseling to decrease risk of preventable diseases and screening for fall risk and other medical concerns.    This visit is provided free of charge (no copay) for all Medicare recipients. The clinical pharmacists at [facility] have begun to conduct these Wellness Visits which will also include a thorough review of all your medications.    As you primary medical provider recommend that you make an appointment for your Annual Wellness Visit  if you have not done so already this year.  You may set up this appointment before you leave today or you may call back ([phone]) and schedule an appointment.  Please make sure when you call that you mention that you are scheduling your Annual Wellness Visit with the clinical pharmacist so that the appointment may be made for the proper length of time.     Continue current medications. Continue good therapeutic lifestyle changes which include good diet and exercise. Fall precautions discussed with patient. If an FOBT was given today- please return it to our front desk. If you are over 51 years old - you may need Prevnar 33 or the adult Pneumonia vaccine.  **Flu shots are available--- please  call and schedule a FLU-CLINIC appointment**  After your visit with [place] today you will receive a survey in the mail or online from [employer] regarding your care with [place]. Please take a moment to fill this out. Your feedback is very important to [place] as you can help [place] better understand your patient needs as well as improve your experience and satisfaction. WE CARE ABOUT YOU!!!   Continue to follow-up with orthopedist as needed We will call the lab work results as soon as they become available Continue to follow-up with cardiology as planned Return the FOBT Don't forget to get your pelvic exam and mammogram in 2017   Arrie Senate MD

## 2015-04-15 LAB — BMP8+EGFR
BUN/Creatinine Ratio: 25 (ref 11–26)
BUN: 15 mg/dL (ref 8–27)
CALCIUM: 9.2 mg/dL (ref 8.7–10.3)
CHLORIDE: 102 mmol/L (ref 97–106)
CO2: 28 mmol/L (ref 18–29)
Creatinine, Ser: 0.61 mg/dL (ref 0.57–1.00)
GFR calc non Af Amer: 88 mL/min/{1.73_m2} (ref >59)
GFR, EST AFRICAN AMERICAN: 101 mL/min/{1.73_m2} (ref >59)
Glucose: 98 mg/dL (ref 65–99)
Potassium: 4.7 mmol/L (ref 3.5–5.2)
Sodium: 145 mmol/L — ABNORMAL HIGH (ref 136–144)

## 2015-04-15 LAB — HEPATIC FUNCTION PANEL
ALT: 11 IU/L (ref 0–32)
AST: 14 IU/L (ref 0–40)
Albumin: 4.1 g/dL (ref 3.5–4.8)
Alkaline Phosphatase: 96 IU/L (ref 39–117)
BILIRUBIN TOTAL: 0.6 mg/dL (ref 0.0–1.2)
BILIRUBIN, DIRECT: 0.19 mg/dL (ref 0.00–0.40)
TOTAL PROTEIN: 6.1 g/dL (ref 6.0–8.5)

## 2015-04-15 LAB — CBC WITH DIFFERENTIAL/PLATELET
BASOS ABS: 0 10*3/uL (ref 0.0–0.2)
Basos: 0 %
EOS (ABSOLUTE): 0.1 10*3/uL (ref 0.0–0.4)
Eos: 1 %
HEMOGLOBIN: 13.6 g/dL (ref 11.1–15.9)
Hematocrit: 39.6 % (ref 34.0–46.6)
IMMATURE GRANS (ABS): 0 10*3/uL (ref 0.0–0.1)
Immature Granulocytes: 0 %
LYMPHS: 22 %
Lymphocytes Absolute: 1.2 10*3/uL (ref 0.7–3.1)
MCH: 32.8 pg (ref 26.6–33.0)
MCHC: 34.3 g/dL (ref 31.5–35.7)
MCV: 95 fL (ref 79–97)
MONOCYTES: 6 %
Monocytes Absolute: 0.3 10*3/uL (ref 0.1–0.9)
NEUTROS ABS: 3.9 10*3/uL (ref 1.4–7.0)
NEUTROS PCT: 71 %
Platelets: 186 10*3/uL (ref 150–379)
RBC: 4.15 x10E6/uL (ref 3.77–5.28)
RDW: 13.9 % (ref 12.3–15.4)
WBC: 5.5 10*3/uL (ref 3.4–10.8)

## 2015-04-15 LAB — THYROID PANEL WITH TSH
Free Thyroxine Index: 2.6 (ref 1.2–4.9)
T3 Uptake Ratio: 27 % (ref 24–39)
T4 TOTAL: 9.7 ug/dL (ref 4.5–12.0)
TSH: 1.99 u[IU]/mL (ref 0.450–4.500)

## 2015-04-15 LAB — NMR, LIPOPROFILE
CHOLESTEROL: 220 mg/dL — AB (ref 100–199)
HDL Cholesterol by NMR: 65 mg/dL (ref >39)
HDL PARTICLE NUMBER: 38.7 umol/L (ref >=30.5)
LDL PARTICLE NUMBER: 1555 nmol/L — AB (ref <1000)
LDL SIZE: 21.3 nm (ref >20.5)
LDL-C: 133 mg/dL — ABNORMAL HIGH (ref 0–99)
LP-IR SCORE: 50 — AB (ref <=45)
SMALL LDL PARTICLE NUMBER: 163 nmol/L (ref <=527)
TRIGLYCERIDES BY NMR: 111 mg/dL (ref 0–149)

## 2015-04-15 LAB — VITAMIN D 25 HYDROXY (VIT D DEFICIENCY, FRACTURES): Vit D, 25-Hydroxy: 40.4 ng/mL (ref 30.0–100.0)

## 2015-04-18 ENCOUNTER — Telehealth: Payer: Self-pay | Admitting: Family Medicine

## 2015-04-21 NOTE — Telephone Encounter (Signed)
Patient aware of results.

## 2015-06-17 ENCOUNTER — Ambulatory Visit (INDEPENDENT_AMBULATORY_CARE_PROVIDER_SITE_OTHER): Payer: Medicare Other | Admitting: Family Medicine

## 2015-06-17 ENCOUNTER — Encounter: Payer: Self-pay | Admitting: Family Medicine

## 2015-06-17 VITALS — BP 127/60 | HR 63 | Temp 97.6°F | Ht 65.0 in | Wt 210.0 lb

## 2015-06-17 DIAGNOSIS — R0602 Shortness of breath: Secondary | ICD-10-CM | POA: Diagnosis not present

## 2015-06-17 NOTE — Patient Instructions (Signed)
Great to see you!  Lets watch and wait to see if your symptoms resolve or persist, at this time I think you are safe and there isnt anything to worry about.   Please seek medical help if you develop fever, chills, cough, worsening leg swelling, or chest pain

## 2015-06-17 NOTE — Progress Notes (Signed)
   HPI  Patient presents today here with shortness of breath and hypertension.  Over the last week or so she's had increased episodes of shortness of breath, she's had these previously described a cardiology last March.  She describes the feeling of not being able take a complete breath. She denies any exertional dyspnea, persistent chest pain She describes 1 episode of momentary L sided/axiilary chest pain that lasted a few moments and self resolved without reason.   She denies fever, chills, cough, ear pain, abdominal pain, or wheezing. She is not a smoker.  She's only been taking her diuretic every once in a while, her last dose was Saturday  She states that her BP has been a bit labile, at times as high as [id number] systolic and mostly in the 0000000 systolic. Good med compliance No palpitations  PMH: Smoking status noted ROS: Per HPI  Objective: BP 127/60 mmHg  Pulse 63  Temp(Src) 97.6 F (36.4 C) (Oral)  Ht 5\' 5"  (1.651 m)  Wt 210 lb (95.255 kg)  BMI 34.95 kg/m2 Gen: NAD, alert, cooperative with exam HEENT: NCAT CV: RRR, good S1/S2, no murmur Resp: CTABL, no wheezes, non-labored Ext: No edema, warm Neuro: Alert and oriented, No gross deficits  Assessment and plan:  # Shortness of breath, elevated blood pressure No unifying diagnosis, her blood pressure is well controlled on exam today Previous EF WNL, grade 1 diastolic dysfunction, No Hx of CAD No increase in antihypertensives as I'm worried I will cause hypotension Her shortness of breath is difficult to explain, it is not associated with exertion and she does not have any concerns for infectious etiology Recommended daily diuretic for 1-2 weeks and watch for resolution Also discussed red flags for return or seeking emergency medical care which she understands easily She has cardiology  F/u scheduled in 3 weeks    Laroy Apple, MD [facility] 06/17/2015, 3:56 PM

## 2015-07-09 ENCOUNTER — Encounter: Payer: Self-pay | Admitting: Cardiology

## 2015-07-09 ENCOUNTER — Ambulatory Visit (INDEPENDENT_AMBULATORY_CARE_PROVIDER_SITE_OTHER): Payer: Medicare Other | Admitting: Cardiology

## 2015-07-09 VITALS — BP 130/56 | HR 68 | Ht 65.0 in | Wt 212.0 lb

## 2015-07-09 DIAGNOSIS — R002 Palpitations: Secondary | ICD-10-CM | POA: Diagnosis not present

## 2015-07-09 DIAGNOSIS — I10 Essential (primary) hypertension: Secondary | ICD-10-CM | POA: Diagnosis not present

## 2015-07-09 DIAGNOSIS — R600 Localized edema: Secondary | ICD-10-CM | POA: Diagnosis not present

## 2015-07-09 NOTE — Patient Instructions (Signed)
Your physician wants you to follow-up in: 1 year with Dr McDowell You will receive a reminder letter in the mail two months in advance. If you don't receive a letter, please call our office to schedule the follow-up appointment.    Your physician recommends that you continue on your current medications as directed. Please refer to the Current Medication list given to you today.     If you need a refill on your cardiac medications before your next appointment, please call your pharmacy.     Thank you for choosing [facility] !        

## 2015-07-09 NOTE — Progress Notes (Signed)
Cardiology Office Note  Date: 07/09/2015   ID: Kasiyah, Place 03/19/1936, MRN [id number]  PCP: Redge Gainer, MD  Primary Cardiologist: Rozann Lesches, MD   Chief Complaint  Patient presents with  . Cardiac follow-up  . History of palpitations    History of Present Illness: Jenna [name] is a 80 y.o. female last seen in March 2016. She presents for a routine follow-up visit. Since last evaluation she does not indicate any progressive sense of palpitations. She has intermittent trouble with shortness of breath, but generally reports NYHA class II symptoms. She has had no sudden dizziness or syncope. No exertional chest pain.  I reviewed her ECG done today which shows normal sinus rhythm with low voltage.  I reviewed her current medications which are outlined below. Cardiac regimen includes aspirin, Lasix that is used as needed and relatively infrequently, and Lopressor. She does state that her blood pressure has been "up and down." It does not sound like she has had hypotension however. She states that generally her systolics are no lower than the 130s to 140s.  Past Medical History  Diagnosis Date  . OA (osteoarthritis)   . Hypothyroidism   . Fibromyalgia   . Osteoporosis   . PUD (peptic ulcer disease)   . DDD (degenerative disc disease)   . Herpes zoster   . Palpitations   . Cataract   . Hyperlipidemia   . History of skin cancer   . Hypertension   . Skin cancer of nose     [name]    Current Outpatient Prescriptions  Medication Sig Dispense Refill  . aspirin EC 81 MG tablet Take 81 mg by mouth daily. Takes 2-3 times weekly    . Cholecalciferol (VITAMIN D3) 2000 UNITS TABS Take 1 tablet by mouth daily.      . colchicine 0.6 MG tablet TAKE 1 TABLET ONCE DAILY. 30 tablet 0  . fish oil-omega-3 fatty acids 1000 MG capsule Take 1 g by mouth daily.     . furosemide (LASIX) 20 MG tablet TAKE 1/2 TO 1 TABLET DAILY AS NEEDED. 30 tablet 5  . levothyroxine  (SYNTHROID, LEVOTHROID) 75 MCG tablet TAKE (1) TABLET BY MOUTH ONCE DAILY. 30 tablet 7  . metoprolol (LOPRESSOR) 50 MG tablet TAKE (1) TABLET BY MOUTH ONCE DAILY. 30 tablet 7  . PROCTOSOL HC 2.5 % rectal cream APPLY RECTALLY DAILY AS NEEDED FOR HEMORRHOIDS OR ITCHING. 28.35 g 5   No current facility-administered medications for this visit.   Allergies:  Cymbalta; Daypro; Diclofenac sodium; Effexor; Lipitor; Penicillins; Prozac; Sulfa antibiotics; Zocor; Amitriptyline; Milnacipran hcl; and Savella   Social History: The patient  reports that she has never smoked. She has never used smokeless tobacco. She reports that she does not drink alcohol or use illicit drugs.   ROS:  Please see the history of present illness. Otherwise, complete review of systems is positive for intermittent ankle edema.  All other systems are reviewed and negative.   Physical Exam: VS:  BP 130/56 mmHg  Pulse 68  Ht 5\' 5"  (1.651 m)  Wt 212 lb (96.163 kg)  BMI 35.28 kg/m2  SpO2 92%, BMI Body mass index is 35.28 kg/(m^2).  Wt Readings from Last 3 Encounters:  07/09/15 212 lb (96.163 kg)  06/17/15 210 lb (95.255 kg)  04/14/15 208 lb (94.348 kg)    General: Patient appears comfortable at rest. HEENT: Conjunctiva and lids normal, oropharynx clear with moist mucosa. Neck: Supple, no elevated JVP or carotid bruits,  no thyromegaly. Lungs: Clear to auscultation, nonlabored breathing at rest. Cardiac: Regular rate and rhythm, no S3 or significant systolic murmur, no pericardial rub. Abdomen: Soft, nontender, no hepatomegaly, bowel sounds present, no guarding or rebound. Extremities: Mild edema involving the ankles.  ECG: I personally reviewed the prior tracing from 01/21/2014 which showed normal sinus rhythm with nonspecific T-wave changes.  Recent Labwork: 07/15/2014: BNP 38.6 12/02/2014: Hemoglobin 13.0 04/14/2015: ALT 11; AST 14; BUN 15; Creatinine, Ser 0.61; Platelets 186; Potassium 4.7; Sodium 145*; TSH 1.990       Component Value Date/Time   CHOL 220* 04/14/2015 1023   TRIG 111 04/14/2015 1023   TRIG 113 10/16/2008 0540   HDL 65 04/14/2015 1023   HDL 48 10/16/2008 0540   CHOLHDL 3.6 10/16/2008 0540   VLDL 23 10/16/2008 0540   LDLCALC 133 12/02/2014   LDLCALC 138* 02/26/2014 1001    Other Studies Reviewed Today:  Lexiscan Cardiolite 02/15/2014: TECHNIQUE: Standard myocardial SPECT imaging was performed after resting intravenous injection of 10 mCi Tc-61m sestamibi. Subsequently, intravenous infusion of Lexiscan was performed under the supervision of the Cardiology staff. At peak effect of the drug, 30 mCi Tc-75m sestamibi was injected intravenously and standard myocardial SPECT imaging was performed. Quantitative gated imaging was also performed to evaluate left ventricular wall motion, and estimate left ventricular ejection fraction.  COMPARISON: None.  FINDINGS: ECG/stress data: The patient was stressed according to the Lexiscan protocol. The heart rate ranged from 59-86 beats per min. The blood pressure average 146/74. No chest pain was reported.  Baseline tracing demonstrated normal sinus rhythm with a nonspecific T-wave abnormality and leads V2 and V3. With stressed there noted arrhythmias nor any diagnostic ischaemic ST segment changes.  Perfusion: No decreased activity in the left ventricle on stress imaging to suggest reversible ischemia or infarction. Inferior and inferoseptal wall soft tissue attenuation artifact noted.  Wall Motion: Normal left ventricular wall motion. No left ventricular dilation.  Left Ventricular Ejection Fraction: 55 %  End diastolic volume 81 ml  End systolic volume 37 ml  IMPRESSION: 1. No reversible ischemia or infarction.  2. Normal left ventricular wall motion.  3. Left ventricular ejection fraction 55%  4. Low-risk stress test findings*.  Echocardiogram 08/20/2014: Study Conclusions  - Left ventricle: The cavity size  was normal. Wall thickness was increased in a pattern of mild LVH. Systolic function was normal. The estimated ejection fraction was in the range of 60% to 65%. Doppler parameters are consistent with abnormal left ventricular relaxation (grade 1 diastolic dysfunction). - Aortic valve: There was mild regurgitation. Valve area (VTI): 2.38 cm^2. Valve area (Vmax): 2.32 cm^2. - Atrial septum: No defect or patent foramen ovale was identified. - Systemic veins: IVC is dilated with normal respiratory variation, estimated RA pressure is 8 mmHg. - Technically difficult study.  Assessment and Plan:  1. History of palpitations. She reports no progression in symptoms. ECG reviewed and stable. Continue beta blocker and observation.  2. Intermittent ankle edema, she reports good control with as needed Lasix. Her weight is stable. Discussed her echocardiogram from last year which revealed LVEF [id number] and mild diastolic dysfunction.  3. Essential hypertension. Would continue to follow blood pressure with Dr. Laurance Flatten. No clear indication to further advance therapy at this time.  Current medicines were reviewed with the patient today.   Orders Placed This Encounter  Procedures  . EKG 12-Lead    Disposition: FU with me in 1 year.   Signed, Satira Sark, MD, [facility] 07/09/2015 9:40  AM    [facility] at [facility] [address] [place], [place] [address] Phone: [phone]; Fax: [phone]

## 2015-08-06 ENCOUNTER — Ambulatory Visit (INDEPENDENT_AMBULATORY_CARE_PROVIDER_SITE_OTHER): Payer: Medicare Other

## 2015-08-06 ENCOUNTER — Ambulatory Visit (INDEPENDENT_AMBULATORY_CARE_PROVIDER_SITE_OTHER): Payer: Medicare Other | Admitting: Family Medicine

## 2015-08-06 ENCOUNTER — Other Ambulatory Visit: Payer: Self-pay | Admitting: Family Medicine

## 2015-08-06 ENCOUNTER — Encounter: Payer: Self-pay | Admitting: Family Medicine

## 2015-08-06 VITALS — BP 141/62 | HR 69 | Temp 97.3°F | Ht 65.0 in | Wt 212.0 lb

## 2015-08-06 DIAGNOSIS — Z780 Asymptomatic menopausal state: Secondary | ICD-10-CM

## 2015-08-06 DIAGNOSIS — E559 Vitamin D deficiency, unspecified: Secondary | ICD-10-CM

## 2015-08-06 DIAGNOSIS — R7303 Prediabetes: Secondary | ICD-10-CM

## 2015-08-06 DIAGNOSIS — M797 Fibromyalgia: Secondary | ICD-10-CM

## 2015-08-06 DIAGNOSIS — Z889 Allergy status to unspecified drugs, medicaments and biological substances status: Secondary | ICD-10-CM | POA: Diagnosis not present

## 2015-08-06 DIAGNOSIS — E785 Hyperlipidemia, unspecified: Secondary | ICD-10-CM

## 2015-08-06 DIAGNOSIS — Z789 Other specified health status: Secondary | ICD-10-CM

## 2015-08-06 DIAGNOSIS — Z13820 Encounter for screening for osteoporosis: Secondary | ICD-10-CM

## 2015-08-06 DIAGNOSIS — I10 Essential (primary) hypertension: Secondary | ICD-10-CM | POA: Diagnosis not present

## 2015-08-06 DIAGNOSIS — Z1211 Encounter for screening for malignant neoplasm of colon: Secondary | ICD-10-CM

## 2015-08-06 DIAGNOSIS — E669 Obesity, unspecified: Secondary | ICD-10-CM | POA: Diagnosis not present

## 2015-08-06 DIAGNOSIS — R52 Pain, unspecified: Secondary | ICD-10-CM | POA: Diagnosis not present

## 2015-08-06 DIAGNOSIS — E66811 Obesity, class 1: Secondary | ICD-10-CM

## 2015-08-06 DIAGNOSIS — E782 Mixed hyperlipidemia: Secondary | ICD-10-CM

## 2015-08-06 DIAGNOSIS — G589 Mononeuropathy, unspecified: Secondary | ICD-10-CM

## 2015-08-06 DIAGNOSIS — E039 Hypothyroidism, unspecified: Secondary | ICD-10-CM | POA: Diagnosis not present

## 2015-08-06 NOTE — Patient Instructions (Addendum)
Medicare Annual Wellness Visit  [place] [facility] and the medical providers at [facility] strive to bring you the best medical care.  In doing so we not only want to address your current medical conditions and concerns but also to detect new conditions early and prevent illness, disease and health-related problems.    Medicare offers a yearly Wellness Visit which allows our clinical staff to assess your need for preventative services including immunizations, lifestyle education, counseling to decrease risk of preventable diseases and screening for fall risk and other medical concerns.    This visit is provided free of charge (no copay) for all Medicare recipients. The clinical pharmacists at [facility] have begun to conduct these Wellness Visits which will also include a thorough review of all your medications.    As you primary medical provider recommend that you make an appointment for your Annual Wellness Visit if you have not done so already this year.  You may set up this appointment before you leave today or you may call back [phone]) and schedule an appointment.  Please make sure when you call that you mention that you are scheduling your Annual Wellness Visit with the clinical pharmacist so that the appointment may be made for the proper length of time.     Continue current medications. Continue good therapeutic lifestyle changes which include good diet and exercise. Fall precautions discussed with patient. If an FOBT was given today- please return it to our front desk. If you are over 50 years old - you may need Prevnar 73 or the adult Pneumonia vaccine.  **Flu shots are available--- please call and schedule a FLU-CLINIC appointment**  After your visit with [place] today you will receive a survey in the mail or online from [employer] regarding your care with [place]. Please take a moment to fill this out. Your feedback is very  important to [place] as you can help [place] better understand your patient needs as well as improve your experience and satisfaction. WE CARE ABOUT YOU!!!   We will call with the results of the lab work x-rays and DEXA scan as soon as they become available Buddy taping the second toe to the third toe may help some of the pain that you're experiencing. Continue to monitor blood pressures at home and bring readings in for review at each visit Watch sodium intake Make every effort to keep yourself from falling by watching carefully were you're going and do not do any climbing The patient is scheduled to get her mammogram is month.

## 2015-08-06 NOTE — Progress Notes (Signed)
Subjective:    Patient ID: Jenna [name], female    DOB: September 17, 1935, 80 y.o.   MRN: [id number]  HPI Pt here for follow up and management of chronic medical problems which includes hypothyroid, hypertension and hyperlipidemia. He is taking medications regularly.The patient is doing well overall and complains with some swelling and redness and burning sensation in the left second toe. She is going to get a DEXA scan today and will be given an FOBT to return. She will also get her lab work drawn today. The patient sees Dr. Domenic Polite regularly because of her heart irregularity and recently saw him in February. According to her everything was good. She complains of some second left toe pain but mostly burning sensation. She has had no injury. This is been going on for a few weeks. She denies chest pain shortness of breath. She does have occasional trouble swallowing and even recently had trouble swallowing a thyroid pill. She has seen Dr. Wonda Amis for her colonoscopies and we'll contact him if the swallowing issues get worse. She denies any heartburn abdominal pain nausea vomiting diarrhea or blood in the stool. She is passing her water without problems. She has had some problems with her knees but these are doing much better since she has some injections by the orthopedist.     Patient Active Problem List   Diagnosis Date Noted  . Right knee pain 01/30/2015  . Hypertension   . Precordial pain 02/05/2014  . Palpitations 02/05/2014  . History of shingles 01/11/2014  . Pre-diabetes 11/21/2013  . Obesity (BMI 30.0-34.9) 11/21/2013  . Mixed hypercholesterolemia and hypertriglyceridemia 11/21/2013  . Metabolic syndrome 14/48/1856  . Multiple drug allergies 10/23/2013  . Statin intolerance 10/23/2013  . Osteopenia of the elderly 07/25/2013  . Vitamin D deficiency 06/19/2013  . Unspecified constipation 04/23/2013  . Rectal bleeding 04/23/2013  . Gout 02/21/2013  . Osteoarthritis 02/21/2013  .  Fibromyalgia   . Hyperlipidemia 11/14/2008   Outpatient Encounter Prescriptions as of 08/06/2015  Medication Sig  . aspirin EC 81 MG tablet Take 81 mg by mouth daily. Takes 2-3 times weekly  . Cholecalciferol (VITAMIN D3) 2000 UNITS TABS Take 1 tablet by mouth daily.    . colchicine 0.6 MG tablet TAKE 1 TABLET ONCE DAILY.  . fish oil-omega-3 fatty acids 1000 MG capsule Take 1 g by mouth daily.   . furosemide (LASIX) 20 MG tablet TAKE 1/2 TO 1 TABLET DAILY AS NEEDED.  [name] levothyroxine (SYNTHROID, LEVOTHROID) 75 MCG tablet TAKE (1) TABLET BY MOUTH ONCE DAILY.  . metoprolol (LOPRESSOR) 50 MG tablet TAKE (1) TABLET BY MOUTH ONCE DAILY.  [name] PROCTOSOL HC 2.5 % rectal cream APPLY RECTALLY DAILY AS NEEDED FOR HEMORRHOIDS OR ITCHING.   No facility-administered encounter medications on file as of 08/06/2015.      Review of Systems  Constitutional: Negative.   HENT: Negative.   Eyes: Negative.   Respiratory: Negative.   Cardiovascular: Negative.   Gastrointestinal: Negative.   Endocrine: Negative.   Genitourinary: Negative.   Musculoskeletal: Negative.   Skin: Negative.        Left 2nd toe - red, swollen, burns  Allergic/Immunologic: Negative.   Neurological: Negative.   Hematological: Negative.   Psychiatric/Behavioral: Negative.        Objective:   Physical Exam  Constitutional: She is oriented to person, place, and time. She appears well-developed and well-nourished. No distress.  HENT:  Head: Normocephalic and atraumatic.  Right Ear: External ear normal.  Left Ear: External  ear normal.  Nose: Nose normal.  Mouth/Throat: Oropharynx is clear and moist. No oropharyngeal exudate.  Eyes: Conjunctivae and EOM are normal. Pupils are equal, round, and reactive to light. Right eye exhibits no discharge. Left eye exhibits no discharge. No scleral icterus.  Neck: Normal range of motion. Neck supple. No thyromegaly present.  No carotid bruits thyromegaly or anterior cervical adenopathy    Cardiovascular: Normal rate, normal heart sounds and intact distal pulses.   No murmur heard. The heart was slightly irregular. The rate was 60/m.  Pulmonary/Chest: Effort normal and breath sounds normal. No respiratory distress. She has no wheezes. She has no rales. She exhibits no tenderness.  Clear anteriorly and posteriorly  Abdominal: Soft. Bowel sounds are normal. She exhibits no mass. There is no tenderness. There is no rebound and no guarding.  The abdomen was slightly tender. She does have a midline scar. There was no liver or spleen enlargement and no inguinal adenopathy.  Musculoskeletal: Normal range of motion. She exhibits no edema or tenderness.  The second toe DIP joint was slightly tender and slightly swollen and slightly red or in the second toe of the right foot. There is fairly good mobility. There was no rash.  Lymphadenopathy:    She has no cervical adenopathy.  Neurological: She is alert and oriented to person, place, and time. She has normal reflexes. No cranial nerve deficit.  Skin: Skin is warm and dry. Rash noted.  Psychiatric: She has a normal mood and affect. Her behavior is normal. Judgment and thought content normal.  Nursing note and vitals reviewed.  BP 141/62 mmHg  Pulse 69  Temp(Src) 97.3 F (36.3 C) (Oral)  Ht 5' 5" (1.651 m)  Wt 212 lb (96.163 kg)  BMI 35.28 kg/m2  [facility] reading (PRIMARY) by  Dr.Lucerito Rosinski-left foot second toe. DEXA scan  --- pending                                     Assessment & Plan:  1. Hyperlipidemia -The patient should continue with aggressive therapeutic lifestyle changes as she is statin intolerant - CBC with Differential/Platelet - NMR, lipoprofile  2. Essential hypertension -The blood pressure is slightly elevated today but we will make no change in treatment. She should continue to watch her salt intake and take her current medication - BMP8+EGFR - CBC with Differential/Platelet - Hepatic function panel  3. Vitamin  D deficiency -Continue vitamin D replacement pending results of lab work - CBC with Differential/Platelet - VITAMIN D 25 Hydroxy (Vit-D Deficiency, Fractures) - DG Bone Density; Future  4. Hypothyroidism, unspecified hypothyroidism type -Continue current thyroid replacement pending results of lab work - CBC with Differential/Platelet - Thyroid Panel With TSH  5. Fibromyalgia -She has no complaints today with her fibromyalgia - CBC with Differential/Platelet  6. Statin intolerance -Continue with aggressive therapeutic lifestyle changes - CBC with Differential/Platelet  7. Pre-diabetes -Continue with making all efforts at walking and exercising and losing weight through watching her diet and drinking more water - CBC with Differential/Platelet  8. Obesity (BMI 30.0-34.9) -Continue with weight loss measures - CBC with Differential/Platelet  9. Mixed hypercholesterolemia and hypertriglyceridemia -Continue with aggressive therapeutic lifestyle changes as she is statin intolerant - CBC with Differential/Platelet  10. Special screening for malignant neoplasms, colon -Return the FOBT. Her last colonoscopy was January 2015 - CBC with Differential/Platelet - Fecal occult blood, imunochemical; Future  11. Screening for  osteoporosis -DEXA scan today - CBC with Differential/Platelet - DG Bone Density; Future  12. Postmenopausal -Continue calcium and vitamin D - CBC with Differential/Platelet - DG Bone Density; Future  13. Mononeuropathy -X-rays of foot to look at second toe and the burning sensation that she is having there. Consider buddy taping the second toe to the third toe.  Patient Instructions                       Medicare Annual Wellness Visit  [facility] and the medical providers at [facility] strive to bring you the best medical care.  In doing so we not only want to address your current medical conditions and concerns but also to detect new  conditions early and prevent illness, disease and health-related problems.    Medicare offers a yearly Wellness Visit which allows our clinical staff to assess your need for preventative services including immunizations, lifestyle education, counseling to decrease risk of preventable diseases and screening for fall risk and other medical concerns.    This visit is provided free of charge (no copay) for all Medicare recipients. The clinical pharmacists at [facility] have begun to conduct these Wellness Visits which will also include a thorough review of all your medications.    As you primary medical provider recommend that you make an appointment for your Annual Wellness Visit if you have not done so already this year.  You may set up this appointment before you leave today or you may call back ([phone]) and schedule an appointment.  Please make sure when you call that you mention that you are scheduling your Annual Wellness Visit with the clinical pharmacist so that the appointment may be made for the proper length of time.     Continue current medications. Continue good therapeutic lifestyle changes which include good diet and exercise. Fall precautions discussed with patient. If an FOBT was given today- please return it to our front desk. If you are over 50 years old - you may need Prevnar 13 or the adult Pneumonia vaccine.  **Flu shots are available--- please call and schedule a FLU-CLINIC appointment**  After your visit with us today you will receive a survey in the mail or online from [employer] regarding your care with us. Please take a moment to fill this out. Your feedback is very important to us as you can help us better understand your patient needs as well as improve your experience and satisfaction. WE CARE ABOUT YOU!!!   We will call with the results of the lab work x-rays and DEXA scan as soon as they become available Buddy taping the second toe to the  third toe may help some of the pain that you're experiencing. Continue to monitor blood pressures at home and bring readings in for review at each visit Watch sodium intake Make every effort to keep yourself from falling by watching carefully were you're going and do not do any climbing The patient is scheduled to get her mammogram is month.   Don W. Moore MD   

## 2015-08-07 LAB — CBC WITH DIFFERENTIAL/PLATELET
BASOS ABS: 0 10*3/uL (ref 0.0–0.2)
Basos: 0 %
EOS (ABSOLUTE): 0.1 10*3/uL (ref 0.0–0.4)
EOS: 2 %
Hematocrit: 39.9 % (ref 34.0–46.6)
Hemoglobin: 13.2 g/dL (ref 11.1–15.9)
Immature Grans (Abs): 0 10*3/uL (ref 0.0–0.1)
Immature Granulocytes: 0 %
LYMPHS ABS: 1.2 10*3/uL (ref 0.7–3.1)
Lymphs: 21 %
MCH: 32.1 pg (ref 26.6–33.0)
MCHC: 33.1 g/dL (ref 31.5–35.7)
MCV: 97 fL (ref 79–97)
MONOS ABS: 0.5 10*3/uL (ref 0.1–0.9)
Monocytes: 8 %
NEUTROS PCT: 69 %
Neutrophils Absolute: 4.2 10*3/uL (ref 1.4–7.0)
PLATELETS: 186 10*3/uL (ref 150–379)
RBC: 4.11 x10E6/uL (ref 3.77–5.28)
RDW: 14.2 % (ref 12.3–15.4)
WBC: 6.1 10*3/uL (ref 3.4–10.8)

## 2015-08-07 LAB — HEPATIC FUNCTION PANEL
ALBUMIN: 4.3 g/dL (ref 3.5–4.8)
ALT: 14 IU/L (ref 0–32)
AST: 14 IU/L (ref 0–40)
Alkaline Phosphatase: 93 IU/L (ref 39–117)
Bilirubin Total: 0.7 mg/dL (ref 0.0–1.2)
Bilirubin, Direct: 0.17 mg/dL (ref 0.00–0.40)
Total Protein: 6.5 g/dL (ref 6.0–8.5)

## 2015-08-07 LAB — BMP8+EGFR
BUN / CREAT RATIO: 21 (ref 11–26)
BUN: 13 mg/dL (ref 8–27)
CHLORIDE: 101 mmol/L (ref 96–106)
CO2: 27 mmol/L (ref 18–29)
Calcium: 8.9 mg/dL (ref 8.7–10.3)
Creatinine, Ser: 0.62 mg/dL (ref 0.57–1.00)
GFR calc Af Amer: 100 mL/min/{1.73_m2} (ref >59)
GFR calc non Af Amer: 87 mL/min/{1.73_m2} (ref >59)
GLUCOSE: 100 mg/dL — AB (ref 65–99)
POTASSIUM: 4.3 mmol/L (ref 3.5–5.2)
SODIUM: 143 mmol/L (ref 134–144)

## 2015-08-07 LAB — NMR, LIPOPROFILE
Cholesterol: 229 mg/dL — ABNORMAL HIGH (ref 100–199)
HDL Cholesterol by NMR: 57 mg/dL (ref >39)
HDL PARTICLE NUMBER: 33.2 umol/L (ref >=30.5)
LDL Particle Number: 1874 nmol/L — ABNORMAL HIGH (ref <1000)
LDL Size: 20.8 nm (ref >20.5)
LDL-C: 139 mg/dL — AB (ref 0–99)
LP-IR SCORE: 56 — AB (ref <=45)
Small LDL Particle Number: 533 nmol/L — ABNORMAL HIGH (ref <=527)
Triglycerides by NMR: 164 mg/dL — ABNORMAL HIGH (ref 0–149)

## 2015-08-07 LAB — THYROID PANEL WITH TSH
FREE THYROXINE INDEX: 3.4 (ref 1.2–4.9)
T3 UPTAKE RATIO: 28 % (ref 24–39)
T4 TOTAL: 12.3 ug/dL — AB (ref 4.5–12.0)
TSH: 2.22 u[IU]/mL (ref 0.450–4.500)

## 2015-08-07 LAB — VITAMIN D 25 HYDROXY (VIT D DEFICIENCY, FRACTURES): Vit D, 25-Hydroxy: 36.9 ng/mL (ref 30.0–100.0)

## 2015-08-11 ENCOUNTER — Telehealth: Payer: Self-pay | Admitting: Family Medicine

## 2015-08-12 MED ORDER — ROSUVASTATIN CALCIUM 5 MG PO TABS: ORAL_TABLET | ORAL | Status: DC

## 2015-08-12 NOTE — Telephone Encounter (Signed)
Patient aware of results.

## 2015-08-12 NOTE — Addendum Note (Signed)
Addended by: Thana Ates on: 08/12/2015 08:41 AM   Modules accepted: Orders

## 2015-08-14 ENCOUNTER — Ambulatory Visit (INDEPENDENT_AMBULATORY_CARE_PROVIDER_SITE_OTHER): Payer: Medicare Other | Admitting: Pharmacist

## 2015-08-14 VITALS — Ht 65.0 in | Wt 212.0 lb

## 2015-08-14 DIAGNOSIS — M8580 Other specified disorders of bone density and structure, unspecified site: Secondary | ICD-10-CM | POA: Diagnosis not present

## 2015-08-14 NOTE — Progress Notes (Signed)
Patient ID: BECKETT VECCHIARELLI, female   DOB: [birth year], 80 y.o.   MRN: [id number]  [facility] Current Height: Height: 5\' 5"  (165.1 cm)      Max Lifetime Height:  5\' 6"  Current Weight: Weight: 212 lb (96.163 kg)       Ethnicity:Caucasian    HPI: Does pt already have a diagnosis of:  Osteopenia?  Yes Osteoporosis?  No  Back Pain?  Yes       Kyphosis?  No Prior fracture?  No Med(s) for Osteoporosis/Osteopenia:  Vitamin D Med(s) previously tried for Osteoporosis/Osteopenia:  none                                                             PMH: Age at menopause:  Surgical 30's Hysterectomy?  Yes Oophorectomy?  No HRT? Yes - Former.  Type/duration: premarin for over 10 years Steroid Use?  No Thyroid med?  Yes History of cancer?  No History of digestive disorders (ie Crohn's)?  No Current or previous eating disorders?  No Last Vitamin D Result:  36.9 (08/06/2015) Last GFR Result:  87 (08/06/2015)   FH/SH: Family history of osteoporosis?  Yes - mother Parent with history of hip fracture?  No Family history of breast cancer?  Yes - paternal GM Exercise?  No Smoking?  No Alcohol?  No    Calcium Assessment Calcium Intake  # of servings/day  Calcium mg  Almond Milk (8 oz) 1  x  400  = 400mg   Yogurt (4 oz) 0 x  200 = 0  Cheese (1 oz) 1 X 200mg  200mg   Other Calcium sources   250mg   Ca supplement 0 = 0   Estimated calcium intake per day 850mg     DEXA Results Date of Test T-Score for AP Spine L1-L4 T-Score for Total Left Hip T-Score for Total Right Hip T-score for neck of left hip T-Score for neck of right hip  08/06/2015 1.3 -1.1 -1.2 -1.6 -1.6  07/25/2013 1.4 -1.0 -1.0 -1.2 -1.3  12/09/2010 1.2 -1.0 -0.9 -1.3 -1.1  08/28/2008 0.8 -0.7 -0.6 -1.7 -0.9         **lowest T-Score was in 08/28/2008 at the right femur neck - was -1.7**  FRAX 10 year estimate: Total FX risk:  12%  (consider medication if >/= 20%) Hip FX risk:  2.5%  (consider medication if >/=  3%)  Assessment: Stable osteopenia  Recommendations: 1.  Discussed results of DEXA and fracture risk 2.  recommend calcium 1200mg  daily through supplementation or diet.  3.  recommend weight bearing exercise - 30 minutes at least 4 days per week.   4.  Counseled and educated about fall risk and prevention.  Recheck DEXA:  2 years   Time spent counseling patient:  20 minutes  Cherre Robins, PharmD, CPP

## 2015-08-14 NOTE — Patient Instructions (Signed)
Calcium & Vitamin D: The Facts  Why is calcium and vitamin D consumption important? Calcium: . Most Americans do not consume adequate amounts of calcium! Calcium is required for proper muscle function, nerve communication, bone support, and many other functions in the body.  . The body uses bones as a source of calcium. Bones 'remodel' themselves continuously - the body constantly breaks bone down to release calcium and rebuilds bones by replacing calcium in the bone later.  . As we get older, the rate of bone breakdown occurs faster than bone rebuilding which could lead to osteopenia, osteoporosis, and possible fractures.   Vitamin D: . People naturally make vitamin D in the body when sunlight hits the skin and triggers a process that leads to vitamin D production. This natural vitamin D production requires about 10-15 minutes of sun exposure on the hands, arms, and face at least 2-3 times per week. However, due to decreased sun exposure and the use of sunscreen, most people will need to get additional vitamin D from foods or supplements. Your doctor can measure your body's vitamin D level through a simple blood test to determine your daily vitamin D needs.  . Vitamin D is used to help the body absorb calcium, maintain bone health, help the immune system, and reduce inflammation. It also plays a role in muscle performance, balance and risk of falling.  . Vitamin D deficiency can lead to osteomalacia or softening of the bones, bone pain, and muscle weakness.   The recommended daily allowance of Calcium and Vitamin D varies for different age groups. Age group Calcium (mg) Vitamin D (IU)  Females and Males: Age 55-50 1000 mg 600 IU  Females: Age 28- 86 1200 mg 600 IU  Males: Age 30-70 1000 mg 600 IU  Females and Males: Age 54+ 1200 mg 800 IU  Pregnant/lactating Females age 82-50 1000 mg 600 IU   How much Calcium do you get in your diet? Calcium Intake # of servings per day  Total calcium (mg)   Skim milk, 2% milk (1 cup) _________ x 300 mg   Yogurt (1 small container) _________ x 200 mg   Cheese (1oz) _________ x 200 mg   Cottage Cheese (1 cup)             ________ x 150 mg   Almond milk (1 cup) _________ x 450 mg   Fortified Orange Juice (1 cup) _________ x 300 mg   Broccoli or spinach ( 1 cup) _________ x 100 mg   Salmon (3 oz) _________ x 150 mg    Almonds (1/4 cup) _______ x 90 mg      How do we get Calcium and Vitamin D in our diet? Calcium: . Obtaining calcium from the diet is the most preferred way to reach the recommended daily goal. If this goal is not reached through diet, calcium supplements are available.  . Calcium is found in many foods including: dairy products, dark leafy vegetables (like broccoli, kale, and spinach), fish, and fortified products like juices and cereals.  . The food label will have a %DV (percent daily value) listed showing the amount of calcium per serving. To determine the total mg per serving, simply replace the % with zero (0).  For example, Almond Breeze almond milk contains 45% DV of calcium or 4107m per 1 cup.  . You can increase the amount of calcium in your diet by using more calcium products in your daily meals. Use yogurt and fruit to  make smoothies or use yogurt to top baked potatoes or make whipped potatoes. Sprinkle low fat cheese onto salads or into egg white omelets. You can even add non-fat dry milk powder (300mg calcium per 1/3 cup) to hot cereals, meat loaf, soups, or potatoes.  . Calcium supplements come in many forms including tablets, chewables, and gummies. Be sure to read the label to determine the correct number of tablets per serving and whether or not to take the supplement with food.  . Calcium carbonate products (Oscal, Caltrate, and Viactiv) are generally better absorbed when taken with food while calcium citrate products like Citracal can be taken with or without food.  . The body can only absorb about 600 mg of calcium  at one time. It is recommended to take calcium supplements in small amounts several times per day.  However, taking it all at once is better than not taking it at all. . Increasing your intake of calcium is essential for bone health, but may also lead to some side effects like constipation, increased gas, bloating or abdominal cramping. To help reduce these side effects, start with 1 tablet per day and slowly increase your intake of the supplement to the recommended doses. It is also recommended that you drink plenty of water each day. Vitamin D: . Very few foods naturally contain vitamin D. However, it is found in saltwater fish (like tuna, salmon and mackerel), beef liver, egg yolks, cheese and vitamin D fortified foods (like yogurt, cereals, orange juice and milk) . The amount of vitamin D in each food or product is listed as %DV on the product label. To determine the total amount of vitamin D per serving, drop the % sign and multiply the number by 4. For example, 1 cup of Almond Breeze almond milk contains 25% DV vitamin D or 100 IU per serving (25 x 4 =100). . Vitamin D is also found in multivitamins and supplements and may be listed as ergocalciferol (vitamin D2) or cholecalciferol (vitamin D3). Each of these forms of vitamin D are equivalent and the daily recommended intake will vary based on your age and the vitamin D levels in your body. Follow your doctor's recommendation for vitamin D intake.                    Exercise for Strong Bones  Exercise is important to build and maintain strong bones / bone density.  There are 2 types of exercises that are important to building and maintaining strong bones:  Weight- bearing and muscle-stregthening.  Weight-bearing Exercises  These exercises include activities that make you move against gravity while staying upright. Weight-bearing exercises can be high-impact or low-impact.  High-impact weight-bearing exercises help build bones and keep them  strong. If you have broken a bone due to osteoporosis or are at risk of breaking a bone, you may need to avoid high-impact exercises. If you're not sure, you should check with your healthcare provider.  Examples of high-impact weight-bearing exercises are: Dancing  Doing high-impact aerobics  Hiking  Jogging/running  Jumping Rope  Stair climbing  Tennis  Low-impact weight-bearing exercises can also help keep bones strong and are a safe alternative if you cannot do high-impact exercises.   Examples of low-impact weight-bearing exercises are: Using elliptical training machines  Doing low-impact aerobics  Using stair-step machines  Fast walking on a treadmill or outside   Muscle-Strengthening Exercises These exercises include activities where you move your body, a weight or some other resistance   against gravity. They are also known as resistance exercises and include: Lifting weights  Using elastic exercise bands  Using weight machines  Lifting your own body weight  Functional movements, such as standing and rising up on your toes  Yoga and Pilates can also improve strength, balance and flexibility. However, certain positions may not be safe for people with osteoporosis or those at increased risk of broken bones. For example, exercises that have you bend forward may increase the chance of breaking a bone in the spine.   Non-Impact Exercises There are other types of exercises that can help prevent falls.  Non-impact exercises can help you to improve balance, posture and how well you move in everyday activities. Some of these exercises include: Balance exercises that strengthen your legs and test your balance, such as Tai Chi, can decrease your risk of falls.  Posture exercises that improve your posture and reduce rounded or "sloping" shoulders can help you decrease the chance of breaking a bone, especially in the spine.  Functional exercises that improve how well you move can help you  with everyday activities and decrease your chance of falling and breaking a bone. For example, if you have trouble getting up from a chair or climbing stairs, you should do these activities as exercises.   **A physical therapist can teach you balance, posture and functional exercises. He/she can also help you learn which exercises are safe and appropriate for you.  [facility] has a physical therapy office in [place] in front of our office and referrals can be made for assessments and treatment as needed and strength and balance training.  If you would like to have an assessment with [facility] and our physical therapy team please let a nurse or provider know.   Fall Prevention in the Home  Falls can cause injuries and can affect people from all age groups. There are many simple things that you can do to make your home safe and to help prevent falls. WHAT CAN I DO ON THE OUTSIDE OF MY HOME?  Regularly repair the edges of walkways and driveways and fix any cracks.  Remove high doorway thresholds.  Trim any shrubbery on the main path into your home.  Use bright outdoor lighting.  Clear walkways of debris and clutter, including tools and rocks.  Regularly check that handrails are securely fastened and in good repair. Both sides of any steps should have handrails.  Install guardrails along the edges of any raised decks or porches.  Have leaves, snow, and ice cleared regularly.  Use sand or salt on walkways during winter months.  In the garage, clean up any spills right away, including grease or oil spills. WHAT CAN I DO IN THE BATHROOM?  Use night lights.  Install grab bars by the toilet and in the tub and shower. Do not use towel bars as grab bars.  Use non-skid mats or decals on the floor of the tub or shower.  If you need to sit down while you are in the shower, use a plastic, non-slip stool.[name]  Keep the floor dry. Immediately clean up any water that spills on the floor.  Remove  soap buildup in the tub or shower on a regular basis.  Attach bath mats securely with double-sided non-slip rug tape.  Remove throw rugs and other tripping hazards from the floor. WHAT CAN I DO IN THE BEDROOM?  Use night lights.  Make sure that a bedside light is easy to reach.  Do not  use oversized bedding that drapes onto the floor.  Have a firm chair that has side arms to use for getting dressed.  Remove throw rugs and other tripping hazards from the floor. WHAT CAN I DO IN THE KITCHEN?   Clean up any spills right away.  Avoid walking on wet floors.  Place frequently used items in easy-to-reach places.  If you need to reach for something above you, use a sturdy step stool that has a grab bar.  Keep electrical cables out of the way.  Do not use floor polish or wax that makes floors slippery. If you have to use wax, make sure that it is non-skid floor wax.  Remove throw rugs and other tripping hazards from the floor. WHAT CAN I DO IN THE STAIRWAYS?  Do not leave any items on the stairs.  Make sure that there are handrails on both sides of the stairs. Fix handrails that are broken or loose. Make sure that handrails are as long as the stairways.  Check any carpeting to make sure that it is firmly attached to the stairs. Fix any carpet that is loose or worn.  Avoid having throw rugs at the top or bottom of stairways, or secure the rugs with carpet tape to prevent them from moving.  Make sure that you have a light switch at the top of the stairs and the bottom of the stairs. If you do not have them, have them installed. WHAT ARE SOME OTHER FALL PREVENTION TIPS?  Wear closed-toe shoes that fit well and support your feet. Wear shoes that have rubber soles or low heels.  When you use a stepladder, make sure that it is completely opened and that the sides are firmly locked. Have someone hold the ladder while you are using it. Do not climb a closed stepladder.  Add color or  contrast paint or tape to grab bars and handrails in your home. Place contrasting color strips on the first and last steps.  Use mobility aids as needed, such as canes, walkers, scooters, and crutches.  Turn on lights if it is dark. Replace any light bulbs that burn out.  Set up furniture so that there are clear paths. Keep the furniture in the same spot.  Fix any uneven floor surfaces.  Choose a carpet design that does not hide the edge of steps of a stairway.  Be aware of any and all pets.  Review your medicines with your healthcare provider. Some medicines can cause dizziness or changes in blood pressure, which increase your risk of falling. Talk with your health care provider about other ways that you can decrease your risk of falls. This may include working with a physical therapist or trainer to improve your strength, balance, and endurance.   This information is not intended to replace advice given to you by your health care provider. Make sure you discuss any questions you have with your health care provider.   Document Released: 04/30/2002 Document Revised: 09/24/2014 Document Reviewed: 06/14/2014 Elsevier Interactive Patient Education [employer].

## 2015-08-22 ENCOUNTER — Encounter: Payer: Medicare Other | Admitting: *Deleted

## 2015-10-21 ENCOUNTER — Encounter: Payer: Medicare Other | Admitting: *Deleted

## 2015-10-21 DIAGNOSIS — Z1231 Encounter for screening mammogram for malignant neoplasm of breast: Secondary | ICD-10-CM | POA: Diagnosis not present

## 2015-11-10 ENCOUNTER — Other Ambulatory Visit: Payer: Self-pay | Admitting: Family Medicine

## 2015-12-10 ENCOUNTER — Other Ambulatory Visit: Payer: Self-pay | Admitting: Family Medicine

## 2015-12-11 ENCOUNTER — Other Ambulatory Visit: Payer: Self-pay | Admitting: Family Medicine

## 2015-12-11 NOTE — Telephone Encounter (Signed)
Chest congestion, tightness, dry cough, started yesterday Denies fever Wants RX for Zpac Please review and advise

## 2015-12-11 NOTE — Telephone Encounter (Signed)
I did not see patient yesterday. You may call in a Z-Pak. If she does not get better please have her come to the office to be checked.

## 2015-12-12 ENCOUNTER — Telehealth: Payer: Self-pay | Admitting: Family Medicine

## 2015-12-12 MED ORDER — AZITHROMYCIN 250 MG PO TABS: ORAL_TABLET | ORAL | Status: DC

## 2015-12-12 NOTE — Telephone Encounter (Signed)
This is dup encounter 

## 2015-12-12 NOTE — Telephone Encounter (Signed)
This med was ordered on other encounter

## 2015-12-17 ENCOUNTER — Ambulatory Visit (INDEPENDENT_AMBULATORY_CARE_PROVIDER_SITE_OTHER): Payer: Medicare Other | Admitting: Family Medicine

## 2015-12-17 ENCOUNTER — Encounter: Payer: Self-pay | Admitting: Family Medicine

## 2015-12-17 VITALS — BP 135/65 | HR 63 | Temp 98.9°F | Resp 18 | Wt 210.0 lb

## 2015-12-17 DIAGNOSIS — J069 Acute upper respiratory infection, unspecified: Secondary | ICD-10-CM | POA: Diagnosis not present

## 2015-12-17 MED ORDER — BENZONATATE 100 MG PO CAPS: 100.0000 mg | ORAL_CAPSULE | Freq: Two times a day (BID) | ORAL | 0 refills | Status: DC | PRN

## 2015-12-17 MED ORDER — FLUTICASONE PROPIONATE 50 MCG/ACT NA SUSP: 1.0000 | Freq: Two times a day (BID) | NASAL | 6 refills | Status: DC | PRN

## 2015-12-17 NOTE — Progress Notes (Signed)
BP 135/65   Pulse 63   Temp 98.9 F (37.2 C) (Oral)   Resp 18   Wt 210 lb (95.3 kg)   BMI 34.95 kg/m    Subjective:    Patient ID: Jenna [name], female    DOB: 04-28-1936, 80 y.o.   MRN: [id number]  HPI: HIKMA FOLEY is a 80 y.o. female presenting on 12/17/2015 for Other (Chest congestion, cough, ear pain, chills - Just finished a Z-pack yesterday)   HPI Cough and congestion Patient has been having cough and congestion and chills and left ear pain that started 5 days ago. She finished a Z-Pak yesterday but feels like she is still having a lot of the cough and congestion. Her chills and the fever of 99 that she had before have resolved but she still having this hacking dry cough that she cannot get rid of. She denies any sick contacts that she knows of. She does not get this regularly. She is not a smoker and does not live with smokers.  Relevant past medical, surgical, family and social history reviewed and updated as indicated. Interim medical history since our last visit reviewed. Allergies and medications reviewed and updated.  Review of Systems  Constitutional: Negative for chills and fever.  HENT: Positive for congestion, postnasal drip, rhinorrhea, sinus pressure, sneezing and sore throat. Negative for ear discharge and ear pain.   Eyes: Negative for pain, redness and visual disturbance.  Respiratory: Positive for cough. Negative for chest tightness, shortness of breath and wheezing.   Cardiovascular: Negative for chest pain and leg swelling.  Genitourinary: Negative for difficulty urinating and dysuria.  Musculoskeletal: Negative for back pain and gait problem.  Skin: Negative for rash.  Neurological: Negative for light-headedness and headaches.  Psychiatric/Behavioral: Negative for agitation and behavioral problems.  All other systems reviewed and are negative.   Per HPI unless specifically indicated above     Medication List       Accurate as of 12/17/15   2:32 PM. Always use your most recent med list.          aspirin EC 81 MG tablet Take 81 mg by mouth daily. Takes 2-3 times weekly   benzonatate 100 MG capsule Commonly known as:  TESSALON Take 1 capsule (100 mg total) by mouth 2 (two) times daily as needed for cough.   colchicine 0.6 MG tablet TAKE 1 TABLET ONCE DAILY.   fish oil-omega-3 fatty acids 1000 MG capsule Take 1 g by mouth daily.   fluticasone 50 MCG/ACT nasal spray Commonly known as:  FLONASE Place 1 spray into both nostrils 2 (two) times daily as needed for allergies or rhinitis.   furosemide 20 MG tablet Commonly known as:  LASIX TAKE 1/2 TO 1 TABLET DAILY AS NEEDED.   levothyroxine 75 MCG tablet Commonly known as:  SYNTHROID, LEVOTHROID TAKE 1 TABLET ONCE DAILY.   metoprolol 50 MG tablet Commonly known as:  LOPRESSOR TAKE 1 TABLET ONCE DAILY.   PROCTOSOL HC 2.5 % rectal cream Generic drug:  hydrocortisone APPLY RECTALLY DAILY AS NEEDED FOR HEMORRHOIDS OR ITCHING.   rosuvastatin 5 MG tablet Commonly known as:  CRESTOR Patient to take 5mg  on Monday, Wednesday, and Friday   Vitamin D3 2000 units Tabs Take 1 tablet by mouth daily.          Objective:    BP 135/65   Pulse 63   Temp 98.9 F (37.2 C) (Oral)   Resp 18   Wt 210 lb (95.3  kg)   BMI 34.95 kg/m   Wt Readings from Last 3 Encounters:  12/17/15 210 lb (95.3 kg)  08/14/15 212 lb (96.2 kg)  08/06/15 212 lb (96.2 kg)    Physical Exam  Constitutional: She is oriented to person, place, and time. She appears well-developed and well-nourished. No distress.  HENT:  Right Ear: Tympanic membrane, external ear and ear canal normal.  Left Ear: Tympanic membrane, external ear and ear canal normal.  Nose: Mucosal edema and rhinorrhea present. No epistaxis. Right sinus exhibits no maxillary sinus tenderness and no frontal sinus tenderness. Left sinus exhibits no maxillary sinus tenderness and no frontal sinus tenderness.  Mouth/Throat: Uvula is  midline and mucous membranes are normal. Posterior oropharyngeal edema and posterior oropharyngeal erythema present. No oropharyngeal exudate or tonsillar abscesses.  Eyes: Conjunctivae and EOM are normal.  Neck: Neck supple. No thyromegaly present.  Cardiovascular: Normal rate, regular rhythm, normal heart sounds and intact distal pulses.   No murmur heard. Pulmonary/Chest: Effort normal and breath sounds normal. No respiratory distress. She has no wheezes. She has no rales. She exhibits no tenderness.  Musculoskeletal: Normal range of motion. She exhibits no edema or tenderness.  Lymphadenopathy:    She has no cervical adenopathy.  Neurological: She is alert and oriented to person, place, and time. Coordination normal.  Skin: Skin is warm and dry. No rash noted. She is not diaphoretic.  Psychiatric: She has a normal mood and affect. Her behavior is normal.  Nursing note and vitals reviewed.     Assessment & Plan:   Problem List Items Addressed This Visit    None    Visit Diagnoses    Viral upper respiratory infection    -  Primary   Relevant Medications   benzonatate (TESSALON) 100 MG capsule   fluticasone (FLONASE) 50 MCG/ACT nasal spray       Follow up plan: Return if symptoms worsen or fail to improve.  Counseling provided for all of the vaccine components No orders of the defined types were placed in this encounter.   Caryl Pina, MD [facility] 12/17/2015, 2:32 PM

## 2015-12-22 ENCOUNTER — Ambulatory Visit (INDEPENDENT_AMBULATORY_CARE_PROVIDER_SITE_OTHER): Payer: Medicare Other

## 2015-12-22 ENCOUNTER — Other Ambulatory Visit: Payer: Self-pay | Admitting: Nurse Practitioner

## 2015-12-22 ENCOUNTER — Ambulatory Visit (INDEPENDENT_AMBULATORY_CARE_PROVIDER_SITE_OTHER): Payer: Medicare Other | Admitting: Family Medicine

## 2015-12-22 ENCOUNTER — Encounter: Payer: Self-pay | Admitting: Family Medicine

## 2015-12-22 VITALS — BP 131/58 | HR 71 | Temp 97.7°F | Ht 65.0 in | Wt 209.0 lb

## 2015-12-22 DIAGNOSIS — R05 Cough: Secondary | ICD-10-CM

## 2015-12-22 DIAGNOSIS — M797 Fibromyalgia: Secondary | ICD-10-CM | POA: Diagnosis not present

## 2015-12-22 DIAGNOSIS — J069 Acute upper respiratory infection, unspecified: Secondary | ICD-10-CM

## 2015-12-22 DIAGNOSIS — E785 Hyperlipidemia, unspecified: Secondary | ICD-10-CM

## 2015-12-22 DIAGNOSIS — I10 Essential (primary) hypertension: Secondary | ICD-10-CM

## 2015-12-22 DIAGNOSIS — F329 Major depressive disorder, single episode, unspecified: Secondary | ICD-10-CM | POA: Diagnosis not present

## 2015-12-22 DIAGNOSIS — R0989 Other specified symptoms and signs involving the circulatory and respiratory systems: Secondary | ICD-10-CM

## 2015-12-22 DIAGNOSIS — E039 Hypothyroidism, unspecified: Secondary | ICD-10-CM | POA: Diagnosis not present

## 2015-12-22 DIAGNOSIS — E559 Vitamin D deficiency, unspecified: Secondary | ICD-10-CM

## 2015-12-22 DIAGNOSIS — R059 Cough, unspecified: Secondary | ICD-10-CM

## 2015-12-22 DIAGNOSIS — F32A Depression, unspecified: Secondary | ICD-10-CM

## 2015-12-22 MED ORDER — SERTRALINE HCL 50 MG PO TABS: 50.0000 mg | ORAL_TABLET | Freq: Every day | ORAL | 1 refills | Status: DC

## 2015-12-22 MED ORDER — BENZONATATE 100 MG PO CAPS: 100.0000 mg | ORAL_CAPSULE | Freq: Two times a day (BID) | ORAL | 0 refills | Status: DC | PRN

## 2015-12-22 NOTE — Progress Notes (Signed)
Subjective:    Patient ID: Jenna [name], female    DOB: 01-18-1936, 80 y.o.   MRN: [id number]  HPI Pt here for follow up and management of chronic medical problems which includes hyperlipidemia, hypothyroid, and hypertension. She is taking medications regularly.The patient has had some recent chest congestion and has finished a Z-Pak. She also complains of fatigue and lack of interest in life. The depression screening was done on her. She says that she has trouble falling asleep and staying asleep and feeling tired and having little energy. The patient denies any chest pain or shortness of breath other than what she has had with her respiratory infection recently. This respiratory problem has been going on for couple weeks and she has finished her Z-Pak. She denies any fever or colored sputum. She denies any wheezing. She has no trouble with her GI tract with swallowing heartburn indigestion nausea vomiting diarrhea blood in the stool or black tarry bowel movements. She is passing her water without problems. She continues to take [employer] for her cough and congestion.      Patient Active Problem List   Diagnosis Date Noted  . Right knee pain 01/30/2015  . Hypertension   . Precordial pain 02/05/2014  . Palpitations 02/05/2014  . History of shingles 01/11/2014  . Pre-diabetes 11/21/2013  . Obesity (BMI 30.0-34.9) 11/21/2013  . Mixed hypercholesterolemia and hypertriglyceridemia 11/21/2013  . Metabolic syndrome 66/10/3014  . Multiple drug allergies 10/23/2013  . Statin intolerance 10/23/2013  . Osteopenia of the elderly 07/25/2013  . Vitamin D deficiency 06/19/2013  . Unspecified constipation 04/23/2013  . Rectal bleeding 04/23/2013  . Gout 02/21/2013  . Osteoarthritis 02/21/2013  . Fibromyalgia   . Hyperlipidemia 11/14/2008   Outpatient Encounter Prescriptions as of 12/22/2015  Medication Sig  . aspirin EC 81 MG tablet Take 81 mg by mouth daily. Takes 2-3 times weekly  .  benzonatate (TESSALON) 100 MG capsule Take 1 capsule (100 mg total) by mouth 2 (two) times daily as needed for cough.  . Cholecalciferol (VITAMIN D3) 2000 UNITS TABS Take 1 tablet by mouth daily.    . colchicine 0.6 MG tablet TAKE 1 TABLET ONCE DAILY.  . fish oil-omega-3 fatty acids 1000 MG capsule Take 1 g by mouth daily.   . fluticasone (FLONASE) 50 MCG/ACT nasal spray Place 1 spray into both nostrils 2 (two) times daily as needed for allergies or rhinitis.  . furosemide (LASIX) 20 MG tablet TAKE 1/2 TO 1 TABLET DAILY AS NEEDED.  [name] levothyroxine (SYNTHROID, LEVOTHROID) 75 MCG tablet TAKE 1 TABLET ONCE DAILY.  . metoprolol (LOPRESSOR) 50 MG tablet TAKE 1 TABLET ONCE DAILY.  [name] PROCTOSOL HC 2.5 % rectal cream APPLY RECTALLY DAILY AS NEEDED FOR HEMORRHOIDS OR ITCHING.  . rosuvastatin (CRESTOR) 5 MG tablet Patient to take 85m on Monday, Wednesday, and Friday   No facility-administered encounter medications on file as of 12/22/2015.      Review of Systems  Constitutional: Positive for fatigue.  HENT: Positive for congestion.   Eyes: Negative.   Respiratory: Negative.   Cardiovascular: Negative.   Gastrointestinal: Negative.   Endocrine: Negative.   Genitourinary: Negative.   Musculoskeletal: Negative.   Skin: Negative.   Allergic/Immunologic: Negative.   Neurological: Negative.   Hematological: Negative.   Psychiatric/Behavioral: Negative.        Objective:   Physical Exam  Constitutional: She is oriented to person, place, and time. She appears well-developed and well-nourished. No distress.  HENT:  Head: Normocephalic and atraumatic.  Right Ear: External ear normal.  Left Ear: External ear normal.  Nose: Nose normal.  Mouth/Throat: Oropharynx is clear and moist.  Eyes: Conjunctivae and EOM are normal. Pupils are equal, round, and reactive to light. Right eye exhibits no discharge. Left eye exhibits no discharge. No scleral icterus.  Neck: Normal range of motion. Neck supple. No  thyromegaly present.  Cardiovascular: Normal rate, regular rhythm, normal heart sounds and intact distal pulses.   No murmur heard. The heart had a regular rate and rhythm at 72/m  Pulmonary/Chest: Effort normal and breath sounds normal. No respiratory distress. She has no wheezes. She has no rales. She exhibits no tenderness.  The lungs were clear anteriorly and posteriorly  Abdominal: Soft. Bowel sounds are normal. She exhibits no mass. There is no tenderness. There is no rebound and no guarding.  Nontender without liver or spleen enlargement and with normal bowel sounds and no bruits  Musculoskeletal: Normal range of motion. She exhibits no edema.  Lymphadenopathy:    She has no cervical adenopathy.  Neurological: She is alert and oriented to person, place, and time. She has normal reflexes. No cranial nerve deficit.  Skin: Skin is warm and dry. No rash noted.  Psychiatric: Her behavior is normal. Judgment and thought content normal.  The patient was overall fairly positive. She did indicate almost every day that she is sleeping too much and feeling tired and having little energy.  Nursing note and vitals reviewed.   BP (!) 131/58 (BP Location: Left Arm)   Pulse 71   Temp 97.7 F (36.5 C) (Oral)   Ht 5' 5"  (1.651 m)   Wt 209 lb (94.8 kg)   BMI 34.78 kg/m   [facility] reading (PRIMARY) by  Dr.Cristal Qadir-chest x-ray with results pending                                       Assessment & Plan:  1. Hyperlipidemia -Continue with Crestor and as aggressive therapeutic lifestyle changes as possible - CBC with Differential/Platelet - Hepatic function panel - NMR, lipoprofile  2. Hypothyroidism, unspecified hypothyroidism type -Continue with current thyroid medication pending results of lab work - CBC with Differential/Platelet - Thyroid Panel With TSH - Thyroid Panel With TSH  3. Vitamin D deficiency -Continue with vitamin D replacement pending results of lab work - CBC with  Differential/Platelet - VITAMIN D 25 Hydroxy (Vit-D Deficiency, Fractures)  4. Essential hypertension -The blood pressure is good today and she will continue with her beta blocker and furosemide. - BMP8+EGFR - CBC with Differential/Platelet - Hepatic function panel - DG Chest 2 View; Future  5. Fibromyalgia -Continue with Tylenol and stay active physically as possible - CBC with Differential/Platelet  6. Cough -Drink plenty of fluids, stay well hydrated, use nasal saline and Flonase and continue with Tessalon Perles as needed - DG Chest 2 View; Future  7. Viral upper respiratory infection - benzonatate (TESSALON) 100 MG capsule; Take 1 capsule (100 mg total) by mouth 2 (two) times daily as needed for cough.  Dispense: 20 capsule; Refill: 0  8. Chest congestion -Continue with Tessalon Perles hydration  9. Depression -Start Zoloft 50 mg, take one half nightly for 8 nights and then increase to one whole pill after that time. -Call in 3-4 weeks regarding progress and energy level. -We will check lab work including thyroid profile and hemoglobin to make sure all of this is  stable.  Patient Instructions                       Medicare Annual Wellness Visit  [facility] and the medical providers at [facility] strive to bring you the best medical care.  In doing so we not only want to address your current medical conditions and concerns but also to detect new conditions early and prevent illness, disease and health-related problems.    Medicare offers a yearly Wellness Visit which allows our clinical staff to assess your need for preventative services including immunizations, lifestyle education, counseling to decrease risk of preventable diseases and screening for fall risk and other medical concerns.    This visit is provided free of charge (no copay) for all Medicare recipients. The clinical pharmacists at [facility] have begun to conduct  these Wellness Visits which will also include a thorough review of all your medications.    As you primary medical provider recommend that you make an appointment for your Annual Wellness Visit if you have not done so already this year.  You may set up this appointment before you leave today or you may call back [phone]) and schedule an appointment.  Please make sure when you call that you mention that you are scheduling your Annual Wellness Visit with the clinical pharmacist so that the appointment may be made for the proper length of time.     Continue current medications. Continue good therapeutic lifestyle changes which include good diet and exercise. Fall precautions discussed with patient. If an FOBT was given today- please return it to our front desk. If you are over 36 years old - you may need Prevnar 47 or the adult Pneumonia vaccine.   After your visit with [place] today you will receive a survey in the mail or online from [employer] regarding your care with [place]. Please take a moment to fill this out. Your feedback is very important to [place] as you can help [place] better understand your patient needs as well as improve your experience and satisfaction. WE CARE ABOUT YOU!!!   We will call you with the results of the chest x-ray and lab work as soon as this becomes available You should take the antidepressant starting with a half of a pill nightly for about 8 nights and then go to 1 whole pill nightly To ask you to call us back in about 4 weeks as far as your progress and tell us about your energy level and fatigue at that time. Continue to drink plenty of fluids and stay well hydrated Continue with nasal saline and Tessalon Perles   Arrie Senate MD

## 2015-12-22 NOTE — Patient Instructions (Addendum)
Medicare Annual Wellness Visit  [facility] and the medical providers at [facility] strive to bring you the best medical care.  In doing so we not only want to address your current medical conditions and concerns but also to detect new conditions early and prevent illness, disease and health-related problems.    Medicare offers a yearly Wellness Visit which allows our clinical staff to assess your need for preventative services including immunizations, lifestyle education, counseling to decrease risk of preventable diseases and screening for fall risk and other medical concerns.    This visit is provided free of charge (no copay) for all Medicare recipients. The clinical pharmacists at [facility] have begun to conduct these Wellness Visits which will also include a thorough review of all your medications.    As you primary medical provider recommend that you make an appointment for your Annual Wellness Visit if you have not done so already this year.  You may set up this appointment before you leave today or you may call back [phone] and schedule an appointment.  Please make sure when you call that you mention that you are scheduling your Annual Wellness Visit with the clinical pharmacist so that the appointment may be made for the proper length of time.     Continue current medications. Continue good therapeutic lifestyle changes which include good diet and exercise. Fall precautions discussed with patient. If an FOBT was given today- please return it to our front desk. If you are over 73 years old - you may need Prevnar 24 or the adult Pneumonia vaccine.   After your visit with [place] today you will receive a survey in the mail or online from [employer] regarding your care with [place]. Please take a moment to fill this out. Your feedback is very important to [place] as you can help [place] better understand your patient needs as well as  improve your experience and satisfaction. WE CARE ABOUT YOU!!!   We will call you with the results of the chest x-ray and lab work as soon as this becomes available You should take the antidepressant starting with a half of a pill nightly for about 8 nights and then go to 1 whole pill nightly To ask you to call us back in about 4 weeks as far as your progress and tell us about your energy level and fatigue at that time. Continue to drink plenty of fluids and stay well hydrated Continue with nasal saline and Tessalon Perles

## 2015-12-23 LAB — NMR, LIPOPROFILE
Cholesterol: 196 mg/dL (ref 100–199)
HDL CHOLESTEROL BY NMR: 50 mg/dL (ref >39)
HDL Particle Number: 30.6 umol/L (ref >=30.5)
LDL PARTICLE NUMBER: 1694 nmol/L — AB (ref <1000)
LDL SIZE: 20.8 nm (ref >20.5)
LDL-C: 114 mg/dL — ABNORMAL HIGH (ref 0–99)
LP-IR Score: 65 — ABNORMAL HIGH (ref <=45)
Small LDL Particle Number: 654 nmol/L — ABNORMAL HIGH (ref <=527)
TRIGLYCERIDES BY NMR: 158 mg/dL — AB (ref 0–149)

## 2015-12-23 LAB — CBC WITH DIFFERENTIAL/PLATELET
BASOS: 0 %
Basophils Absolute: 0 10*3/uL (ref 0.0–0.2)
EOS (ABSOLUTE): 0.1 10*3/uL (ref 0.0–0.4)
Eos: 2 %
Hematocrit: 37.6 % (ref 34.0–46.6)
Hemoglobin: 12.9 g/dL (ref 11.1–15.9)
Immature Grans (Abs): 0 10*3/uL (ref 0.0–0.1)
Immature Granulocytes: 0 %
Lymphocytes Absolute: 1.6 10*3/uL (ref 0.7–3.1)
Lymphs: 24 %
MCH: 32.7 pg (ref 26.6–33.0)
MCHC: 34.3 g/dL (ref 31.5–35.7)
MCV: 95 fL (ref 79–97)
MONOS ABS: 0.5 10*3/uL (ref 0.1–0.9)
Monocytes: 7 %
NEUTROS ABS: 4.4 10*3/uL (ref 1.4–7.0)
Neutrophils: 67 %
PLATELETS: 206 10*3/uL (ref 150–379)
RBC: 3.94 x10E6/uL (ref 3.77–5.28)
RDW: 14.1 % (ref 12.3–15.4)
WBC: 6.6 10*3/uL (ref 3.4–10.8)

## 2015-12-23 LAB — HEPATIC FUNCTION PANEL
ALT: 10 IU/L (ref 0–32)
AST: 14 IU/L (ref 0–40)
Albumin: 4.2 g/dL (ref 3.5–4.8)
Alkaline Phosphatase: 95 IU/L (ref 39–117)
BILIRUBIN, DIRECT: 0.13 mg/dL (ref 0.00–0.40)
Bilirubin Total: 0.4 mg/dL (ref 0.0–1.2)
Total Protein: 6.3 g/dL (ref 6.0–8.5)

## 2015-12-23 LAB — BMP8+EGFR
BUN/Creatinine Ratio: 13 (ref 12–28)
BUN: 9 mg/dL (ref 8–27)
CO2: 26 mmol/L (ref 18–29)
Calcium: 9 mg/dL (ref 8.7–10.3)
Chloride: 97 mmol/L (ref 96–106)
Creatinine, Ser: 0.7 mg/dL (ref 0.57–1.00)
GFR, EST AFRICAN AMERICAN: 96 mL/min/{1.73_m2} (ref >59)
GFR, EST NON AFRICAN AMERICAN: 83 mL/min/{1.73_m2} (ref >59)
Glucose: 102 mg/dL — ABNORMAL HIGH (ref 65–99)
POTASSIUM: 4.5 mmol/L (ref 3.5–5.2)
SODIUM: 137 mmol/L (ref 134–144)

## 2015-12-23 LAB — THYROID PANEL WITH TSH
Free Thyroxine Index: 2.7 (ref 1.2–4.9)
T3 Uptake Ratio: 27 % (ref 24–39)
T4 TOTAL: 10.1 ug/dL (ref 4.5–12.0)
TSH: 2.07 u[IU]/mL (ref 0.450–4.500)

## 2015-12-23 LAB — VITAMIN D 25 HYDROXY (VIT D DEFICIENCY, FRACTURES): Vit D, 25-Hydroxy: 47.3 ng/mL (ref 30.0–100.0)

## 2015-12-28 NOTE — Progress Notes (Signed)
Please make sure that patient is aware of results

## 2016-01-06 DIAGNOSIS — Z961 Presence of intraocular lens: Secondary | ICD-10-CM | POA: Diagnosis not present

## 2016-01-12 ENCOUNTER — Telehealth: Payer: Self-pay | Admitting: Family Medicine

## 2016-01-12 NOTE — Telephone Encounter (Signed)
Pt aware of lab results & recommendations

## 2016-02-11 DIAGNOSIS — M1711 Unilateral primary osteoarthritis, right knee: Secondary | ICD-10-CM | POA: Diagnosis not present

## 2016-02-11 DIAGNOSIS — M170 Bilateral primary osteoarthritis of knee: Secondary | ICD-10-CM | POA: Diagnosis not present

## 2016-02-11 DIAGNOSIS — M1712 Unilateral primary osteoarthritis, left knee: Secondary | ICD-10-CM | POA: Diagnosis not present

## 2016-02-17 ENCOUNTER — Telehealth: Payer: Self-pay | Admitting: Family Medicine

## 2016-02-17 MED ORDER — AZITHROMYCIN 250 MG PO TABS: ORAL_TABLET | ORAL | 0 refills | Status: DC

## 2016-02-17 NOTE — Telephone Encounter (Signed)
Patient aware and meds sent to pharmacy.  

## 2016-02-26 ENCOUNTER — Ambulatory Visit: Payer: Medicare Other

## 2016-03-08 DIAGNOSIS — H26493 Other secondary cataract, bilateral: Secondary | ICD-10-CM | POA: Diagnosis not present

## 2016-03-29 DIAGNOSIS — Z961 Presence of intraocular lens: Secondary | ICD-10-CM | POA: Diagnosis not present

## 2016-03-30 ENCOUNTER — Emergency Department (HOSPITAL_COMMUNITY)
Admission: EM | Admit: 2016-03-30 | Discharge: 2016-03-30 | Disposition: A | Discharge status: Home / Self Care | Payer: Medicare Other | Attending: Emergency Medicine | Admitting: Emergency Medicine

## 2016-03-30 ENCOUNTER — Emergency Department (HOSPITAL_COMMUNITY): Payer: Medicare Other

## 2016-03-30 ENCOUNTER — Encounter (HOSPITAL_COMMUNITY): Payer: Self-pay | Admitting: *Deleted

## 2016-03-30 DIAGNOSIS — I4901 Ventricular fibrillation: Secondary | ICD-10-CM | POA: Diagnosis not present

## 2016-03-30 DIAGNOSIS — Z792 Long term (current) use of antibiotics: Secondary | ICD-10-CM | POA: Insufficient documentation

## 2016-03-30 DIAGNOSIS — I10 Essential (primary) hypertension: Secondary | ICD-10-CM | POA: Insufficient documentation

## 2016-03-30 DIAGNOSIS — I4891 Unspecified atrial fibrillation: Secondary | ICD-10-CM

## 2016-03-30 DIAGNOSIS — I480 Paroxysmal atrial fibrillation: Secondary | ICD-10-CM | POA: Diagnosis not present

## 2016-03-30 DIAGNOSIS — Z79899 Other long term (current) drug therapy: Secondary | ICD-10-CM | POA: Diagnosis not present

## 2016-03-30 DIAGNOSIS — R0602 Shortness of breath: Secondary | ICD-10-CM | POA: Insufficient documentation

## 2016-03-30 DIAGNOSIS — E039 Hypothyroidism, unspecified: Secondary | ICD-10-CM | POA: Insufficient documentation

## 2016-03-30 DIAGNOSIS — R002 Palpitations: Secondary | ICD-10-CM | POA: Diagnosis present

## 2016-03-30 DIAGNOSIS — Z7982 Long term (current) use of aspirin: Secondary | ICD-10-CM | POA: Insufficient documentation

## 2016-03-30 LAB — PROTIME-INR
INR: 0.93
PROTHROMBIN TIME: 12.4 s (ref 11.4–15.2)

## 2016-03-30 LAB — COMPREHENSIVE METABOLIC PANEL
ALBUMIN: 3.9 g/dL (ref 3.5–5.0)
ALT: 16 U/L (ref 14–54)
ANION GAP: 8 (ref 5–15)
AST: 17 U/L (ref 15–41)
Alkaline Phosphatase: 92 U/L (ref 38–126)
BUN: 10 mg/dL (ref 6–20)
CHLORIDE: 100 mmol/L — AB (ref 101–111)
CO2: 29 mmol/L (ref 22–32)
Calcium: 9.1 mg/dL (ref 8.9–10.3)
Creatinine, Ser: 0.65 mg/dL (ref 0.44–1.00)
Glucose, Bld: 126 mg/dL — ABNORMAL HIGH (ref 65–99)
POTASSIUM: 4.1 mmol/L (ref 3.5–5.1)
SODIUM: 137 mmol/L (ref 135–145)
Total Bilirubin: 0.6 mg/dL (ref 0.3–1.2)
Total Protein: 6.9 g/dL (ref 6.5–8.1)

## 2016-03-30 LAB — APTT: aPTT: 27 seconds (ref 24–36)

## 2016-03-30 LAB — CBC WITH DIFFERENTIAL/PLATELET
BASOS PCT: 0 %
Basophils Absolute: 0 10*3/uL (ref 0.0–0.1)
EOS ABS: 0.2 10*3/uL (ref 0.0–0.7)
Eosinophils Relative: 3 %
HEMATOCRIT: 41 % (ref 36.0–46.0)
HEMOGLOBIN: 14.1 g/dL (ref 12.0–15.0)
Lymphocytes Relative: 27 %
Lymphs Abs: 1.6 10*3/uL (ref 0.7–4.0)
MCH: 34.2 pg — ABNORMAL HIGH (ref 26.0–34.0)
MCHC: 34.4 g/dL (ref 30.0–36.0)
MCV: 99.5 fL (ref 78.0–100.0)
Monocytes Absolute: 0.5 10*3/uL (ref 0.1–1.0)
Monocytes Relative: 9 %
NEUTROS ABS: 3.7 10*3/uL (ref 1.7–7.7)
NEUTROS PCT: 61 %
Platelets: 175 10*3/uL (ref 150–400)
RBC: 4.12 MIL/uL (ref 3.87–5.11)
RDW: 13 % (ref 11.5–15.5)
WBC: 6 10*3/uL (ref 4.0–10.5)

## 2016-03-30 LAB — TROPONIN I

## 2016-03-30 LAB — BRAIN NATRIURETIC PEPTIDE: B NATRIURETIC PEPTIDE 5: 61 pg/mL (ref 0.0–100.0)

## 2016-03-30 MED ORDER — DILTIAZEM LOAD VIA INFUSION
10.0000 mg | Freq: Once | INTRAVENOUS | Status: DC
  Filled 2016-03-30: qty 10

## 2016-03-30 MED ORDER — DILTIAZEM HCL 100 MG IV SOLR
5.0000 mg/h | INTRAVENOUS | Status: DC
  Filled 2016-03-30: qty 100

## 2016-03-30 MED ORDER — RIVAROXABAN 20 MG PO TABS: 20.0000 mg | ORAL_TABLET | Freq: Every day | ORAL | 0 refills | Status: DC

## 2016-03-30 MED ORDER — HEPARIN BOLUS VIA INFUSION
4000.0000 [IU] | Freq: Once | INTRAVENOUS | Status: AC
  Administered 2016-03-30: 4000 [IU] via INTRAVENOUS

## 2016-03-30 MED ORDER — HEPARIN (PORCINE) IN NACL 100-0.45 UNIT/ML-% IJ SOLN
1100.0000 [IU]/h | INTRAMUSCULAR | Status: DC
  Administered 2016-03-30: 1100 [IU]/h via INTRAVENOUS
  Filled 2016-03-30: qty 250

## 2016-03-30 MED ORDER — RIVAROXABAN 20 MG PO TABS: 20.0000 mg | ORAL_TABLET | Freq: Every day | ORAL | Status: DC

## 2016-03-30 NOTE — ED Triage Notes (Signed)
Pt c/o being in bed when she states her throat felt "funny" and then she began to have heart palpitations; pt states she ate chili beans before going to bed

## 2016-03-30 NOTE — Progress Notes (Signed)
ANTICOAGULATION CONSULT NOTE - Preliminary  Pharmacy Consult for heparin Indication: atrial fibrillation  Allergies  Allergen Reactions  . Cymbalta [Duloxetine Hcl] Nausea Only  . Daypro [Oxaprozin]   . Diclofenac Sodium   . Effexor [Venlafaxine Hydrochloride]   . Lipitor [Atorvastatin Calcium] Other (See Comments)    Leg aches  . Penicillins   . Prozac [Fluoxetine Hcl]   . Sulfa Antibiotics   . Zocor [Simvastatin] Other (See Comments)    Hip pain  . Amitriptyline Rash  . Milnacipran Hcl Rash  . Savella [Milnacipran Hcl] Rash    Patient Measurements: Weight: 212 lb (96.2 kg)     Vital Signs: Temp: 98 F (36.7 C) (11/07 0032) Temp Source: Oral (11/07 0032) BP: 157/93 (11/07 0032) Pulse Rate: 115 (11/07 0032)  Labs: No results for input(s): HGB, HCT, PLT, APTT, LABPROT, INR, HEPARINUNFRC, CREATININE, CKTOTAL, CKMB, TROPONINI in the last 72 hours. CrCl cannot be calculated (Patient's most recent lab result is older than the maximum 21 days allowed.).  Medical History: Past Medical History:  Diagnosis Date  . Cataract   . DDD (degenerative disc disease)   . Fibromyalgia   . Herpes zoster   . History of skin cancer   . Hyperlipidemia   . Hypertension   . Hypothyroidism   . OA (osteoarthritis)   . Osteoporosis   . Palpitations   . PUD (peptic ulcer disease)   . Skin cancer of nose    Ulani Degrasse Gann    Medications:   (Not in a hospital admission) Scheduled:  . diltiazem  10 mg Intravenous Once   Infusions:  . diltiazem (CARDIZEM) infusion     PRN:  Anti-infectives    None      Assessment: 80 yo female in ED c/o palpitations. Hx of a. Fib. Pt is only on ASA 2-3 times per week at home. No bruising/bleeding, labs pending.   Goal of Therapy:  Heparin level 0.3-0.7 units/ml   Plan:  Give 4000 units bolus x 1 Start heparin infusion at 1100 units/hr Check anti-Xa level in 8 hours and daily while on heparin Continue to monitor H&H and  platelets Preliminary review of pertinent patient information completed.  [facility] clinical pharmacist will complete review during morning rounds to assess the patient and finalize treatment regimen.  Maverik Foot Scarlett, RPH 03/30/2016,1:10 AM

## 2016-03-30 NOTE — ED Provider Notes (Signed)
[facility] DEPT Provider Note   CSN: [id number] Arrival date & time: 03/30/16  0009  By signing my name below, I, Royce Macadamia, attest that this documentation has been prepared under the direction and in the presence of Orpah Greek, MD . Electronically Signed: Royce Macadamia, Scribe. 03/30/2016. 12:52 AM.  History   Chief Complaint Chief Complaint  Patient presents with  . Palpitations   The history is provided by the patient and medical records. No language interpreter was used.     HPI Comments:  Jenna [name] is a 80 y.o. female with history of HLD, HTN, who presents to the Emergency Department complaining of palpitation that began as she got into bed tonight.  She notes associated some SOB.  Pt has a history of Atrial fibrillation, but reports that it comes and goes; she does not stay in A-fib.  She does not believe that she had palpitations earlier in the evening.  She took an Asprin, but is not on an additional blood thinner.     Past Medical History:  Diagnosis Date  . Cataract   . DDD (degenerative disc disease)   . Fibromyalgia   . Herpes zoster   . History of skin cancer   . Hyperlipidemia   . Hypertension   . Hypothyroidism   . OA (osteoarthritis)   . Osteoporosis   . Palpitations   . PUD (peptic ulcer disease)   . Skin cancer of nose    [name]    Patient Active Problem List   Diagnosis Date Noted  . Right knee pain 01/30/2015  . Hypertension   . Precordial pain 02/05/2014  . Palpitations 02/05/2014  . History of shingles 01/11/2014  . Pre-diabetes 11/21/2013  . Obesity (BMI 30.0-34.9) 11/21/2013  . Mixed hypercholesterolemia and hypertriglyceridemia 11/21/2013  . Metabolic syndrome [id number]  . Multiple drug allergies 10/23/2013  . Statin intolerance 10/23/2013  . Osteopenia of the elderly 07/25/2013  . Vitamin D deficiency 06/19/2013  . Unspecified constipation 04/23/2013  . Rectal bleeding 04/23/2013  . Gout  02/21/2013  . Osteoarthritis 02/21/2013  . Fibromyalgia   . Hyperlipidemia 11/14/2008    Past Surgical History:  Procedure Laterality Date  . ABDOMINAL HYSTERECTOMY     partial  . BREAST BIOPSY     Right  . CATARACT EXTRACTION W/PHACO  10/28/2011   Procedure: CATARACT EXTRACTION PHACO AND INTRAOCULAR LENS PLACEMENT (IOC);  Surgeon: Tonny Branch, MD;  Location: [facility];  Service: Ophthalmology;  Laterality: Right;  CDE 18.38  . CATARACT EXTRACTION W/PHACO  02/14/2012   Procedure: CATARACT EXTRACTION PHACO AND INTRAOCULAR LENS PLACEMENT (IOC);  Surgeon: Tonny Branch, MD;  Location: [facility];  Service: Ophthalmology;  Laterality: Left;  CDE 17.20  . CESAREAN SECTION    . COLONOSCOPY N/A 05/31/2013   Procedure: COLONOSCOPY;  Surgeon: Rogene Houston, MD;  Location: [facility];  Service: Endoscopy;  Laterality: N/A;  240  . EYE SURGERY    . FRACTURE SURGERY  1952   fractured right arm    OB History    No data available       Home Medications    Prior to Admission medications   Medication Sig Start Date End Date Taking? Authorizing Provider  aspirin EC 81 MG tablet Take 81 mg by mouth daily. Takes 2-3 times weekly    Historical Provider, MD  azithromycin (ZITHROMAX Z-PAK) 250 MG tablet Take as directed 02/17/16   Chipper Herb, MD  benzonatate (TESSALON) 100 MG capsule Take 1 capsule (  100 mg total) by mouth 2 (two) times daily as needed for cough. 12/22/15   Chipper Herb, MD  Cholecalciferol (VITAMIN D3) 2000 UNITS TABS Take 1 tablet by mouth daily.      Historical Provider, MD  colchicine 0.6 MG tablet TAKE 1 TABLET ONCE DAILY. 03/07/15   Chipper Herb, MD  fish oil-omega-3 fatty acids 1000 MG capsule Take 1 g by mouth daily.     Historical Provider, MD  fluticasone (FLONASE) 50 MCG/ACT nasal spray Place 1 spray into both nostrils 2 (two) times daily as needed for allergies or rhinitis. 12/17/15   Fransisca Kaufmann Dettinger, MD  furosemide (LASIX) 20 MG tablet TAKE 1/2 TO 1 TABLET DAILY AS  NEEDED. 12/09/14   Chipper Herb, MD  levothyroxine (SYNTHROID, LEVOTHROID) 75 MCG tablet TAKE 1 TABLET ONCE DAILY. 11/11/15   Chipper Herb, MD  metoprolol (LOPRESSOR) 50 MG tablet TAKE 1 TABLET ONCE DAILY. 12/10/15   Chipper Herb, MD  PROCTOSOL HC 2.5 % rectal cream APPLY RECTALLY DAILY AS NEEDED FOR HEMORRHOIDS OR ITCHING. 12/23/15   Chipper Herb, MD  rivaroxaban (XARELTO) 20 MG TABS tablet Take 1 tablet (20 mg total) by mouth daily with supper. 03/30/16   Orpah Greek, MD  rosuvastatin (CRESTOR) 5 MG tablet Patient to take 5mg  on Monday, Wednesday, and Friday 08/12/15   Chipper Herb, MD  sertraline (ZOLOFT) 50 MG tablet Take 1 tablet (50 mg total) by mouth daily. 12/22/15   Chipper Herb, MD    Family History Family History  Problem Relation Age of Onset  . CVA Mother   . Stroke Mother   . Osteoporosis Mother   . Cancer Father     prostate  . Bronchitis Sister   . COPD Sister   . Early death Brother     died in [place]  . Carpal tunnel syndrome Sister   . Cancer Brother     throat  . Cancer Brother     lung  . Heart attack Brother   . Cancer Brother     throat  . Stroke Brother   . Gout Brother   . Heart Problems Brother     stents  . CAD Brother   . Hypertension Brother   . Gout Brother   . Arthritis Brother   . Coronary artery disease Neg Hx     No premature  . Anesthesia problems Neg Hx   . Hypotension Neg Hx   . Malignant hyperthermia Neg Hx   . Pseudochol deficiency Neg Hx     Social History Social History  Substance Use Topics  . Smoking status: Never Smoker  . Smokeless tobacco: Never Used  . Alcohol use No     Allergies   Cymbalta [duloxetine hcl]; Daypro [oxaprozin]; Diclofenac sodium; Effexor [venlafaxine hydrochloride]; Lipitor [atorvastatin calcium]; Penicillins; Prozac [fluoxetine hcl]; Sulfa antibiotics; Zocor [simvastatin]; Amitriptyline; Milnacipran hcl; and Savella [milnacipran hcl]   Review of Systems Review of Systems    Respiratory: Positive for shortness of breath.   Cardiovascular: Positive for palpitations.  All other systems reviewed and are negative.    Physical Exam Updated Vital Signs BP 142/72   Pulse 69   Temp 98 F (36.7 C) (Oral)   Resp 20   Wt 212 lb (96.2 kg)   SpO2 94%   BMI 35.28 kg/m   Physical Exam  Constitutional: She is oriented to person, place, and time. She appears well-developed and well-nourished. No distress.  HENT:  Head:  Normocephalic and atraumatic.  Eyes: Conjunctivae are normal.  Cardiovascular: An irregularly irregular rhythm present. Tachycardia present.   Pulmonary/Chest: Effort normal.  Neurological: She is alert and oriented to person, place, and time.  Skin: Skin is warm and dry.  Psychiatric: She has a normal mood and affect.  Nursing note and vitals reviewed.    ED Treatments / Results   DIAGNOSTIC STUDIES:  Oxygen Saturation is 94% on RA, NML by my interpretation.    COORDINATION OF CARE:  12:51 AM Discussed treatment plan with pt at bedside and pt agreed to plan.  Labs (all labs ordered are listed, but only abnormal results are displayed) Labs Reviewed  CBC WITH DIFFERENTIAL/PLATELET - Abnormal; Notable for the following:       Result Value   MCH 34.2 (*)    All other components within normal limits  COMPREHENSIVE METABOLIC PANEL - Abnormal; Notable for the following:    Chloride 100 (*)    Glucose, Bld 126 (*)    All other components within normal limits  BRAIN NATRIURETIC PEPTIDE  TROPONIN I  APTT  PROTIME-INR    EKG  EKG Interpretation  Date/Time:  Tuesday March 30 2016 00:15:47 EST Ventricular Rate:  116 PR Interval:    QRS Duration: 74 QT Interval:  278 QTC Calculation: 386 R Axis:   28 Text Interpretation:  Atrial fibrillation with rapid ventricular response Nonspecific ST abnormality Abnormal ECG Since last tracing Atrial fibrillation is new Confirmed by Eulis Foster  MD, ELLIOTT [phone]) on 03/30/2016 12:25:21 AM        Radiology Dg Chest Port 1 View  Result Date: 03/30/2016 CLINICAL DATA:  Palpitations and shortness of breath. History of hypertension and atrial fibrillation. Nonsmoker. EXAM: PORTABLE CHEST 1 VIEW COMPARISON:  01/21/2014 FINDINGS: Normal heart size and pulmonary vascularity. No focal airspace disease or consolidation in the lungs. No blunting of costophrenic angles. No pneumothorax. Mediastinal contours appear intact. Calcified aorta. IMPRESSION: No active disease. Electronically Signed   By: Lucienne Capers M.D.   On: 03/30/2016 01:54    Procedures Procedures (including critical care time)  Medications Ordered in ED Medications  diltiazem (CARDIZEM) 1 mg/mL load via infusion 10 mg (not administered)    And  diltiazem (CARDIZEM) 100 mg in dextrose 5 % 100 mL (1 mg/mL) infusion (not administered)  heparin bolus via infusion 4,000 Units (4,000 Units Intravenous Bolus from Bag 03/30/16 0201)    Followed by  heparin ADULT infusion 100 units/mL (25000 units/256mL sodium chloride 0.45%) (1,100 Units/hr Intravenous New Bag/Given 03/30/16 0201)  rivaroxaban (XARELTO) tablet 20 mg (not administered)     Initial Impression / Assessment and Plan / ED Course  I have reviewed the triage vital signs and the nursing notes.  Pertinent labs & imaging results that were available during my care of the patient were reviewed by me and considered in my medical decision making (see chart for details).  Clinical Course    Patient presents to the ER with complaints of palpitations. Patient found to be in atrial fibrillation with rapid ventricular response upon arrival to the ER. Reviewing her records from cardiology reveals that she has had frequent palpitations in the past, but never had any documented arrhythmias. She is currently on Lopressor.  Arrangements were being made to start her on heparin and Cardizem. Patient converted to sinus rhythm spontaneously prior to initiation of Cardizem. Discussed  with Dr. Eula Fried, on-call for cardiology. Feels that the patient is safe for discharge and follow-up as an outpatient. As her chads  score is 3, will initiate Xarelto.  Return to the ER for any recurrent palpitations.  CHA2DS2-VASc Score for Atrial Fibrillation Stroke Risk from [email or web address]  on 03/30/2016 ** All calculations should be rechecked by clinician prior to use **  RESULT SUMMARY: 3 points Stroke risk was 3.2% per year in >90,000 patients (the [place] Atrial Fibrillation Cohort Study) and 4.6% risk of stroke/TIA/systemic embolism.  One recommendation suggests a 0 score is "low" risk and may not require anticoagulation; a 1 score is "low-moderate" risk and should consider antiplatelet or anticoagulation, and score 2 or greater is "moderate-high" risk and should otherwise be an anticoagulation candidate.   INPUTS: Age -> 1 = 65-74 Sex -> 1 = Female <abbr title='Congestive heart failure'>CHF</abbr> history -> 0 = No Hypertension history -> 1 = Yes Stroke/TIA/Thromboembolism history -> 0 = No Vascular disease history -> 0 = No Diabetes history -> 0 = No   Final Clinical Impressions(s) / ED Diagnoses   Final diagnoses:  Atrial fibrillation with rapid ventricular response (HCC)    New Prescriptions New Prescriptions   RIVAROXABAN (XARELTO) 20 MG TABS TABLET    Take 1 tablet (20 mg total) by mouth daily with supper.   I personally performed the services described in this documentation, which was scribed in my presence. The recorded information has been reviewed and is accurate.    Orpah Greek, MD 03/30/16 [phone]

## 2016-04-08 ENCOUNTER — Encounter: Payer: Self-pay | Admitting: Physician Assistant

## 2016-04-08 ENCOUNTER — Encounter: Payer: Self-pay | Admitting: *Deleted

## 2016-04-08 ENCOUNTER — Other Ambulatory Visit: Payer: Self-pay | Admitting: Family Medicine

## 2016-04-08 ENCOUNTER — Ambulatory Visit (INDEPENDENT_AMBULATORY_CARE_PROVIDER_SITE_OTHER): Payer: Medicare Other | Admitting: Physician Assistant

## 2016-04-08 VITALS — BP 148/70 | HR 70 | Ht 66.0 in | Wt 211.4 lb

## 2016-04-08 DIAGNOSIS — I5032 Chronic diastolic (congestive) heart failure: Secondary | ICD-10-CM

## 2016-04-08 DIAGNOSIS — E782 Mixed hyperlipidemia: Secondary | ICD-10-CM | POA: Diagnosis not present

## 2016-04-08 DIAGNOSIS — I10 Essential (primary) hypertension: Secondary | ICD-10-CM

## 2016-04-08 DIAGNOSIS — I480 Paroxysmal atrial fibrillation: Secondary | ICD-10-CM

## 2016-04-08 MED ORDER — ROSUVASTATIN CALCIUM 5 MG PO TABS: 5.0000 mg | ORAL_TABLET | Freq: Every day | ORAL | 3 refills | Status: DC

## 2016-04-08 MED ORDER — ROSUVASTATIN CALCIUM 5 MG PO TABS: ORAL_TABLET | ORAL | 3 refills | Status: DC

## 2016-04-08 MED ORDER — RIVAROXABAN 20 MG PO TABS: 20.0000 mg | ORAL_TABLET | Freq: Every day | ORAL | 6 refills | Status: DC

## 2016-04-08 NOTE — Patient Instructions (Addendum)
  Your physician recommends that you schedule a follow-up appointment in: 4-6 Weeks with Dr Domenic Polite  Your physician has recommended you make the following change in your medication:  Xarelto 20 mg Daily   Stop Aspirin   Start Crestor 5 mg Monday, Wednesday, Friday   If you need a refill on your cardiac medications before your next appointment, please call your pharmacy.  Thank you for choosing [facility]!

## 2016-04-08 NOTE — Progress Notes (Signed)
Cardiology Office Note    Date:  04/08/2016   ID:  Jenna, [name] 19-Nov-1935, MRN [id number]  PCP:  Redge Gainer, MD  Cardiologist: Dr. Domenic Polite  No chief complaint on file.   History of Present Illness:  Jenna [name] is a 80 y.o. female  with history of palpitations treated with beta blocker, no arrhythmias identified on cardiac monitoring, normal Lexi scan September 2015.  Seen by Dr. Domenic Polite 07/2014 complaining of shortness of breath and leg edema. 2-D echo ordered and showed mild LVH, EF 60-65%, with grade 1 DD mild aortic regurgitation. Seen by Dr. Domenic Polite 06/2015 and doing well. No changes made.  Patient was in the emergency room 03/30/16 with palpitations and found to be in atrial fibrillation with RVR. Patient converted to sinus rhythm with Cardizem.CHADSVASC=3 and she was started on Xarelto.  Patient comes in today for follow-up. She never started the Xarelto but started taking an aspirin a day. She's had no further palpitations. She also stopped her Crestor just because she doesn't like taking a lot of pills not because it had any side effects. I had along discussion with her concerning the need for her to start her Xarelto and stop aspirin. She occasionally has leg swelling and shortness of breath when she lays down but it's usually related to when she doesn't take her Lasix or if she eats fat back.    Past Medical History:  Diagnosis Date  . Cataract   . DDD (degenerative disc disease)   . Fibromyalgia   . Herpes zoster   . History of skin cancer   . Hyperlipidemia   . Hypertension   . Hypothyroidism   . OA (osteoarthritis)   . Osteoporosis   . Palpitations   . PUD (peptic ulcer disease)   . Skin cancer of nose    Jenna [name]    Past Surgical History:  Procedure Laterality Date  . ABDOMINAL HYSTERECTOMY     partial  . BREAST BIOPSY     Right  . CATARACT EXTRACTION W/PHACO  10/28/2011   Procedure: CATARACT EXTRACTION PHACO AND INTRAOCULAR LENS  PLACEMENT (IOC);  Surgeon: Tonny Branch, MD;  Location: [facility];  Service: Ophthalmology;  Laterality: Right;  CDE 18.38  . CATARACT EXTRACTION W/PHACO  02/14/2012   Procedure: CATARACT EXTRACTION PHACO AND INTRAOCULAR LENS PLACEMENT (IOC);  Surgeon: Tonny Branch, MD;  Location: [facility];  Service: Ophthalmology;  Laterality: Left;  CDE 17.20  . CESAREAN SECTION    . COLONOSCOPY N/A 05/31/2013   Procedure: COLONOSCOPY;  Surgeon: Rogene Houston, MD;  Location: [facility];  Service: Endoscopy;  Laterality: N/A;  240  . EYE SURGERY    . FRACTURE SURGERY  1952   fractured right arm    Current Medications: Outpatient Medications Prior to Visit  Medication Sig Dispense Refill  . aspirin EC 81 MG tablet Take 81 mg by mouth daily. Takes 2-3 times weekly    . Cholecalciferol (VITAMIN D3) 2000 UNITS TABS Take 1 tablet by mouth daily.      . fish oil-omega-3 fatty acids 1000 MG capsule Take 1 g by mouth daily.     . fluticasone (FLONASE) 50 MCG/ACT nasal spray Place 1 spray into both nostrils 2 (two) times daily as needed for allergies or rhinitis. 16 g 6  . levothyroxine (SYNTHROID, LEVOTHROID) 75 MCG tablet TAKE 1 TABLET ONCE DAILY. 30 tablet 8  . metoprolol (LOPRESSOR) 50 MG tablet TAKE 1 TABLET ONCE DAILY. 30 tablet 6  .  PROCTOSOL HC 2.5 % rectal cream APPLY RECTALLY DAILY AS NEEDED FOR HEMORRHOIDS OR ITCHING. 28.35 g 0  . azithromycin (ZITHROMAX Z-PAK) 250 MG tablet Take as directed (Patient not taking: Reported on 04/08/2016) 6 each 0  . benzonatate (TESSALON) 100 MG capsule Take 1 capsule (100 mg total) by mouth 2 (two) times daily as needed for cough. (Patient not taking: Reported on 04/08/2016) 20 capsule 0  . colchicine 0.6 MG tablet TAKE 1 TABLET ONCE DAILY. (Patient not taking: Reported on 04/08/2016) 30 tablet 0  . furosemide (LASIX) 20 MG tablet TAKE 1/2 TO 1 TABLET DAILY AS NEEDED. (Patient not taking: Reported on 04/08/2016) 30 tablet 5  . rivaroxaban (XARELTO) 20 MG TABS tablet Take 1 tablet  (20 mg total) by mouth daily with supper. (Patient not taking: Reported on 04/08/2016) 30 tablet 0  . rosuvastatin (CRESTOR) 5 MG tablet Patient to take 5mg  on Monday, Wednesday, and Friday (Patient not taking: Reported on 04/08/2016) 30 tablet 0  . sertraline (ZOLOFT) 50 MG tablet Take 1 tablet (50 mg total) by mouth daily. (Patient not taking: Reported on 04/08/2016) 30 tablet 1   No facility-administered medications prior to visit.      Allergies:   Cymbalta [duloxetine hcl]; Daypro [oxaprozin]; Diclofenac sodium; Effexor [venlafaxine hydrochloride]; Lipitor [atorvastatin calcium]; Penicillins; Prozac [fluoxetine hcl]; Sulfa antibiotics; Zocor [simvastatin]; Amitriptyline; Milnacipran hcl; and Savella [milnacipran hcl]   Social History   Social History  . Marital status: Married    Spouse name: N/A  . Number of children: N/A  . Years of education: N/A   Social History Main Topics  . Smoking status: Never Smoker  . Smokeless tobacco: Never Used  . Alcohol use No  . Drug use: No  . Sexual activity: Yes    Birth control/ protection: Surgical   Other Topics Concern  . None   Social History Narrative   Married     Family History:  The patient's family history includes Arthritis in her brother; Bronchitis in her sister; CAD in her brother; COPD in her sister; CVA in her mother; Cancer in her brother, brother, brother, and father; Carpal tunnel syndrome in her sister; Early death in her brother; Gout in her brother and brother; Heart Problems in her brother; Heart attack in her brother; Hypertension in her brother; Osteoporosis in her mother; Stroke in her brother and mother.   ROS:   Please see the history of present illness.    Review of Systems  Constitution: Positive for weakness.  HENT: Negative.   Eyes: Negative.   Cardiovascular: Positive for irregular heartbeat and leg swelling.  Respiratory: Positive for shortness of breath.   Hematologic/Lymphatic: Negative.     Musculoskeletal: Negative.  Negative for joint pain.  Gastrointestinal: Negative.   Genitourinary: Negative.    All other systems reviewed and are negative.   PHYSICAL EXAM:   VS:  BP (!) 148/70   Pulse 70   Ht 5\' 6"  (1.676 m)   Wt 211 lb 6.4 oz (95.9 kg)   SpO2 98%   BMI 34.12 kg/m   Physical Exam  GEN: Well nourished, well developed, in no acute distress  Neck: no JVD, carotid bruits, or masses Cardiac:RRR; no murmurs, rubs, or gallops  Respiratory:  clear to auscultation bilaterally, normal work of breathing GI: soft, nontender, nondistended, + BS Ext: without cyanosis, clubbing, or edema, Good distal pulses bilaterally MS: no deformity or atrophy  Skin: warm and dry, no rash Psych: euthymic mood, full affect  Wt Readings from  Last 3 Encounters:  04/08/16 211 lb 6.4 oz (95.9 kg)  03/30/16 212 lb (96.2 kg)  12/22/15 209 lb (94.8 kg)      Studies/Labs Reviewed:   EKG:  EKG is ordered today.  The ekg ordered today demonstrates Normal sinus rhythm, T-wave inversion in V1 and V2, no acute change  Recent Labs: 12/22/2015: TSH 2.070 03/30/2016: ALT 16; B Natriuretic Peptide 61.0; BUN 10; Creatinine, Ser 0.65; Hemoglobin 14.1; Platelets 175; Potassium 4.1; Sodium 137   Lipid Panel    Component Value Date/Time   CHOL 196 12/22/2015 1023   TRIG 158 (H) 12/22/2015 1023   HDL 50 12/22/2015 1023   CHOLHDL 3.6 10/16/2008 0540   VLDL 23 10/16/2008 0540   LDLCALC 133 12/02/2014   LDLCALC 138 (H) 02/26/2014 1001    Additional studies/ records that were reviewed today include:  2-D echo 3/2016Study Conclusions  - Left ventricle: The cavity size was normal. Wall thickness was   increased in a pattern of mild LVH. Systolic function was normal.   The estimated ejection fraction was in the range of 60% to 65%.   Doppler parameters are consistent with abnormal left ventricular   relaxation (grade 1 diastolic dysfunction). - Aortic valve: There was mild regurgitation. Valve  area (VTI):   2.38 cm^2. Valve area (Vmax): 2.32 cm^2. - Atrial septum: No defect or patent foramen ovale was identified. - Systemic veins: IVC is dilated with normal respiratory variation,   estimated RA pressure is 8 mmHg. - Technically difficult study. Lexiscan Cardiolite 02/15/2014: TECHNIQUE: Standard myocardial SPECT imaging was performed after resting intravenous injection of 10 mCi Tc-32m sestamibi. Subsequently, intravenous infusion of Lexiscan was performed under the supervision of the Cardiology staff. At peak effect of the drug, 30 mCi Tc-60m sestamibi was injected intravenously and standard myocardial SPECT imaging was performed. Quantitative gated imaging was also performed to evaluate left ventricular wall motion, and estimate left ventricular ejection fraction.   COMPARISON:  None.   FINDINGS: ECG/stress data: The patient was stressed according to the Lexiscan protocol. The heart rate ranged from 59-86 beats per min. The blood pressure average 146/74. No chest pain was reported.   Baseline tracing demonstrated normal sinus rhythm with a nonspecific T-wave abnormality and leads V2 and V3. With stressed there noted arrhythmias nor any diagnostic ischaemic ST segment changes.   Perfusion: No decreased activity in the left ventricle on stress imaging to suggest reversible ischemia or infarction. Inferior and inferoseptal wall soft tissue attenuation artifact noted.   Wall Motion: Normal left ventricular wall motion. No left ventricular dilation.   Left Ventricular Ejection Fraction: 55 %   End diastolic volume 81 ml   End systolic volume 37 ml   IMPRESSION: 1. No reversible ischemia or infarction.   2. Normal left ventricular wall motion.   3. Left ventricular ejection fraction 55%   4. Low-risk stress test findings*.         ASSESSMENT:    1. Paroxysmal atrial fibrillation (HCC)   2. Essential hypertension   3. Mixed hyperlipidemia   4. Chronic  diastolic CHF (congestive heart failure) (HCC)      PLAN:  In order of problems listed above:  Paroxysmal atrial fibrillation documented in the emergency room converted to normal sinus rhythm with diltiazem. She was sent home on her regular dose metoprolol. She's had no recurrence. She did not start taking Xarelto as prescribed. I had along discussion with her concerning the need to start Xarelto and stop aspirin.CHADSVASC=3. Follow-up with Dr.  McDowell in 4-6 weeks.  Essential hypertension controlled  Mixed hyperlipidemia patient stopped taking Crestor. I've asked her to resume this.  Chronic diastolic CHF she takes Lasix as needed. It's usually related to her diet and excess of salt.     Medication Adjustments/Labs and Tests Ordered: Current medicines are reviewed at length with the patient today.  Concerns regarding medicines are outlined above.  Medication changes, Labs and Tests ordered today are listed in the Patient Instructions below. Patient Instructions       Signed, Ermalinda Barrios, PA-C  04/08/2016 3:05 PM    [facility] [address], [place], [place]  [address] Phone: [phone]; Fax: [phone]

## 2016-04-14 NOTE — Addendum Note (Signed)
Addended by: Barbarann Ehlers A on: 04/14/2016 08:00 AM   Modules accepted: Orders

## 2016-04-20 ENCOUNTER — Encounter (HOSPITAL_COMMUNITY): Payer: Self-pay | Admitting: *Deleted

## 2016-04-20 ENCOUNTER — Emergency Department (HOSPITAL_COMMUNITY): Payer: Medicare Other

## 2016-04-20 ENCOUNTER — Emergency Department (HOSPITAL_COMMUNITY)
Admission: EM | Admit: 2016-04-20 | Discharge: 2016-04-20 | Disposition: A | Discharge status: Home / Self Care | Payer: Medicare Other | Attending: Emergency Medicine | Admitting: Emergency Medicine

## 2016-04-20 DIAGNOSIS — Z85828 Personal history of other malignant neoplasm of skin: Secondary | ICD-10-CM | POA: Diagnosis not present

## 2016-04-20 DIAGNOSIS — I480 Paroxysmal atrial fibrillation: Secondary | ICD-10-CM | POA: Insufficient documentation

## 2016-04-20 DIAGNOSIS — E039 Hypothyroidism, unspecified: Secondary | ICD-10-CM | POA: Diagnosis not present

## 2016-04-20 DIAGNOSIS — I10 Essential (primary) hypertension: Secondary | ICD-10-CM | POA: Diagnosis not present

## 2016-04-20 DIAGNOSIS — Z79899 Other long term (current) drug therapy: Secondary | ICD-10-CM | POA: Insufficient documentation

## 2016-04-20 DIAGNOSIS — R0602 Shortness of breath: Secondary | ICD-10-CM | POA: Diagnosis present

## 2016-04-20 DIAGNOSIS — R000 Tachycardia, unspecified: Secondary | ICD-10-CM | POA: Diagnosis not present

## 2016-04-20 LAB — BASIC METABOLIC PANEL
ANION GAP: 7 (ref 5–15)
BUN: 19 mg/dL (ref 6–20)
CALCIUM: 8.8 mg/dL — AB (ref 8.9–10.3)
CO2: 29 mmol/L (ref 22–32)
Chloride: 101 mmol/L (ref 101–111)
Creatinine, Ser: 0.71 mg/dL (ref 0.44–1.00)
GFR calc Af Amer: >60 mL/min (ref >60)
GLUCOSE: 105 mg/dL — AB (ref 65–99)
Potassium: 3.8 mmol/L (ref 3.5–5.1)
SODIUM: 137 mmol/L (ref 135–145)

## 2016-04-20 LAB — CBC
HCT: 37.2 % (ref 36.0–46.0)
HEMOGLOBIN: 12.5 g/dL (ref 12.0–15.0)
MCH: 34 pg (ref 26.0–34.0)
MCHC: 33.6 g/dL (ref 30.0–36.0)
MCV: 101.1 fL — ABNORMAL HIGH (ref 78.0–100.0)
Platelets: 160 10*3/uL (ref 150–400)
RBC: 3.68 MIL/uL — ABNORMAL LOW (ref 3.87–5.11)
RDW: 13.2 % (ref 11.5–15.5)
WBC: 6 10*3/uL (ref 4.0–10.5)

## 2016-04-20 LAB — TROPONIN I

## 2016-04-20 NOTE — ED Provider Notes (Signed)
[facility] DEPT Provider Note   CSN: [id number] Arrival date & time: 04/20/16  0043  Time seen 01:40 AM   History   Chief Complaint Chief Complaint  Patient presents with  . Tachycardia    HPI MODELL EISAMAN is a 80 y.o. female.  HPI patient reports she had her first episode of atrial fibrillation about 2 years ago and then she had her second episode on November 7. She reports she converted in the emergency department with Cardizem. She was discharged home on Xarelto which she never took. She states she has hemorrhoids and she is afraid of "bleeding to death" if she's on Xarelto. She does state she takes a baby aspirin 81 mg daily. She states about 11 PM she was trying to write bills and couldn't find one of her bills. She also had spent the evening putting away Thanksgiving items. She states she had acute onset of feeling her heart was racing. She had some mild shortness of breath but denies chest pain, nausea, vomiting, or diaphoresis. She states she felt weak. She states she had similar feeling when she had atrial fibrillation before. She states she took an 81 mg aspirin prior to arrival. She is currently not aware of feeling her heart racing. She also states last time she had the episode after eating  chili beans and tonight she thinks her sister gave her pork ribs instead of beef ribs. I do not think this is the etiology. She states she was admitted when she had the first episode of atrial fibrillation for a couple days. The second time she was discharged from the ED. She states currently she just feels tired but she's no longer feeling like her heart is racing. She states she takes metoprolol in the morning.  PCP Dr Laurance Flatten Cardiology Dr Domenic Polite  Past Medical History:  Diagnosis Date  . Cataract   . DDD (degenerative disc disease)   . Fibromyalgia   . Herpes zoster   . History of skin cancer   . Hyperlipidemia   . Hypertension   . Hypothyroidism   . OA (osteoarthritis)     . Osteoporosis   . Palpitations   . PUD (peptic ulcer disease)   . Skin cancer of nose    [name]    Patient Active Problem List   Diagnosis Date Noted  . Right knee pain 01/30/2015  . Hypertension   . Precordial pain 02/05/2014  . Palpitations 02/05/2014  . History of shingles 01/11/2014  . Pre-diabetes 11/21/2013  . Obesity (BMI 30.0-34.9) 11/21/2013  . Mixed hypercholesterolemia and hypertriglyceridemia 11/21/2013  . Metabolic syndrome [id number]  . Multiple drug allergies 10/23/2013  . Statin intolerance 10/23/2013  . Osteopenia of the elderly 07/25/2013  . Vitamin D deficiency 06/19/2013  . Unspecified constipation 04/23/2013  . Rectal bleeding 04/23/2013  . Gout 02/21/2013  . Osteoarthritis 02/21/2013  . Fibromyalgia   . Hyperlipidemia 11/14/2008    Past Surgical History:  Procedure Laterality Date  . ABDOMINAL HYSTERECTOMY     partial  . BREAST BIOPSY     Right  . CATARACT EXTRACTION W/PHACO  10/28/2011   Procedure: CATARACT EXTRACTION PHACO AND INTRAOCULAR LENS PLACEMENT (IOC);  Surgeon: Tonny Branch, MD;  Location: [facility];  Service: Ophthalmology;  Laterality: Right;  CDE 18.38  . CATARACT EXTRACTION W/PHACO  02/14/2012   Procedure: CATARACT EXTRACTION PHACO AND INTRAOCULAR LENS PLACEMENT (IOC);  Surgeon: Tonny Branch, MD;  Location: [facility];  Service: Ophthalmology;  Laterality: Left;  CDE 17.20  .  CESAREAN SECTION    . COLONOSCOPY N/A 05/31/2013   Procedure: COLONOSCOPY;  Surgeon: Rogene Houston, MD;  Location: [facility];  Service: Endoscopy;  Laterality: N/A;  240  . EYE SURGERY    . FRACTURE SURGERY  1952   fractured right arm    OB History    No data available       Home Medications    Prior to Admission medications   Medication Sig Start Date End Date Taking? Authorizing Provider  Cholecalciferol (VITAMIN D3) 2000 UNITS TABS Take 1 tablet by mouth daily.      Historical Provider, MD  colchicine 0.6 MG tablet Take 0.6 mg by mouth daily as  needed (GOUT FLARE UP).    Historical Provider, MD  fish oil-omega-3 fatty acids 1000 MG capsule Take 1 g by mouth daily.     Historical Provider, MD  fluticasone (FLONASE) 50 MCG/ACT nasal spray Place 1 spray into both nostrils 2 (two) times daily as needed for allergies or rhinitis. 12/17/15   Fransisca Kaufmann Dettinger, MD  furosemide (LASIX) 20 MG tablet TAKE 1/2 TABLET BY MOUTH AS NEEDED FOR FLUID    Historical Provider, MD  levothyroxine (SYNTHROID, LEVOTHROID) 75 MCG tablet TAKE 1 TABLET ONCE DAILY. 11/11/15   Chipper Herb, MD  metoprolol (LOPRESSOR) 50 MG tablet TAKE 1 TABLET ONCE DAILY. 12/10/15   Chipper Herb, MD  PROCTOSOL HC 2.5 % rectal cream APPLY RECTALLY DAILY AS NEEDED FOR HEMRRHOIDS OR ITCHING. 04/09/16   Chipper Herb, MD  rivaroxaban (XARELTO) 20 MG TABS tablet Take 1 tablet (20 mg total) by mouth daily with supper. 04/08/16   Imogene Burn, PA-C  rosuvastatin (CRESTOR) 5 MG tablet Take Monday, Wednesday, Friday 04/08/16   Imogene Burn, PA-C    Family History Family History  Problem Relation Age of Onset  . CVA Mother   . Stroke Mother   . Osteoporosis Mother   . Cancer Father     prostate  . Bronchitis Sister   . COPD Sister   . Early death Brother     died in [place]  . Carpal tunnel syndrome Sister   . Cancer Brother     throat  . Cancer Brother     lung  . Heart attack Brother   . Cancer Brother     throat  . Stroke Brother   . Gout Brother   . Heart Problems Brother     stents  . CAD Brother   . Hypertension Brother   . Gout Brother   . Arthritis Brother   . Coronary artery disease Neg Hx     No premature  . Anesthesia problems Neg Hx   . Hypotension Neg Hx   . Malignant hyperthermia Neg Hx   . Pseudochol deficiency Neg Hx     Social History Social History  Substance Use Topics  . Smoking status: Never Smoker  . Smokeless tobacco: Never Used  . Alcohol use No  lives at home  Lives with spouse   Allergies   Cymbalta [duloxetine hcl]; Daypro  [oxaprozin]; Diclofenac sodium; Effexor [venlafaxine hydrochloride]; Lipitor [atorvastatin calcium]; Penicillins; Prozac [fluoxetine hcl]; Sulfa antibiotics; Zocor [simvastatin]; Amitriptyline; Milnacipran hcl; and Savella [milnacipran hcl]   Review of Systems Review of Systems  All other systems reviewed and are negative.    Physical Exam Updated Vital Signs BP (!) 137/52   Pulse (!) 59   Temp 97.7 F (36.5 C) (Oral)   Resp 19   Ht  5\' 6"  (1.676 m)   Wt 211 lb (95.7 kg)   SpO2 94%   BMI 34.06 kg/m   Physical Exam  Constitutional: She is oriented to person, place, and time. She appears well-developed and well-nourished.  Non-toxic appearance. She does not appear ill. No distress.  HENT:  Head: Normocephalic and atraumatic.  Right Ear: External ear normal.  Left Ear: External ear normal.  Nose: Nose normal. No mucosal edema or rhinorrhea.  Mouth/Throat: Oropharynx is clear and moist and mucous membranes are normal. No dental abscesses or uvula swelling.  Eyes: Conjunctivae and EOM are normal. Pupils are equal, round, and reactive to light.  Neck: Normal range of motion and full passive range of motion without pain. Neck supple.  Cardiovascular: Normal rate, regular rhythm and normal heart sounds.  Exam reveals no gallop and no friction rub.   No murmur heard. Pulmonary/Chest: Effort normal and breath sounds normal. No respiratory distress. She has no wheezes. She has no rhonchi. She has no rales. She exhibits no tenderness and no crepitus.  Abdominal: Soft. Normal appearance and bowel sounds are normal. She exhibits no distension. There is no tenderness. There is no rebound and no guarding.  Musculoskeletal: Normal range of motion. She exhibits no edema or tenderness.  Moves all extremities well.   Neurological: She is alert and oriented to person, place, and time. She has normal strength. No cranial nerve deficit.  Skin: Skin is warm, dry and intact. No rash noted. No erythema.  No pallor.  Psychiatric: She has a normal mood and affect. Her speech is normal and behavior is normal. Her mood appears not anxious.  Nursing note and vitals reviewed.    ED Treatments / Results  Labs (all labs ordered are listed, but only abnormal results are displayed) Results for orders placed or performed during the hospital encounter of 0000000  Basic metabolic panel  Result Value Ref Range   Sodium 137 135 - 145 mmol/L   Potassium 3.8 3.5 - 5.1 mmol/L   Chloride 101 101 - 111 mmol/L   CO2 29 22 - 32 mmol/L   Glucose, Bld 105 (H) 65 - 99 mg/dL   BUN 19 6 - 20 mg/dL   Creatinine, Ser 0.71 0.44 - 1.00 mg/dL   Calcium 8.8 (L) 8.9 - 10.3 mg/dL   GFR calc non Af Amer >60 >60 mL/min   GFR calc Af Amer >60 >60 mL/min   Anion gap 7 5 - 15  CBC  Result Value Ref Range   WBC 6.0 4.0 - 10.5 K/uL   RBC 3.68 (L) 3.87 - 5.11 MIL/uL   Hemoglobin 12.5 12.0 - 15.0 g/dL   HCT 37.2 36.0 - 46.0 %   MCV 101.1 (H) 78.0 - 100.0 fL   MCH 34.0 26.0 - 34.0 pg   MCHC 33.6 30.0 - 36.0 g/dL   RDW 13.2 11.5 - 15.5 %   Platelets 160 150 - 400 K/uL  Troponin I  Result Value Ref Range   Troponin I <0.03 <0.03 ng/mL   Laboratory interpretation all normal     EKG  EKG Interpretation  Date/Time:  Tuesday April 20 2016 01:00:29 EST Ventricular Rate:  63 PR Interval:    QRS Duration: 94 QT Interval:  430 QTC Calculation: 441 R Axis:   -3 Text Interpretation:  Sinus rhythm Low voltage, precordial leads Abnormal R-wave progression, early transition Baseline wander in lead(s) III aVL aVF V3 V6 Since last tracing rate slower 30 Mar 2016 Confirmed by [facility]  MD-I, Dellar Traber ([id number]) on 04/20/2016 1:03:55 AM       Radiology Dg Chest 2 View  Result Date: 04/20/2016 CLINICAL DATA:  Rapid and irregular heart rate EXAM: CHEST  2 VIEW COMPARISON:  03/30/2016 FINDINGS: Stable mild lingular opacity, could reflect scar or prominent fat pad. No acute consolidation. Stable cardiomediastinal silhouette.  Atherosclerosis. No pneumothorax. IMPRESSION: No radiographic evidence for acute cardiopulmonary abnormality Electronically Signed   By: Donavan Foil M.D.   On: 04/20/2016 02:09    Procedures Procedures (including critical care time)  Medications Ordered in ED Medications - No data to display   Initial Impression / Assessment and Plan / ED Course  I have reviewed the triage vital signs and the nursing notes.  Pertinent labs & imaging results that were available during my care of the patient were reviewed by me and considered in my medical decision making (see chart for details).  Clinical Course    Patient had already converted to normal sinus rhythm when she arrived to the ED. Laboratory testing was done which showed normal troponin. Chest x-ray did not show any acute findings, her EKG showed she was in normal sinus rhythm without acute changes. She was advised to follow back up with Dr. Domenic Polite. Review of her last cardiology visit with the PA on November 16 shows they wanted her to stop the aspirin and to start the xarelto.  Final Clinical Impressions(s) / ED Diagnoses   Final diagnoses:  PAF (paroxysmal atrial fibrillation) [facility])   Plan discharge  Rolland Porter, MD, Barbette Or, MD 04/20/16 [phone]

## 2016-04-20 NOTE — Discharge Instructions (Signed)
By the time you arrived in the ED tonight you were back in a regular rhythm and you remained in the regular rhythm. Looking at your cardiology note from November 16 they recommended you stop the aspirin and stay on the xarelto. Since this is your second episode in a month you should call and have another appointment with Dr. Domenic Polite, your cardiologist.

## 2016-04-20 NOTE — ED Triage Notes (Signed)
Pt c/o palpitations that started x 1 hour pta; pt states she was putting away thanksgiving items; pt denies any pain

## 2016-05-05 ENCOUNTER — Encounter: Payer: Self-pay | Admitting: Family Medicine

## 2016-05-05 ENCOUNTER — Ambulatory Visit (INDEPENDENT_AMBULATORY_CARE_PROVIDER_SITE_OTHER): Payer: Medicare Other | Admitting: Family Medicine

## 2016-05-05 VITALS — BP 124/72 | HR 65 | Temp 97.5°F | Ht 66.0 in | Wt 210.0 lb

## 2016-05-05 DIAGNOSIS — R1310 Dysphagia, unspecified: Secondary | ICD-10-CM | POA: Diagnosis not present

## 2016-05-05 DIAGNOSIS — F329 Major depressive disorder, single episode, unspecified: Secondary | ICD-10-CM

## 2016-05-05 DIAGNOSIS — Z789 Other specified health status: Secondary | ICD-10-CM | POA: Diagnosis not present

## 2016-05-05 DIAGNOSIS — E039 Hypothyroidism, unspecified: Secondary | ICD-10-CM | POA: Diagnosis not present

## 2016-05-05 DIAGNOSIS — E559 Vitamin D deficiency, unspecified: Secondary | ICD-10-CM

## 2016-05-05 DIAGNOSIS — I480 Paroxysmal atrial fibrillation: Secondary | ICD-10-CM | POA: Diagnosis not present

## 2016-05-05 DIAGNOSIS — E7800 Pure hypercholesterolemia, unspecified: Secondary | ICD-10-CM | POA: Diagnosis not present

## 2016-05-05 DIAGNOSIS — F32A Depression, unspecified: Secondary | ICD-10-CM

## 2016-05-05 DIAGNOSIS — I10 Essential (primary) hypertension: Secondary | ICD-10-CM | POA: Diagnosis not present

## 2016-05-05 DIAGNOSIS — M797 Fibromyalgia: Secondary | ICD-10-CM

## 2016-05-05 MED ORDER — ESCITALOPRAM OXALATE 10 MG PO TABS: 10.0000 mg | ORAL_TABLET | Freq: Every day | ORAL | 6 refills | Status: DC

## 2016-05-05 NOTE — Progress Notes (Signed)
Subjective:    Patient ID: Jenna [name], female    DOB: 1936/04/24, 80 y.o.   MRN: [id number]  HPI Pt here for follow up and management of chronic medical problems which includes hyperlipidemia, hypertension and hypothyroid. She is taking medications regularly.The patient was recently seen with a bout of atrial fibrillation in the emergency room and this was on 2 occasions in November. She did see [facility] cardiology and follow-up with this. She has a history chronic diastolic congestive heart failure and takes Lasix as needed. Her blood pressure was under good control. She has a follow-up appointment with the cardiologist, Dr. Domenic Polite soon. She was requested to take Xarelto and I'm not sure if she is taking this or not. She complains today of some depression and not sleeping well. She did stop taking her Crestor as she is statin intolerant. This patient denies any chest pain pressure or tightness. She has had a couple of episodes of atrial fibrillation and of course went to the emergency room for this and did see the PA that works with the cardiologist. Darral Dash asked her to start Xarelto. We will reemphasize that with the patient today and she says that she will start this. She does have a follow-up visit scheduled with the cardiologist himself. She also says that she's been extremely sleepy and tired and no energy and does not want to get up and do a lot. She said she had a previous bout with this after her father died. We will encourage her to start Lexapro generic and to take this regularly. She does have occasional shortness of breath and this is not always associated with her atrial fibrillation. She may have this with laying down and with sitting. She also complains of some swallowing difficulty at times and some constipation. The swallowing difficulty is not any worse than usual. She does have the constipation and occasionally she'll have bleeding from her hemorrhoids with this. She otherwise  denies any problems with blood in the stool heartburn or indigestion. She will get lab work done today.    Patient Active Problem List   Diagnosis Date Noted  . Right knee pain 01/30/2015  . Hypertension   . Precordial pain 02/05/2014  . Palpitations 02/05/2014  . History of shingles 01/11/2014  . Pre-diabetes 11/21/2013  . Obesity (BMI 30.0-34.9) 11/21/2013  . Mixed hypercholesterolemia and hypertriglyceridemia 11/21/2013  . Metabolic syndrome 90/24/0973  . Multiple drug allergies 10/23/2013  . Statin intolerance 10/23/2013  . Osteopenia of the elderly 07/25/2013  . Vitamin D deficiency 06/19/2013  . Unspecified constipation 04/23/2013  . Rectal bleeding 04/23/2013  . Gout 02/21/2013  . Osteoarthritis 02/21/2013  . Fibromyalgia   . Hyperlipidemia 11/14/2008   Outpatient Encounter Prescriptions as of 05/05/2016  Medication Sig  . Cholecalciferol (VITAMIN D3) 2000 UNITS TABS Take 1 tablet by mouth daily.    . colchicine 0.6 MG tablet Take 0.6 mg by mouth daily as needed (GOUT FLARE UP).  . fish oil-omega-3 fatty acids 1000 MG capsule Take 1 g by mouth daily.   . fluticasone (FLONASE) 50 MCG/ACT nasal spray Place 1 spray into both nostrils 2 (two) times daily as needed for allergies or rhinitis.  . furosemide (LASIX) 20 MG tablet TAKE 1/2 TABLET BY MOUTH AS NEEDED FOR FLUID  . levothyroxine (SYNTHROID, LEVOTHROID) 75 MCG tablet TAKE 1 TABLET ONCE DAILY.  . metoprolol (LOPRESSOR) 50 MG tablet TAKE 1 TABLET ONCE DAILY.  [name] PROCTOSOL HC 2.5 % rectal cream APPLY RECTALLY DAILY AS  NEEDED FOR HEMRRHOIDS OR ITCHING.  . rivaroxaban (XARELTO) 20 MG TABS tablet Take 1 tablet (20 mg total) by mouth daily with supper.  . rosuvastatin (CRESTOR) 5 MG tablet Take Monday, Wednesday, Friday (Patient not taking: Reported on 05/05/2016)   No facility-administered encounter medications on file as of 05/05/2016.       Review of Systems  Constitutional: Negative.   HENT: Negative.   Eyes:  Negative.   Respiratory: Negative.   Cardiovascular: Positive for palpitations (seen cardio and 2 ER visits).  Gastrointestinal: Negative.   Endocrine: Negative.   Genitourinary: Negative.   Musculoskeletal: Negative.   Skin: Negative.   Allergic/Immunologic: Negative.   Neurological: Negative.   Hematological: Negative.   Psychiatric/Behavioral: Negative.        Feels depressed and not sleeping well       Objective:   Physical Exam  Constitutional: She is oriented to person, place, and time. She appears well-developed and well-nourished. No distress.  The patient today is pleasant and alert.  HENT:  Head: Normocephalic and atraumatic.  Right Ear: External ear normal.  Left Ear: External ear normal.  Nose: Nose normal.  Mouth/Throat: Oropharynx is clear and moist. No oropharyngeal exudate.  Eyes: Conjunctivae and EOM are normal. Pupils are equal, round, and reactive to light. Right eye exhibits no discharge. Left eye exhibits no discharge. No scleral icterus.  Neck: Normal range of motion. Neck supple. No thyromegaly present.  No bruits thyromegaly or anterior cervical adenopathy  Cardiovascular: Normal rate, regular rhythm, normal heart sounds and intact distal pulses.   No murmur heard. The heart is regular at 72/m  Pulmonary/Chest: Effort normal and breath sounds normal. No respiratory distress. She has no wheezes. She has no rales.  Clear anteriorly and posteriorly  Abdominal: Soft. Bowel sounds are normal. She exhibits no mass. There is tenderness. There is no rebound and no guarding.  There is slight epigastric tenderness noted. There is no liver or spleen enlargement. There is no inguinal adenopathy or suprapubic tenderness.  Musculoskeletal: Normal range of motion. She exhibits no edema.  Lymphadenopathy:    She has no cervical adenopathy.  Neurological: She is alert and oriented to person, place, and time. She has normal reflexes. No cranial nerve deficit.  Skin: Skin  is warm and dry. No rash noted.  Psychiatric: She has a normal mood and affect. Her behavior is normal. Judgment and thought content normal.  Patient describes a lot of fatigue and dyspnea stressed in her surroundings. She said she had one previous episode of this when her father died. She is willing to take some Lexapro as a trial for a period of time as if this may help this feeling of depression.  Nursing note and vitals reviewed.   BP 124/72 (BP Location: Right Arm)   Pulse 65   Temp 97.5 F (36.4 C) (Oral)   Ht 5' 6"  (1.676 m)   Wt 210 lb (95.3 kg)   BMI 33.89 kg/m         Assessment & Plan:  1. Pure hypercholesterolemia -The patient so far has only been able to tolerate omega-3 fatty acids and no statins. We will check her lab work today and make recommendations as soon as that is returned. - CBC with Differential/Platelet - NMR, lipoprofile  2. Vitamin D deficiency **-Continue current treatment pending results of lab work - CBC with Differential/Platelet - VITAMIN D 25 Hydroxy (Vit-D Deficiency, Fractures)  3. Essential hypertension -The blood pressure is good today and she will  continue with current treatment - BMP8+EGFR - CBC with Differential/Platelet - Hepatic function panel  4. Fibromyalgia -Currently this seems to be stable and no further treatment is recommended. - CBC with Differential/Platelet  5. Hypothyroidism, unspecified type -Because of the fatigue and disinterest and tiredness we will get a thyroid profile and make any adjustments as necessary for this. - CBC with Differential/Platelet - Thyroid Panel With TSH  6. Hypothyroidism -Continue current treatment pending results of lab work - CBC with Differential/Platelet - Thyroid Panel With TSH  7. Statin intolerance -Continue aggressive therapeutic lifestyle changes and omega-3 fatty acids pending results of lab work  8. Paroxysmal atrial fibrillation (HCC) -Start Xarelto and follow-up with  cardiology as planned continue to avoid caffeine in the diet  9. Dysphagia, unspecified type -Take ranitidine 150 mg, the equate brand and take one twice daily before breakfast and supper to see if this helps with the swallowing problems  10. Depression, unspecified depression type -Start Lexapro 10 mg and take as directed  Patient Instructions                       Medicare Annual Wellness Visit  [facility] and the medical providers at [facility] strive to bring you the best medical care.  In doing so we not only want to address your current medical conditions and concerns but also to detect new conditions early and prevent illness, disease and health-related problems.    Medicare offers a yearly Wellness Visit which allows our clinical staff to assess your need for preventative services including immunizations, lifestyle education, counseling to decrease risk of preventable diseases and screening for fall risk and other medical concerns.    This visit is provided free of charge (no copay) for all Medicare recipients. The clinical pharmacists at [facility] have begun to conduct these Wellness Visits which will also include a thorough review of all your medications.    As you primary medical provider recommend that you make an appointment for your Annual Wellness Visit if you have not done so already this year.  You may set up this appointment before you leave today or you may call back ([phone]) and schedule an appointment.  Please make sure when you call that you mention that you are scheduling your Annual Wellness Visit with the clinical pharmacist so that the appointment may be made for the proper length of time.     Continue current medications. Continue good therapeutic lifestyle changes which include good diet and exercise. Fall precautions discussed with patient. If an FOBT was given today- please return it to our front desk. If you  are over 31 years old - you may need Prevnar 47 or the adult Pneumonia vaccine.  **Flu shots are available--- please call and schedule a FLU-CLINIC appointment**  After your visit with [place] today you will receive a survey in the mail or online from [employer] regarding your care with [place]. Please take a moment to fill this out. Your feedback is very important to [place] as you can help [place] better understand your patient needs as well as improve your experience and satisfaction. WE CARE ABOUT YOU!!!   Follow-up with cardiology as planned Take ranitidine 150 mg, the equate brand, 1 twice daily before breakfast and supper for 4-6 weeks and see if this helps the swallowing issues that she is currently having Start Xarelto and take as directed to avoid having a stroke in the future that  she may not recover from. Drink plenty of fluids and stay well hydrated Stay is active as possible and do not put yourself at risk for falling  Arrie Senate MD   .

## 2016-05-05 NOTE — Patient Instructions (Addendum)
Medicare Annual Wellness Visit  [facility] and the medical providers at [facility] strive to bring you the best medical care.  In doing so we not only want to address your current medical conditions and concerns but also to detect new conditions early and prevent illness, disease and health-related problems.    Medicare offers a yearly Wellness Visit which allows our clinical staff to assess your need for preventative services including immunizations, lifestyle education, counseling to decrease risk of preventable diseases and screening for fall risk and other medical concerns.    This visit is provided free of charge (no copay) for all Medicare recipients. The clinical pharmacists at [facility] have begun to conduct these Wellness Visits which will also include a thorough review of all your medications.    As you primary medical provider recommend that you make an appointment for your Annual Wellness Visit if you have not done so already this year.  You may set up this appointment before you leave today or you may call back [phone] and schedule an appointment.  Please make sure when you call that you mention that you are scheduling your Annual Wellness Visit with the clinical pharmacist so that the appointment may be made for the proper length of time.     Continue current medications. Continue good therapeutic lifestyle changes which include good diet and exercise. Fall precautions discussed with patient. If an FOBT was given today- please return it to our front desk. If you are over 78 years old - you may need Prevnar 5 or the adult Pneumonia vaccine.  **Flu shots are available--- please call and schedule a FLU-CLINIC appointment**  After your visit with [place] today you will receive a survey in the mail or online from [employer] regarding your care with [place]. Please take a moment to fill this out. Your feedback is very  important to [place] as you can help [place] better understand your patient needs as well as improve your experience and satisfaction. WE CARE ABOUT YOU!!!   Follow-up with cardiology as planned Take ranitidine 150 mg, the equate brand, 1 twice daily before breakfast and supper for 4-6 weeks and see if this helps the swallowing issues that she is currently having Start Xarelto and take as directed to avoid having a stroke in the future that she may not recover from. Drink plenty of fluids and stay well hydrated Stay is active as possible and do not put yourself at risk for falling

## 2016-05-05 NOTE — Addendum Note (Signed)
Addended by: Zannie Cove on: 05/05/2016 11:32 AM   Modules accepted: Orders

## 2016-05-06 LAB — CBC WITH DIFFERENTIAL/PLATELET
BASOS: 0 %
Basophils Absolute: 0 10*3/uL (ref 0.0–0.2)
EOS (ABSOLUTE): 0.1 10*3/uL (ref 0.0–0.4)
EOS: 2 %
HEMATOCRIT: 37.9 % (ref 34.0–46.6)
HEMOGLOBIN: 12.7 g/dL (ref 11.1–15.9)
IMMATURE GRANS (ABS): 0 10*3/uL (ref 0.0–0.1)
IMMATURE GRANULOCYTES: 0 %
Lymphocytes Absolute: 1 10*3/uL (ref 0.7–3.1)
Lymphs: 21 %
MCH: 33 pg (ref 26.6–33.0)
MCHC: 33.5 g/dL (ref 31.5–35.7)
MCV: 98 fL — AB (ref 79–97)
MONOCYTES: 9 %
MONOS ABS: 0.4 10*3/uL (ref 0.1–0.9)
NEUTROS PCT: 68 %
Neutrophils Absolute: 3.3 10*3/uL (ref 1.4–7.0)
Platelets: 201 10*3/uL (ref 150–379)
RBC: 3.85 x10E6/uL (ref 3.77–5.28)
RDW: 14 % (ref 12.3–15.4)
WBC: 4.9 10*3/uL (ref 3.4–10.8)

## 2016-05-06 LAB — HEPATIC FUNCTION PANEL
ALK PHOS: 87 IU/L (ref 39–117)
ALT: 11 IU/L (ref 0–32)
AST: 12 IU/L (ref 0–40)
Albumin: 4.1 g/dL (ref 3.5–4.8)
BILIRUBIN TOTAL: 0.8 mg/dL (ref 0.0–1.2)
BILIRUBIN, DIRECT: 0.2 mg/dL (ref 0.00–0.40)
Total Protein: 6.1 g/dL (ref 6.0–8.5)

## 2016-05-06 LAB — VITAMIN D 25 HYDROXY (VIT D DEFICIENCY, FRACTURES): Vit D, 25-Hydroxy: 38.2 ng/mL (ref 30.0–100.0)

## 2016-05-06 LAB — BMP8+EGFR
BUN/Creatinine Ratio: 27 (ref 12–28)
BUN: 17 mg/dL (ref 8–27)
CO2: 26 mmol/L (ref 18–29)
CREATININE: 0.63 mg/dL (ref 0.57–1.00)
Calcium: 9.3 mg/dL (ref 8.7–10.3)
Chloride: 99 mmol/L (ref 96–106)
GFR calc Af Amer: 99 mL/min/{1.73_m2} (ref >59)
GFR, EST NON AFRICAN AMERICAN: 86 mL/min/{1.73_m2} (ref >59)
Glucose: 96 mg/dL (ref 65–99)
Potassium: 4.5 mmol/L (ref 3.5–5.2)
SODIUM: 140 mmol/L (ref 134–144)

## 2016-05-06 LAB — NMR, LIPOPROFILE
Cholesterol: 212 mg/dL — ABNORMAL HIGH (ref 100–199)
HDL Cholesterol by NMR: 56 mg/dL (ref >39)
HDL PARTICLE NUMBER: 35 umol/L (ref >=30.5)
LDL Particle Number: 1729 nmol/L — ABNORMAL HIGH (ref <1000)
LDL SIZE: 20.9 nm (ref >20.5)
LDL-C: 129 mg/dL — AB (ref 0–99)
LP-IR SCORE: 47 — AB (ref <=45)
Small LDL Particle Number: 748 nmol/L — ABNORMAL HIGH (ref <=527)
Triglycerides by NMR: 137 mg/dL (ref 0–149)

## 2016-05-06 LAB — THYROID PANEL WITH TSH
Free Thyroxine Index: 3.8 (ref 1.2–4.9)
T3 Uptake Ratio: 29 % (ref 24–39)
T4, Total: 13.1 ug/dL — ABNORMAL HIGH (ref 4.5–12.0)
TSH: 1.84 u[IU]/mL (ref 0.450–4.500)

## 2016-05-10 ENCOUNTER — Ambulatory Visit (INDEPENDENT_AMBULATORY_CARE_PROVIDER_SITE_OTHER): Payer: Medicare Other | Admitting: Pharmacist

## 2016-05-10 ENCOUNTER — Encounter: Payer: Self-pay | Admitting: Pharmacist

## 2016-05-10 ENCOUNTER — Telehealth: Payer: Self-pay | Admitting: Family Medicine

## 2016-05-10 VITALS — BP 110/84 | HR 68 | Ht 65.5 in | Wt 216.0 lb

## 2016-05-10 DIAGNOSIS — Z0000 Encounter for general adult medical examination without abnormal findings: Secondary | ICD-10-CM | POA: Diagnosis not present

## 2016-05-10 NOTE — Telephone Encounter (Signed)
Pt aware of lab results. Does not want to start Crestor since she has had problems with the other cholesterol medications, but she will consider it.

## 2016-05-10 NOTE — Telephone Encounter (Signed)
Pt states she will try this crestor 5 on M, W, and F

## 2016-05-10 NOTE — Telephone Encounter (Signed)
The generic Crestor does not cost that much. Tell her to try half of a pill 3 times weekly and see if this bothers her if it does she can stop taking it.

## 2016-05-10 NOTE — Progress Notes (Signed)
Patient ID: JERUSHA LAMOREE, female   DOB: September 05, 1935, 80 y.o.   MRN: [id number]     Subjective:   MYKEIA WINGERTER is a 80 y.o. female who presents for A subsequent Medicare Annual Wellness Visit.  Mrs. Kemmerling is married. She and he husband of 19 years live in [place].  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 She has one grown son and one grandson.  She denies any health concerns today. She does have occasionaly knee pain which limits physical activity and is followed by [facility].   Current Medications (verified) Outpatient Encounter Prescriptions as of 05/10/2016  Medication Sig  . Cholecalciferol (VITAMIN D3) 2000 UNITS TABS Take 1 tablet by mouth daily.    . fish oil-omega-3 fatty acids 1000 MG capsule Take 1 g by mouth daily.   . fluticasone (FLONASE) 50 MCG/ACT nasal spray Place 1 spray into both nostrils 2 (two) times daily as needed for allergies or rhinitis.  . furosemide (LASIX) 20 MG tablet Take 20 mg by mouth daily as needed.   [name] levothyroxine (SYNTHROID, LEVOTHROID) 75 MCG tablet TAKE 1 TABLET ONCE DAILY.  . metoprolol (LOPRESSOR) 50 MG tablet TAKE 1 TABLET ONCE DAILY.  [name] PROCTOSOL HC 2.5 % rectal cream APPLY RECTALLY DAILY AS NEEDED FOR HEMRRHOIDS OR ITCHING.  . rivaroxaban (XARELTO) 20 MG TABS tablet Take 1 tablet (20 mg total) by mouth daily with supper.  . colchicine 0.6 MG tablet Take 0.6 mg by mouth daily as needed (GOUT FLARE UP).  [name] escitalopram (LEXAPRO) 10 MG tablet Take 1 tablet (10 mg total) by mouth daily. (Patient not taking: Reported on 05/10/2016)  . rosuvastatin (CRESTOR) 5 MG tablet Take Monday, Wednesday, Friday (Patient not taking: Reported on 05/10/2016)   No facility-administered encounter medications on file as of 05/10/2016.    Patient was prescribed escitalopram last week but did not start - she was not sure what it was prescribed   Allergies (verified) Daypro [oxaprozin]; Diclofenac sodium; Effexor  [venlafaxine hydrochloride]; Penicillins; Prozac [fluoxetine hcl]; Sulfa antibiotics; Amitriptyline; Cymbalta [duloxetine hcl]; Lipitor [atorvastatin calcium]; Milnacipran hcl; Savella [milnacipran hcl]; and Zocor [simvastatin]   History: Past Medical History:  Diagnosis Date  . Cataract   . DDD (degenerative disc disease)   . Fibromyalgia   . Herpes zoster   . History of skin cancer   . Hyperlipidemia   . Hypertension   . Hypothyroidism   . OA (osteoarthritis)   . Osteoporosis   . Palpitations   . Pre-diabetes   . PUD (peptic ulcer disease)   . Skin cancer of nose    [name]   Past Surgical History:  Procedure Laterality Date  . ABDOMINAL HYSTERECTOMY     partial  . BREAST BIOPSY     Right  . CATARACT EXTRACTION W/PHACO  10/28/2011   Procedure: CATARACT EXTRACTION PHACO AND INTRAOCULAR LENS PLACEMENT (IOC);  Surgeon: Tonny Branch, MD;  Location: [facility];  Service: Ophthalmology;  Laterality: Right;  CDE 18.38  . CATARACT EXTRACTION W/PHACO  02/14/2012   Procedure: CATARACT EXTRACTION PHACO AND INTRAOCULAR LENS PLACEMENT (IOC);  Surgeon: Tonny Branch, MD;  Location: [facility];  Service: Ophthalmology;  Laterality: Left;  CDE 17.20  . CESAREAN SECTION    . COLONOSCOPY N/A 05/31/2013   Procedure: COLONOSCOPY;  Surgeon: Rogene Houston, MD;  Location: [facility] SUITE;  Service: Endoscopy;  Laterality: N/A;  240  . EYE SURGERY     skin removal   . [place]  fractured right arm   Family History  Problem Relation Age of Onset  . CVA Mother   . Stroke Mother   . Osteoporosis Mother   . Cancer Father     prostate  . Bronchitis Sister   . COPD Sister   . Early death Brother     died in [place]  . Carpal tunnel syndrome Sister   . Cancer Brother     throat  . Cancer Brother     lung  . Heart attack Brother   . Cancer Brother     throat  . Stroke Brother   . Gout Brother   . Heart Problems Brother     stents  . CAD Brother   . Hypertension Brother   . Gout  Brother   . Arthritis Brother   . Diabetes Son   . Coronary artery disease Neg Hx     No premature  . Anesthesia problems Neg Hx   . Hypotension Neg Hx   . Malignant hyperthermia Neg Hx   . Pseudochol deficiency Neg Hx    Social History   Occupational History  . Not on file.   Social History Main Topics  . Smoking status: Never Smoker  . Smokeless tobacco: Never Used  . Alcohol use No  . Drug use: No  . Sexual activity: Yes    Birth control/ protection: Surgical    Do you feel safe at home?  Yes Are there smokers in your home (other than you)? No  Dietary issues and exercise activities: Current Exercise Habits: Home exercise routine, Type of exercise: walking, Time (Minutes): 10, Frequency (Times/Week): 2, Weekly Exercise (Minutes/Week): 20, Intensity: Moderate  Current Dietary habits:  Patient is limiting fried foods and has recently stopped eating fatback meat due to elevated cholesterol.  She does not add salt to food and tried to limit intake of bread and sugar due to pre diabetes.   Objective:    Today's Vitals   05/10/16 1551  BP: 110/84  Pulse: 68  Weight: 216 lb (98 kg)  Height: 5' 5.5" (1.664 m)  PainSc: 2   PainLoc: Back   Body mass index is 35.4 kg/m.  Activities of Daily Living In your present state of health, do you have any difficulty performing the following activities: 05/10/2016  Hearing? N  Vision? N  Difficulty concentrating or making decisions? N  Walking or climbing stairs? N  Dressing or bathing? N  Doing errands, shopping? N  Preparing Food and eating ? N  Using the Toilet? N  In the past six months, have you accidently leaked urine? N  Do you have problems with loss of bowel control? N  Managing your Medications? N  Managing your Finances? N  Housekeeping or managing your Housekeeping? N  Some recent data might be hidden     Cardiac Risk Factors include: advanced age (>55men, >49 women);dyslipidemia;family history of premature  cardiovascular disease;hypertension;obesity (BMI >30kg/m2);sedentary lifestyle  Depression Screen PHQ 2/9 Scores 05/10/2016 05/05/2016 12/22/2015 12/17/2015  PHQ - 2 Score 3 4 3 5   PHQ- 9 Score 10 14 11 12      Fall Risk Fall Risk  05/10/2016 05/05/2016 12/22/2015 12/17/2015 08/06/2015  Falls in the past year? Yes No Yes Yes No  Number falls in past yr: 1 - 1 - -  Injury with Fall? No - No No -  Risk for fall due to : - - - - -  Follow up Falls prevention discussed - - - -  Cognitive Function: MMSE - Mini Mental State Exam 05/10/2016 02/24/2015 02/24/2015  Orientation to time 4 5 -  Orientation to Place 5 5 -  Registration 3 3 3   Attention/ Calculation 4 5 -  Recall 3 3 -  Language- name 2 objects 2 2 -  Language- repeat 1 1 -  Language- follow 3 step command 3 3 -  Language- read & follow direction 1 1 -  Write a sentence 1 1 1   Copy design 1 1 -  Total score 28 30 -    Immunizations and Health Maintenance Immunization History  Administered Date(s) Administered  . Pneumococcal Conjugate-13 02/21/2013  . Pneumococcal Polysaccharide-23 05/24/2006  . Td 09/21/2004   There are no preventive care reminders to display for this patient.  Patient Care Team: Chipper Herb, MD as PCP - General (Family Medicine) Rogene Houston, MD as Consulting Physician (Gastroenterology) Satira Sark, MD as Consulting Physician (Cardiology) Latanya Maudlin, MD as Consulting Physician (Orthopedic Surgery)  Indicate any recent Medical Services you may have received from other than [facility] providers in the past year (date may be approximate).    Assessment:    Annual Wellness Visit    Screening Tests Health Maintenance  Topic Date Due  . ZOSTAVAX  07/21/2016 (Originally 07/20/1997)  . TETANUS/TDAP  07/21/2016 (Originally 09/22/2014)  . INFLUENZA VACCINE  08/21/2016 (Originally 12/23/2015)  . DEXA SCAN  08/05/2017  . MAMMOGRAM  10/20/2017  . PNA vac Low Risk Adult  Completed          Plan:   During the course of the visit Daniel was educated and counseled about the following appropriate screening and preventive services:   Vaccines to include Pneumoccal, Influenza, Td, Zostavax - patient declined all needed vaccines - including influenza, Tdap and Zostavax.  Colorectal cancer screening - UTD  Cardiovascular disease screening - UTD  BP at goal  Lipids were elevated at last check and patient has since started rosuvastatin 5mg  MWF.  Diabetes screening - Last FBG was 96  Bone Denisty / Osteoporosis Screening - UTD  Mammogram - UTD  PAP - UTD  Glaucoma screening /  Eye Exam - UTD  Nutrition counseling - discussed limiting serving sizes and low calorie diet.  Discussed BMI and weight goals.  Advanced Directives - patient declined  Physical Activity - discussed referral to PT or using recumbent bike for  Physical activity.  Fall prevention discussed.  Patient to start escitalopram 10mg  daily for mood.   Patient Instructions (the written plan) were given to the patient.   Cherre Robins, PharmD   05/10/2016

## 2016-05-10 NOTE — Patient Instructions (Addendum)
Jenna [name] , Thank you for taking time to come for your Medicare Wellness Visit. I appreciate your ongoing commitment to your health goals. Please review the following plan we discussed and let me know if I can assist you in the future.   These are the goals we discussed:  Increase vegetables - carrots, green bean, squash, zucchini, tomatoes, onions, peppers, spinach and other green leafy vegetables, cabbage, lettuce, cucumbers, asparagus, okra (not fried), eggplant Limit sugar and processed foods (cakes, cookies, ice cream, crackers and chips) Increase fresh fruit Limit red meat to no more than 1-2 times per week (serving size about the size of your palm) Choose whole grains / lean proteins - whole wheat bread, quinoa, whole grain rice (1/2 cup), fish, chicken, [place] Avoid sugar and calorie containing beverages - soda, sweet tea and juice.  Choose water or unsweetened tea instead.  Increase physical activity / exercise - try stationary bike or Silver Sneakers program.  Goal is to get 150 minutes of exercise each week.   Goal BMI (body mass index) is less than 29.  Start escitalopram / Lexapro for mood.  Take 1 tablet once a day - was sent to pharmacy by Dr Laurance Flatten last week.    This is a list of the screening recommended for you and due dates:  Health Maintenance  Topic Date Due  . Shingles Vaccine  07/21/2016*  . Tetanus Vaccine  07/21/2016*  . Flu Shot  08/21/2016*  . DEXA scan (bone density measurement)  08/05/2017  . Mammogram  10/20/2017  . Pneumonia vaccines  Completed  *Topic was postponed. The date shown is not the original due date.   Fall Prevention in the Home Falls can cause injuries and can affect people from all age groups. There are many simple things that you can do to make your home safe and to help prevent falls. What can I do on the outside of my home?  Regularly repair the edges of walkways and driveways and fix any cracks.  Remove high doorway  thresholds.  Trim any shrubbery on the main path into your home.  Use bright outdoor lighting.  Clear walkways of debris and clutter, including tools and rocks.  Regularly check that handrails are securely fastened and in good repair. Both sides of any steps should have handrails.  Install guardrails along the edges of any raised decks or porches.  Have leaves, snow, and ice cleared regularly.  Use sand or salt on walkways during winter months.  In the garage, clean up any spills right away, including grease or oil spills. What can I do in the bathroom?  Use night lights.  Install grab bars by the toilet and in the tub and shower. Do not use towel bars as grab bars.  Use non-skid mats or decals on the floor of the tub or shower.  If you need to sit down while you are in the shower, use a plastic, non-slip stool.  Keep the floor dry. Immediately clean up any water that spills on the floor.  Remove soap buildup in the tub or shower on a regular basis.  Attach bath mats securely with double-sided non-slip rug tape.  Remove throw rugs and other tripping hazards from the floor. What can I do in the bedroom?  Use night lights.  Make sure that a bedside light is easy to reach.  Do not use oversized bedding that drapes onto the floor.  Have a firm chair that has side arms to use for  getting dressed.  Remove throw rugs and other tripping hazards from the floor. What can I do in the kitchen?  Clean up any spills right away.  Avoid walking on wet floors.  Place frequently used items in easy-to-reach places.  If you need to reach for something above you, use a sturdy step stool that has a grab bar.  Keep electrical cables out of the way.  Do not use floor polish or wax that makes floors slippery. If you have to use wax, make sure that it is non-skid floor wax.  Remove throw rugs and other tripping hazards from the floor. What can I do in the stairways?  Do not leave  any items on the stairs.  Make sure that there are handrails on both sides of the stairs. Fix handrails that are broken or loose. Make sure that handrails are as long as the stairways.  Check any carpeting to make sure that it is firmly attached to the stairs. Fix any carpet that is loose or worn.  Avoid having throw rugs at the top or bottom of stairways, or secure the rugs with carpet tape to prevent them from moving.  Make sure that you have a light switch at the top of the stairs and the bottom of the stairs. If you do not have them, have them installed. What are some other fall prevention tips?  Wear closed-toe shoes that fit well and support your feet. Wear shoes that have rubber soles or low heels.  When you use a stepladder, make sure that it is completely opened and that the sides are firmly locked. Have someone hold the ladder while you are using it. Do not climb a closed stepladder.  Add color or contrast paint or tape to grab bars and handrails in your home. Place contrasting color strips on the first and last steps.  Use mobility aids as needed, such as canes, walkers, scooters, and crutches.  Turn on lights if it is dark. Replace any light bulbs that burn out.  Set up furniture so that there are clear paths. Keep the furniture in the same spot.  Fix any uneven floor surfaces.  Choose a carpet design that does not hide the edge of steps of a stairway.  Be aware of any and all pets.  Review your medicines with your healthcare provider. Some medicines can cause dizziness or changes in blood pressure, which increase your risk of falling. Talk with your health care provider about other ways that you can decrease your risk of falls. This may include working with a physical therapist or trainer to improve your strength, balance, and endurance. This information is not intended to replace advice given to you by your health care provider. Make sure you discuss any questions you  have with your health care provider. Document Released: 04/30/2002 Document Revised: 10/07/2015 Document Reviewed: 06/14/2014 Elsevier Interactive Patient Education  2017 [employer].

## 2016-05-14 ENCOUNTER — Encounter: Payer: Self-pay | Admitting: Cardiology

## 2016-05-14 ENCOUNTER — Ambulatory Visit (INDEPENDENT_AMBULATORY_CARE_PROVIDER_SITE_OTHER): Payer: Medicare Other | Admitting: Cardiology

## 2016-05-14 VITALS — BP 136/76 | HR 66 | Ht 65.0 in | Wt 211.0 lb

## 2016-05-14 DIAGNOSIS — I10 Essential (primary) hypertension: Secondary | ICD-10-CM | POA: Diagnosis not present

## 2016-05-14 DIAGNOSIS — Z8711 Personal history of peptic ulcer disease: Secondary | ICD-10-CM

## 2016-05-14 DIAGNOSIS — Z79899 Other long term (current) drug therapy: Secondary | ICD-10-CM

## 2016-05-14 DIAGNOSIS — E782 Mixed hyperlipidemia: Secondary | ICD-10-CM | POA: Diagnosis not present

## 2016-05-14 DIAGNOSIS — I480 Paroxysmal atrial fibrillation: Secondary | ICD-10-CM

## 2016-05-14 MED ORDER — METOPROLOL SUCCINATE ER 50 MG PO TB24: 50.0000 mg | ORAL_TABLET | Freq: Every day | ORAL | 3 refills | Status: DC

## 2016-05-14 NOTE — Progress Notes (Signed)
Cardiology Office Note  Date: 05/14/2016   ID: Jenna [name], Jenna [name] 07/14/1935, MRN [id number]  PCP: Jenna Gainer, MD  Primary Cardiologist: Jenna Lesches, MD   Chief Complaint  Patient presents with  . PAF    History of Present Illness: Jenna [name] is a 80 y.o. female last seen by Jenna [name] in mid November. She had been diagnosed with paroxysmal atrial fibrillation during ER visit earlier in November, started on Xarelto at that time with CHADSVASC score of 4 (annual stroke risk approximately 2.1% on Xarelto versus 5.1% on aspirin).  She comes in today for follow-up, states that she has done well without palpitations. She is tolerating Xarelto, no bleeding problems.  I did review her medications. She has been taking Lopressor once a day. We discussed changing this to Toprol-XL.   Past Medical History:  Diagnosis Date  . Cataract   . DDD (degenerative disc disease)   . Fibromyalgia   . Herpes zoster   . History of skin cancer   . Hyperlipidemia   . Hypertension   . Hypothyroidism   . OA (osteoarthritis)   . Osteoporosis   . Palpitations   . Pre-diabetes   . PUD (peptic ulcer disease)   . Skin cancer of nose    Jenna [name]    Past Surgical History:  Procedure Laterality Date  . ABDOMINAL HYSTERECTOMY     partial  . BREAST BIOPSY     Right  . CATARACT EXTRACTION W/PHACO  10/28/2011   Procedure: CATARACT EXTRACTION PHACO AND INTRAOCULAR LENS PLACEMENT (IOC);  Surgeon: Jenna Branch, MD;  Location: [facility];  Service: Ophthalmology;  Laterality: Right;  CDE 18.38  . CATARACT EXTRACTION W/PHACO  02/14/2012   Procedure: CATARACT EXTRACTION PHACO AND INTRAOCULAR LENS PLACEMENT (IOC);  Surgeon: Jenna Branch, MD;  Location: [facility];  Service: Ophthalmology;  Laterality: Left;  CDE 17.20  . CESAREAN SECTION    . COLONOSCOPY N/A 05/31/2013   Procedure: COLONOSCOPY;  Surgeon: Jenna Houston, MD;  Location: [facility];  Service: Endoscopy;  Laterality: N/A;  240  .  EYE SURGERY     skin removal   . FRACTURE SURGERY  1952   fractured right arm    Current Outpatient Prescriptions  Medication Sig Dispense Refill  . Cholecalciferol (VITAMIN D3) 2000 UNITS TABS Take 1 tablet by mouth daily.      . colchicine 0.6 MG tablet Take 0.6 mg by mouth daily as needed (GOUT FLARE UP).    [name] escitalopram (LEXAPRO) 10 MG tablet Take 1 tablet (10 mg total) by mouth daily. 30 tablet 6  . fish oil-omega-3 fatty acids 1000 MG capsule Take 1 g by mouth daily.     . fluticasone (FLONASE) 50 MCG/ACT nasal spray Place 1 spray into both nostrils 2 (two) times daily as needed for allergies or rhinitis. 16 g 6  . furosemide (LASIX) 20 MG tablet Take 20 mg by mouth daily as needed.     [name] levothyroxine (SYNTHROID, LEVOTHROID) 75 MCG tablet TAKE 1 TABLET ONCE DAILY. 30 tablet 8  . PROCTOSOL HC 2.5 % rectal cream APPLY RECTALLY DAILY AS NEEDED FOR HEMRRHOIDS OR ITCHING. 28.35 g 1  . rivaroxaban (XARELTO) 20 MG TABS tablet Take 1 tablet (20 mg total) by mouth daily with supper. 30 tablet 6  . rosuvastatin (CRESTOR) 5 MG tablet Take Monday, Wednesday, Friday 90 tablet 3  . metoprolol succinate (TOPROL-XL) 50 MG 24 hr tablet Take 1 tablet (50 mg total) by  mouth daily. Take with or immediately following a meal. 90 tablet 3   No current facility-administered medications for this visit.    Allergies:  Daypro [oxaprozin]; Diclofenac sodium; Effexor [venlafaxine hydrochloride]; Penicillins; Prozac [fluoxetine hcl]; Sulfa antibiotics; Amitriptyline; Cymbalta [duloxetine hcl]; Lipitor [atorvastatin calcium]; Milnacipran hcl; Savella [milnacipran hcl]; and Zocor [simvastatin]   Social History: The patient  reports that she has never smoked. She has never used smokeless tobacco. She reports that she does not drink alcohol or use drugs.   ROS:  Please see the history of present illness. Otherwise, complete review of systems is positive for none.  All other systems are reviewed and negative.    Physical Exam: VS:  BP 136/76   Pulse 66   Ht 5\' 5"  (1.651 m)   Wt 211 lb (95.7 kg)   SpO2 95%   BMI 35.11 kg/m , BMI Body mass index is 35.11 kg/m.  Wt Readings from Last 3 Encounters:  05/14/16 211 lb (95.7 kg)  05/10/16 216 lb (98 kg)  05/05/16 210 lb (95.3 kg)    General: Patient appears comfortable at rest. HEENT: Conjunctiva and lids normal, oropharynx clear with moist mucosa. Neck: Supple, no elevated JVP or carotid bruits, no thyromegaly. Lungs: Clear to auscultation, nonlabored breathing at rest. Cardiac: Regular rate and rhythm, no S3 or significant systolic murmur, no pericardial rub. Abdomen: Soft, nontender, bowel sounds present, no guarding or rebound. Extremities: Mild edema involving the ankles. Skin: Warm and dry. Musculoskeletal: No kyphosis. Neuropsychiatric: Alert and oriented 3, affect appropriate.  ECG: I personally reviewed the tracing from 04/20/2016 which showed sinus rhythm with low voltage.  Recent Labwork: 03/30/2016: B Natriuretic Peptide 61.0 04/20/2016: Hemoglobin 12.5 05/05/2016: ALT 11; AST 12; BUN 17; Creatinine, Ser 0.63; Platelets 201; Potassium 4.5; Sodium 140; TSH 1.840     Component Value Date/Time   CHOL 212 (H) 05/05/2016 1108   TRIG 137 05/05/2016 1108   HDL 56 05/05/2016 1108   CHOLHDL 3.6 10/16/2008 0540   VLDL 23 10/16/2008 0540   LDLCALC 133 12/02/2014   LDLCALC 138 (H) 02/26/2014 1001    Other Studies Reviewed Today:  Echocardiogram 08/20/2014: Study Conclusions  - Left ventricle: The cavity size was normal. Wall thickness was increased in a pattern of mild LVH. Systolic function was normal. The estimated ejection fraction was in the range of 60% to 65%. Doppler parameters are consistent with abnormal left ventricular relaxation (grade 1 diastolic dysfunction). - Aortic valve: There was mild regurgitation. Valve area (VTI): 2.38 cm^2. Valve area (Vmax): 2.32 cm^2. - Atrial septum: No defect or patent  foramen ovale was identified. - Systemic veins: IVC is dilated with normal respiratory variation, estimated RA pressure is 8 mmHg. - Technically difficult study.  Lexiscan Myoview 02/15/2014: IMPRESSION: 1. No reversible ischemia or infarction.  2. Normal left ventricular wall motion.  3. Left ventricular ejection fraction 55%  4. Low-risk stress test findings*.  Assessment and Plan:  1. Paroxysmal atrial fibrillation, CHADSVASC score of 4. Plan to change Lopressor to Toprol-XL 50 mg daily and continue Xarelto. Follow-up CBC and BMET for her next visit.  2. Hyperlipidemia, on Crestor. Recent lipids assessed by Dr. Laurance Flatten.  3. Essential hypertension, no additions made to current regimen.  4. History of PUD. She does not report any changes in stool, no hematochezia.  Current medicines were reviewed with the patient today.   Orders Placed This Encounter  Procedures  . CBC  . Basic Metabolic Panel (BMET)    Disposition: Follow-up in 4 months.  Signed, Satira Sark, MD, [facility] 05/14/2016 2:10 PM    [facility] at [facility] [address] [place], [place] [address] Phone: [phone]; Fax: [phone]

## 2016-05-14 NOTE — Patient Instructions (Signed)
Your physician wants you to follow-up in:  4 months Dr Ferne Reus will receive a reminder letter in the mail two months in advance. If you don't receive a letter, please call our office to schedule the follow-up appointment.    STOP Lopressor,   START Toprol XL 50 mg daily    Get lab work just BEFORE next visit    Thank you for choosing [facility] !

## 2016-05-18 ENCOUNTER — Other Ambulatory Visit: Payer: Self-pay | Admitting: Family Medicine

## 2016-07-03 ENCOUNTER — Other Ambulatory Visit: Payer: Self-pay | Admitting: Family Medicine

## 2016-07-07 NOTE — Progress Notes (Signed)
Cardiology Office Note  Date: 07/08/2016   ID: Ameria, Postlethwait 1936-04-22, MRN [id number]  PCP: Redge Gainer, MD  Primary Cardiologist: Rozann Lesches, MD   Chief Complaint  Patient presents with  . Paroxysmal atrial fibrillation    History of Present Illness: Jenna [name] is a 81 y.o. female last seen by Ms. Vita Barley in November 2017 after diagnosis of paroxysmal atrial fibrillation at ER visit. With CHADSVASC score of 3 anticoagulation was recommended and she was started on Xarelto. She presents today stating that she has done well in general, no significant recurring palpitations. No bleeding problems on Xarelto.  He does describe some leg cramping that occurs at nighttime, sometimes wakes her up. He does not have claudication symptoms.  I reviewed her medications which are outlined below. Current cardiac regimen includes Xarelto, Toprol-XL, Lasix, and Crestor.  Past Medical History:  Diagnosis Date  . Cataract   . DDD (degenerative disc disease)   . Fibromyalgia   . Herpes zoster   . History of skin cancer   . Hyperlipidemia   . Hypertension   . Hypothyroidism   . OA (osteoarthritis)   . Osteoporosis   . Palpitations   . Pre-diabetes   . PUD (peptic ulcer disease)   . Skin cancer of nose    [name]    Past Surgical History:  Procedure Laterality Date  . ABDOMINAL HYSTERECTOMY     partial  . BREAST BIOPSY     Right  . CATARACT EXTRACTION W/PHACO  10/28/2011   Procedure: CATARACT EXTRACTION PHACO AND INTRAOCULAR LENS PLACEMENT (IOC);  Surgeon: Tonny Branch, MD;  Location: [facility];  Service: Ophthalmology;  Laterality: Right;  CDE 18.38  . CATARACT EXTRACTION W/PHACO  02/14/2012   Procedure: CATARACT EXTRACTION PHACO AND INTRAOCULAR LENS PLACEMENT (IOC);  Surgeon: Tonny Branch, MD;  Location: [facility];  Service: Ophthalmology;  Laterality: Left;  CDE 17.20  . CESAREAN SECTION    . COLONOSCOPY N/A 05/31/2013   Procedure: COLONOSCOPY;  Surgeon: Rogene Houston, MD;  Location: [facility];  Service: Endoscopy;  Laterality: N/A;  240  . EYE SURGERY     skin removal   . FRACTURE SURGERY  1952   fractured right arm    Current Outpatient Prescriptions  Medication Sig Dispense Refill  . Cholecalciferol (VITAMIN D3) 2000 UNITS TABS Take 1 tablet by mouth daily.      . colchicine 0.6 MG tablet Take 0.6 mg by mouth daily as needed (GOUT FLARE UP).    [name] escitalopram (LEXAPRO) 10 MG tablet Take 1 tablet (10 mg total) by mouth daily. 30 tablet 6  . fish oil-omega-3 fatty acids 1000 MG capsule Take 1 g by mouth daily.     . fluticasone (FLONASE) 50 MCG/ACT nasal spray Place 1 spray into both nostrils 2 (two) times daily as needed for allergies or rhinitis. 16 g 6  . furosemide (LASIX) 20 MG tablet Take 20 mg by mouth daily as needed.     [name] levothyroxine (SYNTHROID, LEVOTHROID) 75 MCG tablet TAKE 1 TABLET ONCE DAILY. 30 tablet 8  . metoprolol succinate (TOPROL-XL) 50 MG 24 hr tablet Take 1 tablet (50 mg total) by mouth daily. Take with or immediately following a meal. 90 tablet 3  . PROCTOSOL HC 2.5 % rectal cream APPLY RECTALLY DAILY AS NEEDED FOR HEMRRHOIDS OR ITCHING. 28.35 g 0  . rivaroxaban (XARELTO) 20 MG TABS tablet Take 1 tablet (20 mg total) by mouth daily with supper.  30 tablet 6  . rosuvastatin (CRESTOR) 5 MG tablet Take Monday, Wednesday, Friday 90 tablet 3   No current facility-administered medications for this visit.    Allergies:  Daypro [oxaprozin]; Diclofenac sodium; Effexor [venlafaxine hydrochloride]; Penicillins; Prozac [fluoxetine hcl]; Sulfa antibiotics; Amitriptyline; Cymbalta [duloxetine hcl]; Lipitor [atorvastatin calcium]; Milnacipran hcl; Savella [milnacipran hcl]; and Zocor [simvastatin]   Social History: The patient  reports that she has never smoked. She has never used smokeless tobacco. She reports that she does not drink alcohol or use drugs.   ROS:  Please see the history of present illness. Otherwise, complete review  of systems is positive for none.  All other systems are reviewed and negative.   Physical Exam: VS:  BP 126/68   Pulse 69   Ht 5\' 5"  (1.651 m)   Wt 212 lb (96.2 kg)   SpO2 95%   BMI 35.28 kg/m , BMI Body mass index is 35.28 kg/m.  Wt Readings from Last 3 Encounters:  07/08/16 212 lb (96.2 kg)  05/14/16 211 lb (95.7 kg)  05/10/16 216 lb (98 kg)    General: Overweight woman, appears comfortable at rest. HEENT: Conjunctiva and lids normal, oropharynx clear. Neck: Supple, no elevated JVP or carotid bruits, no thyromegaly. Lungs: Clear to auscultation, nonlabored breathing at rest. Cardiac: Regular rate and rhythm, no S3 or significant systolic murmur, no pericardial rub. Abdomen: Soft, nontender, no hepatomegaly, bowel sounds present, no guarding or rebound. Extremities: Mild edema involving the ankles.  ECG: I personally reviewed the tracing from 04/20/2016 which showed sinus rhythm with R' in lead V1.  Recent Labwork: 03/30/2016: B Natriuretic Peptide 61.0 04/20/2016: Hemoglobin 12.5 05/05/2016: ALT 11; AST 12; BUN 17; Creatinine, Ser 0.63; Platelets 201; Potassium 4.5; Sodium 140; TSH 1.840     Component Value Date/Time   CHOL 212 (H) 05/05/2016 1108   TRIG 137 05/05/2016 1108   HDL 56 05/05/2016 1108   CHOLHDL 3.6 10/16/2008 0540   VLDL 23 10/16/2008 0540   LDLCALC 133 12/02/2014   LDLCALC 138 (H) 02/26/2014 1001    Other Studies Reviewed Today:  Echocardiogram 08/20/2014: Study Conclusions  - Left ventricle: The cavity size was normal. Wall thickness was increased in a pattern of mild LVH. Systolic function was normal. The estimated ejection fraction was in the range of 60% to 65%. Doppler parameters are consistent with abnormal left ventricular relaxation (grade 1 diastolic dysfunction). - Aortic valve: There was mild regurgitation. Valve area (VTI): 2.38 cm^2. Valve area (Vmax): 2.32 cm^2. - Atrial septum: No defect or patent foramen ovale was  identified. - Systemic veins: IVC is dilated with normal respiratory variation, estimated RA pressure is 8 mmHg. - Technically difficult study.  Lexiscan Cardiolite 9/45/2015: IMPRESSION: 1. No reversible ischemia or infarction.  2. Normal left ventricular wall motion.  3. Left ventricular ejection fraction 55%  4. Low-risk stress test findings*.  Assessment and Plan:  1. Paroxysmal atrial fibrillation with CHADSVASC score of 3. She is symptomatically stable now and tolerating Xarelto. Continue Toprol-XL and observation. Rate regular today.  2. Essential hypertension, blood pressure is well controlled today.  3. Hyperlipidemia, on Crestor.  4. Hypothyroidism, on Synthroid. TSH was normal in December 2017.  Current medicines were reviewed with the patient today.  Disposition: Follow-up in 6 months.  Signed, Satira Sark, MD, [facility] 07/08/2016 10:20 AM    [facility] [address] [place], [place] [address] Phone: [phone]; Fax: [phone]

## 2016-07-08 ENCOUNTER — Ambulatory Visit (INDEPENDENT_AMBULATORY_CARE_PROVIDER_SITE_OTHER): Payer: Medicare Other | Admitting: Cardiology

## 2016-07-08 ENCOUNTER — Encounter: Payer: Self-pay | Admitting: Cardiology

## 2016-07-08 VITALS — BP 126/68 | HR 69 | Ht 65.0 in | Wt 212.0 lb

## 2016-07-08 DIAGNOSIS — I480 Paroxysmal atrial fibrillation: Secondary | ICD-10-CM

## 2016-07-08 DIAGNOSIS — E039 Hypothyroidism, unspecified: Secondary | ICD-10-CM

## 2016-07-08 DIAGNOSIS — E782 Mixed hyperlipidemia: Secondary | ICD-10-CM | POA: Diagnosis not present

## 2016-07-08 DIAGNOSIS — I10 Essential (primary) hypertension: Secondary | ICD-10-CM | POA: Diagnosis not present

## 2016-07-08 NOTE — Patient Instructions (Signed)
Your physician wants you to follow-up in: 6 Months with Dr. McDowell.  You will receive a reminder letter in the mail two months in advance. If you don't receive a letter, please call our office to schedule the follow-up appointment.  Your physician recommends that you continue on your current medications as directed. Please refer to the Current Medication list given to you today.  If you need a refill on your cardiac medications before your next appointment, please call your pharmacy.  Thank you for choosing [facility]! '  

## 2016-07-22 ENCOUNTER — Telehealth: Payer: Self-pay | Admitting: Family Medicine

## 2016-07-22 NOTE — Telephone Encounter (Signed)
Spoke to pt and advised she would ntbs for evaluation prior to any antibiotics being called in. Pt voiced understanding and states she will call back to schedule an appt if she doesn't get better.

## 2016-07-23 ENCOUNTER — Telehealth: Payer: Self-pay | Admitting: Family Medicine

## 2016-07-23 NOTE — Telephone Encounter (Signed)
Patient needs to make appointmnet to be seen if wants antibiotic

## 2016-07-23 NOTE — Telephone Encounter (Signed)
Pt aware NTBS, did not want to come out in wind today. Appt made for tomorrow morning

## 2016-07-23 NOTE — Telephone Encounter (Signed)
Covering PCP, please advise.  

## 2016-07-24 ENCOUNTER — Ambulatory Visit (INDEPENDENT_AMBULATORY_CARE_PROVIDER_SITE_OTHER): Payer: Medicare Other | Admitting: Pediatrics

## 2016-07-24 ENCOUNTER — Encounter: Payer: Self-pay | Admitting: Pediatrics

## 2016-07-24 DIAGNOSIS — J069 Acute upper respiratory infection, unspecified: Secondary | ICD-10-CM | POA: Diagnosis not present

## 2016-07-24 DIAGNOSIS — B9789 Other viral agents as the cause of diseases classified elsewhere: Secondary | ICD-10-CM

## 2016-07-24 MED ORDER — FLUTICASONE PROPIONATE 50 MCG/ACT NA SUSP: 1.0000 | Freq: Two times a day (BID) | NASAL | 0 refills | Status: DC | PRN

## 2016-07-24 MED ORDER — CETIRIZINE HCL 5 MG PO TABS: 5.0000 mg | ORAL_TABLET | Freq: Every day | ORAL | 0 refills | Status: DC

## 2016-07-24 NOTE — Patient Instructions (Signed)
Netipot with distilled water 2-3 times a day to clear out sinuses Or Normal saline nasal spray  Flonase steroid nasal spray  Antihistamine daily such as cetirizine  Tylenol as needed Lots of fluids

## 2016-07-24 NOTE — Progress Notes (Signed)
  Subjective:   Patient ID: Jenna [name], female    DOB: 12-27-1935, 81 y.o.   MRN: [id number] CC: Nasal Congestion (Tues started w/ scratchy throat, wanted zpak called in needed to be seen took mucinex kept her up all night, chest hurting, no known fever)  HPI: Jenna [name] is a 81 y.o. female presenting for Nasal Congestion (Tues started w/ scratchy throat, wanted zpak called in needed to be seen took mucinex kept her up all night, chest hurting, no known fever)  Woke up 3 days ago with symptoms Slightly scratchy throat the night before Not coughing  Feels sore throughout body Appetite fine No dysuria, no nausea Mostly bothered now by congestion, only on the L side. Cant breathe through that nostril much of the time No fevers Doesn't think she has had the flu Feeling better today, sore throat at the beginning of the week is gone Tried mucinex last night, kept her awake all night  Relevant past medical, surgical, family and social history reviewed. Allergies and medications reviewed and updated. History  Smoking Status  . Never Smoker  Smokeless Tobacco  . Never Used   ROS: Per HPI   Objective:    BP 132/66   Pulse 62   Temp 97.9 F (36.6 C) (Oral)   Ht 5\' 5"  (1.651 m)   Wt 215 lb (97.5 kg)   BMI 35.78 kg/m   Wt Readings from Last 3 Encounters:  07/24/16 215 lb (97.5 kg)  07/08/16 212 lb (96.2 kg)  05/14/16 211 lb (95.7 kg)    Gen: NAD, alert, cooperative with exam, NCAT, well-appearing EYES: EOMI, no conjunctival injection, or no icterus ENT:  TMs pearly gray b/l, OP without erythema, no tenderness over sinuses b/l LYMPH: no cervical LAD CV: NRRR, normal S1/S2, no murmur, distal pulses 2+ b/l Resp: CTABL, no wheezes, normal WOB Abd: +BS, soft, NTND. no guarding or organomegaly Ext: No pitting edema, warm Neuro: Alert and oriented  Assessment & Plan:  Jenna [name] was seen today for nasal congestion.  Diagnoses and all orders for this visit:  Viral upper  respiratory infection Well-appearing, normal exam Discussed symptom care, return precautions No indication for abx now -     fluticasone (FLONASE) 50 MCG/ACT nasal spray; Place 1 spray into both nostrils 2 (two) times daily as needed for allergies or rhinitis. -     cetirizine (ZYRTEC) 5 MG tablet; Take 1 tablet (5 mg total) by mouth daily.  Follow up plan: prn Assunta Found, MD [place]

## 2016-08-02 ENCOUNTER — Encounter (HOSPITAL_COMMUNITY): Payer: Self-pay | Admitting: *Deleted

## 2016-08-02 ENCOUNTER — Emergency Department (HOSPITAL_COMMUNITY)
Admission: EM | Admit: 2016-08-02 | Discharge: 2016-08-03 | Disposition: A | Discharge status: Home / Self Care | Payer: Medicare Other | Attending: Emergency Medicine | Admitting: Emergency Medicine

## 2016-08-02 DIAGNOSIS — J029 Acute pharyngitis, unspecified: Secondary | ICD-10-CM | POA: Diagnosis not present

## 2016-08-02 DIAGNOSIS — I480 Paroxysmal atrial fibrillation: Secondary | ICD-10-CM | POA: Diagnosis not present

## 2016-08-02 DIAGNOSIS — R002 Palpitations: Secondary | ICD-10-CM | POA: Diagnosis not present

## 2016-08-02 DIAGNOSIS — Z85828 Personal history of other malignant neoplasm of skin: Secondary | ICD-10-CM | POA: Insufficient documentation

## 2016-08-02 DIAGNOSIS — E039 Hypothyroidism, unspecified: Secondary | ICD-10-CM | POA: Insufficient documentation

## 2016-08-02 DIAGNOSIS — Z79899 Other long term (current) drug therapy: Secondary | ICD-10-CM | POA: Diagnosis not present

## 2016-08-02 DIAGNOSIS — I10 Essential (primary) hypertension: Secondary | ICD-10-CM | POA: Diagnosis not present

## 2016-08-02 DIAGNOSIS — R0602 Shortness of breath: Secondary | ICD-10-CM | POA: Diagnosis not present

## 2016-08-02 LAB — CBC
HEMATOCRIT: 40.7 % (ref 36.0–46.0)
HEMOGLOBIN: 14.2 g/dL (ref 12.0–15.0)
MCH: 35.2 pg — AB (ref 26.0–34.0)
MCHC: 34.9 g/dL (ref 30.0–36.0)
MCV: 101 fL — AB (ref 78.0–100.0)
Platelets: 157 10*3/uL (ref 150–400)
RBC: 4.03 MIL/uL (ref 3.87–5.11)
RDW: 13.9 % (ref 11.5–15.5)
WBC: 6.1 10*3/uL (ref 4.0–10.5)

## 2016-08-02 NOTE — ED Provider Notes (Signed)
[facility] DEPT Provider Note   CSN: [id number] Arrival date & time: 08/02/16  2336  By signing my name below, I, Dolores Hoose, attest that this documentation has been prepared under the direction and in the presence of Rolland Porter, MD . Electronically Signed: Dolores Hoose, Scribe. 08/02/2016. 11:49 PM.  Time seen 23:55 PM  History   Chief Complaint Chief Complaint  Patient presents with  . Palpitations   The history is provided by the patient. No language interpreter was used.    HPI Comments:  Jenna [name] is a 82 y.o. female with pmhx of HLD and HTN who presents to the Emergency Department complaining of sudden-onset, constant heart palpitations, stating her heart feels like it is beating fast which she began experiencing about 2 hours ago around 21:40 PM. She states that she has experienced similar episodes over the past 3 years, has been seen in the ED twice before and has been admitted once for similar symptoms. Pt has never been cardioverted for these episodes.  Pt notes that she typically has one short episode a week and that they resolve on they own after several minutes, however, she [name] to the ED today due to the persistence of her symptoms. Pt reports associated SOB, light-headedness, fatigue, weakness and a burning sensation in her throat. She is compliant with her daily metoprolol without concerns. Pt denies any CP. She is a non-smoker and does not drink. She has never had a cardiac catheterization. She is on xarelto b/o her palpitations.   Pt secondarily complains of right-sided pleuritic rib pain which she has been experiencing for about a year but has exacerbated recently over the past 2 weeks. She states this pain is typically dull and constant, and is exacerbated by deep inhalation. She denies any cough or fever.   PCP Redge Gainer, MD Cardiology Dr Domenic Polite  Past Medical History:  Diagnosis Date  . Cataract   . DDD (degenerative disc disease)   .  Fibromyalgia   . Herpes zoster   . History of skin cancer   . Hyperlipidemia   . Hypertension   . Hypothyroidism   . OA (osteoarthritis)   . Osteoporosis   . Palpitations   . Pre-diabetes   . PUD (peptic ulcer disease)   . Skin cancer of nose    [name]  Paroxysmal A fib.  Patient Active Problem List   Diagnosis Date Noted  . Right knee pain 01/30/2015  . Hypertension   . Precordial pain 02/05/2014  . Palpitations 02/05/2014  . History of shingles 01/11/2014  . Pre-diabetes 11/21/2013  . Obesity (BMI 30.0-34.9) 11/21/2013  . Mixed hypercholesterolemia and hypertriglyceridemia 11/21/2013  . Metabolic syndrome 98/92/1194  . Multiple drug allergies 10/23/2013  . Statin intolerance 10/23/2013  . Osteopenia of the elderly 07/25/2013  . Vitamin D deficiency 06/19/2013  . Unspecified constipation 04/23/2013  . Rectal bleeding 04/23/2013  . Gout 02/21/2013  . Osteoarthritis 02/21/2013  . Fibromyalgia   . Hyperlipidemia 11/14/2008    Past Surgical History:  Procedure Laterality Date  . ABDOMINAL HYSTERECTOMY     partial  . BREAST BIOPSY     Right  . CATARACT EXTRACTION W/PHACO  10/28/2011   Procedure: CATARACT EXTRACTION PHACO AND INTRAOCULAR LENS PLACEMENT (IOC);  Surgeon: Tonny Branch, MD;  Location: [facility] ORS;  Service: Ophthalmology;  Laterality: Right;  CDE 18.38  . CATARACT EXTRACTION W/PHACO  02/14/2012   Procedure: CATARACT EXTRACTION PHACO AND INTRAOCULAR LENS PLACEMENT (IOC);  Surgeon: Tonny Branch, MD;  Location: [facility]  ORS;  Service: Ophthalmology;  Laterality: Left;  CDE 17.20  . CESAREAN SECTION    . COLONOSCOPY N/A 05/31/2013   Procedure: COLONOSCOPY;  Surgeon: Rogene Houston, MD;  Location: [facility] ENDO SUITE;  Service: Endoscopy;  Laterality: N/A;  240  . EYE SURGERY     skin removal   . FRACTURE SURGERY  1952   fractured right arm    OB History    No data available       Home Medications    Prior to Admission medications   Medication Sig Start Date End Date  Taking? Authorizing Provider  cetirizine (ZYRTEC) 5 MG tablet Take 1 tablet (5 mg total) by mouth daily. 07/24/16   Eustaquio Maize, MD  Cholecalciferol (VITAMIN D3) 2000 UNITS TABS Take 1 tablet by mouth daily.      Historical Provider, MD  colchicine 0.6 MG tablet Take 0.6 mg by mouth daily as needed (GOUT FLARE UP).    Historical Provider, MD  escitalopram (LEXAPRO) 10 MG tablet Take 1 tablet (10 mg total) by mouth daily. Patient not taking: Reported on 07/24/2016 05/05/16   Chipper Herb, MD  fish oil-omega-3 fatty acids 1000 MG capsule Take 1 g by mouth daily.     Historical Provider, MD  fluticasone (FLONASE) 50 MCG/ACT nasal spray Place 1 spray into both nostrils 2 (two) times daily as needed for allergies or rhinitis. 07/24/16   Eustaquio Maize, MD  furosemide (LASIX) 20 MG tablet Take 20 mg by mouth daily as needed.     Historical Provider, MD  levothyroxine (SYNTHROID, LEVOTHROID) 75 MCG tablet TAKE 1 TABLET ONCE DAILY. 11/11/15   Chipper Herb, MD  metoprolol succinate (TOPROL-XL) 50 MG 24 hr tablet Take 1 tablet (50 mg total) by mouth daily. Take with or immediately following a meal. 05/14/16 08/12/16  Satira Sark, MD  PROCTOSOL HC 2.5 % rectal cream APPLY RECTALLY DAILY AS NEEDED FOR HEMRRHOIDS OR ITCHING. 07/05/16   Chipper Herb, MD  rivaroxaban (XARELTO) 20 MG TABS tablet Take 1 tablet (20 mg total) by mouth daily with supper. 04/08/16   Imogene Burn, PA-C  rosuvastatin (CRESTOR) 5 MG tablet Take Monday, Wednesday, Friday Patient not taking: Reported on 07/24/2016 04/08/16   Imogene Burn, PA-C    Family History Family History  Problem Relation Age of Onset  . CVA Mother   . Stroke Mother   . Osteoporosis Mother   . Cancer Father     prostate  . Bronchitis Sister   . COPD Sister   . Early death Brother     died in [place]  . Carpal tunnel syndrome Sister   . Cancer Brother     throat  . Cancer Brother     lung  . Heart attack Brother   . Cancer Brother     throat    . Stroke Brother   . Gout Brother   . Heart Problems Brother     stents  . CAD Brother   . Hypertension Brother   . Gout Brother   . Arthritis Brother   . Diabetes Son   . Coronary artery disease Neg Hx     No premature  . Anesthesia problems Neg Hx   . Hypotension Neg Hx   . Malignant hyperthermia Neg Hx   . Pseudochol deficiency Neg Hx     Social History Social History  Substance Use Topics  . Smoking status: Never Smoker  . Smokeless tobacco: Never Used  .  Alcohol use No  lives at home Lives with spouse   Allergies   Daypro [oxaprozin]; Diclofenac sodium; Effexor [venlafaxine hydrochloride]; Penicillins; Prozac [fluoxetine hcl]; Sulfa antibiotics; Amitriptyline; Cymbalta [duloxetine hcl]; Lipitor [atorvastatin calcium]; Milnacipran hcl; Savella [milnacipran hcl]; and Zocor [simvastatin]   Review of Systems Review of Systems  Constitutional: Positive for fatigue.  HENT: Positive for sore throat.   Respiratory: Positive for shortness of breath.   Cardiovascular: Positive for palpitations. Negative for chest pain.  Neurological: Positive for light-headedness.  All other systems reviewed and are negative.    Physical Exam Updated Vital Signs BP 147/58   Pulse 65   Temp 98.1 F (36.7 C) (Oral)   Resp 20   Ht 5\' 5"  (1.651 m)   Wt 215 lb (97.5 kg)   SpO2 92%   BMI 35.78 kg/m   Vital signs normal    Physical Exam  Constitutional: She is oriented to person, place, and time. She appears well-developed and well-nourished.  Non-toxic appearance. She does not appear ill. No distress.  HENT:  Head: Normocephalic and atraumatic.  Right Ear: External ear normal.  Left Ear: External ear normal.  Nose: Nose normal. No mucosal edema or rhinorrhea.  Mouth/Throat: Oropharynx is clear and moist and mucous membranes are normal. No dental abscesses or uvula swelling.  Eyes: Conjunctivae and EOM are normal. Pupils are equal, round, and reactive to light.  Neck: Normal  range of motion and full passive range of motion without pain. Neck supple.  Cardiovascular: Normal rate, regular rhythm and normal heart sounds.  Exam reveals no gallop and no friction rub.   No murmur heard. Pulmonary/Chest: Effort normal and breath sounds normal. No respiratory distress. She has no wheezes. She has no rhonchi. She has no rales. She exhibits no tenderness and no crepitus.  Abdominal: Soft. Normal appearance and bowel sounds are normal. She exhibits no distension. There is no tenderness. There is no rebound and no guarding.  Musculoskeletal: Normal range of motion. She exhibits no edema or tenderness.  Moves all extremities well.   Neurological: She is alert and oriented to person, place, and time. She has normal strength. No cranial nerve deficit.  Skin: Skin is warm, dry and intact. No rash noted. No erythema. No pallor.  Psychiatric: She has a normal mood and affect. Her speech is normal and behavior is normal. Her mood appears not anxious.  Nursing note and vitals reviewed.    ED Treatments / Results  Labs (all labs ordered are listed, but only abnormal results are displayed) Results for orders placed or performed during the hospital encounter of 08/02/16  CBC  Result Value Ref Range   WBC 6.1 4.0 - 10.5 K/uL   RBC 4.03 3.87 - 5.11 MIL/uL   Hemoglobin 14.2 12.0 - 15.0 g/dL   HCT 40.7 36.0 - 46.0 %   MCV 101.0 (H) 78.0 - 100.0 fL   MCH 35.2 (H) 26.0 - 34.0 pg   MCHC 34.9 30.0 - 36.0 g/dL   RDW 13.9 11.5 - 15.5 %   Platelets 157 150 - 400 K/uL  Comprehensive metabolic panel  Result Value Ref Range   Sodium 139 135 - 145 mmol/L   Potassium 4.0 3.5 - 5.1 mmol/L   Chloride 103 101 - 111 mmol/L   CO2 28 22 - 32 mmol/L   Glucose, Bld 116 (H) 65 - 99 mg/dL   BUN 12 6 - 20 mg/dL   Creatinine, Ser 0.71 0.44 - 1.00 mg/dL   Calcium 9.2 8.9 -  10.3 mg/dL   Total Protein 7.2 6.5 - 8.1 g/dL   Albumin 4.3 3.5 - 5.0 g/dL   AST 20 15 - 41 U/L   ALT 15 14 - 54 U/L    Alkaline Phosphatase 86 38 - 126 U/L   Total Bilirubin 0.7 0.3 - 1.2 mg/dL   GFR calc non Af Amer >60 >60 mL/min   GFR calc Af Amer >60 >60 mL/min   Anion gap 8 5 - 15  Magnesium  Result Value Ref Range   Magnesium 2.1 1.7 - 2.4 mg/dL  Troponin I  Result Value Ref Range   Troponin I <0.03 <0.03 ng/mL   Laboratory interpretation all normal     EKG  EKG Interpretation  Date/Time:  Monday August 02 2016 23:50:56 EDT Ventricular Rate:  117 PR Interval:    QRS Duration: 76 QT Interval:  340 QTC Calculation: 475 R Axis:   47 Text Interpretation:  Atrial flutter/fibrillation Abnormal R-wave progression, early transition ST depression, consider ischemia, diffuse lds Since last tracing rate faster  20 Apr 2016 Atrial fibrillation with rapid ventricular response has replaced Normal sinus rhythm Confirmed by Kyrstal Monterrosa  MD-I, Minnie Legros ([id number]) on 08/02/2016 11:54:05 PM       #2  EKG Interpretation  Date/Time:  Tuesday August 03 2016 00:32:39 EDT Ventricular Rate:  67 PR Interval:    QRS Duration: 87 QT Interval:  412 QTC Calculation: 435 R Axis:   53 Text Interpretation:  Sinus rhythm Normal ECG Since last tracing of earlier today Normal sinus rhythm has replaced Atrial fibrillation with rapid ventricular response Confirmed by Starsha Morning  MD-I, Ysabel Stankovich ([id number]) on 08/03/2016 12:42:08 AM           Radiology Dg Chest 2 View  Result Date: 08/03/2016 CLINICAL DATA:  Heart palpitations 2 hours ago. Weakness and shortness of breath. Upper back pain. Right axillary rib pain. EXAM: CHEST  2 VIEW COMPARISON:  04/20/2016 FINDINGS: Normal heart size and pulmonary vascularity. No focal airspace disease or consolidation in the lungs. No blunting of costophrenic angles. No pneumothorax. Mediastinal contours appear intact. Calcified and tortuous aorta. Degenerative changes in the spine. IMPRESSION: No active cardiopulmonary disease. Electronically Signed   By: Lucienne Capers M.D.   On: 08/03/2016 00:33     Procedures Procedures (including critical care time)  Medications Ordered in ED Medications - No data to display   Initial Impression / Assessment and Plan / ED Course  I have reviewed the triage vital signs and the nursing notes.  Pertinent labs & imaging results that were available during my care of the patient were reviewed by me and considered in my medical decision making (see chart for details).     COORDINATION OF CARE:  11:55 PM Pt returned to normal sinus rhythm as provider entered the room. Review of her monitor shows her fastest heart rate was 127.   12:07 AM Discussed treatment plan with pt at bedside which includes blood work and pt agreed to plan. Pt was on the monitor so will be able to tell if she goes back into afib.   01:30 AM pt states she is feeling better. We discussed going home with her labs are normal. She should let Dr. Myles Gip office know about her ED visit tonight. He may want to adjust her medications.  At time of discharge her labs are normal.   Review of Dr. Myles Gip note from February 15 shows patient does have paroxysmal atrial fibrillation. She had a normal [place] scan Cardiolite on  September 2015 that showed no reversible ischemia or infarction and normal left ventricular wall motion with ejection fraction of 55%.  Final Clinical Impressions(s) / ED Diagnoses   Final diagnoses:  Paroxysmal atrial fibrillation ([place])   Plan discharge  Rolland Porter, MD, FACEP   I personally performed the services described in this documentation, which was scribed in my presence. The recorded information has been reviewed and considered.  Rolland Porter, MD, Barbette Or, MD 08/03/16 [phone]

## 2016-08-02 NOTE — ED Triage Notes (Signed)
Pt c/o heart palpitations x 2 hours ago and states she feels weak and feels sob; pt c/o mid upper back pain and toothache; pt states she is on an antibiotic for the toothache

## 2016-08-02 NOTE — ED Notes (Signed)
Pt converted to NSR at 72.

## 2016-08-02 NOTE — ED Notes (Signed)
ED Provider at bedside. 

## 2016-08-03 ENCOUNTER — Telehealth: Payer: Self-pay | Admitting: Cardiology

## 2016-08-03 ENCOUNTER — Emergency Department (HOSPITAL_COMMUNITY): Payer: Medicare Other

## 2016-08-03 DIAGNOSIS — R0602 Shortness of breath: Secondary | ICD-10-CM | POA: Diagnosis not present

## 2016-08-03 DIAGNOSIS — I480 Paroxysmal atrial fibrillation: Secondary | ICD-10-CM | POA: Diagnosis not present

## 2016-08-03 LAB — COMPREHENSIVE METABOLIC PANEL
ALK PHOS: 86 U/L (ref 38–126)
ALT: 15 U/L (ref 14–54)
AST: 20 U/L (ref 15–41)
Albumin: 4.3 g/dL (ref 3.5–5.0)
Anion gap: 8 (ref 5–15)
BILIRUBIN TOTAL: 0.7 mg/dL (ref 0.3–1.2)
BUN: 12 mg/dL (ref 6–20)
CALCIUM: 9.2 mg/dL (ref 8.9–10.3)
CO2: 28 mmol/L (ref 22–32)
CREATININE: 0.71 mg/dL (ref 0.44–1.00)
Chloride: 103 mmol/L (ref 101–111)
GFR calc Af Amer: >60 mL/min (ref >60)
GLUCOSE: 116 mg/dL — AB (ref 65–99)
Potassium: 4 mmol/L (ref 3.5–5.1)
Sodium: 139 mmol/L (ref 135–145)
TOTAL PROTEIN: 7.2 g/dL (ref 6.5–8.1)

## 2016-08-03 LAB — TROPONIN I: Troponin I: <0.03 ng/mL (ref <0.03)

## 2016-08-03 LAB — MAGNESIUM: MAGNESIUM: 2.1 mg/dL (ref 1.7–2.4)

## 2016-08-03 NOTE — ED Notes (Signed)
ED Provider at bedside. 

## 2016-08-03 NOTE — Telephone Encounter (Signed)
FYI

## 2016-08-03 NOTE — Telephone Encounter (Signed)
Noted. It looks like she had a transient episode of atrial fibrillation that spontaneously converted to sinus rhythm in the ER. Would continue with current medications. If she is experiencing more frequent episodes of breakthrough palpitations, we may need to consider antiarrhythmic therapy such as flecainide. I can certainty see her back in the office sooner than her next anticipated visit if symptoms continue.

## 2016-08-03 NOTE — Telephone Encounter (Signed)
Pt wanted to make Dr. Domenic Polite aware that she was seen in the ER yesterday 08/02/16

## 2016-08-03 NOTE — Discharge Instructions (Signed)
Your blood work tonight is normal. Please call Dr McDowell's office this morning to let him know about your ED visit. He may want to change your medications. Return to the ED if you get the heart racing and it doesn't go away quickly again.

## 2016-08-04 NOTE — Telephone Encounter (Signed)
Called pt. No answer. [email or web address]

## 2016-08-06 NOTE — Telephone Encounter (Signed)
Called pt, left message for pt to return call.  

## 2016-08-20 ENCOUNTER — Telehealth: Payer: Self-pay | Admitting: Cardiology

## 2016-08-20 NOTE — Telephone Encounter (Signed)
Would hold Xarelto 48 hours prior to the procedure. She can resume within a few days after the procedure presuming there is adequate hemostasis.

## 2016-08-20 NOTE — Telephone Encounter (Signed)
Pt made aware. She voiced understanding.  

## 2016-08-20 NOTE — Telephone Encounter (Signed)
Pt is having root canal next Saturday 08/28/16, needs to know when to hold rivaroxaban (XARELTO) 20 MG TABS tablet [[id number]]

## 2016-08-20 NOTE — Telephone Encounter (Signed)
Will forward to Dr. McDowell 

## 2016-09-07 DIAGNOSIS — M170 Bilateral primary osteoarthritis of knee: Secondary | ICD-10-CM | POA: Diagnosis not present

## 2016-09-16 ENCOUNTER — Other Ambulatory Visit: Payer: Self-pay | Admitting: Family Medicine

## 2016-09-27 ENCOUNTER — Ambulatory Visit: Payer: Medicare Other | Admitting: Family Medicine

## 2016-10-05 DIAGNOSIS — M79676 Pain in unspecified toe(s): Secondary | ICD-10-CM | POA: Diagnosis not present

## 2016-10-05 DIAGNOSIS — M2041 Other hammer toe(s) (acquired), right foot: Secondary | ICD-10-CM | POA: Diagnosis not present

## 2016-10-05 DIAGNOSIS — M2042 Other hammer toe(s) (acquired), left foot: Secondary | ICD-10-CM | POA: Diagnosis not present

## 2016-10-05 DIAGNOSIS — B351 Tinea unguium: Secondary | ICD-10-CM | POA: Diagnosis not present

## 2016-10-12 ENCOUNTER — Ambulatory Visit: Payer: Medicare Other | Admitting: Family Medicine

## 2016-10-14 ENCOUNTER — Encounter: Payer: Self-pay | Admitting: Family Medicine

## 2016-10-14 ENCOUNTER — Ambulatory Visit (INDEPENDENT_AMBULATORY_CARE_PROVIDER_SITE_OTHER): Payer: Medicare Other | Admitting: Family Medicine

## 2016-10-14 VITALS — BP 134/64 | HR 65 | Temp 97.6°F | Ht 65.0 in | Wt 209.0 lb

## 2016-10-14 DIAGNOSIS — W57XXXA Bitten or stung by nonvenomous insect and other nonvenomous arthropods, initial encounter: Secondary | ICD-10-CM

## 2016-10-14 DIAGNOSIS — E7800 Pure hypercholesterolemia, unspecified: Secondary | ICD-10-CM

## 2016-10-14 DIAGNOSIS — M797 Fibromyalgia: Secondary | ICD-10-CM | POA: Diagnosis not present

## 2016-10-14 DIAGNOSIS — I480 Paroxysmal atrial fibrillation: Secondary | ICD-10-CM

## 2016-10-14 DIAGNOSIS — E559 Vitamin D deficiency, unspecified: Secondary | ICD-10-CM

## 2016-10-14 DIAGNOSIS — E039 Hypothyroidism, unspecified: Secondary | ICD-10-CM

## 2016-10-14 DIAGNOSIS — I10 Essential (primary) hypertension: Secondary | ICD-10-CM | POA: Diagnosis not present

## 2016-10-14 DIAGNOSIS — S30861A Insect bite (nonvenomous) of abdominal wall, initial encounter: Secondary | ICD-10-CM | POA: Diagnosis not present

## 2016-10-14 MED ORDER — DOXYCYCLINE HYCLATE 100 MG PO TABS: 100.0000 mg | ORAL_TABLET | Freq: Two times a day (BID) | ORAL | 0 refills | Status: DC

## 2016-10-14 MED ORDER — MUPIROCIN 2 % EX OINT: 1.0000 "application " | TOPICAL_OINTMENT | Freq: Two times a day (BID) | CUTANEOUS | 0 refills | Status: DC

## 2016-10-14 NOTE — Addendum Note (Signed)
Addended by: Zannie Cove on: 10/14/2016 10:24 AM   Modules accepted: Orders

## 2016-10-14 NOTE — Patient Instructions (Addendum)
Medicare Annual Wellness Visit  [facility] and the medical providers at [facility] strive to bring you the best medical care.  In doing so we not only want to address your current medical conditions and concerns but also to detect new conditions early and prevent illness, disease and health-related problems.    Medicare offers a yearly Wellness Visit which allows our clinical staff to assess your need for preventative services including immunizations, lifestyle education, counseling to decrease risk of preventable diseases and screening for fall risk and other medical concerns.    This visit is provided free of charge (no copay) for all Medicare recipients. The clinical pharmacists at [facility] have begun to conduct these Wellness Visits which will also include a thorough review of all your medications.    As you primary medical provider recommend that you make an appointment for your Annual Wellness Visit if you have not done so already this year.  You may set up this appointment before you leave today or you may call back ([phone]) and schedule an appointment.  Please make sure when you call that you mention that you are scheduling your Annual Wellness Visit with the clinical pharmacist so that the appointment may be made for the proper length of time.     Continue current medications. Continue good therapeutic lifestyle changes which include good diet and exercise. Fall precautions discussed with patient. If an FOBT was given today- please return it to our front desk. If you are over 62 years old - you may need Prevnar 69 or the adult Pneumonia vaccine.  **Flu shots are available--- please call and schedule a FLU-CLINIC appointment**  After your visit with [place] today you will receive a survey in the mail or online from [employer] regarding your care with [place]. Please take a moment to fill this out. Your feedback is very  important to [place] as you can help [place] better understand your patient needs as well as improve your experience and satisfaction. WE CARE ABOUT YOU!!!  Check yourself for ticks regularly Take antibiotic as directed twice daily with food for 3 weeks until completed We will call with lab work results as soon as they are returned Follow-up with cardiology as planned Use mupirocin ointment inside of nasal passages as directed nightly Discontinue Neosporin Use nasal saline frequently during the day

## 2016-10-14 NOTE — Progress Notes (Signed)
Subjective:    Patient ID: Jenna [name], female    DOB: [birth year], 81 y.o.   MRN: [id number]  HPI Pt here for follow up and management of chronic medical problems which includes hyperlipidemia and hypertension. She is taking medication regularly.The patient does complain of a sore in her nose as well as some left toenail fungus. She is due to receive an FOBT today and will get lab work today. She refuses the flu shot. She has multiple problems that were following up on today and these include hyperlipidemia hypertension and hypothyroidism and atrial fibrillation. She also has osteoarthritis. She takes Zyrtec for allergies and is on Lexapro as an antidepressant. She takes xeralto because of her atrial fibrillation. The patient sees Dr. Domenic Polite and sees him once a year for her heart. She is taking metoprolol 50 mg once daily and this is helped her rate and her blood pressure and she only takes her furosemide as needed for fluid retention. She denies any chest pain or shortness of breath. She denies any trouble with nausea vomiting heartburn indigestion blood in the stool or black tarry bowel movements and she is passing her water without problems. She tries to drink plenty of water but sometimes doesn't remember to do that. The patient also has had a recent tick bite to her abdomen and this apparently was a deer tick.    Patient Active Problem List   Diagnosis Date Noted  . Right knee pain 01/30/2015  . Hypertension   . Precordial pain 02/05/2014  . Palpitations 02/05/2014  . History of shingles 01/11/2014  . Pre-diabetes 11/21/2013  . Obesity (BMI 30.0-34.9) 11/21/2013  . Mixed hypercholesterolemia and hypertriglyceridemia 11/21/2013  . Metabolic syndrome 58/30/9407  . Multiple drug allergies 10/23/2013  . Statin intolerance 10/23/2013  . Osteopenia of the elderly 07/25/2013  . Vitamin D deficiency 06/19/2013  . Unspecified constipation 04/23/2013  . Rectal bleeding 04/23/2013  .  Gout 02/21/2013  . Osteoarthritis 02/21/2013  . Fibromyalgia   . Hyperlipidemia 11/14/2008   Outpatient Encounter Prescriptions as of 10/14/2016  Medication Sig  . cetirizine (ZYRTEC) 5 MG tablet Take 1 tablet (5 mg total) by mouth daily.  . Cholecalciferol (VITAMIN D3) 2000 UNITS TABS Take 1 tablet by mouth daily.    . colchicine 0.6 MG tablet Take 0.6 mg by mouth daily as needed (GOUT FLARE UP).  [name] escitalopram (LEXAPRO) 10 MG tablet Take 1 tablet (10 mg total) by mouth daily.  . fish oil-omega-3 fatty acids 1000 MG capsule Take 1 g by mouth daily.   . fluticasone (FLONASE) 50 MCG/ACT nasal spray Place 1 spray into both nostrils 2 (two) times daily as needed for allergies or rhinitis.  . furosemide (LASIX) 20 MG tablet Take 20 mg by mouth daily as needed.   [name] levothyroxine (SYNTHROID, LEVOTHROID) 75 MCG tablet TAKE 1 TABLET ONCE DAILY.  [name] PROCTOSOL HC 2.5 % rectal cream APPLY RECTALLY DAILY AS NEEDED FOR HEMRRHOIDS OR ITCHING.  . rivaroxaban (XARELTO) 20 MG TABS tablet Take 1 tablet (20 mg total) by mouth daily with supper.  . rosuvastatin (CRESTOR) 5 MG tablet Take Monday, Wednesday, Friday  . metoprolol succinate (TOPROL-XL) 50 MG 24 hr tablet Take 1 tablet (50 mg total) by mouth daily. Take with or immediately following a meal.   No facility-administered encounter medications on file as of 10/14/2016.       Review of Systems  Constitutional: Negative.   HENT: Negative.        Sore inside nostril  Eyes: Negative.   Respiratory: Negative.   Cardiovascular: Negative.   Gastrointestinal: Negative.   Endocrine: Negative.   Genitourinary: Negative.   Musculoskeletal: Negative.   Skin: Negative.        Fungus -left great toenail   Allergic/Immunologic: Negative.   Neurological: Negative.   Hematological: Negative.   Psychiatric/Behavioral: Negative.        Objective:   Physical Exam  Constitutional: She is oriented to person, place, and time. She appears well-developed and  well-nourished. No distress.  The patient is pleasant and alert and looks much younger than her stated age of 89 years.  HENT:  Head: Normocephalic and atraumatic.  Right Ear: External ear normal.  Left Ear: External ear normal.  Mouth/Throat: Oropharynx is clear and moist. No oropharyngeal exudate.  There is a redness just inside the nostril on the right side that the patient is complaining with today as far as the nasal sore is concerned. She has been using Neosporin we told her to stop that and only use the mupirocin normal and that we prescribed today.  Eyes: Conjunctivae and EOM are normal. Pupils are equal, round, and reactive to light. Right eye exhibits no discharge. Left eye exhibits no discharge. No scleral icterus.  Neck: Normal range of motion. Neck supple. No thyromegaly present.  No bruits thyromegaly or anterior cervical adenopathy  Cardiovascular: Normal rate, regular rhythm, normal heart sounds and intact distal pulses.   No murmur heard. Heart had a regular rate and rhythm at 60/m  Pulmonary/Chest: Effort normal and breath sounds normal. No respiratory distress. She has no wheezes. She has no rales.  Clear anteriorly and posteriorly  Abdominal: Soft. Bowel sounds are normal. She exhibits no mass. There is no tenderness. There is no rebound and no guarding.  Slight left upper quadrant tenderness. No masses. No organ enlargement. No bruits. No inguinal adenopathy detected. No suprapubic tenderness.  Musculoskeletal: Normal range of motion. She exhibits no edema.  Lymphadenopathy:    She has no cervical adenopathy.  Neurological: She is alert and oriented to person, place, and time. She has normal reflexes. No cranial nerve deficit.  Skin: Skin is warm and dry. Rash noted. There is erythema.  The site of the tick bite had surrounding erythema. There was no other rash apparent.  Psychiatric: She has a normal mood and affect. Her behavior is normal. Judgment and thought content  normal.  Nursing note and vitals reviewed.  BP 134/64 (BP Location: Left Arm)   Pulse 65   Temp 97.6 F (36.4 C) (Oral)   Ht 5' 5"  (1.651 m)   Wt 209 lb (94.8 kg)   BMI 34.78 kg/m         Assessment & Plan:  1. Pure hypercholesterolemia -Continue with omega-3 fatty acids, Crestor and as aggressive therapeutic lifestyle changes as possible. - CBC with Differential/Platelet - NMR, lipoprofile  2. Essential hypertension -Continue current treatment and reduce sodium intake - CBC with Differential/Platelet - BMP8+EGFR - Hepatic function panel  3. Fibromyalgia -No complaints with this today. - CBC with Differential/Platelet  4. Hypothyroidism, unspecified type -Continue current treatment pending results of lab work - CBC with Differential/Platelet  5. Paroxysmal atrial fibrillation (HCC) -Continue current treatment and follow-up with cardiology as planned - CBC with Differential/Platelet  6. Vitamin D deficiency -Continue current treatment pending results of lab work - CBC with Differential/Platelet - VITAMIN D 25 Hydroxy (Vit-D Deficiency, Fractures)  7. Tick bite, initial encounter -Check tests for [facility] mount spotted fever and Lyme  disease and take doxycycline twice daily with food for 3 weeks  No orders of the defined types were placed in this encounter.  Patient Instructions                       Medicare Annual Wellness Visit  [facility] and the medical providers at [facility] strive to bring you the best medical care.  In doing so we not only want to address your current medical conditions and concerns but also to detect new conditions early and prevent illness, disease and health-related problems.    Medicare offers a yearly Wellness Visit which allows our clinical staff to assess your need for preventative services including immunizations, lifestyle education, counseling to decrease risk of preventable diseases and screening for fall  risk and other medical concerns.    This visit is provided free of charge (no copay) for all Medicare recipients. The clinical pharmacists at [facility] have begun to conduct these Wellness Visits which will also include a thorough review of all your medications.    As you primary medical provider recommend that you make an appointment for your Annual Wellness Visit if you have not done so already this year.  You may set up this appointment before you leave today or you may call back ([phone]) and schedule an appointment.  Please make sure when you call that you mention that you are scheduling your Annual Wellness Visit with the clinical pharmacist so that the appointment may be made for the proper length of time.     Continue current medications. Continue good therapeutic lifestyle changes which include good diet and exercise. Fall precautions discussed with patient. If an FOBT was given today- please return it to our front desk. If you are over 36 years old - you may need Prevnar 93 or the adult Pneumonia vaccine.  **Flu shots are available--- please call and schedule a FLU-CLINIC appointment**  After your visit with [place] today you will receive a survey in the mail or online from [employer] regarding your care with [place]. Please take a moment to fill this out. Your feedback is very important to [place] as you can help [place] better understand your patient needs as well as improve your experience and satisfaction. WE CARE ABOUT YOU!!!  Check yourself for ticks regularly Take antibiotic as directed twice daily with food for 3 weeks until completed We will call with lab work results as soon as they are returned Follow-up with cardiology as planned Use mupirocin ointment inside of nasal passages as directed nightly Discontinue Neosporin Use nasal saline frequently during the day   Arrie Senate MD

## 2016-10-15 LAB — VITAMIN D 25 HYDROXY (VIT D DEFICIENCY, FRACTURES): Vit D, 25-Hydroxy: 41.7 ng/mL (ref 30.0–100.0)

## 2016-10-15 LAB — HEPATIC FUNCTION PANEL
ALBUMIN: 4.1 g/dL (ref 3.5–4.8)
ALT: 14 IU/L (ref 0–32)
AST: 15 IU/L (ref 0–40)
Alkaline Phosphatase: 88 IU/L (ref 39–117)
Bilirubin Total: 0.9 mg/dL (ref 0.0–1.2)
Bilirubin, Direct: 0.17 mg/dL (ref 0.00–0.40)
TOTAL PROTEIN: 6.3 g/dL (ref 6.0–8.5)

## 2016-10-15 LAB — BMP8+EGFR
BUN/Creatinine Ratio: 17 (ref 12–28)
BUN: 11 mg/dL (ref 8–27)
CALCIUM: 8.9 mg/dL (ref 8.7–10.3)
CO2: 26 mmol/L (ref 18–29)
Chloride: 100 mmol/L (ref 96–106)
Creatinine, Ser: 0.66 mg/dL (ref 0.57–1.00)
GFR calc Af Amer: 97 mL/min/{1.73_m2} (ref >59)
GFR calc non Af Amer: 84 mL/min/{1.73_m2} (ref >59)
Glucose: 100 mg/dL — ABNORMAL HIGH (ref 65–99)
POTASSIUM: 4.6 mmol/L (ref 3.5–5.2)
SODIUM: 141 mmol/L (ref 134–144)

## 2016-10-15 LAB — CBC WITH DIFFERENTIAL/PLATELET
BASOS ABS: 0 10*3/uL (ref 0.0–0.2)
Basos: 0 %
EOS (ABSOLUTE): 0.1 10*3/uL (ref 0.0–0.4)
Eos: 2 %
Hematocrit: 38.6 % (ref 34.0–46.6)
Hemoglobin: 12.8 g/dL (ref 11.1–15.9)
Immature Grans (Abs): 0 10*3/uL (ref 0.0–0.1)
Immature Granulocytes: 0 %
LYMPHS ABS: 1.1 10*3/uL (ref 0.7–3.1)
Lymphs: 21 %
MCH: 33.5 pg — AB (ref 26.6–33.0)
MCHC: 33.2 g/dL (ref 31.5–35.7)
MCV: 101 fL — ABNORMAL HIGH (ref 79–97)
MONOS ABS: 0.5 10*3/uL (ref 0.1–0.9)
Monocytes: 10 %
Neutrophils Absolute: 3.4 10*3/uL (ref 1.4–7.0)
Neutrophils: 67 %
Platelets: 174 10*3/uL (ref 150–379)
RBC: 3.82 x10E6/uL (ref 3.77–5.28)
RDW: 13.8 % (ref 12.3–15.4)
WBC: 5 10*3/uL (ref 3.4–10.8)

## 2016-10-15 LAB — NMR, LIPOPROFILE
CHOLESTEROL: 221 mg/dL — AB (ref 100–199)
HDL CHOLESTEROL BY NMR: 54 mg/dL (ref >39)
HDL Particle Number: 33.6 umol/L (ref >=30.5)
LDL PARTICLE NUMBER: 1673 nmol/L — AB (ref <1000)
LDL Size: 21.1 nm (ref >20.5)
LDL-C: 135 mg/dL — AB (ref 0–99)
LP-IR Score: 46 — ABNORMAL HIGH (ref <=45)
Small LDL Particle Number: 107 nmol/L (ref <=527)
TRIGLYCERIDES BY NMR: 161 mg/dL — AB (ref 0–149)

## 2016-10-15 LAB — LYME AB/WESTERN BLOT REFLEX
LYME DISEASE AB, QUANT, IGM: <0.8 index (ref 0.00–0.79)
Lyme IgG/IgM Ab: <0.91 {ISR} (ref 0.00–0.90)

## 2016-10-18 ENCOUNTER — Other Ambulatory Visit: Payer: Self-pay | Admitting: Family Medicine

## 2016-10-21 LAB — ROCKY MTN SPOTTED FVR ABS PNL(IGG+IGM)
RMSF IGM: 0.16 {index} (ref 0.00–0.89)
RMSF IgG: NEGATIVE

## 2016-10-21 LAB — SPECIMEN STATUS REPORT

## 2016-11-02 ENCOUNTER — Encounter: Payer: Self-pay | Admitting: Pharmacist

## 2016-11-02 ENCOUNTER — Ambulatory Visit (INDEPENDENT_AMBULATORY_CARE_PROVIDER_SITE_OTHER): Payer: Medicare Other | Admitting: Pharmacist

## 2016-11-02 DIAGNOSIS — E7800 Pure hypercholesterolemia, unspecified: Secondary | ICD-10-CM | POA: Diagnosis not present

## 2016-11-02 MED ORDER — ROSUVASTATIN CALCIUM 5 MG PO TABS: 5.0000 mg | ORAL_TABLET | ORAL | 0 refills | Status: DC

## 2016-11-02 NOTE — Patient Instructions (Signed)
Follow low fat diet from handout.  Try to increase walking to every day - goal is 150 minutes per week.   Start rosuvastatin / Crestor 5mg  tablet - take 1 tablet on Mondays, Wednesdays and Fridays.  Call me if you have increase in muscle aches or pains.

## 2016-11-02 NOTE — Progress Notes (Signed)
Subjective:    Jenna [name] is a 81 y.o. female who is referred by her PCP to start statin therapy due to hyperlipidemia.   The patient does use medications that may worsen dyslipidemias (corticosteroids, progestins, anabolic steroids, diuretics, beta-blockers, amiodarone, cyclosporine, olanzapine). Exercise: every other day. Previous history of cardiac disease includes: Atrial Fibrillation.  Cardiac Risk Factors Age > 45-female, > 55-female:  YES  +1  Smoking:   NO  Sig. family hx of CHD*:  NO  Hypertension:   YES  +1  Diabetes:   NO  HDL < 35:   NO  HDL > 59:   NO  Total:  2   *Significant family history of CHD per NCEP = MI or sudden death at less than 13 year old in father or other 1st-degree female relative, or less than 72 year old in mother or  other 1st-degree female relative  AHA 10 years CHD risk = 33%  The following portions of the patient's history were reviewed and updated as appropriate: allergies, current medications, past family history, past medical history, past social history, past surgical history and problem list.  Objective:    Lab Review Lab Results  Component Value Date   CHOL 221 (H) 10/14/2016   TRIG 161 (H) 10/14/2016   HDL 54 10/14/2016      Assessment:    Dyslipidemia with 10 years CHD risk of 33%  Target levels for LDL are: < 100 mg/dl (CHD or "CHD risk equivalent" is present)   Plan:   1. Dietary changes: Increase soluble fiber Reduce saturated fat, "trans" monounsaturated fatty acids, and cholesterol increase non starchy vegetables, whole grains and lean sources of protein 2. Exercise changes:  Advised to engage in walking daily - goal of at least 150 minutes per week 3. Lipid-lowering medications: start rosuvastatin 5mg  take 1 tablet MWF (Recommended by NCEP after 3-6 months of dietary therapy & lifestyle modification,  except if CHD is present or LDL well above 190.) Recheck lipids in 3 to 4 months  Note: The majority of the visit was  spent in counseling on the pathophysiology and treatment of dyslipidemias. The total face-to-face time was in excess of 20 minutes.

## 2016-12-27 ENCOUNTER — Other Ambulatory Visit: Payer: Self-pay

## 2016-12-27 MED ORDER — COLCHICINE 0.6 MG PO TABS: 0.6000 mg | ORAL_TABLET | Freq: Every day | ORAL | 0 refills | Status: DC | PRN

## 2016-12-28 ENCOUNTER — Ambulatory Visit (INDEPENDENT_AMBULATORY_CARE_PROVIDER_SITE_OTHER): Payer: Medicare Other | Admitting: Family Medicine

## 2016-12-28 VITALS — BP 128/59 | HR 82 | Temp 98.0°F | Ht 65.0 in | Wt 214.0 lb

## 2016-12-28 DIAGNOSIS — R600 Localized edema: Secondary | ICD-10-CM

## 2016-12-28 MED ORDER — FUROSEMIDE 40 MG PO TABS: 40.0000 mg | ORAL_TABLET | Freq: Every day | ORAL | 3 refills | Status: DC | PRN

## 2016-12-28 NOTE — Progress Notes (Signed)
   HPI  Patient presents today here with leg swelling.  Patient states that over the last 2 months or so she's had intermittent leg swelling. She states that she takes Lasix a few days a week. It does not seem to cause a good diuresis any longer.  She also requests handicap plaque or that she has difficulty walking more than 200 feet without resting.  No persistent dyspnea  PMH: Smoking status noted ROS: Per HPI  Objective: BP (!) 128/59 (BP Location: Left Arm, Patient Position: Sitting, Cuff Size: Large)   Pulse 82   Temp 98 F (36.7 C) (Oral)   Ht 5\' 5"  (1.651 m)   Wt 214 lb (97.1 kg)   BMI 35.61 kg/m  Gen: NAD, alert, cooperative with exam HEENT: NCAT CV: RRR, good S1/S2, no murmur Resp: CTABL, no wheezes, non-labored Ext: 1+ Pitting edema BLLE, symmetric Neuro: Alert and oriented, No gross deficits  Assessment and plan:  # Leg edmea Pt with mild dCHF ( grade 1 in 2016 echo) and likely venous stasis- multifactorial Not adequate response to 20 mg lasix, renal function appears very good. Recommend increase to 40 mg and repeat BMP in 1 month, she has f/u scheduled.  Also discussed compression stockings and elevating legs.     Meds ordered this encounter  Medications  . furosemide (LASIX) 40 MG tablet    Sig: Take 1 tablet (40 mg total) by mouth daily as needed.    Dispense:  30 tablet    Refill:  [place], MD [place] 12/28/2016, 7:08 PM

## 2016-12-28 NOTE — Patient Instructions (Signed)
Great to see you!  I have increased lasix to 40 mg once daily  Consider compression stockings  Come back in 3-4 weeks to follow up with Dr. Laurance Flatten or I for repeat labs and to make sure you are getting better.

## 2017-01-01 ENCOUNTER — Other Ambulatory Visit: Payer: Self-pay | Admitting: Cardiology

## 2017-01-03 ENCOUNTER — Telehealth: Payer: Self-pay | Admitting: Family Medicine

## 2017-01-03 NOTE — Telephone Encounter (Signed)
Pt called - she will come by tomorrow for urine test

## 2017-01-04 ENCOUNTER — Other Ambulatory Visit: Payer: Medicare Other

## 2017-01-04 DIAGNOSIS — R300 Dysuria: Secondary | ICD-10-CM | POA: Diagnosis not present

## 2017-01-04 LAB — MICROSCOPIC EXAMINATION
BACTERIA UA: NONE SEEN
RBC MICROSCOPIC, UA: NONE SEEN /HPF (ref 0–?)
RENAL EPITHEL UA: NONE SEEN /HPF

## 2017-01-04 LAB — URINALYSIS, COMPLETE
Bilirubin, UA: NEGATIVE
Glucose, UA: NEGATIVE
Ketones, UA: NEGATIVE
Leukocytes, UA: NEGATIVE
NITRITE UA: NEGATIVE
PH UA: 6.5 (ref 5.0–7.5)
Protein, UA: NEGATIVE
RBC, UA: NEGATIVE
Specific Gravity, UA: 1.01 (ref 1.005–1.030)
Urobilinogen, Ur: 0.2 mg/dL (ref 0.2–1.0)

## 2017-01-05 LAB — URINE CULTURE

## 2017-01-06 ENCOUNTER — Telehealth: Payer: Self-pay | Admitting: Family Medicine

## 2017-01-07 NOTE — Telephone Encounter (Signed)
Attempted to contract patient- busy

## 2017-01-07 NOTE — Telephone Encounter (Signed)
Pt notified of results Verbalizes understanding 

## 2017-01-10 ENCOUNTER — Other Ambulatory Visit: Payer: Self-pay | Admitting: Physician Assistant

## 2017-01-13 DIAGNOSIS — M1711 Unilateral primary osteoarthritis, right knee: Secondary | ICD-10-CM | POA: Diagnosis not present

## 2017-01-17 ENCOUNTER — Encounter: Payer: Self-pay | Admitting: *Deleted

## 2017-01-29 ENCOUNTER — Other Ambulatory Visit: Payer: Self-pay | Admitting: Cardiology

## 2017-02-01 DIAGNOSIS — M79676 Pain in unspecified toe(s): Secondary | ICD-10-CM | POA: Diagnosis not present

## 2017-02-01 DIAGNOSIS — B351 Tinea unguium: Secondary | ICD-10-CM | POA: Diagnosis not present

## 2017-02-02 NOTE — Progress Notes (Signed)
Cardiology Office Note  Date: 02/03/2017   ID: Jenna, [name] 1936/01/21, MRN [id number]  PCP: Chipper Herb, MD  Primary Cardiologist: Rozann Lesches, MD   Chief Complaint  Patient presents with  . PAF    History of Present Illness: Jenna [name] is a 81 y.o. female last seen in February. She presents for a routine follow-up visit. Since last encounter she does not report any chest pain, palpitations, or syncope. She states that she has been compliant with her medications.  Current cardiac regimen includes Xarelto, Toprol-XL, and Crestor. She denies any significant bleeding problems. I reviewed her interval lab work as noted below, hemoglobin and renal function were normal.  I personally reviewed her ECG today which shows sinus rhythm with R' in lead V1.   Past Medical History:  Diagnosis Date  . Cataract   . DDD (degenerative disc disease)   . Fibromyalgia   . Herpes zoster   . History of skin cancer   . Hyperlipidemia   . Hypertension   . Hypothyroidism   . OA (osteoarthritis)   . Osteoporosis   . Palpitations   . Pre-diabetes   . PUD (peptic ulcer disease)   . Skin cancer of nose    Jenna [name]    Past Surgical History:  Procedure Laterality Date  . ABDOMINAL HYSTERECTOMY     partial  . BREAST BIOPSY     Right  . CATARACT EXTRACTION W/PHACO  10/28/2011   Procedure: CATARACT EXTRACTION PHACO AND INTRAOCULAR LENS PLACEMENT (IOC);  Surgeon: Tonny Branch, MD;  Location: [facility];  Service: Ophthalmology;  Laterality: Right;  CDE 18.38  . CATARACT EXTRACTION W/PHACO  02/14/2012   Procedure: CATARACT EXTRACTION PHACO AND INTRAOCULAR LENS PLACEMENT (IOC);  Surgeon: Tonny Branch, MD;  Location: [facility];  Service: Ophthalmology;  Laterality: Left;  CDE 17.20  . CESAREAN SECTION    . COLONOSCOPY N/A 05/31/2013   Procedure: COLONOSCOPY;  Surgeon: Rogene Houston, MD;  Location: [facility] SUITE;  Service: Endoscopy;  Laterality: N/A;  240  . EYE SURGERY     skin  removal   . FRACTURE SURGERY  1952   fractured right arm    Current Outpatient Prescriptions  Medication Sig Dispense Refill  . cetirizine (ZYRTEC) 5 MG tablet Take 1 tablet (5 mg total) by mouth daily. 30 tablet 0  . Cholecalciferol (VITAMIN D3) 2000 UNITS TABS Take 1 tablet by mouth daily.      . colchicine 0.6 MG tablet Take 1 tablet (0.6 mg total) by mouth daily as needed (GOUT FLARE UP). 30 tablet 0  . fish oil-omega-3 fatty acids 1000 MG capsule Take 1 g by mouth daily.     . fluticasone (FLONASE) 50 MCG/ACT nasal spray Place 1 spray into both nostrils 2 (two) times daily as needed for allergies or rhinitis. 16 g 0  . furosemide (LASIX) 40 MG tablet Take 1 tablet (40 mg total) by mouth daily as needed. 30 tablet 3  . levothyroxine (SYNTHROID, LEVOTHROID) 75 MCG tablet TAKE 1 TABLET ONCE DAILY. 30 tablet 6  . metoprolol succinate (TOPROL-XL) 50 MG 24 hr tablet TAKE 1 TABLET DAILY, TAKE WITH OR IMMEDIATELY FOLLOWING A MEAL 30 tablet 0  . metoprolol succinate (TOPROL-XL) 50 MG 24 hr tablet TAKE 1 TABLET DAILY, TAKE WITH OR IMMEDIATELY FOLLOWING A MEAL 30 tablet 6  . mupirocin ointment (BACTROBAN) 2 % Place 1 application into the nose 2 (two) times daily. 22 g 0  . PROCTOSOL HC  2.5 % rectal cream APPLY RECTALLY DAILY AS NEEDED FOR HEMRRHOIDS OR ITCHING. 28.35 g 0  . rosuvastatin (CRESTOR) 5 MG tablet Take 1 tablet (5 mg total) by mouth every Monday, Wednesday, and Friday. 35 tablet 0  . XARELTO 20 MG TABS tablet TAKE 1 TABLET EVERYDAY WITH SUPPER 30 tablet 4   No current facility-administered medications for this visit.    Allergies:  Daypro [oxaprozin]; Diclofenac sodium; Effexor [venlafaxine hydrochloride]; Penicillins; Prozac [fluoxetine hcl]; Sulfa antibiotics; Amitriptyline; Cymbalta [duloxetine hcl]; Lipitor [atorvastatin calcium]; Milnacipran hcl; Savella [milnacipran hcl]; and Zocor [simvastatin]   Social History: The patient  reports that she has never smoked. She has never used  smokeless tobacco. She reports that she does not drink alcohol or use drugs.   ROS:  Please see the history of present illness. Otherwise, complete review of systems is positive for leg cramps.  All other systems are reviewed and negative.   Physical Exam: VS:  BP 118/68 (BP Location: Left Arm)   Pulse 93   Ht 5\' 6"  (1.676 m)   Wt 210 lb (95.3 kg)   SpO2 97%   BMI 33.89 kg/m , BMI Body mass index is 33.89 kg/m.  Wt Readings from Last 3 Encounters:  02/03/17 210 lb (95.3 kg)  12/28/16 214 lb (97.1 kg)  10/14/16 209 lb (94.8 kg)    General: Overweight woman, appears comfortable at rest. HEENT: Conjunctiva and lids normal, oropharynx clear. Neck: Supple, no elevated JVP or carotid bruits, no thyromegaly. Lungs: Clear to auscultation, nonlabored breathing at rest. Cardiac: Regular rate and rhythm, no S3 or significant systolic murmur, no pericardial rub. Abdomen: Soft, nontender, bowel sounds present. Extremities: Mild ankle edema, distal pulses 2+.  ECG: I personally reviewed the tracing from 08/02/2016 which showed atrial flutter with variable conduction.  Recent Labwork: 03/30/2016: B Natriuretic Peptide 61.0 05/05/2016: TSH 1.840 08/02/2016: Magnesium 2.1 10/14/2016: ALT 14; AST 15; BUN 11; Creatinine, Ser 0.66; Hemoglobin 12.8; Platelets 174; Potassium 4.6; Sodium 141     Component Value Date/Time   CHOL 221 (H) 10/14/2016 1010   TRIG 161 (H) 10/14/2016 1010   HDL 54 10/14/2016 1010   CHOLHDL 3.6 10/16/2008 0540   VLDL 23 10/16/2008 0540   LDLCALC 133 12/02/2014   LDLCALC 138 (H) 02/26/2014 1001    Other Studies Reviewed Today:  Echocardiogram 08/20/2014: Study Conclusions  - Left ventricle: The cavity size was normal. Wall thickness was increased in a pattern of mild LVH. Systolic function was normal. The estimated ejection fraction was in the range of 60% to 65%. Doppler parameters are consistent with abnormal left ventricular relaxation (grade 1 diastolic  dysfunction). - Aortic valve: There was mild regurgitation. Valve area (VTI): 2.38 cm^2. Valve area (Vmax): 2.32 cm^2. - Atrial septum: No defect or patent foramen ovale was identified. - Systemic veins: IVC is dilated with normal respiratory variation, estimated RA pressure is 8 mmHg. - Technically difficult study.  Assessment and Plan:  1. Paroxysmal atrial fibrillation/flutter with CHADSVASC score of 3. She is in sinus rhythm today reports no interval palpitations. Continue with current medications including Toprol-XL and Xarelto.  2. Essential hypertension, blood pressure is well controlled today.  3. Mixed hyperlipidemia, continues on Crestor with follow-up per Dr. Laurance Flatten.  Current medicines were reviewed with the patient today.   Orders Placed This Encounter  Procedures  . EKG 12-Lead    Disposition: Follow-up in 6 months.   Signed, Satira Sark, MD, [facility] 02/03/2017 9:48 AM    [facility] [place]. [address], [place], [place] [address] Phone: [phone]; Fax: [phone]

## 2017-02-03 ENCOUNTER — Ambulatory Visit (INDEPENDENT_AMBULATORY_CARE_PROVIDER_SITE_OTHER): Payer: Medicare Other | Admitting: Cardiology

## 2017-02-03 ENCOUNTER — Encounter: Payer: Self-pay | Admitting: Cardiology

## 2017-02-03 VITALS — BP 118/68 | HR 93 | Ht 66.0 in | Wt 210.0 lb

## 2017-02-03 DIAGNOSIS — E782 Mixed hyperlipidemia: Secondary | ICD-10-CM | POA: Diagnosis not present

## 2017-02-03 DIAGNOSIS — I10 Essential (primary) hypertension: Secondary | ICD-10-CM | POA: Diagnosis not present

## 2017-02-03 DIAGNOSIS — I480 Paroxysmal atrial fibrillation: Secondary | ICD-10-CM | POA: Diagnosis not present

## 2017-02-03 NOTE — Patient Instructions (Signed)
Your physician wants you to follow-up in: 6 months with Dr.McDowell You will receive a reminder letter in the mail two months in advance. If you don't receive a letter, please call our office to schedule the follow-up appointment.    Your physician recommends that you continue on your current medications as directed. Please refer to the Current Medication list given to you today.     If you need a refill on your cardiac medications before your next appointment, please call your pharmacy.     No testing ordered today.    Thank you for choosing [facility] !         

## 2017-02-11 ENCOUNTER — Encounter: Payer: Self-pay | Admitting: *Deleted

## 2017-02-25 ENCOUNTER — Ambulatory Visit (INDEPENDENT_AMBULATORY_CARE_PROVIDER_SITE_OTHER): Payer: Medicare Other | Admitting: Family Medicine

## 2017-02-25 ENCOUNTER — Encounter: Payer: Self-pay | Admitting: Family Medicine

## 2017-02-25 VITALS — BP 130/62 | HR 67 | Temp 97.6°F | Ht 65.0 in | Wt 209.0 lb

## 2017-02-25 DIAGNOSIS — R5381 Other malaise: Secondary | ICD-10-CM

## 2017-02-25 DIAGNOSIS — Z789 Other specified health status: Secondary | ICD-10-CM | POA: Diagnosis not present

## 2017-02-25 DIAGNOSIS — S301XXA Contusion of abdominal wall, initial encounter: Secondary | ICD-10-CM | POA: Diagnosis not present

## 2017-02-25 DIAGNOSIS — E039 Hypothyroidism, unspecified: Secondary | ICD-10-CM | POA: Diagnosis not present

## 2017-02-25 DIAGNOSIS — E7800 Pure hypercholesterolemia, unspecified: Secondary | ICD-10-CM

## 2017-02-25 DIAGNOSIS — I10 Essential (primary) hypertension: Secondary | ICD-10-CM

## 2017-02-25 DIAGNOSIS — M797 Fibromyalgia: Secondary | ICD-10-CM

## 2017-02-25 DIAGNOSIS — K649 Unspecified hemorrhoids: Secondary | ICD-10-CM

## 2017-02-25 DIAGNOSIS — I480 Paroxysmal atrial fibrillation: Secondary | ICD-10-CM | POA: Diagnosis not present

## 2017-02-25 DIAGNOSIS — R5383 Other fatigue: Secondary | ICD-10-CM | POA: Diagnosis not present

## 2017-02-25 DIAGNOSIS — E559 Vitamin D deficiency, unspecified: Secondary | ICD-10-CM

## 2017-02-25 MED ORDER — HYDROCORTISONE 2.5 % RE CREA: TOPICAL_CREAM | RECTAL | 2 refills | Status: DC

## 2017-02-25 NOTE — Progress Notes (Signed)
Subjective:    Patient ID: Jenna [name], female    DOB: 07/10/1935, 81 y.o.   MRN: [id number]  HPI Pt here for follow up and management of chronic medical problems which includes hyperlipidemia and hypertension. She is taking medication regularly. The patient complains of soreness in her right abdomen since a fall on Wednesday. She also has some leg cramps and soreness. She is requesting a refill on some hemorrhoid cream which we will do. She is due to return an FOBT and get lab work. She also refuses to take her flu shot. The patient is pleasant and smiling. She says her biggest problem is the fibromyalgia and decreased energy that is associated with that. She has general muscle aches and pains. She denies any chest pain shortness of breath or palpitations. She sees Dr. Domenic Polite, the cardiologist yearly. She has no trouble with swallowing heartburn or indigestion and there's been no change in bowel habits. She does have some bright red blood in the stool that comes from hemorrhoids. She had a colonoscopy in January 2015 and everything was good with this. She is passing her water without problems. The abdominal discomfort that she is having is more tenderness and soreness from the bruise that she sustained to the right side of the abdomen when she fell      Patient Active Problem List   Diagnosis Date Noted  . Right knee pain 01/30/2015  . Hypertension   . Precordial pain 02/05/2014  . Palpitations 02/05/2014  . History of shingles 01/11/2014  . Pre-diabetes 11/21/2013  . Obesity (BMI 30.0-34.9) 11/21/2013  . Mixed hypercholesterolemia and hypertriglyceridemia 11/21/2013  . Metabolic syndrome 37/85/8850  . Multiple drug allergies 10/23/2013  . Statin intolerance 10/23/2013  . Osteopenia of the elderly 07/25/2013  . Vitamin D deficiency 06/19/2013  . Unspecified constipation 04/23/2013  . Rectal bleeding 04/23/2013  . Gout 02/21/2013  . Osteoarthritis 02/21/2013  . Fibromyalgia   .  Hyperlipidemia 11/14/2008   Outpatient Encounter Prescriptions as of 02/25/2017  Medication Sig  . cetirizine (ZYRTEC) 5 MG tablet Take 1 tablet (5 mg total) by mouth daily.  . Cholecalciferol (VITAMIN D3) 2000 UNITS TABS Take 1 tablet by mouth daily.    . colchicine 0.6 MG tablet Take 1 tablet (0.6 mg total) by mouth daily as needed (GOUT FLARE UP).  . fluticasone (FLONASE) 50 MCG/ACT nasal spray Place 1 spray into both nostrils 2 (two) times daily as needed for allergies or rhinitis.  . furosemide (LASIX) 40 MG tablet Take 1 tablet (40 mg total) by mouth daily as needed.  [name] levothyroxine (SYNTHROID, LEVOTHROID) 75 MCG tablet TAKE 1 TABLET ONCE DAILY.  . metoprolol succinate (TOPROL-XL) 50 MG 24 hr tablet TAKE 1 TABLET DAILY, TAKE WITH OR IMMEDIATELY FOLLOWING A MEAL  . mupirocin ointment (BACTROBAN) 2 % Place 1 application into the nose 2 (two) times daily.  [name] PROCTOSOL HC 2.5 % rectal cream APPLY RECTALLY DAILY AS NEEDED FOR HEMRRHOIDS OR ITCHING.  . rosuvastatin (CRESTOR) 5 MG tablet Take 1 tablet (5 mg total) by mouth every Monday, Wednesday, and Friday.  Alveda Reasons 20 MG TABS tablet TAKE 1 TABLET EVERYDAY WITH SUPPER  . fish oil-omega-3 fatty acids 1000 MG capsule Take 1 g by mouth daily.   . [DISCONTINUED] metoprolol succinate (TOPROL-XL) 50 MG 24 hr tablet TAKE 1 TABLET DAILY, TAKE WITH OR IMMEDIATELY FOLLOWING A MEAL   No facility-administered encounter medications on file as of 02/25/2017.      Review of Systems  Constitutional: Negative.   HENT: Negative.   Eyes: Negative.   Respiratory: Negative.   Cardiovascular: Negative.   Gastrointestinal: Negative.        Sore area right abd - from fall this past WED  Endocrine: Negative.   Genitourinary: Negative.   Musculoskeletal: Negative.        Leg cramps and soreness  Skin: Negative.   Allergic/Immunologic: Negative.   Neurological: Negative.   Hematological: Negative.   Psychiatric/Behavioral: Negative.        Objective:    Physical Exam  Constitutional: She is oriented to person, place, and time. She appears well-developed and well-nourished. No distress.  The patient is pleasant and alert and looks younger than her stated age  HENT:  Head: Normocephalic and atraumatic.  Right Ear: External ear normal.  Left Ear: External ear normal.  Nose: Nose normal.  Mouth/Throat: No oropharyngeal exudate.  Eyes: Pupils are equal, round, and reactive to light. Conjunctivae and EOM are normal. Right eye exhibits no discharge. Left eye exhibits no discharge. No scleral icterus.  Patient acknowledges that she needs to get an eye exam and plans to do this.  Neck: Normal range of motion. Neck supple. No thyromegaly present.  No bruits thyromegaly or anterior cervical adenopathy  Cardiovascular: Normal rate, regular rhythm, normal heart sounds and intact distal pulses.   No murmur heard. The heart is regular at 60/m  Pulmonary/Chest: Effort normal and breath sounds normal. No respiratory distress. She has no wheezes. She has no rales. She exhibits no tenderness.  Clear anteriorly and posteriorly  Abdominal: Soft. Bowel sounds are normal. She exhibits no mass. There is no tenderness. There is no rebound and no guarding.  No abdominal tenderness masses or organ enlargement or bruits  Musculoskeletal: Normal range of motion. She exhibits no edema.  Lymphadenopathy:    She has no cervical adenopathy.  Neurological: She is alert and oriented to person, place, and time. She has normal reflexes. No cranial nerve deficit.  Skin: Skin is warm and dry. No rash noted.  Contusion right lower abdomen wall  Psychiatric: She has a normal mood and affect. Her behavior is normal. Judgment and thought content normal.  Nursing note and vitals reviewed.  BP 130/62 (BP Location: Left Arm)   Pulse 67   Temp 97.6 F (36.4 C) (Oral)   Ht 5' 5"  (1.651 m)   Wt 209 lb (94.8 kg)   BMI 34.78 kg/m        Assessment & Plan:  1. Pure  hypercholesterolemia -The patient is statin intolerant and will continue with her current routine of diet and exercise as much as possible pending results of lab work - CBC with Differential/Platelet - Lipid panel  2. Essential hypertension -The blood pressure is good and she will continue with current treatment - CBC with Differential/Platelet - BMP8+EGFR - Hepatic function panel  3. Fibromyalgia -Continue with walking and low impact exercise as much as possible and Tylenol as needed for pain - CBC with Differential/Platelet  4. Hypothyroidism, unspecified type - CBC with Differential/Platelet - Thyroid Panel With TSH  5. Paroxysmal atrial fibrillation (HCC) -Follow-up with cardiology as planned - CBC with Differential/Platelet  6. Vitamin D deficiency -Continue current treatment pending results of lab work - CBC with Differential/Platelet - VITAMIN D 25 Hydroxy (Vit-D Deficiency, Fractures)  7. Contusion of abdominal wall, initial encounter -This appears to be resolving and there are no problems with bowel habits or voiding. She will continue to monitor this and return to  the office if she gets worse.  8. Malaise and fatigue -Check lab work stay active and drink plenty of fluids  9. Hemorrhoids, unspecified hemorrhoid type -A recent colonoscopy was negative and she acknowledges ongoing problems with hemorrhoids with bright red blood at times.  10. Statin intolerance -Continue with aggressive therapeutic lifestyle changes  Meds ordered this encounter  Medications  . hydrocortisone (PROCTOSOL HC) 2.5 % rectal cream    Sig: APPLY RECTALLY DAILY AS NEEDED FOR HEMRRHOIDS OR ITCHING.    Dispense:  28.35 g    Refill:  2   Patient Instructions                       Medicare Annual Wellness Visit  [facility] and the medical providers at [facility] strive to bring you the best medical care.  In doing so we not only want to address your current  medical conditions and concerns but also to detect new conditions early and prevent illness, disease and health-related problems.    Medicare offers a yearly Wellness Visit which allows our clinical staff to assess your need for preventative services including immunizations, lifestyle education, counseling to decrease risk of preventable diseases and screening for fall risk and other medical concerns.    This visit is provided free of charge (no copay) for all Medicare recipients. The clinical pharmacists at [facility] have begun to conduct these Wellness Visits which will also include a thorough review of all your medications.    As you primary medical provider recommend that you make an appointment for your Annual Wellness Visit if you have not done so already this year.  You may set up this appointment before you leave today or you may call back ([phone]) and schedule an appointment.  Please make sure when you call that you mention that you are scheduling your Annual Wellness Visit with the clinical pharmacist so that the appointment may be made for the proper length of time.     Continue current medications. Continue good therapeutic lifestyle changes which include good diet and exercise. Fall precautions discussed with patient. If an FOBT was given today- please return it to our front desk. If you are over 34 years old - you may need Prevnar 58 or the adult Pneumonia vaccine.  **Flu shots are available--- please call and schedule a FLU-CLINIC appointment**  After your visit with [place] today you will receive a survey in the mail or online from [employer] regarding your care with [place]. Please take a moment to fill this out. Your feedback is very important to [place] as you can help [place] better understand your patient needs as well as improve your experience and satisfaction. WE CARE ABOUT YOU!!!   Stay as active as possible with walking and exercise Drink plenty of fluids B  careful do not put yourself at risk for falling Do not do any climbing Follow-up with cardiology as planned Do not forget to get an appointment to have your eyes examined  Arrie Senate MD

## 2017-02-25 NOTE — Patient Instructions (Addendum)
Medicare Annual Wellness Visit  [facility] and the medical providers at [facility] strive to bring you the best medical care.  In doing so we not only want to address your current medical conditions and concerns but also to detect new conditions early and prevent illness, disease and health-related problems.    Medicare offers a yearly Wellness Visit which allows our clinical staff to assess your need for preventative services including immunizations, lifestyle education, counseling to decrease risk of preventable diseases and screening for fall risk and other medical concerns.    This visit is provided free of charge (no copay) for all Medicare recipients. The clinical pharmacists at [facility] have begun to conduct these Wellness Visits which will also include a thorough review of all your medications.    As you primary medical provider recommend that you make an appointment for your Annual Wellness Visit if you have not done so already this year.  You may set up this appointment before you leave today or you may call back ([phone]) and schedule an appointment.  Please make sure when you call that you mention that you are scheduling your Annual Wellness Visit with the clinical pharmacist so that the appointment may be made for the proper length of time.     Continue current medications. Continue good therapeutic lifestyle changes which include good diet and exercise. Fall precautions discussed with patient. If an FOBT was given today- please return it to our front desk. If you are over 13 years old - you may need Prevnar 68 or the adult Pneumonia vaccine.  **Flu shots are available--- please call and schedule a FLU-CLINIC appointment**  After your visit with [place] today you will receive a survey in the mail or online from [employer] regarding your care with [place]. Please take a moment to fill this out. Your feedback is very  important to [place] as you can help [place] better understand your patient needs as well as improve your experience and satisfaction. WE CARE ABOUT YOU!!!   Stay as active as possible with walking and exercise Drink plenty of fluids B careful do not put yourself at risk for falling Do not do any climbing Follow-up with cardiology as planned Do not forget to get an appointment to have your eyes examined

## 2017-02-26 LAB — THYROID PANEL WITH TSH
FREE THYROXINE INDEX: 2.5 (ref 1.2–4.9)
T3 Uptake Ratio: 26 % (ref 24–39)
T4, Total: 9.8 ug/dL (ref 4.5–12.0)
TSH: 1.85 u[IU]/mL (ref 0.450–4.500)

## 2017-02-26 LAB — CBC WITH DIFFERENTIAL/PLATELET
BASOS ABS: 0 10*3/uL (ref 0.0–0.2)
Basos: 0 %
EOS (ABSOLUTE): 0.1 10*3/uL (ref 0.0–0.4)
EOS: 2 %
HEMATOCRIT: 37.4 % (ref 34.0–46.6)
HEMOGLOBIN: 12.4 g/dL (ref 11.1–15.9)
IMMATURE GRANS (ABS): 0 10*3/uL (ref 0.0–0.1)
Immature Granulocytes: 0 %
LYMPHS ABS: 1.2 10*3/uL (ref 0.7–3.1)
LYMPHS: 23 %
MCH: 34.9 pg — AB (ref 26.6–33.0)
MCHC: 33.2 g/dL (ref 31.5–35.7)
MCV: 105 fL — ABNORMAL HIGH (ref 79–97)
MONOCYTES: 7 %
Monocytes Absolute: 0.4 10*3/uL (ref 0.1–0.9)
NEUTROS ABS: 3.4 10*3/uL (ref 1.4–7.0)
Neutrophils: 68 %
Platelets: 183 10*3/uL (ref 150–379)
RBC: 3.55 x10E6/uL — ABNORMAL LOW (ref 3.77–5.28)
RDW: 16.1 % — ABNORMAL HIGH (ref 12.3–15.4)
WBC: 5.1 10*3/uL (ref 3.4–10.8)

## 2017-02-26 LAB — LIPID PANEL
CHOL/HDL RATIO: 3.7 ratio (ref 0.0–4.4)
CHOLESTEROL TOTAL: 209 mg/dL — AB (ref 100–199)
HDL: 56 mg/dL (ref >39)
LDL CALC: 126 mg/dL — AB (ref 0–99)
TRIGLYCERIDES: 133 mg/dL (ref 0–149)
VLDL CHOLESTEROL CAL: 27 mg/dL (ref 5–40)

## 2017-02-26 LAB — BMP8+EGFR
BUN / CREAT RATIO: 13 (ref 12–28)
BUN: 9 mg/dL (ref 8–27)
CO2: 26 mmol/L (ref 20–29)
CREATININE: 0.7 mg/dL (ref 0.57–1.00)
Calcium: 9 mg/dL (ref 8.7–10.3)
Chloride: 98 mmol/L (ref 96–106)
GFR calc Af Amer: 95 mL/min/{1.73_m2} (ref >59)
GFR, EST NON AFRICAN AMERICAN: 83 mL/min/{1.73_m2} (ref >59)
GLUCOSE: 109 mg/dL — AB (ref 65–99)
Potassium: 4.6 mmol/L (ref 3.5–5.2)
Sodium: 140 mmol/L (ref 134–144)

## 2017-02-26 LAB — HEPATIC FUNCTION PANEL
ALBUMIN: 4.2 g/dL (ref 3.5–4.8)
ALK PHOS: 93 IU/L (ref 39–117)
ALT: 15 IU/L (ref 0–32)
AST: 16 IU/L (ref 0–40)
BILIRUBIN TOTAL: 0.7 mg/dL (ref 0.0–1.2)
BILIRUBIN, DIRECT: 0.2 mg/dL (ref 0.00–0.40)
TOTAL PROTEIN: 6 g/dL (ref 6.0–8.5)

## 2017-02-26 LAB — VITAMIN D 25 HYDROXY (VIT D DEFICIENCY, FRACTURES): Vit D, 25-Hydroxy: 49.7 ng/mL (ref 30.0–100.0)

## 2017-02-28 ENCOUNTER — Other Ambulatory Visit: Payer: Self-pay | Admitting: Cardiology

## 2017-03-14 ENCOUNTER — Telehealth: Payer: Self-pay | Admitting: Family Medicine

## 2017-03-14 MED ORDER — AZITHROMYCIN 250 MG PO TABS: ORAL_TABLET | ORAL | 0 refills | Status: DC

## 2017-03-14 NOTE — Telephone Encounter (Signed)
Do a Z-Pak and take as directed and drink plenty of fluids and use Mucinex as needed for cough and congestion with a large glass of water

## 2017-03-14 NOTE — Telephone Encounter (Signed)
Pt aware.

## 2017-03-14 NOTE — Telephone Encounter (Signed)
[facility] office visit 02-25-17.  Please advise if antibiotics will be sent to pharmacy.

## 2017-04-05 ENCOUNTER — Other Ambulatory Visit: Payer: Self-pay

## 2017-04-05 ENCOUNTER — Emergency Department (HOSPITAL_COMMUNITY)
Admission: EM | Admit: 2017-04-05 | Discharge: 2017-04-06 | Disposition: A | Discharge status: Home / Self Care | Payer: Medicare Other | Attending: Emergency Medicine | Admitting: Emergency Medicine

## 2017-04-05 ENCOUNTER — Encounter (HOSPITAL_COMMUNITY): Payer: Self-pay | Admitting: Emergency Medicine

## 2017-04-05 DIAGNOSIS — I10 Essential (primary) hypertension: Secondary | ICD-10-CM | POA: Insufficient documentation

## 2017-04-05 DIAGNOSIS — Z79899 Other long term (current) drug therapy: Secondary | ICD-10-CM | POA: Insufficient documentation

## 2017-04-05 DIAGNOSIS — I4892 Unspecified atrial flutter: Secondary | ICD-10-CM | POA: Insufficient documentation

## 2017-04-05 DIAGNOSIS — E039 Hypothyroidism, unspecified: Secondary | ICD-10-CM | POA: Insufficient documentation

## 2017-04-05 DIAGNOSIS — R002 Palpitations: Secondary | ICD-10-CM | POA: Diagnosis present

## 2017-04-05 DIAGNOSIS — Z7901 Long term (current) use of anticoagulants: Secondary | ICD-10-CM | POA: Insufficient documentation

## 2017-04-05 NOTE — ED Triage Notes (Signed)
Pt states her chest has been feeling funny since 2000 tonight. Pt denies any pain but states her heart is racing.

## 2017-04-05 NOTE — ED Notes (Signed)
Pt denies Chest Pain

## 2017-04-06 DIAGNOSIS — I4892 Unspecified atrial flutter: Secondary | ICD-10-CM | POA: Diagnosis not present

## 2017-04-06 LAB — URINALYSIS, ROUTINE W REFLEX MICROSCOPIC
Bilirubin Urine: NEGATIVE
GLUCOSE, UA: NEGATIVE mg/dL
HGB URINE DIPSTICK: NEGATIVE
Ketones, ur: NEGATIVE mg/dL
LEUKOCYTES UA: NEGATIVE
Nitrite: NEGATIVE
Protein, ur: NEGATIVE mg/dL
SPECIFIC GRAVITY, URINE: 1.002 — AB (ref 1.005–1.030)
pH: 7 (ref 5.0–8.0)

## 2017-04-06 LAB — CBC
HCT: 38.2 % (ref 36.0–46.0)
Hemoglobin: 13 g/dL (ref 12.0–15.0)
MCH: 36.1 pg — AB (ref 26.0–34.0)
MCHC: 34 g/dL (ref 30.0–36.0)
MCV: 106.1 fL — ABNORMAL HIGH (ref 78.0–100.0)
Platelets: 163 10*3/uL (ref 150–400)
RBC: 3.6 MIL/uL — ABNORMAL LOW (ref 3.87–5.11)
RDW: 12.7 % (ref 11.5–15.5)
WBC: 5.4 10*3/uL (ref 4.0–10.5)

## 2017-04-06 LAB — BASIC METABOLIC PANEL
Anion gap: 7 (ref 5–15)
BUN: 10 mg/dL (ref 6–20)
CO2: 30 mmol/L (ref 22–32)
CREATININE: 0.64 mg/dL (ref 0.44–1.00)
Calcium: 8.9 mg/dL (ref 8.9–10.3)
Chloride: 100 mmol/L — ABNORMAL LOW (ref 101–111)
Glucose, Bld: 123 mg/dL — ABNORMAL HIGH (ref 65–99)
Potassium: 4 mmol/L (ref 3.5–5.1)
SODIUM: 137 mmol/L (ref 135–145)

## 2017-04-06 LAB — MAGNESIUM: MAGNESIUM: 2 mg/dL (ref 1.7–2.4)

## 2017-04-06 NOTE — ED Provider Notes (Signed)
[facility] EMERGENCY DEPARTMENT Provider Note   CSN: [id number] Arrival date & time: 04/05/17  2323     History   Chief Complaint Chief Complaint  Patient presents with  . Palpitations    HPI Jenna [name] is a 81 y.o. female.  The history is provided by the patient and the spouse.  Palpitations   This is a new problem. The current episode started 1 to 2 hours ago. The problem occurs constantly. The problem has been gradually improving. Associated symptoms include irregular heartbeat, dizziness and shortness of breath. Pertinent negatives include no fever, no chest pain, no syncope and no vomiting. She has tried nothing for the symptoms. Risk factors: h/o atrial fibrillation.   Pt reports onset of palpitations/dizziness/shortness of breath about 2 hrs prior to arrival It is now improved She had otherwise been well She has had this before  She has h/o afib, and currently taking xarelto, no missed doses  Past Medical History:  Diagnosis Date  . Cataract   . DDD (degenerative disc disease)   . Fibromyalgia   . Herpes zoster   . History of skin cancer   . Hyperlipidemia   . Hypertension   . Hypothyroidism   . OA (osteoarthritis)   . Osteoporosis   . Palpitations   . Pre-diabetes   . PUD (peptic ulcer disease)   . Skin cancer of nose    [name]    Patient Active Problem List   Diagnosis Date Noted  . Right knee pain 01/30/2015  . Hypertension   . Precordial pain 02/05/2014  . Palpitations 02/05/2014  . History of shingles 01/11/2014  . Pre-diabetes 11/21/2013  . Obesity (BMI 30.0-34.9) 11/21/2013  . Mixed hypercholesterolemia and hypertriglyceridemia 11/21/2013  . Metabolic syndrome 24/01/7352  . Multiple drug allergies 10/23/2013  . Statin intolerance 10/23/2013  . Osteopenia of the elderly 07/25/2013  . Vitamin D deficiency 06/19/2013  . Unspecified constipation 04/23/2013  . Rectal bleeding 04/23/2013  . Gout 02/21/2013  . Osteoarthritis  02/21/2013  . Fibromyalgia   . Hyperlipidemia 11/14/2008    Past Surgical History:  Procedure Laterality Date  . ABDOMINAL HYSTERECTOMY     partial  . BREAST BIOPSY     Right  . CESAREAN SECTION    . EYE SURGERY     skin removal   . FRACTURE SURGERY  1952   fractured right arm    OB History    No data available       Home Medications    Prior to Admission medications   Medication Sig Start Date End Date Taking? Authorizing Provider  cetirizine (ZYRTEC) 5 MG tablet Take 1 tablet (5 mg total) by mouth daily. 07/24/16  Yes Eustaquio Maize, MD  Cholecalciferol (VITAMIN D3) 2000 UNITS TABS Take 1 tablet by mouth daily.     Yes [provider]  colchicine 0.6 MG tablet Take 1 tablet (0.6 mg total) by mouth daily as needed (GOUT FLARE UP). 12/27/16  Yes Chipper Herb, MD  fish oil-omega-3 fatty acids 1000 MG capsule Take 1 g by mouth daily.    Yes [provider]  fluticasone (FLONASE) 50 MCG/ACT nasal spray Place 1 spray into both nostrils 2 (two) times daily as needed for allergies or rhinitis. 07/24/16  Yes Eustaquio Maize, MD  furosemide (LASIX) 40 MG tablet Take 1 tablet (40 mg total) by mouth daily as needed. 12/28/16  Yes Timmothy Euler, MD  hydrocortisone (PROCTOSOL HC) 2.5 % rectal cream APPLY RECTALLY  DAILY AS NEEDED FOR HEMRRHOIDS OR ITCHING. 02/25/17  Yes Chipper Herb, MD  levothyroxine (SYNTHROID, LEVOTHROID) 75 MCG tablet TAKE 1 TABLET ONCE DAILY. 10/19/16  Yes Chipper Herb, MD  metoprolol succinate (TOPROL-XL) 50 MG 24 hr tablet TAKE 1 TABLET DAILY, TAKE WITH OR IMMEDIATELY FOLLOWING A MEAL 01/03/17  Yes Satira Sark, MD  metoprolol succinate (TOPROL-XL) 50 MG 24 hr tablet TAKE 1 TABLET DAILY, TAKE WITH OR IMMEDIATELY FOLLOWING A MEAL 02/28/17  Yes Satira Sark, MD  mupirocin ointment (BACTROBAN) 2 % Place 1 application into the nose 2 (two) times daily. 10/14/16  Yes Chipper Herb, MD  rosuvastatin (CRESTOR) 5 MG tablet Take 1 tablet (5  mg total) by mouth every Monday, Wednesday, and Friday. 11/03/16  Yes Cherre Robins, PharmD  XARELTO 20 MG TABS tablet TAKE 1 TABLET EVERYDAY WITH SUPPER 01/11/17  Yes Satira Sark, MD  azithromycin [facility]) 250 MG tablet As directed 03/14/17   Chipper Herb, MD    Family History Family History  Problem Relation Age of Onset  . CVA Mother   . Stroke Mother   . Osteoporosis Mother   . Cancer Father        prostate  . Bronchitis Sister   . COPD Sister   . Early death Brother        died in [place]  . Carpal tunnel syndrome Sister   . Cancer Brother        throat  . Cancer Brother        lung  . Cancer Brother        throat  . Stroke Brother   . Heart attack Brother   . Gout Brother   . Heart Problems Brother        stents  . CAD Brother   . Hypertension Brother   . Gout Brother   . Arthritis Brother   . Diabetes Son   . Coronary artery disease Neg Hx        No premature  . Anesthesia problems Neg Hx   . Hypotension Neg Hx   . Malignant hyperthermia Neg Hx   . Pseudochol deficiency Neg Hx     Social History Social History   Tobacco Use  . Smoking status: Never Smoker  . Smokeless tobacco: Never Used  Substance Use Topics  . Alcohol use: No  . Drug use: No     Allergies   Daypro [oxaprozin]; Diclofenac sodium; Effexor [venlafaxine hydrochloride]; Penicillins; Prozac [fluoxetine hcl]; Sulfa antibiotics; Amitriptyline; Cymbalta [duloxetine hcl]; Lipitor [atorvastatin calcium]; Milnacipran hcl; Savella [milnacipran hcl]; and Zocor [simvastatin]   Review of Systems Review of Systems  Constitutional: Positive for fatigue. Negative for fever.  Respiratory: Positive for shortness of breath.   Cardiovascular: Positive for palpitations. Negative for chest pain and syncope.  Gastrointestinal: Negative for vomiting.  Neurological: Positive for dizziness. Negative for syncope.  All other systems reviewed and are negative.    Physical Exam Updated Vital  Signs BP (!) 162/87   Pulse (!) 105   Temp 97.7 F (36.5 C)   Resp (!) 22   Ht 1.651 m (5\' 5" )   Wt 95.3 kg (210 lb)   SpO2 96%   BMI 34.95 kg/m   Physical Exam CONSTITUTIONAL: Well developed/well nourished HEAD: Normocephalic/atraumatic EYES: EOMI ENMT: Mucous membranes moist NECK: supple no meningeal signs SPINE/BACK:entire spine nontender CV: S1/S2 noted, no murmurs/rubs/gallops noted LUNGS: Lungs are clear to auscultation bilaterally, no apparent distress ABDOMEN: soft, nontender, no rebound  or guarding, bowel sounds noted throughout abdomen GU:no cva tenderness NEURO: Pt is awake/alert/appropriate, moves all extremitiesx4.  No facial droop.   EXTREMITIES: pulses normal/equal, full ROM, no calf tenderness SKIN: warm, color normal PSYCH: no abnormalities of mood noted, alert and oriented to situation   ED Treatments / Results  Labs (all labs ordered are listed, but only abnormal results are displayed) Labs Reviewed  BASIC METABOLIC PANEL - Abnormal; Notable for the following components:      Result Value   Chloride 100 (*)    Glucose, Bld 123 (*)    All other components within normal limits  CBC - Abnormal; Notable for the following components:   RBC 3.60 (*)    MCV 106.1 (*)    MCH 36.1 (*)    All other components within normal limits  URINALYSIS, ROUTINE W REFLEX MICROSCOPIC - Abnormal; Notable for the following components:   Color, Urine COLORLESS (*)    Specific Gravity, Urine 1.002 (*)    All other components within normal limits  MAGNESIUM    EKG  EKG Interpretation  Date/Time:  Tuesday April 05 2017 23:31:17 EST Ventricular Rate:  117 PR Interval:    QRS Duration: 77 QT Interval:  328 QTC Calculation: 458 R Axis:   48 Text Interpretation:  Atrial flutter with predominant 3:1 AV block Low voltage, precordial leads Minimal ST depression, diffuse leads Abnormal ekg Confirmed by Ripley Fraise [phone]) on 04/05/2017 11:36:05 PM       EKG  Interpretation  Date/Time:  Wednesday April 06 2017 00:09:07 EST Ventricular Rate:  77 PR Interval:    QRS Duration: 82 QT Interval:  378 QTC Calculation: 428 R Axis:   46 Text Interpretation:  Sinus rhythm Low voltage, precordial leads improved from prior no longer in aflutter Confirmed by Ripley Fraise [phone]) on 04/06/2017 12:27:21 AM       Radiology No results found.  Procedures Procedures (including critical care time)  Medications Ordered in ED Medications - No data to display   Initial Impression / Assessment and Plan / ED Course  I have reviewed the triage vital signs and the nursing notes.  Pertinent labs   results that were available during my care of the patient were reviewed by me and considered in my medical decision making (see chart for details).     1:29 AM Pt stable Now back in sinus rhythm on repeat EKG She is already on xarelto  This patients CHA2DS2-VASc Score and unadjusted Ischemic Stroke Rate (% per year) is equal to 3.2 % stroke rate/year from a score of 3  Above score calculated as 1 point each if present [CHF, HTN, DM, Vascular=MI/PAD/Aortic Plaque, Age if 65-74, or Female] Above score calculated as 2 points each if present [Age > 75, or Stroke/TIA/TE]  Labs reassuring Will monitor   2:18 AM Pt stable Reviewed monitoring, no new episodes of afib/flutter Will d/c home Pt feels well for discharge home Continue home meds and f/u with cardiology   Final Clinical Impressions(s) / ED Diagnoses   Final diagnoses:  Paroxysmal atrial flutter [facility])    ED Discharge Orders        Ordered    Amb referral to AFIB Clinic     04/05/17 2355       Ripley Fraise, MD 04/06/17 [phone]

## 2017-04-11 ENCOUNTER — Encounter (HOSPITAL_COMMUNITY): Payer: Self-pay | Admitting: Nurse Practitioner

## 2017-04-11 ENCOUNTER — Ambulatory Visit (HOSPITAL_COMMUNITY)
Admission: RE | Admit: 2017-04-11 | Discharge: 2017-04-11 | Disposition: A | Discharge status: Home / Self Care | Payer: Medicare Other | Source: Ambulatory Visit | Attending: Nurse Practitioner | Admitting: Nurse Practitioner

## 2017-04-11 ENCOUNTER — Telehealth: Payer: Self-pay | Admitting: *Deleted

## 2017-04-11 VITALS — BP 132/68 | HR 69 | Ht 65.0 in | Wt 214.0 lb

## 2017-04-11 DIAGNOSIS — M1990 Unspecified osteoarthritis, unspecified site: Secondary | ICD-10-CM | POA: Insufficient documentation

## 2017-04-11 DIAGNOSIS — I480 Paroxysmal atrial fibrillation: Secondary | ICD-10-CM | POA: Insufficient documentation

## 2017-04-11 DIAGNOSIS — Z8711 Personal history of peptic ulcer disease: Secondary | ICD-10-CM | POA: Insufficient documentation

## 2017-04-11 DIAGNOSIS — E039 Hypothyroidism, unspecified: Secondary | ICD-10-CM | POA: Insufficient documentation

## 2017-04-11 DIAGNOSIS — Z7901 Long term (current) use of anticoagulants: Secondary | ICD-10-CM | POA: Diagnosis not present

## 2017-04-11 DIAGNOSIS — Z85828 Personal history of other malignant neoplasm of skin: Secondary | ICD-10-CM | POA: Diagnosis not present

## 2017-04-11 DIAGNOSIS — M797 Fibromyalgia: Secondary | ICD-10-CM | POA: Diagnosis not present

## 2017-04-11 DIAGNOSIS — I4891 Unspecified atrial fibrillation: Secondary | ICD-10-CM | POA: Diagnosis present

## 2017-04-11 DIAGNOSIS — Z823 Family history of stroke: Secondary | ICD-10-CM | POA: Diagnosis not present

## 2017-04-11 DIAGNOSIS — Z8249 Family history of ischemic heart disease and other diseases of the circulatory system: Secondary | ICD-10-CM | POA: Diagnosis not present

## 2017-04-11 DIAGNOSIS — I10 Essential (primary) hypertension: Secondary | ICD-10-CM | POA: Insufficient documentation

## 2017-04-11 DIAGNOSIS — R7303 Prediabetes: Secondary | ICD-10-CM | POA: Diagnosis not present

## 2017-04-11 DIAGNOSIS — Z79899 Other long term (current) drug therapy: Secondary | ICD-10-CM | POA: Diagnosis not present

## 2017-04-11 DIAGNOSIS — E785 Hyperlipidemia, unspecified: Secondary | ICD-10-CM | POA: Diagnosis not present

## 2017-04-11 MED ORDER — DILTIAZEM HCL 30 MG PO TABS: ORAL_TABLET | ORAL | 1 refills | Status: DC

## 2017-04-11 NOTE — Telephone Encounter (Signed)
-----   Message from Juluis Mire, RN sent at 04/11/2017  1:49 PM EST ----- Regarding: sleep study Pt needs sleep study for afib - would prefer in [place]. Thanks! Marzetta Board

## 2017-04-11 NOTE — Addendum Note (Signed)
Encounter addended by: Sherran Needs, NP on: 04/11/2017 4:44 PM  Actions taken: Sign clinical note

## 2017-04-11 NOTE — Patient Instructions (Addendum)
Cardizem 30mg  -- take 1 tablet every 4 hours AS NEEDED for AFIB heart rate over 100 as long as the top number of your blood pressure is above 100.   Scheduler will be in touch with you for sleep study scheduling.

## 2017-04-11 NOTE — Progress Notes (Addendum)
Primary Care Physician: Chipper Herb, MD Referring Physician: [facility] ER f/u Cardiologist: Dr. Threasa Beards Jenna [name] is a 81 y.o. female with a h/o afib diagnosed 2 years ago. She was in the ER 11/13 for an episode of afib that started around 8:30 pm and would not go away after she went to bed. She returned to afib spontaneously in the ER. She is in the afib clinic for f/u.  She denies any triggers. She is on xarelto 20 mg for a chadsvasc score of 4. States that she has had 3 episodes of afib since diagnosed that she has presented to the ER.  She has never tried any prn meds for episodes of afib. On daily BB. No alcohol or tobacco use but her husband states that she does have apnea spells. Pt states that the other two episodes that she went to there ER about came on while she was sleeping.  Today, she denies symptoms of palpitations, chest pain, shortness of breath, orthopnea, PND, lower extremity edema, dizziness, presyncope, syncope, or neurologic sequela. The patient is tolerating medications without difficulties and is otherwise without complaint today.   Past Medical History:  Diagnosis Date  . Cataract   . DDD (degenerative disc disease)   . Fibromyalgia   . Herpes zoster   . History of skin cancer   . Hyperlipidemia   . Hypertension   . Hypothyroidism   . OA (osteoarthritis)   . Osteoporosis   . Palpitations   . Pre-diabetes   . PUD (peptic ulcer disease)   . Skin cancer of nose    [name]   Past Surgical History:  Procedure Laterality Date  . ABDOMINAL HYSTERECTOMY     partial  . BREAST BIOPSY     Right  . CATARACT EXTRACTION PHACO AND INTRAOCULAR LENS PLACEMENT ([place]) Left 02/14/2012   Performed by Tonny Branch, MD at [facility]  . CATARACT EXTRACTION PHACO AND INTRAOCULAR LENS PLACEMENT ([place]) Right 10/28/2011   Performed by Tonny Branch, MD at [facility]  . CESAREAN SECTION    . COLONOSCOPY N/A 05/31/2013   Performed by Rogene Houston, MD at [place]  . EYE  SURGERY     skin removal   . FRACTURE SURGERY  1952   fractured right arm    Current Outpatient Medications  Medication Sig Dispense Refill  . cetirizine (ZYRTEC) 5 MG tablet Take 1 tablet (5 mg total) by mouth daily. 30 tablet 0  . Cholecalciferol (VITAMIN D3) 2000 UNITS TABS Take 1 tablet by mouth daily.      . colchicine 0.6 MG tablet Take 1 tablet (0.6 mg total) by mouth daily as needed (GOUT FLARE UP). 30 tablet 0  . fluticasone (FLONASE) 50 MCG/ACT nasal spray Place 1 spray into both nostrils 2 (two) times daily as needed for allergies or rhinitis. 16 g 0  . furosemide (LASIX) 40 MG tablet Take 1 tablet (40 mg total) by mouth daily as needed. 30 tablet 3  . hydrocortisone (PROCTOSOL HC) 2.5 % rectal cream APPLY RECTALLY DAILY AS NEEDED FOR HEMRRHOIDS OR ITCHING. 28.35 g 2  . levothyroxine (SYNTHROID, LEVOTHROID) 75 MCG tablet TAKE 1 TABLET ONCE DAILY. 30 tablet 6  . metoprolol succinate (TOPROL-XL) 50 MG 24 hr tablet TAKE 1 TABLET DAILY, TAKE WITH OR IMMEDIATELY FOLLOWING A MEAL 30 tablet 0  . mupirocin ointment (BACTROBAN) 2 % Place 1 application into the nose 2 (two) times daily. 22 g 0  . XARELTO 20  MG TABS tablet TAKE 1 TABLET EVERYDAY WITH SUPPER 30 tablet 4  . diltiazem (CARDIZEM) 30 MG tablet Take 1 tablet every 4 hours AS NEEDED for AFIB heart rate over 100 45 tablet 1   No current facility-administered medications for this encounter.     Allergies  Allergen Reactions  . Daypro [Oxaprozin]   . Diclofenac Sodium   . Effexor [Venlafaxine Hydrochloride]   . Penicillins   . Prozac [Fluoxetine Hcl]   . Sulfa Antibiotics   . Amitriptyline Rash  . Cymbalta [Duloxetine Hcl] Nausea Only  . Lipitor [Atorvastatin Calcium] Other (See Comments)    Leg aches  . Milnacipran Hcl Rash  . Savella [Milnacipran Hcl] Rash  . Zocor [Simvastatin] Other (See Comments)    Hip pain    Social History   Socioeconomic History  . Marital status: Married    Spouse name: Not on file  .  Number of children: Not on file  . Years of education: Not on file  . Highest education level: Not on file  Social Needs  . Financial resource strain: Not on file  . Food insecurity - worry: Not on file  . Food insecurity - inability: Not on file  . Transportation needs - medical: Not on file  . Transportation needs - non-medical: Not on file  Occupational History  . Not on file  Tobacco Use  . Smoking status: Never Smoker  . Smokeless tobacco: Never Used  Substance and Sexual Activity  . Alcohol use: No  . Drug use: No  . Sexual activity: Yes    Birth control/protection: Surgical  Other Topics Concern  . Not on file  Social History Narrative   Married    Family History  Problem Relation Age of Onset  . CVA Mother   . Stroke Mother   . Osteoporosis Mother   . Cancer Father        prostate  . Bronchitis Sister   . COPD Sister   . Early death Brother        died in [place]  . Carpal tunnel syndrome Sister   . Cancer Brother        throat  . Cancer Brother        lung  . Cancer Brother        throat  . Stroke Brother   . Heart attack Brother   . Gout Brother   . Heart Problems Brother        stents  . CAD Brother   . Hypertension Brother   . Gout Brother   . Arthritis Brother   . Diabetes Son   . Coronary artery disease Neg Hx        No premature  . Anesthesia problems Neg Hx   . Hypotension Neg Hx   . Malignant hyperthermia Neg Hx   . Pseudochol deficiency Neg Hx     ROS- All systems are reviewed and negative except as per the HPI above  Physical Exam: Vitals:   04/11/17 1029  BP: 132/68  Pulse: 69  Weight: 214 lb (97.1 kg)  Height: 5\' 5"  (1.651 m)   Wt Readings from Last 3 Encounters:  04/11/17 214 lb (97.1 kg)  04/05/17 210 lb (95.3 kg)  02/25/17 209 lb (94.8 kg)    Labs: Lab Results  Component Value Date   NA 137 04/06/2017   K 4.0 04/06/2017   CL 100 (L) 04/06/2017   CO2 30 04/06/2017   GLUCOSE 123 (H) 04/06/2017  BUN 10 04/06/2017     CREATININE 0.64 04/06/2017   CALCIUM 8.9 04/06/2017   PHOS 4.0 10/16/2008   MG 2.0 04/06/2017   Lab Results  Component Value Date   INR 0.93 03/30/2016   Lab Results  Component Value Date   CHOL 209 (H) 02/25/2017   HDL 56 02/25/2017   LDLCALC 126 (H) 02/25/2017   TRIG 133 02/25/2017     GEN- The patient is well appearing, alert and oriented x 3 today.   Head- normocephalic, atraumatic Eyes-  Sclera clear, conjunctiva pink Ears- hearing intact Oropharynx- clear Neck- supple, no JVP Lymph- no cervical lymphadenopathy Lungs- Clear to ausculation bilaterally, normal work of breathing Heart- Regular rate and rhythm, no murmurs, rubs or gallops, PMI not laterally displaced GI- soft, NT, ND, + BS Extremities- no clubbing, cyanosis, or edema MS- no significant deformity or atrophy Skin- no rash or lesion Psych- euthymic mood, full affect Neuro- strength and sensation are intact  EKG-NSR at 69 bpm, pr int 162 ms, qrs int 76 ms, qtc 439 ms Epic records reviewed Echo-Left ventricle: The cavity size was normal. Wall thickness was increased in a pattern of mild LVH. Systolic function was normal. The estimated ejection fraction was in the range of 60% to 65%. Images were inadequate for LV wall motion assessment. Doppler parameters are consistent with abnormal left ventricular relaxation (grade 1 diastolic dysfunction).   Assessment and Plan: 1. Paroxysmal afib  General education re afib and triggers and when she should report to the ER and when she could try to get herself back in rhythm  Will Rx Cardizem 30 mg as needed for breakthrough afib if HR over 100 and BP over 100 sys I don't think her afib burden sounds as if she will need AAD therapy at this point but we did discuss  this would be the next direction to move into if  afib burden increases Sleep study for witnessed apnea spells. Continue xarelto 20 mg qd for chadsvasc score of 4  F/u with Dr. Domenic Polite as  recall afib clinic as needed  Geroge Baseman. Arnetha Silverthorne, [facility] [address] [place], [place] [address] [phone]

## 2017-05-10 NOTE — Telephone Encounter (Signed)
Informed patient of upcoming sleep study and patient understanding was verbalized. Patient understands her sleep study is scheduled for Wednesday May 25 2017. Patient understands her sleep study will be done at [place] lab. Patient understands she will receive a sleep packet in a week or so. Patient understands to call if she does not receive the sleep packet in a timely manner. Patient agrees with treatment and thanked me for call.

## 2017-05-12 ENCOUNTER — Ambulatory Visit: Payer: Medicare Other | Admitting: *Deleted

## 2017-05-25 ENCOUNTER — Encounter: Payer: Self-pay | Admitting: *Deleted

## 2017-05-25 ENCOUNTER — Ambulatory Visit: Payer: Medicare Other | Attending: Nurse Practitioner | Admitting: Cardiology

## 2017-05-25 ENCOUNTER — Ambulatory Visit (INDEPENDENT_AMBULATORY_CARE_PROVIDER_SITE_OTHER): Payer: Medicare Other | Admitting: *Deleted

## 2017-05-25 VITALS — BP 134/60 | HR 67 | Ht 65.0 in | Wt 214.0 lb

## 2017-05-25 DIAGNOSIS — G4733 Obstructive sleep apnea (adult) (pediatric): Secondary | ICD-10-CM | POA: Diagnosis not present

## 2017-05-25 DIAGNOSIS — I491 Atrial premature depolarization: Secondary | ICD-10-CM | POA: Diagnosis not present

## 2017-05-25 DIAGNOSIS — G4736 Sleep related hypoventilation in conditions classified elsewhere: Secondary | ICD-10-CM | POA: Diagnosis not present

## 2017-05-25 DIAGNOSIS — I4891 Unspecified atrial fibrillation: Secondary | ICD-10-CM | POA: Diagnosis not present

## 2017-05-25 DIAGNOSIS — R0683 Snoring: Secondary | ICD-10-CM | POA: Diagnosis not present

## 2017-05-25 DIAGNOSIS — Z0000 Encounter for general adult medical examination without abnormal findings: Secondary | ICD-10-CM

## 2017-05-25 DIAGNOSIS — G4734 Idiopathic sleep related nonobstructive alveolar hypoventilation: Secondary | ICD-10-CM

## 2017-05-25 DIAGNOSIS — Z7901 Long term (current) use of anticoagulants: Secondary | ICD-10-CM | POA: Insufficient documentation

## 2017-05-25 NOTE — Patient Instructions (Signed)
  Jenna [name],  Thank you for taking time to come for your Medicare Wellness Visit. I appreciate your ongoing commitment to your health goals. Please review the following plan we discussed and let me know if I can assist you in the future.   These are the goals we discussed: Goals    . Exercise 3x per week (30 min per time)     Chair exercises 3 to 5 times a week. Refer to handout.         This is a list of the screening recommended for you and due dates:  Health Maintenance  Topic Date Due  . Tetanus Vaccine  08/26/2017*  . Flu Shot  02/26/2019*  . DEXA scan (bone density measurement)  08/05/2017  . Mammogram  10/20/2017  . Pneumonia vaccines  Completed  *Topic was postponed. The date shown is not the original due date.

## 2017-05-25 NOTE — Progress Notes (Signed)
Subjective:   Jenna [name] is a 82 y.o. female who presents for a subsequent Medicare Annual Wellness Visit. Jenna [name] is retired and lives with her husband in a one story home. She has one adult son and one adult grandson. She enjoys knitting and word search puzzles.   Review of Systems    Reports that her health is a little worse than last year due to fibromyalgia pain.   Cardiac Risk Factors include: obesity (BMI >30kg/m2);sedentary lifestyle;dyslipidemia;hypertension;advanced age (>28men, >56 women)   Musculoskeletal: Chronic neck, shoulder, back, and bilateral knee pain. See Dr Wynelle Link for steroid injections in her knees.  Other systems negative today.      Objective:    Today's Vitals   05/25/17 1515 05/25/17 1517  BP: 134/60   Pulse: 67   Weight: 214 lb (97.1 kg)   Height: 5\' 5"  (1.651 m)   PainSc:  5    Body mass index is 35.61 kg/m.  Advanced Directives 05/25/2017 04/05/2017 08/02/2016 05/10/2016 04/20/2016 03/30/2016 02/24/2015  Does Patient Have a Medical Advance Directive? No No No No No No No  Would patient like information on creating a medical advance directive? No - Patient declined No - Patient declined - No - Patient declined - No - patient declined information No - patient declined information  Pre-existing out of facility DNR order (yellow form or pink MOST form) - - - - - - -    Current Medications (verified) Outpatient Encounter Medications as of 05/25/2017  Medication Sig  . cetirizine (ZYRTEC) 5 MG tablet Take 1 tablet (5 mg total) by mouth daily.  . Cholecalciferol (VITAMIN D3) 2000 UNITS TABS Take 1 tablet by mouth daily.    . colchicine 0.6 MG tablet Take 1 tablet (0.6 mg total) by mouth daily as needed (GOUT FLARE UP).  [name] diltiazem (CARDIZEM) 30 MG tablet Take 1 tablet every 4 hours AS NEEDED for AFIB heart rate over 100  . fluticasone (FLONASE) 50 MCG/ACT nasal spray Place 1 spray into both nostrils 2 (two) times daily as needed for allergies  or rhinitis.  . furosemide (LASIX) 40 MG tablet Take 1 tablet (40 mg total) by mouth daily as needed.  . hydrocortisone (PROCTOSOL HC) 2.5 % rectal cream APPLY RECTALLY DAILY AS NEEDED FOR HEMRRHOIDS OR ITCHING.  [name] levothyroxine (SYNTHROID, LEVOTHROID) 75 MCG tablet TAKE 1 TABLET ONCE DAILY.  . metoprolol succinate (TOPROL-XL) 50 MG 24 hr tablet TAKE 1 TABLET DAILY, TAKE WITH OR IMMEDIATELY FOLLOWING A MEAL  . mupirocin ointment (BACTROBAN) 2 % Place 1 application into the nose 2 (two) times daily.  Alveda Reasons 20 MG TABS tablet TAKE 1 TABLET EVERYDAY WITH SUPPER   No facility-administered encounter medications on file as of 05/25/2017.     Allergies (verified) Daypro [oxaprozin]; Diclofenac sodium; Effexor [venlafaxine hydrochloride]; Penicillins; Prozac [fluoxetine hcl]; Sulfa antibiotics; Amitriptyline; Cymbalta [duloxetine hcl]; Lipitor [atorvastatin calcium]; Milnacipran hcl; Savella [milnacipran hcl]; and Zocor [simvastatin]   History: Past Medical History:  Diagnosis Date  . Cataract   . DDD (degenerative disc disease)   . Fibromyalgia   . Herpes zoster   . History of skin cancer   . Hyperlipidemia   . Hypertension   . Hypothyroidism   . OA (osteoarthritis)   . Osteoporosis   . Palpitations   . Pre-diabetes   . PUD (peptic ulcer disease)   . Skin cancer of nose    [name]   Past Surgical History:  Procedure Laterality Date  . ABDOMINAL HYSTERECTOMY  partial  . BREAST BIOPSY     Right  . CATARACT EXTRACTION W/PHACO  10/28/2011   Procedure: CATARACT EXTRACTION PHACO AND INTRAOCULAR LENS PLACEMENT ([facility]);  Surgeon: Tonny Branch, MD;  Location: [facility];  Service: Ophthalmology;  Laterality: Right;  CDE 18.38  . CATARACT EXTRACTION W/PHACO  02/14/2012   Procedure: CATARACT EXTRACTION PHACO AND INTRAOCULAR LENS PLACEMENT ([facility]);  Surgeon: Tonny Branch, MD;  Location: [facility];  Service: Ophthalmology;  Laterality: Left;  CDE 17.20  . CESAREAN SECTION    . COLONOSCOPY N/A 05/31/2013    Procedure: COLONOSCOPY;  Surgeon: Rogene Houston, MD;  Location: [facility];  Service: Endoscopy;  Laterality: N/A;  240  . EYE SURGERY     skin removal   . FRACTURE SURGERY  1952   fractured right arm   Family History  Problem Relation Age of Onset  . CVA Mother   . Stroke Mother   . Osteoporosis Mother   . Cancer Father        prostate  . Bronchitis Sister   . COPD Sister   . Early death Brother        died in [place]  . Carpal tunnel syndrome Sister   . Cancer Brother        throat  . Cancer Brother        lung  . Cancer Brother        throat  . Stroke Brother   . Heart attack Brother   . Gout Brother   . Heart Problems Brother        stents  . CAD Brother   . Hypertension Brother   . Gout Brother   . Arthritis Brother   . Diabetes Son   . Coronary artery disease Neg Hx        No premature  . Anesthesia problems Neg Hx   . Hypotension Neg Hx   . Malignant hyperthermia Neg Hx   . Pseudochol deficiency Neg Hx    Social History   Socioeconomic History  . Marital status: Married    Spouse name: [name]  . Number of children: 1  . Years of education: 6  . Highest education level: 9th grade  Social Needs  . Financial resource strain: Not hard at all  . Food insecurity - worry: Never true  . Food insecurity - inability: Never true  . Transportation needs - medical: No  . Transportation needs - non-medical: No  Occupational History  . Occupation: Retired    Comment: Therapist, art  Tobacco Use  . Smoking status: Never Smoker  . Smokeless tobacco: Never Used  Substance and Sexual Activity  . Alcohol use: No  . Drug use: No  . Sexual activity: Yes    Birth control/protection: Surgical  Other Topics Concern  . Not on file  Social History Narrative   Married   Lives in a one story home    No Tobacco use  Clinical Intake:   Pain : 0-10 Pain Score: 5  Pain Type: Chronic pain Pain Location: Generalized(Fibromyalgia-neck pain,  shoulder pain, lower back, and bilateral knee pain) Pain Descriptors / Indicators: Aching Pain Onset: More than a month ago Pain Frequency: Constant Pain Relieving Factors: Rest, tylenol Effect of Pain on Daily Activities: "It's hard to get much done anymore"  Pain Relieving Factors: Rest, tylenol  BMI - recorded: 35 Nutritional Status: BMI > 30  Obese Diabetes: No  How often do you need to have someone help you  when you read instructions, pamphlets, or other written materials from your doctor or pharmacy?: 1 - Never What is the last grade level you completed in school?: 9th  Interpreter Needed?: No  Information entered by :: Chong Sicilian, RN   Activities of Daily Living In your present state of health, do you have any difficulty performing the following activities: 05/25/2017  Hearing? N  Vision? N  Comment Dr Radford Pax. Due for eye exam  Difficulty concentrating or making decisions? N  Walking or climbing stairs? Y  Comment Problems due bilateral knee pain/weakness. One story home. 2 steps up into back of house with handrails.  Dressing or bathing? N  Doing errands, shopping? N  Preparing Food and eating ? N  Using the Toilet? N  In the past six months, have you accidently leaked urine? N  Do you have problems with loss of bowel control? N  Managing your Medications? N  Comment Organized in a Tour manager your Finances? N  Housekeeping or managing your Housekeeping? N  Some recent data might be hidden     Immunizations and Health Maintenance Immunization History  Administered Date(s) Administered  . Pneumococcal Conjugate-13 02/21/2013  . Pneumococcal Polysaccharide-23 05/24/2006  . Td 09/21/2004   There are no preventive care reminders to display for this patient.  Patient Care Team: Chipper Herb, MD as PCP - General (Family Medicine) Rogene Houston, MD as Consulting Physician (Gastroenterology) Satira Sark, MD as Consulting Physician  (Cardiology) Latanya Maudlin, MD as Consulting Physician (Orthopedic Surgery) Gaynelle Arabian, MD as Consulting Physician (Orthopedic Surgery) Particia Nearing, OD (Optometry)  No hospitalizations, ER visits, or surgeries this past year.      Assessment:   This is a routine wellness examination for [facility].  Hearing/Vision screen No deficits noted during visit. Eye exam with Dr Radford Pax is overdue.   Dietary issues and exercise activities discussed: Current Exercise Habits: The patient does not participate in regular exercise at present(Works in her home and walks a lot when out at the store), Exercise limited by: orthopedic condition(s);neurologic condition(s)(Knee pain and fibromyalgia)  Diet Eats three meals a day. Rarely snacks. Watches sodium intake.   Goals    . Exercise 3x per week (30 min per time)     Chair exercises 3 to 5 times a week. Refer to handout.        Depression Screen PHQ 2/9 Scores 05/25/2017 02/25/2017 12/28/2016 10/14/2016 07/24/2016 05/10/2016 05/05/2016  PHQ - 2 Score 3 1 0 0 4 3 4   PHQ- 9 Score 9 - - - 13 10 14     Fall Risk Fall Risk  05/25/2017 02/25/2017 12/28/2016 10/14/2016 05/10/2016  Falls in the past year? Yes Yes Yes No Yes  Number falls in past yr: 2 or more 2 or more 1 - 1  Injury with Fall? No Yes No - No  Comment - - - - -  Risk for fall due to : History of fall(s) - - - -  Follow up Falls prevention discussed - - - Falls prevention discussed    Is the patient's home free of loose throw rugs in walkways, pet beds, electrical cords, etc?   yes      Grab bars in the bathroom? no      Handrails on the stairs?   yes      Adequate lighting?   yes   Cognitive Function: MMSE - Mini Mental State Exam 05/25/2017 05/10/2016 05/10/2016 02/24/2015 02/24/2015  Orientation to time 5 4 4  5 -  Orientation to Place 4 5 5 5  -  Registration 3 3 3 3 3   Attention/ Calculation 3 4 4 5  -  Recall 2 3 3 3  -  Language- name 2 objects 2 2 2 2  -  Language- repeat 1 1 1 1  -   Language- follow 3 step command 3 3 3 3  -  Language- read & follow direction 1 1 1 1  -  Write a sentence 1 1 1 1 1   Copy design 1 1 - 1 -  Total score 26 28 - 30 -  Score is a little lower than I expected based on our conversation.      Screening Tests Health Maintenance  Topic Date Due  . TETANUS/TDAP  08/26/2017 (Originally 09/22/2014)  . INFLUENZA VACCINE  02/26/2019 (Originally 12/22/2016)  . DEXA SCAN  08/05/2017  . MAMMOGRAM  10/20/2017  . PNA vac Low Risk Adult  Completed    Cancer Screenings: Lung: Low Dose CT Chest recommended if Age 18-80 years, 30 pack-year currently smoking OR have quit w/in 15years. Patient does not qualify. Breast: Up to date on Mammogram? Yes   Up to date of Bone Density/Dexa? Yes Colorectal: Declined    Plan:  Keep f/u with PCP Schedule eye exam Move carefully to avoid falls.  Chair exercises at least 3 times per week. Handout given and explained.  Continue to knit and do puzzles.   I have personally reviewed and noted the following in the patient's chart:   . Medical and social history . Use of alcohol, tobacco or illicit drugs  . Current medications and supplements . Functional ability and status . Nutritional status . Physical activity . Advanced directives . List of other physicians . Hospitalizations, surgeries, and ER visits in previous 12 months . Vitals . Screenings to include cognitive, depression, and falls . Referrals and appointments  In addition, I have reviewed and discussed with patient certain preventive protocols, quality metrics, and best practice recommendations. A written personalized care plan for preventive services as well as general preventive health recommendations were provided to patient.     Chong Sicilian, RN  05/25/2017   I have reviewed and agree with the above AWV documentation.   Trude-Margaret Hassell Done, FNP

## 2017-05-28 NOTE — Procedures (Signed)
   NAME: Jenna [name] DATE OF BIRTH:  1935/11/07 MEDICAL RECORD NUMBER [id number]  LOCATION: [facility]  PHYSICIAN: Eliodoro Gullett  DATE OF STUDY: 05/25/2017  SLEEP STUDY TYPE: Nocturnal Polysomnogram               REFERRING PHYSICIAN: Sherran Needs, NP   CLINICAL INFORMATION Sleep Study Type: NPSG  Indication for sleep study: N/A  Epworth Sleepiness Score: 5  SLEEP STUDY TECHNIQUE As per the AASM Manual for the Scoring of Sleep and Associated Events v2.3 (April 2016) with a hypopnea requiring 4% desaturations.  The channels recorded and monitored were frontal, central and occipital EEG, electrooculogram (EOG), submentalis EMG (chin), nasal and oral airflow, thoracic and abdominal wall motion, anterior tibialis EMG, snore microphone, electrocardiogram, and pulse oximetry.  MEDICATIONS Medications self-administered by patient taken the night of the study : [place] The study was initiated at 10:01:22 PM and ended at 5:01:40 AM.  Sleep onset time was 51.0 minutes and the sleep efficiency was 68.7%. The total sleep time was 288.8 minutes.  Stage REM latency was 121.0 minutes.  The patient spent 3.46% of the night in stage N1 sleep, 71.09% in stage N2 sleep, 6.93% in stage N3 and 18.53% in REM.  Alpha intrusion was absent.  Supine sleep was 17.24%.  RESPIRATORY PARAMETERS The overall apnea/hypopnea index (AHI) was 5.8 per hour. There were 0 total apneas, including 0 obstructive, 0 central and 0 mixed apneas. There were 28 hypopneas and 3 RERAs.  The AHI during Stage REM sleep was 28.0 per hour.  AHI while supine was 0.0 per hour.  The mean oxygen saturation was 87.94%. The minimum SpO2 during sleep was 76.00%.  soft snoring was noted during this study.  CARDIAC DATA The 2 lead EKG demonstrated sinus rhythm. The mean heart rate was 67.07 beats per minute. Other EKG findings include: PACs.  LEG MOVEMENT DATA The total PLMS  were 159 with a resulting PLMS index of 33.04. Associated arousal with leg movement index was 3.3 .  IMPRESSIONS - Mild obstructive sleep apnea occurred during this study (AHI = 5.8/h). - No significant central sleep apnea occurred during this study (CAI = 0.0/h). - Moderate oxygen desaturation was noted during this study (Min O2 = 76.00%). - The patient snored with soft snoring volume. - PACs were noted during this study. - Moderate periodic limb movements of sleep occurred during the study. No significant associated arousals.  DIAGNOSIS - Obstructive Sleep Apnea (327.23 [G47.33 ICD-10]) - Nocturnal Hypoxemia (327.26 [G47.36 ICD-10])  RECOMMENDATIONS - Overall all mild OSA but during REM Sleep there is severe OSA with nocturnal hyoxemia, therefore recommend CPAP titration.  - Avoid alcohol, sedatives and other CNS depressants that may worsen sleep apnea and disrupt normal sleep architecture. - Sleep hygiene should be reviewed to assess factors that may improve sleep quality. - Weight management and regular exercise should be initiated or continued if appropriate.  [place], American Board of Sleep Medicine  ELECTRONICALLY SIGNED ON:  05/28/2017, 8:29 PM [place] PH: [phone]   FX: [phone] [place]

## 2017-06-13 ENCOUNTER — Telehealth: Payer: Self-pay | Admitting: *Deleted

## 2017-06-13 DIAGNOSIS — G4733 Obstructive sleep apnea (adult) (pediatric): Secondary | ICD-10-CM

## 2017-06-13 NOTE — Telephone Encounter (Signed)
Please let sleep lab know they need to try the nasal pillow mask with chin strap

## 2017-06-13 NOTE — Telephone Encounter (Signed)
Informed patient of sleep study results and patient understanding was verbalized. Patient understands her sleep study showed she has sleep apnea and Dr Radford Pax recommends a CPAP titration. Patient states she cannot sleep with anything over her face because she is claustrophobic. Patient states she is willing to test if another mask can be used that does not cover her face.

## 2017-06-13 NOTE — Telephone Encounter (Signed)
-----   Message from [name], MD sent at 05/28/2017  8:45 PM EST ----- Please let patient know that they have sleep apnea and recommend CPAP titration. Please set up titration in the sleep lab.

## 2017-06-14 DIAGNOSIS — M79675 Pain in left toe(s): Secondary | ICD-10-CM | POA: Diagnosis not present

## 2017-06-14 DIAGNOSIS — L03032 Cellulitis of left toe: Secondary | ICD-10-CM | POA: Diagnosis not present

## 2017-06-20 ENCOUNTER — Other Ambulatory Visit: Payer: Self-pay | Admitting: Family Medicine

## 2017-06-28 DIAGNOSIS — L03032 Cellulitis of left toe: Secondary | ICD-10-CM | POA: Diagnosis not present

## 2017-07-07 DIAGNOSIS — Z961 Presence of intraocular lens: Secondary | ICD-10-CM | POA: Diagnosis not present

## 2017-07-14 ENCOUNTER — Ambulatory Visit (INDEPENDENT_AMBULATORY_CARE_PROVIDER_SITE_OTHER): Payer: Medicare Other | Admitting: Family Medicine

## 2017-07-14 ENCOUNTER — Encounter: Payer: Self-pay | Admitting: Family Medicine

## 2017-07-14 ENCOUNTER — Ambulatory Visit (INDEPENDENT_AMBULATORY_CARE_PROVIDER_SITE_OTHER): Payer: Medicare Other

## 2017-07-14 VITALS — BP 131/60 | HR 80 | Temp 97.6°F | Ht 65.0 in | Wt 209.0 lb

## 2017-07-14 DIAGNOSIS — M797 Fibromyalgia: Secondary | ICD-10-CM

## 2017-07-14 DIAGNOSIS — E039 Hypothyroidism, unspecified: Secondary | ICD-10-CM

## 2017-07-14 DIAGNOSIS — G4730 Sleep apnea, unspecified: Secondary | ICD-10-CM | POA: Diagnosis not present

## 2017-07-14 DIAGNOSIS — R609 Edema, unspecified: Secondary | ICD-10-CM

## 2017-07-14 DIAGNOSIS — R202 Paresthesia of skin: Secondary | ICD-10-CM

## 2017-07-14 DIAGNOSIS — M542 Cervicalgia: Secondary | ICD-10-CM

## 2017-07-14 DIAGNOSIS — R531 Weakness: Secondary | ICD-10-CM

## 2017-07-14 DIAGNOSIS — R200 Anesthesia of skin: Secondary | ICD-10-CM

## 2017-07-14 DIAGNOSIS — E559 Vitamin D deficiency, unspecified: Secondary | ICD-10-CM | POA: Diagnosis not present

## 2017-07-14 DIAGNOSIS — I480 Paroxysmal atrial fibrillation: Secondary | ICD-10-CM | POA: Diagnosis not present

## 2017-07-14 DIAGNOSIS — E7800 Pure hypercholesterolemia, unspecified: Secondary | ICD-10-CM

## 2017-07-14 DIAGNOSIS — I10 Essential (primary) hypertension: Secondary | ICD-10-CM | POA: Diagnosis not present

## 2017-07-14 NOTE — Progress Notes (Signed)
Subjective:    Patient ID: Jenna [name], female    DOB: 1935-08-06, 82 y.o.   MRN: [id number]  HPI Pt here for follow up and management of chronic medical problems which includes hypertension, a fib, hypothyroid and hyperlipidemia. She is taking medication regularly.  The patient today complains of weak spells swelling numbness in her fingers and shortness of breath with exertion.  The patient will get lab work today and will be given an FOBT to return.  She is due to get her mammogram but will wait and do this every 2 years instead of yearly.  The patient does have a history of paroxysmal atrial fibrillation.  She is been to the emergency room on several occasions and is followed regularly by cardiology and the nurse practitioner with cardiology.  The last visit with Dr. Domenic Polite was back in September of this past year.  She has hypertension and hyperlipidemia.  Since the visit with the cardiologist in September, did have a visit to the emergency room at [facility].  She was complaining at that time with palpitations.  This seemed to improve after she got to the emergency room.  An EKG done at the time showed 3-1 AV block with atrial flutter.  She is on Xarelto.  She is also been evaluated for sleep apnea and has mild obstructive sleep apnea done and found during the study.  She has been intolerant of statin drugs in the past.  The patient just lost her sister who was a couple of years younger than her and had a history of diabetes COPD and hypertension.  She lived by herself and she was found dead.  The patient denies any chest pain but does have ongoing shortness of breath.  She has not had any recurrent palpitations and has not had to take any rescue diltiazem since her visit to the nurse practitioner.  She does have a lot of gas but denies any nausea vomiting diarrhea blood in the stool or black tarry bowel movements.  She denies any burning but does have frequency with passing her water.  She has a  follow-up visit scheduled with Dr. Domenic Polite the end of March.  The palpitations that she has are worse at nighttime.  She has not taken any rescue Camillo Flaming is a for this.  She also mentions that she was evaluated for sleep apnea and was told that someone would come to her home to discuss an apparatus to use for CPAP but no one has showed up to discuss this with her at this point in time.  The sleep study was done sometime in January.     Patient Active Problem List   Diagnosis Date Noted  . Right knee pain 01/30/2015  . Hypertension   . Precordial pain 02/05/2014  . Palpitations 02/05/2014  . History of shingles 01/11/2014  . Pre-diabetes 11/21/2013  . Obesity (BMI 30.0-34.9) 11/21/2013  . Mixed hypercholesterolemia and hypertriglyceridemia 11/21/2013  . Metabolic syndrome 86/76/7209  . Multiple drug allergies 10/23/2013  . Statin intolerance 10/23/2013  . Osteopenia of the elderly 07/25/2013  . Vitamin D deficiency 06/19/2013  . Unspecified constipation 04/23/2013  . Rectal bleeding 04/23/2013  . Gout 02/21/2013  . Osteoarthritis 02/21/2013  . Fibromyalgia   . Hyperlipidemia 11/14/2008   Outpatient Encounter Medications as of 07/14/2017  Medication Sig  . cetirizine (ZYRTEC) 5 MG tablet Take 1 tablet (5 mg total) by mouth daily.  . Cholecalciferol (VITAMIN D3) 2000 UNITS TABS Take 1 tablet by mouth  daily.    . colchicine 0.6 MG tablet Take 1 tablet (0.6 mg total) by mouth daily as needed (GOUT FLARE UP).  [name] diltiazem (CARDIZEM) 30 MG tablet Take 1 tablet every 4 hours AS NEEDED for AFIB heart rate over 100  . fluticasone (FLONASE) 50 MCG/ACT nasal spray Place 1 spray into both nostrils 2 (two) times daily as needed for allergies or rhinitis.  . furosemide (LASIX) 40 MG tablet Take 1 tablet (40 mg total) by mouth daily as needed.  . hydrocortisone (PROCTOSOL HC) 2.5 % rectal cream APPLY RECTALLY DAILY AS NEEDED FOR HEMRRHOIDS OR ITCHING.  [name] levothyroxine (SYNTHROID, LEVOTHROID) 75 MCG  tablet TAKE 1 TABLET ONCE DAILY.  . metoprolol succinate (TOPROL-XL) 50 MG 24 hr tablet TAKE 1 TABLET DAILY, TAKE WITH OR IMMEDIATELY FOLLOWING A MEAL  . mupirocin ointment (BACTROBAN) 2 % Place 1 application into the nose 2 (two) times daily.  Alveda Reasons 20 MG TABS tablet TAKE 1 TABLET EVERYDAY WITH SUPPER   No facility-administered encounter medications on file as of 07/14/2017.      Review of Systems  Constitutional: Negative.   HENT: Negative.   Eyes: Negative.   Respiratory: Positive for shortness of breath. Stridor: on exertion    Cardiovascular: Positive for leg swelling.  Gastrointestinal: Negative.   Endocrine: Negative.        Some recent "weak spells"   Genitourinary: Negative.   Musculoskeletal: Negative.   Skin: Negative.   Allergic/Immunologic: Negative.   Neurological: Positive for numbness (of finger tips).  Hematological: Negative.   Psychiatric/Behavioral: Negative.        Objective:   Physical Exam  Constitutional: She is oriented to person, place, and time. She appears well-developed and well-nourished.  Patient is pleasant and relaxed and somewhat sad because of the recent loss of her sister who is younger than her from diabetes and hypertension.  HENT:  Head: Normocephalic and atraumatic.  Right Ear: External ear normal.  Left Ear: External ear normal.  Mouth/Throat: Oropharynx is clear and moist. No oropharyngeal exudate.  Nasal congestion bilaterally  Eyes: Conjunctivae and EOM are normal. Pupils are equal, round, and reactive to light. Right eye exhibits no discharge. Left eye exhibits no discharge. No scleral icterus.  Neck: Normal range of motion. Neck supple. No thyromegaly present.  No bruits thyromegaly or anterior cervical adenopathy  Cardiovascular: Normal rate, regular rhythm, normal heart sounds and intact distal pulses.  No murmur heard. The heart is regular at 84/min  Pulmonary/Chest: Effort normal and breath sounds normal. No respiratory  distress. She has no wheezes. She has no rales.  Clear anteriorly and posteriorly  Abdominal: Soft. Bowel sounds are normal. She exhibits no mass. There is no tenderness. There is no rebound and no guarding.  Slight epigastric tenderness without masses organ enlargement or bruits  Musculoskeletal: Normal range of motion. She exhibits no edema.  Tender left superior shoulder with palpation.  Fairly good range of motion.  Lymphadenopathy:    She has no cervical adenopathy.  Neurological: She is alert and oriented to person, place, and time. She has normal reflexes. No cranial nerve deficit.  Reflexes in the upper and lower extremities were equal bilaterally.  Skin: Skin is warm and dry. No rash noted.  Psychiatric: She has a normal mood and affect. Her behavior is normal. Judgment and thought content normal.  Nursing note and vitals reviewed.   BP 131/60 (BP Location: Left Arm)   Pulse 80   Temp 97.6 F (36.4 C) (Oral)  Ht 5' 5"  (1.651 m)   Wt 209 lb (94.8 kg)   BMI 34.78 kg/m        Assessment & Plan:  1. Essential hypertension -The blood pressure is good today and she will continue with current treatment - CBC with Differential/Platelet - BMP8+EGFR - Hepatic function panel  2. Pure hypercholesterolemia -The patient is statin intolerant and will have to continue with as aggressive therapeutic lifestyle changes as possible - CBC with Differential/Platelet - Lipid panel  3. Hypothyroidism, unspecified type -Continue with current thyroid replacement pending results of lab work - CBC with Differential/Platelet  4. Fibromyalgia -Take Tylenol for aches pains and fever - CBC with Differential/Platelet  5. Paroxysmal atrial fibrillation (HCC) -Follow-up with cardiology as planned - CBC with Differential/Platelet  6. Vitamin D deficiency -Continue with vitamin D replacement pending results of lab work - CBC with Differential/Platelet - VITAMIN D 25 Hydroxy (Vit-D  Deficiency, Fractures)  7. General weakness - CBC with Differential/Platelet - Vitamin B12 - Thyroid Panel With TSH  8. Edema, unspecified type - CBC with Differential/Platelet  9. Numbness and tingling in both hands -C-spine films - CBC with Differential/Platelet - Vitamin B12 - Thyroid Panel With TSH  10. Neck pain on left side -C-spine films - DG Cervical Spine Complete; Future  11.  Sleep apnea -Follow-up with people that ordered the study and make sure that patient is contacted for treatment and that CPAP is initiated  Patient Instructions                       Medicare Annual Wellness Visit  [facility] and the medical providers at [facility] strive to bring you the best medical care.  In doing so we not only want to address your current medical conditions and concerns but also to detect new conditions early and prevent illness, disease and health-related problems.    Medicare offers a yearly Wellness Visit which allows our clinical staff to assess your need for preventative services including immunizations, lifestyle education, counseling to decrease risk of preventable diseases and screening for fall risk and other medical concerns.    This visit is provided free of charge (no copay) for all Medicare recipients. The clinical pharmacists at [facility] have begun to conduct these Wellness Visits which will also include a thorough review of all your medications.    As you primary medical provider recommend that you make an appointment for your Annual Wellness Visit if you have not done so already this year.  You may set up this appointment before you leave today or you may call back ([phone]) and schedule an appointment.  Please make sure when you call that you mention that you are scheduling your Annual Wellness Visit with the clinical pharmacist so that the appointment may be made for the proper length of time.     Continue  current medications. Continue good therapeutic lifestyle changes which include good diet and exercise. Fall precautions discussed with patient. If an FOBT was given today- please return it to our front desk. If you are over 27 years old - you may need Prevnar 33 or the adult Pneumonia vaccine.  **Flu shots are available--- please call and schedule a FLU-CLINIC appointment**  After your visit with [place] today you will receive a survey in the mail or online from [employer] regarding your care with [place]. Please take a moment to fill this out. Your feedback is very important to [place]  as you can help [place] better understand your patient needs as well as improve your experience and satisfaction. WE CARE ABOUT YOU!!!   We will contact the people who ordered the sleep apnea evaluation and make sure the follow-up is taken care of so that this can be initiated for you. We will call with the x-ray results as soon as these become available In the meantime use warm wet compresses to the left neck and superior shoulder for 20 minutes 3 or 4 times daily and take Tylenol if needed for aches pains and fever Follow-up with the cardiologist as planned the end of March Continue to drink plenty of fluids and stay well-hydrated Take ranitidine 150 mg over-the-counter 1 twice daily before breakfast and supper for the next 4-6 weeks and then take as needed.  See if this does not help relieve some of your epigastric discomfort  Arrie Senate MD

## 2017-07-14 NOTE — Patient Instructions (Addendum)
Medicare Annual Wellness Visit  [facility] and the medical providers at [facility] strive to bring you the best medical care.  In doing so we not only want to address your current medical conditions and concerns but also to detect new conditions early and prevent illness, disease and health-related problems.    Medicare offers a yearly Wellness Visit which allows our clinical staff to assess your need for preventative services including immunizations, lifestyle education, counseling to decrease risk of preventable diseases and screening for fall risk and other medical concerns.    This visit is provided free of charge (no copay) for all Medicare recipients. The clinical pharmacists at [facility] have begun to conduct these Wellness Visits which will also include a thorough review of all your medications.    As you primary medical provider recommend that you make an appointment for your Annual Wellness Visit if you have not done so already this year.  You may set up this appointment before you leave today or you may call back ([phone]) and schedule an appointment.  Please make sure when you call that you mention that you are scheduling your Annual Wellness Visit with the clinical pharmacist so that the appointment may be made for the proper length of time.     Continue current medications. Continue good therapeutic lifestyle changes which include good diet and exercise. Fall precautions discussed with patient. If an FOBT was given today- please return it to our front desk. If you are over 45 years old - you may need Prevnar 63 or the adult Pneumonia vaccine.  **Flu shots are available--- please call and schedule a FLU-CLINIC appointment**  After your visit with [place] today you will receive a survey in the mail or online from [employer] regarding your care with [place]. Please take a moment to fill this out. Your feedback is very  important to [place] as you can help [place] better understand your patient needs as well as improve your experience and satisfaction. WE CARE ABOUT YOU!!!   We will contact the people who ordered the sleep apnea evaluation and make sure the follow-up is taken care of so that this can be initiated for you. We will call with the x-ray results as soon as these become available In the meantime use warm wet compresses to the left neck and superior shoulder for 20 minutes 3 or 4 times daily and take Tylenol if needed for aches pains and fever Follow-up with the cardiologist as planned the end of March Continue to drink plenty of fluids and stay well-hydrated Take ranitidine 150 mg over-the-counter 1 twice daily before breakfast and supper for the next 4-6 weeks and then take as needed.  See if this does not help relieve some of your epigastric discomfort

## 2017-07-15 ENCOUNTER — Other Ambulatory Visit: Payer: Self-pay | Admitting: *Deleted

## 2017-07-15 ENCOUNTER — Encounter: Payer: Self-pay | Admitting: Family Medicine

## 2017-07-15 DIAGNOSIS — D519 Vitamin B12 deficiency anemia, unspecified: Secondary | ICD-10-CM | POA: Insufficient documentation

## 2017-07-15 DIAGNOSIS — M542 Cervicalgia: Secondary | ICD-10-CM

## 2017-07-15 DIAGNOSIS — E538 Deficiency of other specified B group vitamins: Secondary | ICD-10-CM

## 2017-07-15 LAB — BMP8+EGFR
BUN/Creatinine Ratio: 23 (ref 12–28)
BUN: 15 mg/dL (ref 8–27)
CO2: 25 mmol/L (ref 20–29)
CREATININE: 0.65 mg/dL (ref 0.57–1.00)
Calcium: 9 mg/dL (ref 8.7–10.3)
Chloride: 101 mmol/L (ref 96–106)
GFR calc Af Amer: 98 mL/min/{1.73_m2} (ref >59)
GFR calc non Af Amer: 85 mL/min/{1.73_m2} (ref >59)
GLUCOSE: 101 mg/dL — AB (ref 65–99)
Potassium: 4.3 mmol/L (ref 3.5–5.2)
Sodium: 143 mmol/L (ref 134–144)

## 2017-07-15 LAB — LIPID PANEL
Chol/HDL Ratio: 3.8 ratio (ref 0.0–4.4)
Cholesterol, Total: 192 mg/dL (ref 100–199)
HDL: 51 mg/dL (ref >39)
LDL CALC: 113 mg/dL — AB (ref 0–99)
TRIGLYCERIDES: 138 mg/dL (ref 0–149)
VLDL CHOLESTEROL CAL: 28 mg/dL (ref 5–40)

## 2017-07-15 LAB — CBC WITH DIFFERENTIAL/PLATELET
BASOS ABS: 0 10*3/uL (ref 0.0–0.2)
Basos: 0 %
EOS (ABSOLUTE): 0.1 10*3/uL (ref 0.0–0.4)
EOS: 2 %
HEMATOCRIT: 33.7 % — AB (ref 34.0–46.6)
HEMOGLOBIN: 11.5 g/dL (ref 11.1–15.9)
IMMATURE GRANS (ABS): 0 10*3/uL (ref 0.0–0.1)
Immature Granulocytes: 0 %
LYMPHS: 29 %
Lymphocytes Absolute: 1.4 10*3/uL (ref 0.7–3.1)
MCH: 35.6 pg — ABNORMAL HIGH (ref 26.6–33.0)
MCHC: 34.1 g/dL (ref 31.5–35.7)
MCV: 104 fL — ABNORMAL HIGH (ref 79–97)
MONOCYTES: 8 %
Monocytes Absolute: 0.4 10*3/uL (ref 0.1–0.9)
Neutrophils Absolute: 3 10*3/uL (ref 1.4–7.0)
Neutrophils: 61 %
Platelets: 181 10*3/uL (ref 150–379)
RBC: 3.23 x10E6/uL — AB (ref 3.77–5.28)
RDW: 16.3 % — ABNORMAL HIGH (ref 12.3–15.4)
WBC: 4.9 10*3/uL (ref 3.4–10.8)

## 2017-07-15 LAB — THYROID PANEL WITH TSH
FREE THYROXINE INDEX: 2.8 (ref 1.2–4.9)
T3 Uptake Ratio: 26 % (ref 24–39)
T4, Total: 10.7 ug/dL (ref 4.5–12.0)
TSH: 1.83 u[IU]/mL (ref 0.450–4.500)

## 2017-07-15 LAB — HEPATIC FUNCTION PANEL
ALBUMIN: 4.3 g/dL (ref 3.5–4.8)
ALT: 12 IU/L (ref 0–32)
AST: 17 IU/L (ref 0–40)
Alkaline Phosphatase: 93 IU/L (ref 39–117)
BILIRUBIN TOTAL: 0.7 mg/dL (ref 0.0–1.2)
BILIRUBIN, DIRECT: 0.22 mg/dL (ref 0.00–0.40)
TOTAL PROTEIN: 6.4 g/dL (ref 6.0–8.5)

## 2017-07-15 LAB — VITAMIN D 25 HYDROXY (VIT D DEFICIENCY, FRACTURES): Vit D, 25-Hydroxy: 47.6 ng/mL (ref 30.0–100.0)

## 2017-07-15 LAB — VITAMIN B12: Vitamin B-12: <150 pg/mL — ABNORMAL LOW (ref 232–1245)

## 2017-07-15 MED ORDER — CYANOCOBALAMIN 1000 MCG/ML IJ SOLN
1000.0000 ug | INTRAMUSCULAR | Status: AC
  Administered 2017-07-18 – 2017-08-08 (×4): 1000 ug via INTRAMUSCULAR

## 2017-07-15 NOTE — Telephone Encounter (Signed)
Lab has been notified to fit patient with nasal pillow mask with chin strap

## 2017-07-15 NOTE — Progress Notes (Signed)
cyan 

## 2017-07-16 ENCOUNTER — Other Ambulatory Visit: Payer: Self-pay | Admitting: Family Medicine

## 2017-07-18 ENCOUNTER — Ambulatory Visit: Payer: Medicare Other | Admitting: Family Medicine

## 2017-07-18 ENCOUNTER — Ambulatory Visit (INDEPENDENT_AMBULATORY_CARE_PROVIDER_SITE_OTHER): Payer: Medicare Other | Admitting: *Deleted

## 2017-07-18 DIAGNOSIS — E538 Deficiency of other specified B group vitamins: Secondary | ICD-10-CM

## 2017-07-18 MED ORDER — RIVAROXABAN 20 MG PO TABS: ORAL_TABLET | ORAL | 2 refills | Status: DC

## 2017-07-18 NOTE — Progress Notes (Signed)
Pt given Cyanocobalamin inj Tolerated well 

## 2017-07-22 ENCOUNTER — Ambulatory Visit (HOSPITAL_COMMUNITY)
Admission: RE | Admit: 2017-07-22 | Discharge: 2017-07-22 | Disposition: A | Discharge status: Home / Self Care | Payer: Medicare Other | Source: Ambulatory Visit | Attending: Family Medicine | Admitting: Family Medicine

## 2017-07-22 DIAGNOSIS — M542 Cervicalgia: Secondary | ICD-10-CM | POA: Diagnosis not present

## 2017-07-22 DIAGNOSIS — M50221 Other cervical disc displacement at C4-C5 level: Secondary | ICD-10-CM | POA: Diagnosis not present

## 2017-07-22 DIAGNOSIS — M47812 Spondylosis without myelopathy or radiculopathy, cervical region: Secondary | ICD-10-CM | POA: Insufficient documentation

## 2017-07-25 ENCOUNTER — Ambulatory Visit: Payer: Medicare Other

## 2017-07-25 ENCOUNTER — Ambulatory Visit (INDEPENDENT_AMBULATORY_CARE_PROVIDER_SITE_OTHER): Payer: Medicare Other | Admitting: *Deleted

## 2017-07-25 DIAGNOSIS — E538 Deficiency of other specified B group vitamins: Secondary | ICD-10-CM | POA: Diagnosis not present

## 2017-07-25 NOTE — Progress Notes (Signed)
Pt given cyanocobalamin inj Tolerated well 

## 2017-08-01 ENCOUNTER — Ambulatory Visit (INDEPENDENT_AMBULATORY_CARE_PROVIDER_SITE_OTHER): Payer: Medicare Other | Admitting: *Deleted

## 2017-08-01 DIAGNOSIS — E538 Deficiency of other specified B group vitamins: Secondary | ICD-10-CM | POA: Diagnosis not present

## 2017-08-01 MED ORDER — PREDNISONE 10 MG (21) PO TBPK: ORAL_TABLET | ORAL | 0 refills | Status: DC

## 2017-08-01 NOTE — Progress Notes (Signed)
Per Dr Laurance Flatten, Sterapred dose pack sent to [facility] Pt given Cyanocobalamin inj Tolerated well

## 2017-08-08 ENCOUNTER — Ambulatory Visit (INDEPENDENT_AMBULATORY_CARE_PROVIDER_SITE_OTHER): Payer: Medicare Other | Admitting: *Deleted

## 2017-08-08 ENCOUNTER — Ambulatory Visit: Payer: Medicare Other

## 2017-08-08 DIAGNOSIS — E538 Deficiency of other specified B group vitamins: Secondary | ICD-10-CM | POA: Diagnosis not present

## 2017-08-08 NOTE — Progress Notes (Signed)
Pt given Cyanocobalamin inj Tolerated well 

## 2017-08-17 NOTE — Progress Notes (Signed)
Cardiology Office Note  Date: 08/18/2017   ID: Renatta, Shrieves [birth year], MRN [id number]  PCP: Chipper Herb, MD  Primary Cardiologist: Rozann Lesches, MD   Chief Complaint  Patient presents with  . Atrial Fibrillation    History of Present Illness: CADIENCE BRADFIELD is an 82 y.o. female last seen in September 2018.  She has had interval follow-up in the Atrial Fibrillation clinic - she was prescribed short acting Cardizem 30 mg for episodes of breakthrough atrial fibrillation and elevated heart rate.  She was also referred for a sleep study.  She presents today stating that she has had no further palpitations and has not had to use any of the short acting Cardizem as yet.  She otherwise remains on standing Toprol-XL and Xarelto.  Past Medical History:  Diagnosis Date  . Cataract   . DDD (degenerative disc disease)   . Fibromyalgia   . Herpes zoster   . History of skin cancer   . Hyperlipidemia   . Hypertension   . Hypothyroidism   . OA (osteoarthritis)   . Osteoporosis   . Palpitations   . Pre-diabetes   . PUD (peptic ulcer disease)   . Skin cancer of nose    [name]    Past Surgical History:  Procedure Laterality Date  . ABDOMINAL HYSTERECTOMY     partial  . BREAST BIOPSY     Right  . CATARACT EXTRACTION W/PHACO  10/28/2011   Procedure: CATARACT EXTRACTION PHACO AND INTRAOCULAR LENS PLACEMENT (IOC);  Surgeon: Tonny Branch, MD;  Location: [facility];  Service: Ophthalmology;  Laterality: Right;  CDE 18.38  . CATARACT EXTRACTION W/PHACO  02/14/2012   Procedure: CATARACT EXTRACTION PHACO AND INTRAOCULAR LENS PLACEMENT (IOC);  Surgeon: Tonny Branch, MD;  Location: [facility] OR[facility];  Service: Ophthalmology;  Laterality: Left;  CDE 17.20  . CESAREAN SECTION    . COLONOSCOPY N/A 05/31/2013   Procedure: COLONOSCOPY;  Surgeon: Rogene Houston, MD;  Location: [facility] ENDO SUITE;  Service: Endoscopy;  Laterality: N/A;  240  . EYE SURGERY     skin removal   . FRACTURE SURGERY  1952   fractured right arm    Current Outpatient Medications  Medication Sig Dispense Refill  . Cholecalciferol (VITAMIN D3) 2000 UNITS TABS Take 1 tablet by mouth daily.      . colchicine 0.6 MG tablet Take 1 tablet (0.6 mg total) by mouth daily as needed (GOUT FLARE UP). 30 tablet 0  . diltiazem (CARDIZEM) 30 MG tablet Take 1 tablet every 4 hours AS NEEDED for AFIB heart rate over 100 45 tablet 1  . furosemide (LASIX) 40 MG tablet Take 1 tablet (40 mg total) by mouth daily as needed. 30 tablet 3  . hydrocortisone (PROCTOSOL HC) 2.5 % rectal cream APPLY RECTALLY DAILY AS NEEDED FOR HEMRRHOIDS OR ITCHING. 28.35 g 2  . levothyroxine (SYNTHROID, LEVOTHROID) 75 MCG tablet TAKE 1 TABLET ONCE DAILY. 30 tablet 8  . metoprolol succinate (TOPROL-XL) 50 MG 24 hr tablet TAKE 1 TABLET DAILY, TAKE WITH OR IMMEDIATELY FOLLOWING A MEAL 30 tablet 0  . mupirocin ointment (BACTROBAN) 2 % Place 1 application into the nose 2 (two) times daily. 22 g 0  . rivaroxaban (XARELTO) 20 MG TABS tablet TAKE 1 TABLET EVERYDAY WITH SUPPER. 30 tablet 2   No current facility-administered medications for this visit.    Allergies:  Daypro [oxaprozin]; Diclofenac sodium; Effexor [venlafaxine hydrochloride]; Penicillins; Prozac [fluoxetine hcl]; Sulfa antibiotics; Amitriptyline; Cymbalta [duloxetine hcl]; Lipitor [  atorvastatin calcium]; Milnacipran hcl; Savella [milnacipran hcl]; and Zocor [simvastatin]   Social History: The patient  reports that she has never smoked. She has never used smokeless tobacco. She reports that she does not drink alcohol or use drugs.   ROS:  Please see the history of present illness. Otherwise, complete review of systems is positive for occasional leg swelling for which she uses as needed Lasix.  All other systems are reviewed and negative.   Physical Exam: VS:  BP 115/74   Pulse 67   Ht 5\' 6"  (1.676 m)   Wt 210 lb 6.4 oz (95.4 kg)   SpO2 97%   BMI 33.96 kg/m , BMI Body mass index is 33.96  kg/m.  Wt Readings from Last 3 Encounters:  08/18/17 210 lb 6.4 oz (95.4 kg)  07/14/17 209 lb (94.8 kg)  05/25/17 214 lb (97.1 kg)    General: Patient appears comfortable at rest. HEENT: Conjunctiva and lids normal, oropharynx clear. Neck: Supple, no elevated JVP or carotid bruits, no thyromegaly. Lungs: Clear to auscultation, nonlabored breathing at rest. Cardiac: Regular rate and rhythm, no S3 or significant systolic murmur, no pericardial rub. Abdomen: Soft, nontender, bowel sounds present, no guarding or rebound. Extremities: Mild bilateral ankle edema, distal pulses 2+.  ECG: I personally reviewed the tracing from 04/11/2017 which showed normal sinus rhythm.  Recent Labwork: 04/06/2017: Magnesium 2.0 07/14/2017: ALT 12; AST 17; BUN 15; Creatinine, Ser 0.65; Hemoglobin 11.5; Platelets 181; Potassium 4.3; Sodium 143; TSH 1.830     Component Value Date/Time   CHOL 192 07/14/2017 1256   TRIG 138 07/14/2017 1256   TRIG 161 (H) 10/14/2016 1010   HDL 51 07/14/2017 1256   HDL 54 10/14/2016 1010   CHOLHDL 3.8 07/14/2017 1256   CHOLHDL 3.6 10/16/2008 0540   VLDL 23 10/16/2008 0540   LDLCALC 113 (H) 07/14/2017 1256   LDLCALC 138 (H) 02/26/2014 1001    Other Studies Reviewed Today:  Echocardiogram 08/20/2014: Study Conclusions  - Left ventricle: The cavity size was normal. Wall thickness was increased in a pattern of mild LVH. Systolic function was normal. The estimated ejection fraction was in the range of 60% to 65%. Doppler parameters are consistent with abnormal left ventricular relaxation (grade 1 diastolic dysfunction). - Aortic valve: There was mild regurgitation. Valve area (VTI): 2.38 cm^2. Valve area (Vmax): 2.32 cm^2. - Atrial septum: No defect or patent foramen ovale was identified. - Systemic veins: IVC is dilated with normal respiratory variation, estimated RA pressure is 8 mmHg. - Technically difficult study.  Assessment and Plan:  1.   Paroxysmal atrial fibrillation/flutter.  Chads Vascor is 3.  She continues on Toprol-XL and Xarelto.  She has had no interval palpitations to require short acting Cardizem.  2.  Essential hypertension, blood pressure control is good today.  Current medicines were reviewed with the patient today.  Disposition: Follow-up in 6 months.  Signed, Satira Sark, MD, [facility] 08/18/2017 3:52 PM    [facility] [place], [place], [place] [address] Phone: [phone]; Fax: [phone]

## 2017-08-18 ENCOUNTER — Ambulatory Visit (INDEPENDENT_AMBULATORY_CARE_PROVIDER_SITE_OTHER): Payer: Medicare Other | Admitting: Cardiology

## 2017-08-18 ENCOUNTER — Encounter: Payer: Self-pay | Admitting: Cardiology

## 2017-08-18 VITALS — BP 115/74 | HR 67 | Ht 66.0 in | Wt 210.4 lb

## 2017-08-18 DIAGNOSIS — I480 Paroxysmal atrial fibrillation: Secondary | ICD-10-CM | POA: Diagnosis not present

## 2017-08-18 DIAGNOSIS — I10 Essential (primary) hypertension: Secondary | ICD-10-CM

## 2017-08-18 NOTE — Patient Instructions (Signed)

## 2017-08-26 ENCOUNTER — Telehealth: Payer: Self-pay | Admitting: *Deleted

## 2017-08-26 NOTE — Telephone Encounter (Signed)
Spoke with patient who confirmed she is still willing to have titration order.. Orders in sent to pre cert.

## 2017-08-26 NOTE — Telephone Encounter (Signed)
Informed Jenna [name] according to guidelines patient doesn't need PA for sleep study. She has both Medicare and Medicaid. Okay to schedule.

## 2017-08-29 ENCOUNTER — Encounter: Payer: Self-pay | Admitting: *Deleted

## 2017-08-29 ENCOUNTER — Telehealth: Payer: Self-pay | Admitting: *Deleted

## 2017-08-29 NOTE — Telephone Encounter (Addendum)
Patient is scheduled for lab study on Thursday September 15 2016. Patient understands her sleep study will be done at [facility] lab. Patient understands she will receive a sleep packet in a week or so. Patient understands to call if she does not receive the sleep packet in a timely manner.  Patient notified is agreeable to treatment.[name]

## 2017-08-29 NOTE — Telephone Encounter (Signed)
-----   Message from Lauralee Evener, Nora sent at 08/26/2017  3:21 PM EDT ----- Regarding: RE: pre cert Patient has MCR/MCD  No PA needed. Ok to schedule ----- Message ----- From: Freada Bergeron, CMA Sent: 08/26/2017   2:20 PM To: [facility] Div Sleep Studies Subject: pre cert                                       CPAP titration

## 2017-09-05 NOTE — Telephone Encounter (Signed)
Patient called and left message to reschedule her sleep study I returned her call and left message to call back.

## 2017-09-08 ENCOUNTER — Ambulatory Visit (INDEPENDENT_AMBULATORY_CARE_PROVIDER_SITE_OTHER): Payer: Medicare Other

## 2017-09-08 ENCOUNTER — Ambulatory Visit: Payer: Medicare Other

## 2017-09-08 DIAGNOSIS — D519 Vitamin B12 deficiency anemia, unspecified: Secondary | ICD-10-CM | POA: Diagnosis not present

## 2017-09-08 MED ORDER — CYANOCOBALAMIN 1000 MCG/ML IJ SOLN
1000.0000 ug | INTRAMUSCULAR | Status: DC
  Administered 2017-09-08 – 2018-04-17 (×8): 1000 ug via INTRAMUSCULAR

## 2017-09-14 ENCOUNTER — Encounter (HOSPITAL_BASED_OUTPATIENT_CLINIC_OR_DEPARTMENT_OTHER): Payer: Medicare Other

## 2017-09-15 ENCOUNTER — Ambulatory Visit: Payer: Medicare Other | Attending: Cardiology | Admitting: Cardiology

## 2017-09-15 DIAGNOSIS — G4733 Obstructive sleep apnea (adult) (pediatric): Secondary | ICD-10-CM | POA: Diagnosis not present

## 2017-10-01 NOTE — Procedures (Signed)
   Gender: Female D.O.B: 1936-04-26 Age (years): 71 Referring Provider: Fransico Him MD, ABSM Height (inches): 65 Interpreting Physician: Fransico Him MD, ABSM Weight (lbs): 214 RPSGT: Peak, Robert BMI: 36 MRN: [id number] Neck Size: 17.00  CLINICAL INFORMATION The patient is referred for a CPAP titration to treat sleep apnea.???????  SLEEP STUDY TECHNIQUE As per the AASM Manual for the Scoring of Sleep and Associated Events v2.3 (April 2016) with a hypopnea requiring 4% desaturations.  The channels recorded and monitored were frontal, central and occipital EEG, electrooculogram (EOG), submentalis EMG (chin), nasal and oral airflow, thoracic and abdominal wall motion, anterior tibialis EMG, snore microphone, electrocardiogram, and pulse oximetry. Continuous positive airway pressure (CPAP) was initiated at the beginning of the study and titrated to treat sleep-disordered breathing.  MEDICATIONS Medications self-administered by patient taken the night of the study : [place] Comments added by technician: Patient had difficulty initiating sleep. Comments added by scorer: N/A  RESPIRATORY PARAMETERS Optimal PAP Pressure (cm): 9  AHI at Optimal Pressure (/hr):0.0 Overall Minimal O2 (%):72.0  Supine % at Optimal Pressure (%):0 Minimal O2 at Optimal Pressure (%): 85.0   SLEEP ARCHITECTURE The study was initiated at 11:11:06 PM and ended at 5:19:20 AM.  Sleep onset time was 29.6 minutes and the sleep efficiency was 57.2%%. The total sleep time was 210.6 minutes.  The patient spent 12.2%% of the night in stage N1 sleep, 81.7%% in stage N2 sleep, 1.7%% in stage N3 and 4.51% in REM.Stage REM latency was 163.5 minutes  Wake after sleep onset was 128.0. Alpha intrusion was absent. Supine sleep was 0.47%.  CARDIAC DATA The 2 lead EKG demonstrated sinus rhythm. The mean heart rate was 64.4 beats per minute. Other EKG findings include: None.  LEG MOVEMENT  DATA The total Periodic Limb Movements of Sleep (PLMS) were 0. The PLMS index was 0.0. A PLMS index of <15 is considered normal in adults.  IMPRESSIONS - The optimal PAP pressure was 9 cm of water. - Central sleep apnea was not noted during this titration (CAI = 0.0/h). - Severe oxygen desaturations were observed during this titration (min O2 = 72.0%). - No snoring was audible during this study. - No cardiac abnormalities were observed during this study. - Clinically significant periodic limb movements were not noted during this study. Arousals associated with PLMs were rare.  DIAGNOSIS - Obstructive Sleep Apnea (327.23 [G47.33 ICD-10])  RECOMMENDATIONS - Trial of CPAP therapy on 9 cm H2O with a Medium size Resmed Nasal Pillow Mask AirFit P10 for Her mask and heated humidification. - Avoid alcohol, sedatives and other CNS depressants that may worsen sleep apnea and disrupt normal sleep architecture. - Sleep hygiene should be reviewed to assess factors that may improve sleep quality. - Weight management and regular exercise should be initiated or continued. - Return to Sleep Center for re-evaluation after 10 weeks of therapy   [place], [facility]  ELECTRONICALLY SIGNED ON:  10/01/2017, 6:27 PM [place] PH: [phone]   FX: [phone] [place]

## 2017-10-03 ENCOUNTER — Telehealth: Payer: Self-pay | Admitting: *Deleted

## 2017-10-03 NOTE — Telephone Encounter (Addendum)
Informed patient of titration results and verbalized understanding was indicated. Patient understands she had a successful PAP titration. Patient understands Dr Radford Pax has ordered her a cpap.   Upon patient request her selection is [facility] in [place]. Patient understands she will be contacted by her CA to set up her cpap. Patient understands to call if CA does not contact her with new setup in a timely manner. Patient understands they will be called once confirmation has been received from CA that they have received their new machine to schedule 10 week follow up appointment.  CA will be notified of new cpap order  Please add to airview Patient was grateful for the call and thanked me.

## 2017-10-03 NOTE — Telephone Encounter (Signed)
-----   Message from [name], MD sent at 10/01/2017  6:31 PM EDT ----- Please let patient know that they had a successful PAP titration and let DME know that orders are in EPIC.  Please set up 10 week OV with me.

## 2017-10-10 ENCOUNTER — Ambulatory Visit (INDEPENDENT_AMBULATORY_CARE_PROVIDER_SITE_OTHER): Payer: Medicare Other | Admitting: *Deleted

## 2017-10-10 DIAGNOSIS — D519 Vitamin B12 deficiency anemia, unspecified: Secondary | ICD-10-CM

## 2017-10-10 DIAGNOSIS — E538 Deficiency of other specified B group vitamins: Secondary | ICD-10-CM

## 2017-10-10 NOTE — Progress Notes (Signed)
Pt given Cyanocobalamin inj Tolerated well 

## 2017-10-15 ENCOUNTER — Other Ambulatory Visit: Payer: Self-pay | Admitting: Family Medicine

## 2017-10-28 ENCOUNTER — Other Ambulatory Visit: Payer: Self-pay | Admitting: Family Medicine

## 2017-11-05 IMAGING — CR DG TOE 2ND 2+V*L*
3 series · 3 of 3 positions shown · non-contrast
Comparison: None.

CLINICAL DATA: Left second toe pain and redness. No reported
injury.

EXAM:
LEFT SECOND TOE

[view not recorded (1 of 3)]
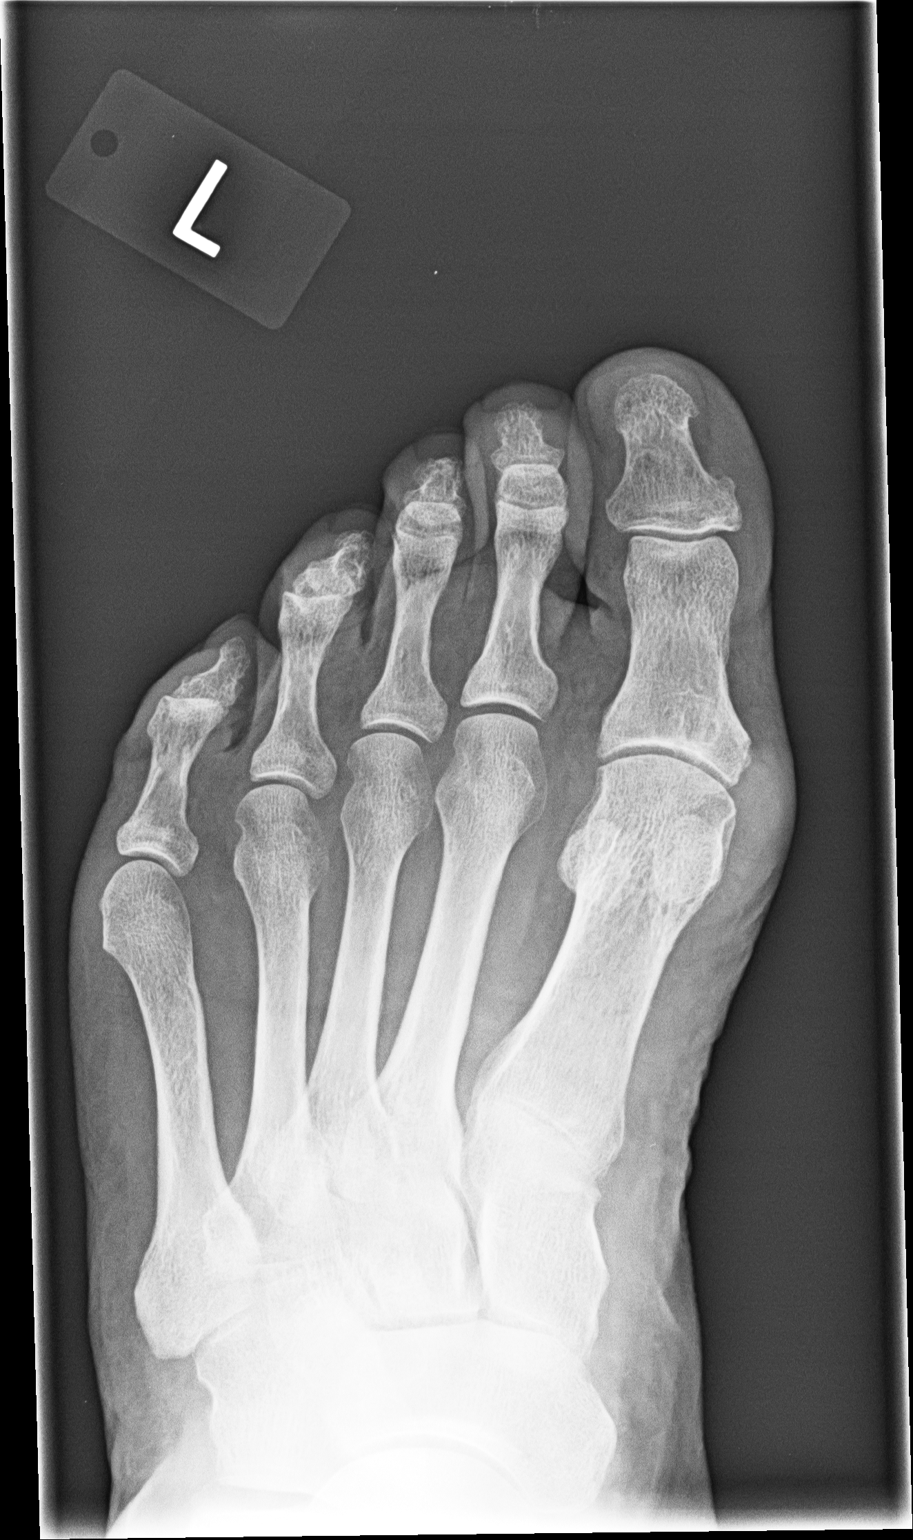

[view not recorded (2 of 3)]
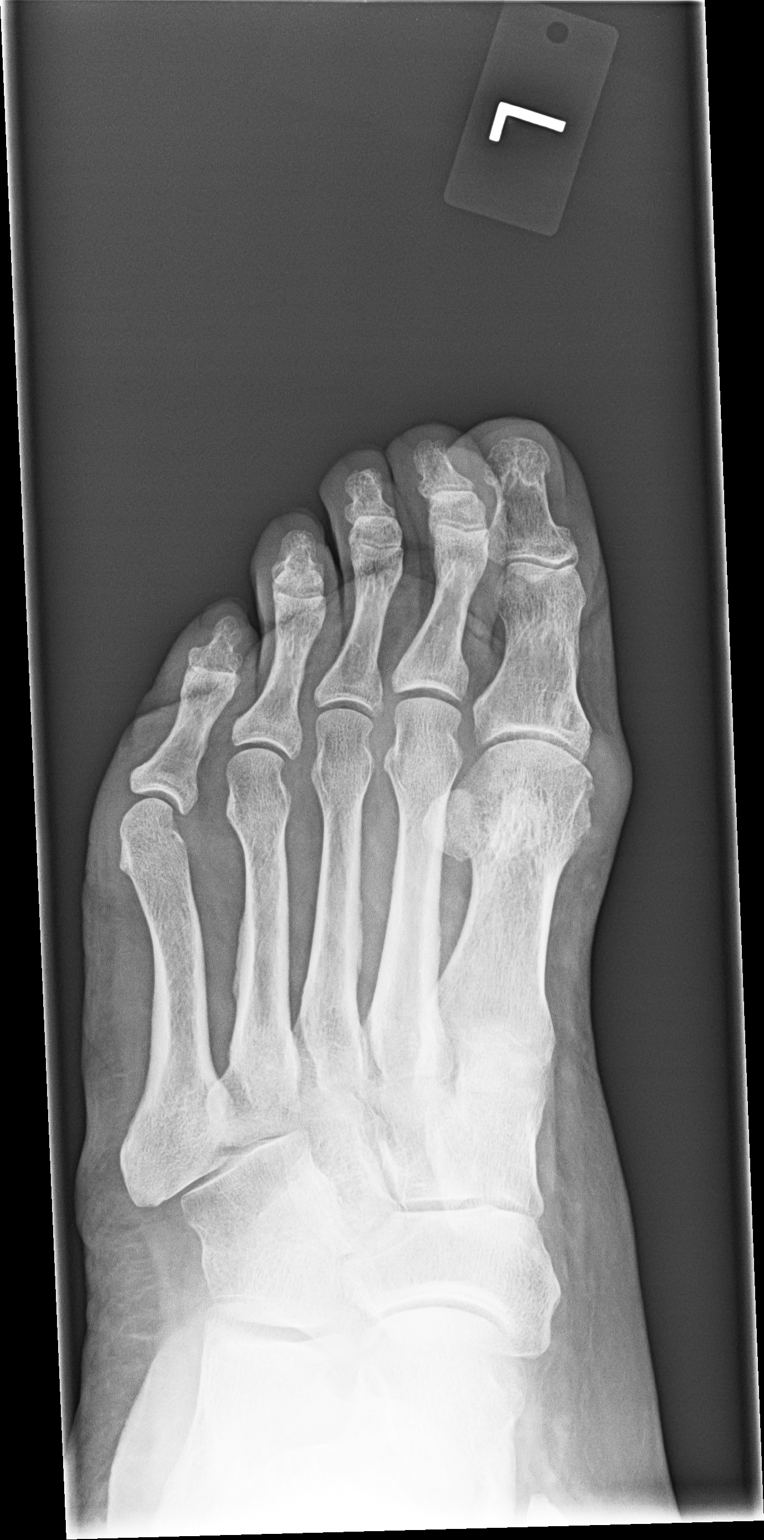

[view not recorded (3 of 3)]
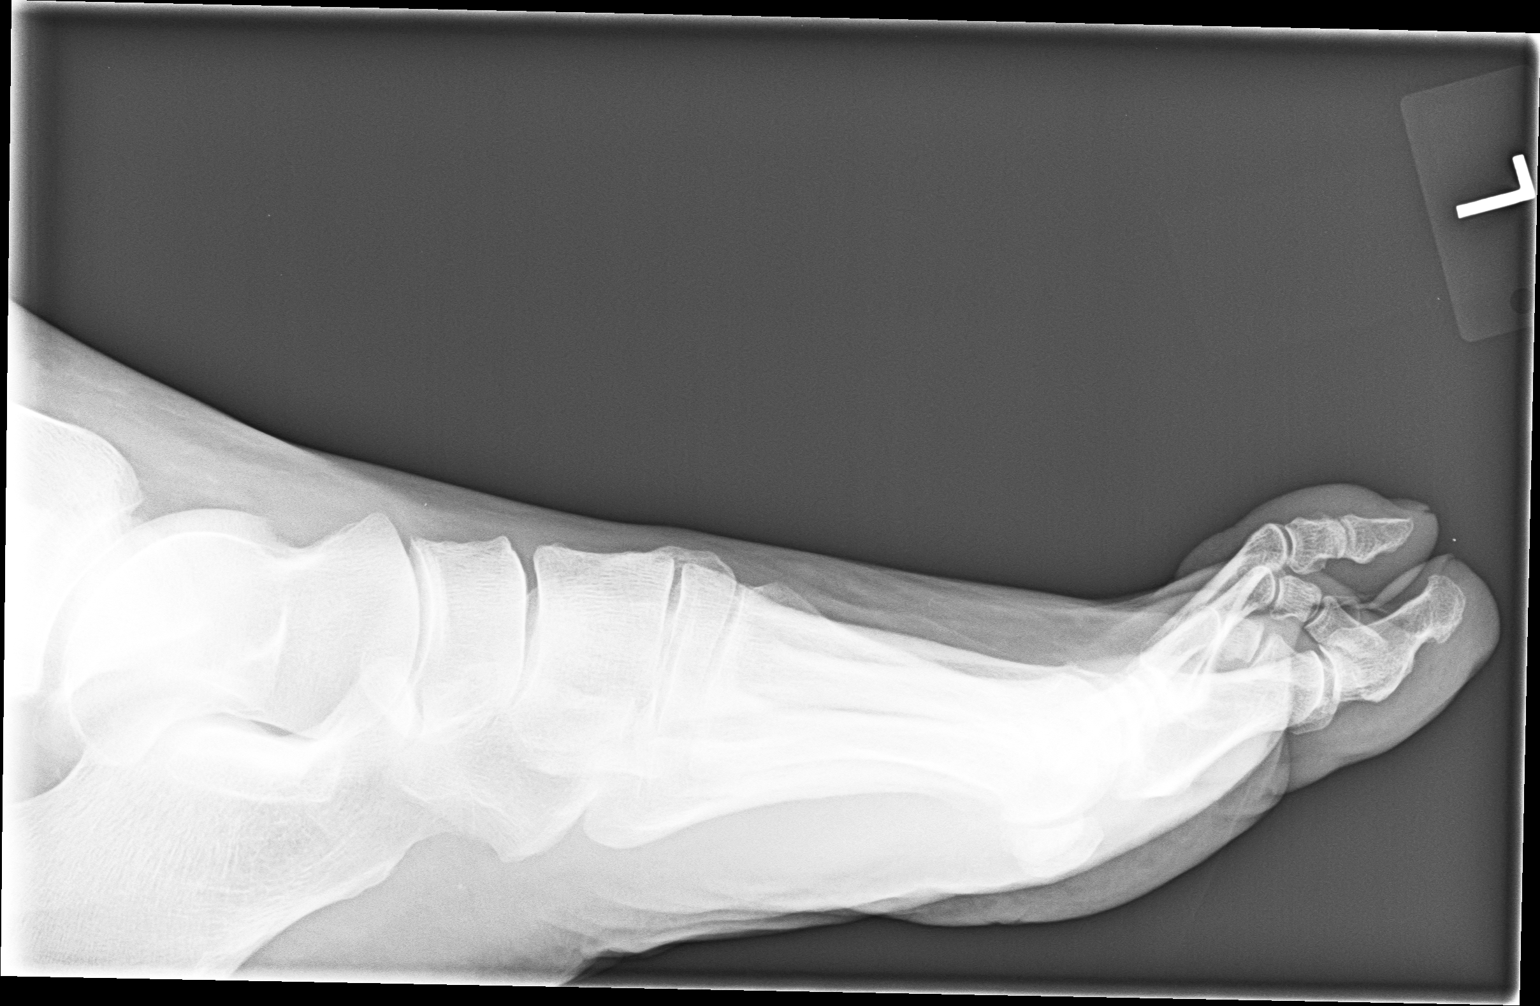

[3 of 3 positions shown; findings below may reference images not displayed]

FINDINGS: There is no evidence of fracture or dislocation. There is no
evidence of arthropathy or other focal bone abnormality. Soft
tissues are unremarkable.
IMPRESSION: Normal left second toe.

## 2017-11-11 ENCOUNTER — Ambulatory Visit (INDEPENDENT_AMBULATORY_CARE_PROVIDER_SITE_OTHER): Payer: Medicare Other

## 2017-11-11 DIAGNOSIS — E538 Deficiency of other specified B group vitamins: Secondary | ICD-10-CM

## 2017-11-11 DIAGNOSIS — D519 Vitamin B12 deficiency anemia, unspecified: Secondary | ICD-10-CM | POA: Diagnosis not present

## 2017-11-16 ENCOUNTER — Ambulatory Visit (INDEPENDENT_AMBULATORY_CARE_PROVIDER_SITE_OTHER): Payer: Medicare Other | Admitting: Family Medicine

## 2017-11-16 ENCOUNTER — Ambulatory Visit (INDEPENDENT_AMBULATORY_CARE_PROVIDER_SITE_OTHER): Payer: Medicare Other

## 2017-11-16 ENCOUNTER — Encounter: Payer: Self-pay | Admitting: Family Medicine

## 2017-11-16 VITALS — BP 156/67 | HR 63 | Temp 97.7°F | Ht 66.0 in | Wt 210.0 lb

## 2017-11-16 DIAGNOSIS — R600 Localized edema: Secondary | ICD-10-CM | POA: Diagnosis not present

## 2017-11-16 DIAGNOSIS — E039 Hypothyroidism, unspecified: Secondary | ICD-10-CM

## 2017-11-16 DIAGNOSIS — E7800 Pure hypercholesterolemia, unspecified: Secondary | ICD-10-CM

## 2017-11-16 DIAGNOSIS — E559 Vitamin D deficiency, unspecified: Secondary | ICD-10-CM | POA: Diagnosis not present

## 2017-11-16 DIAGNOSIS — Z13820 Encounter for screening for osteoporosis: Secondary | ICD-10-CM

## 2017-11-16 DIAGNOSIS — Z780 Asymptomatic menopausal state: Secondary | ICD-10-CM

## 2017-11-16 DIAGNOSIS — E538 Deficiency of other specified B group vitamins: Secondary | ICD-10-CM

## 2017-11-16 DIAGNOSIS — M85852 Other specified disorders of bone density and structure, left thigh: Secondary | ICD-10-CM | POA: Diagnosis not present

## 2017-11-16 DIAGNOSIS — M85851 Other specified disorders of bone density and structure, right thigh: Secondary | ICD-10-CM | POA: Diagnosis not present

## 2017-11-16 DIAGNOSIS — I10 Essential (primary) hypertension: Secondary | ICD-10-CM | POA: Diagnosis not present

## 2017-11-16 NOTE — Progress Notes (Signed)
Subjective:    Patient ID: Jenna [name], female    DOB: 1936/02/24, 82 y.o.   MRN: [id number]  HPI Pt here for follow up and management of chronic medical problems which includes hypertension and hyperlipidemia. She is taking medication regularly.  The patient is doing well overall but does complain with edema today.  She is due to get a DEXA scan and will get this today.  She was given an FOBT to return.  She will have lab work today.  Her blood pressure is elevated this morning but she has not had any blood pressure medication today.  This patient has a history of B12 deficiency fibromyalgia hyperlipidemia hypertension statin intolerance vitamin D deficiency.  She also has obstructive sleep apnea.  Along with paroxysmal atrial fibrillation and she is followed by Dr. Domenic Polite.  The patient is pleasant and doing well.  She is seeing the cardiologist this year already and will not go back again until next year.  She just lost a sister who was 40 years old from a heart attack.  The patient does complain of increasing edema.  She is also had some increased shortness of breath especially with climbing.  She says that she is not eating any more salt than usual.  She denies any chest pain.  She denies any trouble with her stomach other than some bleeding that she has periodically with her hemorrhoids.  She denies any nausea vomiting diarrhea blood in the stool other than that from hemorrhoids or black tarry bowel movements.  She is passing her water without problems.  She does not check her blood pressure regularly.  She is not taking her medicines today for her blood pressure and it is 12 noon.  She understands that in the future she can take medicines with water in the morning so that we can get an accurate reading here.    Patient Active Problem List   Diagnosis Date Noted  . B12 deficiency anemia 07/15/2017  . Right knee pain 01/30/2015  . Hypertension   . Precordial pain 02/05/2014  .  Palpitations 02/05/2014  . History of shingles 01/11/2014  . Pre-diabetes 11/21/2013  . Obesity (BMI 30.0-34.9) 11/21/2013  . Mixed hypercholesterolemia and hypertriglyceridemia 11/21/2013  . Metabolic syndrome 37/62/8315  . Multiple drug allergies 10/23/2013  . Statin intolerance 10/23/2013  . Osteopenia of the elderly 07/25/2013  . Vitamin D deficiency 06/19/2013  . Unspecified constipation 04/23/2013  . Rectal bleeding 04/23/2013  . Gout 02/21/2013  . Osteoarthritis 02/21/2013  . Fibromyalgia   . Hyperlipidemia 11/14/2008   Outpatient Encounter Medications as of 11/16/2017  Medication Sig  . Cholecalciferol (VITAMIN D3) 2000 UNITS TABS Take 1 tablet by mouth daily.    . colchicine 0.6 MG tablet Take 1 tablet (0.6 mg total) by mouth daily as needed (GOUT FLARE UP).  [name] diltiazem (CARDIZEM) 30 MG tablet Take 1 tablet every 4 hours AS NEEDED for AFIB heart rate over 100  . furosemide (LASIX) 40 MG tablet Take 1 tablet (40 mg total) by mouth daily as needed.  . hydrocortisone (ANUSOL-HC) 2.5 % rectal cream APPLY RECTALLY DAILY AS NEEDED FOR HEMMORHOIDS OR ITCHING.  [name] levothyroxine (SYNTHROID, LEVOTHROID) 75 MCG tablet TAKE 1 TABLET ONCE DAILY.  . metoprolol succinate (TOPROL-XL) 50 MG 24 hr tablet TAKE 1 TABLET DAILY, TAKE WITH OR IMMEDIATELY FOLLOWING A MEAL  . mupirocin ointment (BACTROBAN) 2 % Place 1 application into the nose 2 (two) times daily.  Alveda Reasons 20 MG TABS tablet TAKE  1 TABLET EVERYDAY WITH SUPPER.   Facility-Administered Encounter Medications as of 11/16/2017  Medication  . cyanocobalamin ((VITAMIN B-12)) injection 1,000 mcg     Review of Systems  Constitutional: Negative.   HENT: Negative.   Eyes: Negative.   Respiratory: Negative.   Cardiovascular: Positive for leg swelling.  Gastrointestinal: Negative.   Endocrine: Negative.   Genitourinary: Negative.   Musculoskeletal: Negative.   Skin: Negative.   Allergic/Immunologic: Negative.   Neurological:  Negative.   Hematological: Negative.   Psychiatric/Behavioral: Negative.        Objective:   Physical Exam  Constitutional: She is oriented to person, place, and time. She appears well-developed and well-nourished. No distress.  The patient is pleasant and alert and in good spirits despite the recent death of her sister.  HENT:  Head: Normocephalic and atraumatic.  Right Ear: External ear normal.  Left Ear: External ear normal.  Nose: Nose normal.  Mouth/Throat: Oropharynx is clear and moist. No oropharyngeal exudate.  Eyes: Pupils are equal, round, and reactive to light. Conjunctivae and EOM are normal. Right eye exhibits no discharge. Left eye exhibits no discharge. No scleral icterus.  Neck: Normal range of motion. Neck supple. No thyromegaly present.  Cardiovascular: Normal rate, regular rhythm, normal heart sounds and intact distal pulses.  No murmur heard. Heart is regular at 72/min  Pulmonary/Chest: Effort normal and breath sounds normal. She has no wheezes. She has no rales.  Clear anteriorly and posteriorly  Abdominal: Soft. Bowel sounds are normal. She exhibits no mass. There is tenderness. There is no guarding.  Abdomen is obese and slightly tender in the epigastric area.  There is no liver or spleen enlargement no masses and no inguinal adenopathy.  Musculoskeletal: Normal range of motion. She exhibits edema.  1+ bilateral pretibial edema  Lymphadenopathy:    She has no cervical adenopathy.  Neurological: She is alert and oriented to person, place, and time. She has normal reflexes. No cranial nerve deficit.  Skin: Skin is warm and dry. No rash noted.  Multiple seborrheic keratoses on back chest wall and abdomen.  Psychiatric: She has a normal mood and affect. Her behavior is normal. Judgment and thought content normal.  Patient is pleasant and relaxed and a good historian.  Nursing note and vitals reviewed.  BP (!) 156/67 (BP Location: Left Arm)   Pulse 63   Temp  97.7 F (36.5 C) (Oral)   Ht _0  (1.676 m)   Wt 210 lb (95.3 kg)   BMI 33.89 kg/m   It is important to note that the patient did not take her blood pressure medicine this morning and this could account for the elevated blood pressure.  In the future she will take her medicine with water if she has a later appointment in the morning like she had today.      Assessment & Plan:  1. B12 deficiency -Continue with B12 injections - CBC with Differential/Platelet  2. Essential hypertension -Blood pressure is elevated today and patient has not taking her medicine yet.  She does have edema and we will ask her to take an extra fluid pill start wearing support hose and after doing 1 twice daily for 3 days she will do one in the morning and a half in the afternoon and will start wearing her support hose.  She will continue to watch her sodium intake.  She will come by in 1-1/2 to 2 weeks and have her blood pressure rechecked get a weight and we will  consider reducing the furosemide at that time after she started wearing support hose.  She will also need a BMP at that time. - BMP8+EGFR - CBC with Differential/Platelet - Hepatic function panel  3. Pure hypercholesterolemia -Continue with aggressive therapeutic lifestyle changes as she has been intolerant to simvastatin or other statins. - CBC with Differential/Platelet - Lipid panel  4. Hypothyroidism, unspecified type -Continue with current treatment pending results of lab work - CBC with Differential/Platelet - Thyroid Panel With TSH  5. Vitamin D deficiency -Continue with vitamin D replacement pending results of lab work - CBC with Differential/Platelet - VITAMIN D 25 Hydroxy (Vit-D Deficiency, Fractures) - DG [facility] DEXA; Future  6. Screening for osteoporosis - DG [facility] DEXA; Future  7. Postmenopausal - DG [facility] DEXA; Future  8. Edema leg -Increase Lasix to 40 twice daily for 3 days and then 40 in the morning and 20 in the afternoon  until she comes back to the office to recheck weight blood pressure and get BMP in 1-1/2 to 2 weeks.  Patient Instructions                       Medicare Annual Wellness Visit  [facility] and the medical providers at [facility] strive to bring you the best medical care.  In doing so we not only want to address your current medical conditions and concerns but also to detect new conditions early and prevent illness, disease and health-related problems.    Medicare offers a yearly Wellness Visit which allows our clinical staff to assess your need for preventative services including immunizations, lifestyle education, counseling to decrease risk of preventable diseases and screening for fall risk and other medical concerns.    This visit is provided free of charge (no copay) for all Medicare recipients. The clinical pharmacists at [facility] have begun to conduct these Wellness Visits which will also include a thorough review of all your medications.    As you primary medical provider recommend that you make an appointment for your Annual Wellness Visit if you have not done so already this year.  You may set up this appointment before you leave today or you may call back [phone]) and schedule an appointment.  Please make sure when you call that you mention that you are scheduling your Annual Wellness Visit with the clinical pharmacist so that the appointment may be made for the proper length of time.     Continue current medications. Continue good therapeutic lifestyle changes which include good diet and exercise. Fall precautions discussed with patient. If an FOBT was given today- please return it to our front desk. If you are over 27 years old - you may need Prevnar 44 or the adult Pneumonia vaccine.  **Flu shots are available--- please call and schedule a FLU-CLINIC appointment**  After your visit with [place] today you will receive a survey in the  mail or online from [employer] regarding your care with [place]. Please take a moment to fill this out. Your feedback is very important to [place] as you can help [place] better understand your patient needs as well as improve your experience and satisfaction. WE CARE ABOUT YOU!!!  Check blood pressure readings morning and night on a more regular basis and bring these readings by for review and a couple of weeks. Take 40 mg of Lasix twice daily in the morning and at 4:00 in the afternoon for the next 3 days.  After 3 days take 40 in the morning and 20 in the afternoon late and come by the office in the next couple weeks for a blood pressure by the nurse and a BMP and bring readings from home at that time. After taking the furosemide twice daily for 3 days try again to put your support hose on the first thing with getting up in the morning Elevate the feet during the day for 20 minutes at a time higher than the level of the heart and this will help take care of some of the fluid retention.  Arrie Senate MD

## 2017-11-16 NOTE — Patient Instructions (Addendum)
Medicare Annual Wellness Visit  [facility] and the medical providers at [facility] strive to bring you the best medical care.  In doing so we not only want to address your current medical conditions and concerns but also to detect new conditions early and prevent illness, disease and health-related problems.    Medicare offers a yearly Wellness Visit which allows our clinical staff to assess your need for preventative services including immunizations, lifestyle education, counseling to decrease risk of preventable diseases and screening for fall risk and other medical concerns.    This visit is provided free of charge (no copay) for all Medicare recipients. The clinical pharmacists at [facility] have begun to conduct these Wellness Visits which will also include a thorough review of all your medications.    As you primary medical provider recommend that you make an appointment for your Annual Wellness Visit if you have not done so already this year.  You may set up this appointment before you leave today or you may call back ([phone]) and schedule an appointment.  Please make sure when you call that you mention that you are scheduling your Annual Wellness Visit with the clinical pharmacist so that the appointment may be made for the proper length of time.     Continue current medications. Continue good therapeutic lifestyle changes which include good diet and exercise. Fall precautions discussed with patient. If an FOBT was given today- please return it to our front desk. If you are over 63 years old - you may need Prevnar 77 or the adult Pneumonia vaccine.  **Flu shots are available--- please call and schedule a FLU-CLINIC appointment**  After your visit with [place] today you will receive a survey in the mail or online from [employer] regarding your care with [place]. Please take a moment to fill this out. Your feedback is very  important to [place] as you can help [place] better understand your patient needs as well as improve your experience and satisfaction. WE CARE ABOUT YOU!!!  Check blood pressure readings morning and night on a more regular basis and bring these readings by for review and a couple of weeks. Take 40 mg of Lasix twice daily in the morning and at 4:00 in the afternoon for the next 3 days.  After 3 days take 40 in the morning and 20 in the afternoon late and come by the office in the next couple weeks for a blood pressure by the nurse and a BMP and bring readings from home at that time. After taking the furosemide twice daily for 3 days try again to put your support hose on the first thing with getting up in the morning Elevate the feet during the day for 20 minutes at a time higher than the level of the heart and this will help take care of some of the fluid retention.

## 2017-11-17 ENCOUNTER — Other Ambulatory Visit: Payer: Self-pay | Admitting: *Deleted

## 2017-11-17 DIAGNOSIS — D582 Other hemoglobinopathies: Secondary | ICD-10-CM

## 2017-11-17 LAB — BMP8+EGFR
BUN/Creatinine Ratio: 14 (ref 12–28)
BUN: 9 mg/dL (ref 8–27)
CALCIUM: 8.9 mg/dL (ref 8.7–10.3)
CO2: 26 mmol/L (ref 20–29)
CREATININE: 0.66 mg/dL (ref 0.57–1.00)
Chloride: 103 mmol/L (ref 96–106)
GFR calc Af Amer: 96 mL/min/{1.73_m2} (ref >59)
GFR, EST NON AFRICAN AMERICAN: 84 mL/min/{1.73_m2} (ref >59)
Glucose: 102 mg/dL — ABNORMAL HIGH (ref 65–99)
Potassium: 4.3 mmol/L (ref 3.5–5.2)
Sodium: 142 mmol/L (ref 134–144)

## 2017-11-17 LAB — CBC WITH DIFFERENTIAL/PLATELET
BASOS: 1 %
Basophils Absolute: 0 10*3/uL (ref 0.0–0.2)
EOS (ABSOLUTE): 0.1 10*3/uL (ref 0.0–0.4)
EOS: 3 %
HEMATOCRIT: 30.2 % — AB (ref 34.0–46.6)
Hemoglobin: 9.5 g/dL — ABNORMAL LOW (ref 11.1–15.9)
IMMATURE GRANS (ABS): 0 10*3/uL (ref 0.0–0.1)
Immature Granulocytes: 1 %
LYMPHS: 23 %
Lymphocytes Absolute: 1 10*3/uL (ref 0.7–3.1)
MCH: 26.5 pg — ABNORMAL LOW (ref 26.6–33.0)
MCHC: 31.5 g/dL (ref 31.5–35.7)
MCV: 84 fL (ref 79–97)
MONOCYTES: 10 %
Monocytes Absolute: 0.4 10*3/uL (ref 0.1–0.9)
NEUTROS PCT: 62 %
Neutrophils Absolute: 2.8 10*3/uL (ref 1.4–7.0)
PLATELETS: 182 10*3/uL (ref 150–450)
RBC: 3.58 x10E6/uL — AB (ref 3.77–5.28)
RDW: 13.1 % (ref 12.3–15.4)
WBC: 4.3 10*3/uL (ref 3.4–10.8)

## 2017-11-17 LAB — HEPATIC FUNCTION PANEL
ALT: 10 IU/L (ref 0–32)
AST: 17 IU/L (ref 0–40)
Albumin: 3.8 g/dL (ref 3.5–4.7)
Alkaline Phosphatase: 91 IU/L (ref 39–117)
Bilirubin Total: 0.5 mg/dL (ref 0.0–1.2)
Bilirubin, Direct: 0.15 mg/dL (ref 0.00–0.40)
Total Protein: 5.7 g/dL — ABNORMAL LOW (ref 6.0–8.5)

## 2017-11-17 LAB — THYROID PANEL WITH TSH
FREE THYROXINE INDEX: 2.6 (ref 1.2–4.9)
T3 UPTAKE RATIO: 27 % (ref 24–39)
T4, Total: 9.5 ug/dL (ref 4.5–12.0)
TSH: 2.44 u[IU]/mL (ref 0.450–4.500)

## 2017-11-17 LAB — LIPID PANEL
CHOL/HDL RATIO: 4 ratio (ref 0.0–4.4)
Cholesterol, Total: 190 mg/dL (ref 100–199)
HDL: 48 mg/dL (ref >39)
LDL Calculated: 111 mg/dL — ABNORMAL HIGH (ref 0–99)
TRIGLYCERIDES: 155 mg/dL — AB (ref 0–149)
VLDL CHOLESTEROL CAL: 31 mg/dL (ref 5–40)

## 2017-11-17 LAB — VITAMIN D 25 HYDROXY (VIT D DEFICIENCY, FRACTURES): VIT D 25 HYDROXY: 44.7 ng/mL (ref 30.0–100.0)

## 2017-11-25 NOTE — Progress Notes (Signed)
Dr. Tawanna Sat notes appreciated. His last colonoscopy was in 2015. Appears she may be losing blood because of NSAID use. To have a follow-up CBC. Please let me know if I need to see her.

## 2017-12-12 NOTE — Telephone Encounter (Signed)
Patient has a 10 week follow up appointment scheduled for 8/26/ 2019. Patient understands she needs to keep this appointment for insurance compliance. Patient was grateful for the call and thanked me.

## 2017-12-13 ENCOUNTER — Ambulatory Visit (INDEPENDENT_AMBULATORY_CARE_PROVIDER_SITE_OTHER): Payer: Medicare Other | Admitting: *Deleted

## 2017-12-13 DIAGNOSIS — D519 Vitamin B12 deficiency anemia, unspecified: Secondary | ICD-10-CM

## 2017-12-13 DIAGNOSIS — E538 Deficiency of other specified B group vitamins: Secondary | ICD-10-CM

## 2017-12-13 NOTE — Progress Notes (Signed)
Pt given Cyanocobalamin inj Tolerated well 

## 2018-01-13 ENCOUNTER — Ambulatory Visit (INDEPENDENT_AMBULATORY_CARE_PROVIDER_SITE_OTHER): Payer: Medicare Other | Admitting: *Deleted

## 2018-01-13 ENCOUNTER — Other Ambulatory Visit: Payer: Self-pay | Admitting: Family Medicine

## 2018-01-13 DIAGNOSIS — D519 Vitamin B12 deficiency anemia, unspecified: Secondary | ICD-10-CM

## 2018-01-13 DIAGNOSIS — E538 Deficiency of other specified B group vitamins: Secondary | ICD-10-CM

## 2018-01-13 NOTE — Progress Notes (Signed)
Pt given cyanocobalamin inj Tolerated well 

## 2018-01-16 ENCOUNTER — Ambulatory Visit (INDEPENDENT_AMBULATORY_CARE_PROVIDER_SITE_OTHER): Payer: Medicare Other | Admitting: Cardiology

## 2018-01-16 ENCOUNTER — Encounter: Payer: Self-pay | Admitting: Cardiology

## 2018-01-16 ENCOUNTER — Telehealth: Payer: Self-pay | Admitting: *Deleted

## 2018-01-16 VITALS — BP 120/70 | HR 83 | Ht 66.0 in | Wt 210.0 lb

## 2018-01-16 DIAGNOSIS — E669 Obesity, unspecified: Secondary | ICD-10-CM | POA: Diagnosis not present

## 2018-01-16 DIAGNOSIS — I10 Essential (primary) hypertension: Secondary | ICD-10-CM

## 2018-01-16 DIAGNOSIS — G4733 Obstructive sleep apnea (adult) (pediatric): Secondary | ICD-10-CM | POA: Diagnosis not present

## 2018-01-16 DIAGNOSIS — Z9989 Dependence on other enabling machines and devices: Secondary | ICD-10-CM

## 2018-01-16 HISTORY — DX: Obstructive sleep apnea (adult) (pediatric): G47.33

## 2018-01-16 NOTE — Progress Notes (Addendum)
Cardiology Office Note:    Date:  01/16/2018   ID:  Jenna, [name] 09/02/1935, MRN [id number]  PCP:  Chipper Herb, MD  Cardiologist:  No primary care provider on file.    Referring MD: Chipper Herb, MD   Chief Complaint  Patient presents with  . Sleep Apnea  . Hypertension    History of Present Illness:    Jenna [name] is a 82 y.o. female with a hx of hyperlipidemia, hypertension and palpitations who was referred for sleep study by [name] due to obstructive sleep apnea.  Epworth sleepiness score was 5.  She was found to have mild obstructive sleep apnea overall with an AHI of 5.8/h but during REM sleep she had moderate obstructive sleep apnea with an AHI of 28/h.  Oxygen saturations dropped to 76% during respiratory events.  She underwent CPAP titration to 9 cm H2O and is now here for evaluation.  She is doing well with her CPAP device and thinks that she has gotten used to it.  She tolerates the mask and feels the pressure is adequate.  Since going on CPAP she feels rested in the am and has no significant daytime sleepiness.  She denies any significant mouth or nasal dryness or nasal congestion.  She does not think that he snores.     Past Medical History:  Diagnosis Date  . Cataract   . DDD (degenerative disc disease)   . Fibromyalgia   . Herpes zoster   . History of skin cancer   . Hyperlipidemia   . Hypertension   . Hypothyroidism   . OA (osteoarthritis)   . OSA on CPAP 01/16/2018   Mild OSA overall with an AHI of 5.8/h but during REM sleep moderate with an AHI of 28/h.  Oxygen saturations dropped to 76% during respiratory events.  Now on CPAP at 9 cm H2O   . Osteoporosis   . Palpitations   . Pre-diabetes   . PUD (peptic ulcer disease)   . Skin cancer of nose    Jenna [name]    Past Surgical History:  Procedure Laterality Date  . ABDOMINAL HYSTERECTOMY     partial  . BREAST BIOPSY     Right  . CATARACT EXTRACTION W/PHACO  10/28/2011   Procedure: CATARACT EXTRACTION PHACO AND INTRAOCULAR LENS PLACEMENT (IOC);  Surgeon: Tonny Branch, MD;  Location: [facility];  Service: Ophthalmology;  Laterality: Right;  CDE 18.38  . CATARACT EXTRACTION W/PHACO  02/14/2012   Procedure: CATARACT EXTRACTION PHACO AND INTRAOCULAR LENS PLACEMENT (IOC);  Surgeon: Tonny Branch, MD;  Location: [facility];  Service: Ophthalmology;  Laterality: Left;  CDE 17.20  . CESAREAN SECTION    . COLONOSCOPY N/A 05/31/2013   Procedure: COLONOSCOPY;  Surgeon: Rogene Houston, MD;  Location: [facility];  Service: Endoscopy;  Laterality: N/A;  240  . EYE SURGERY     skin removal   . FRACTURE SURGERY  1952   fractured right arm    Current Medications: Current Meds  Medication Sig  . Cholecalciferol (VITAMIN D3) 2000 UNITS TABS Take 1 tablet by mouth daily.    . colchicine 0.6 MG tablet Take 1 tablet (0.6 mg total) by mouth daily as needed (GOUT FLARE UP).  [name] diltiazem (CARDIZEM) 30 MG tablet Take 1 tablet every 4 hours AS NEEDED for AFIB heart rate over 100  . furosemide (LASIX) 40 MG tablet Take 1 tablet (40 mg total) by mouth daily as needed.  . hydrocortisone (ANUSOL-HC)  2.5 % rectal cream APPLY RECTALLY DAILY AS NEEDED FOR HEMMORHOIDS OR ITCHING.  [name] levothyroxine (SYNTHROID, LEVOTHROID) 75 MCG tablet TAKE 1 TABLET ONCE DAILY.  . metoprolol succinate (TOPROL-XL) 50 MG 24 hr tablet TAKE 1 TABLET DAILY, TAKE WITH OR IMMEDIATELY FOLLOWING A MEAL  . mupirocin ointment (BACTROBAN) 2 % Place 1 application into the nose 2 (two) times daily.  Alveda Reasons 20 MG TABS tablet TAKE 1 TABLET EVERYDAY WITH SUPPER.   Current Facility-Administered Medications for the 01/16/18 encounter (Office Visit) with [name], MD  Medication  . cyanocobalamin ((VITAMIN B-12)) injection 1,000 mcg     Allergies:   Daypro [oxaprozin]; Diclofenac sodium; Effexor [venlafaxine hydrochloride]; Penicillins; Prozac [fluoxetine hcl]; Sulfa antibiotics; Amitriptyline; Cymbalta [duloxetine hcl]; Lipitor  [atorvastatin calcium]; Milnacipran hcl; Savella [milnacipran hcl]; and Zocor [simvastatin]   Social History   Socioeconomic History  . Marital status: Married    Spouse name: [name]  . Number of children: 1  . Years of education: 76  . Highest education level: 9th grade  Occupational History  . Occupation: Retired    Comment: Therapist, art  Social Needs  . Financial resource strain: Not hard at all  . Food insecurity:    Worry: Never true    Inability: Never true  . Transportation needs:    Medical: No    Non-medical: No  Tobacco Use  . Smoking status: Never Smoker  . Smokeless tobacco: Never Used  Substance and Sexual Activity  . Alcohol use: No  . Drug use: No  . Sexual activity: Yes    Birth control/protection: Surgical  Lifestyle  . Physical activity:    Days per week: 0 days    Minutes per session: 0 min  . Stress: Only a little  Relationships  . Social connections:    Talks on phone: More than three times a week    Gets together: More than three times a week    Attends religious service: More than 4 times per year    Active member of club or organization: Yes    Attends meetings of clubs or organizations: More than 4 times per year    Relationship status: Married  Other Topics Concern  . Not on file  Social History Narrative   Married   Lives in a one story home      Family History: The patient's family history includes Arthritis in her brother; Bronchitis in her sister; CAD in her brother; COPD in her sister; CVA in her mother; Cancer in her brother, brother, brother, and father; Carpal tunnel syndrome in her sister; Diabetes in her son; Early death in her brother; Gout in her brother and brother; Heart Problems in her brother; Heart attack in her brother; Hypertension in her brother; Osteoporosis in her mother; Stroke in her brother and mother. There is no history of Coronary artery disease, Anesthesia problems, Hypotension, Malignant  hyperthermia, or Pseudochol deficiency.  ROS:   Please see the history of present illness.    ROS  All other systems reviewed and negative.   EKGs/Labs/Other Studies Reviewed:    The following studies were reviewed today: PAP download  EKG:  EKG is not ordered today.   Recent Labs: 04/06/2017: Magnesium 2.0 11/16/2017: ALT 10; BUN 9; Creatinine, Ser 0.66; Hemoglobin 9.5; Platelets 182; Potassium 4.3; Sodium 142; TSH 2.440   Recent Lipid Panel    Component Value Date/Time   CHOL 190 11/16/2017 1206   TRIG 155 (H) 11/16/2017 1206   TRIG 161 (  H) 10/14/2016 1010   HDL 48 11/16/2017 1206   HDL 54 10/14/2016 1010   CHOLHDL 4.0 11/16/2017 1206   CHOLHDL 3.6 10/16/2008 0540   VLDL 23 10/16/2008 0540   LDLCALC 111 (H) 11/16/2017 1206   LDLCALC 138 (H) 02/26/2014 1001    Physical Exam:    VS:  BP 120/70   Pulse 83   Ht 5\' 6"  (1.676 m)   Wt 210 lb (95.3 kg)   SpO2 95%   BMI 33.89 kg/m     Wt Readings from Last 3 Encounters:  01/16/18 210 lb (95.3 kg)  11/16/17 210 lb (95.3 kg)  08/18/17 210 lb 6.4 oz (95.4 kg)     GEN:  Well nourished, well developed in no acute distress HEENT: Normal NECK: No JVD; No carotid bruits LYMPHATICS: No lymphadenopathy CARDIAC: RRR, no murmurs, rubs, gallops RESPIRATORY:  Clear to auscultation without rales, wheezing or rhonchi  ABDOMEN: Soft, non-tender, non-distended MUSCULOSKELETAL:  No edema; No deformity  SKIN: Warm and dry NEUROLOGIC:  Alert and oriented x 3 PSYCHIATRIC:  Normal affect   ASSESSMENT:    1. OSA on CPAP   2. Essential hypertension   3. Obesity (BMI 30.0-34.9)    PLAN:    In order of problems listed above:  1.  OSA  - the patient is tolerating PAP therapy well without any problems. The PAP download was reviewed today and showed an AHI of 0.1/hr on 9 cm H2O with 33% compliance in using more than 4 hours nightly.  The patient has been using and benefiting from PAP use and will continue to benefit from therapy.   She had not been using her device due to ulcers in her nose from severe allergies.  These seem to be fairly well-healed on I encouraged her to restart her Pap therapy.  I told her if the nasal pillow starts to irritate her nose again we will need to switch her to a nasal mask.  I will get a download in 4 weeks.  2.  Hypertension - BP is well controlled on exam today.  She will continue on Toprol XL 50 mg daily.  3.  Obesity - I have encouraged her to get into a routine exercise program and cut back on carbs and portions.      Medication Adjustments/Labs and Tests Ordered: Current medicines are reviewed at length with the patient today.  Concerns regarding medicines are outlined above.  No orders of the defined types were placed in this encounter.  No orders of the defined types were placed in this encounter.   Signed, Fransico Him, MD  01/16/2018 3:15 PM    [place]

## 2018-01-16 NOTE — Patient Instructions (Signed)
Your physician recommends that you continue on your current medications as directed. Please refer to the Current Medication list given to you today.  Your physician wants you to follow-up RT:MYTR WITH DR Radford Pax  You will receive a reminder letter in the mail two months in advance. If you don't receive a letter, please call our office to schedule the follow-up appointment.  GET DOWNLOAD IN 4 WEEKS

## 2018-01-16 NOTE — Telephone Encounter (Signed)
-----   Message from Richmond Campbell, LPN sent at 0/51/1021  3:28 PM EDT ----- PT NEEDS TO DO A DOWNLOAD IN [place]

## 2018-02-13 ENCOUNTER — Ambulatory Visit (INDEPENDENT_AMBULATORY_CARE_PROVIDER_SITE_OTHER): Payer: Medicare Other | Admitting: *Deleted

## 2018-02-13 DIAGNOSIS — D519 Vitamin B12 deficiency anemia, unspecified: Secondary | ICD-10-CM | POA: Diagnosis not present

## 2018-02-13 DIAGNOSIS — E538 Deficiency of other specified B group vitamins: Secondary | ICD-10-CM

## 2018-02-13 NOTE — Progress Notes (Signed)
Pt given Cyanocobalamin inj Tolerated well 

## 2018-02-16 ENCOUNTER — Telehealth: Payer: Self-pay | Admitting: *Deleted

## 2018-02-16 NOTE — Addendum Note (Signed)
Addended by: Freada Bergeron on: 02/16/2018 02:44 PM   Modules accepted: Level of Service, SmartSet

## 2018-02-16 NOTE — Telephone Encounter (Signed)
Patient is aware and agreeable to AHI being within range at 0.2. Patient is aware and agreeable to improving on compliance with machine usage. Pt is aware and agreeable to treatment.

## 2018-02-16 NOTE — Telephone Encounter (Signed)
-----   Message from [name], MD sent at 02/15/2018  1:31 PM EDT ----- Good AHI on PAP but needs to improve compliance

## 2018-02-16 NOTE — Telephone Encounter (Signed)
This encounter was created in error - please disregard.

## 2018-02-16 NOTE — Telephone Encounter (Signed)
Patient states she has a sore on the inside of her nose and it is painful to wear her mask but now it is getting better and she has started back using her unit.

## 2018-02-20 NOTE — Progress Notes (Signed)
Cardiology Office Note  Date: 02/21/2018   ID: Jazlyne, Gauger 1935-05-27, MRN [id number]  PCP: Chipper Herb, MD  Primary Cardiologist: Rozann Lesches, MD   Chief Complaint  Patient presents with  . PAF    History of Present Illness: Jenna [name] is an 82 y.o. female last seen in March.  She presents for a routine follow-up visit.  Does not report any progressive sense of palpitations, no chest pain with exertion.  She has been fatigued over the summer months.  Also having worsening leg swelling despite putting her feet up at times and using compression stockings.  Interval follow-up noted with Dr. Radford Pax in August for management of OSA.  She is on CPAP.  States that she has been compliant.  Interval lab work from June is outlined below.  She was found to be anemic with hemoglobin 9.5 down from normal range.  She is following with Dr. Laurance Flatten.  Currently being repleted with vitamin B12.  She has a follow-up visit this month with repeat levels.  Does not report any obvious melena or hematochezia.  I reviewed her medications which are outlined below.  I personally reviewed her ECG today which shows sinus rhythm with PVC and low voltage.  Past Medical History:  Diagnosis Date  . Cataract   . DDD (degenerative disc disease)   . Fibromyalgia   . Herpes zoster   . History of skin cancer   . Hyperlipidemia   . Hypertension   . Hypothyroidism   . OA (osteoarthritis)   . OSA on CPAP 01/16/2018   Mild OSA overall with an AHI of 5.8/h but during REM sleep moderate with an AHI of 28/h.  Oxygen saturations dropped to 76% during respiratory events.  Now on CPAP at 9 cm H2O   . Osteoporosis   . Palpitations   . Pre-diabetes   . PUD (peptic ulcer disease)   . Skin cancer of nose    [name]    Past Surgical History:  Procedure Laterality Date  . ABDOMINAL HYSTERECTOMY     partial  . BREAST BIOPSY     Right  . CATARACT EXTRACTION W/PHACO  10/28/2011   Procedure:  CATARACT EXTRACTION PHACO AND INTRAOCULAR LENS PLACEMENT (IOC);  Surgeon: Tonny Branch, MD;  Location: [facility];  Service: Ophthalmology;  Laterality: Right;  CDE 18.38  . CATARACT EXTRACTION W/PHACO  02/14/2012   Procedure: CATARACT EXTRACTION PHACO AND INTRAOCULAR LENS PLACEMENT (IOC);  Surgeon: Tonny Branch, MD;  Location: [facility];  Service: Ophthalmology;  Laterality: Left;  CDE 17.20  . CESAREAN SECTION    . COLONOSCOPY N/A 05/31/2013   Procedure: COLONOSCOPY;  Surgeon: Rogene Houston, MD;  Location: [facility];  Service: Endoscopy;  Laterality: N/A;  240  . EYE SURGERY     skin removal   . FRACTURE SURGERY  1952   fractured right arm    Current Outpatient Medications  Medication Sig Dispense Refill  . Cholecalciferol (VITAMIN D3) 2000 UNITS TABS Take 1 tablet by mouth daily.      . colchicine 0.6 MG tablet Take 1 tablet (0.6 mg total) by mouth daily as needed (GOUT FLARE UP). 30 tablet 0  . diltiazem (CARDIZEM) 30 MG tablet Take 1 tablet every 4 hours AS NEEDED for AFIB heart rate over 100 45 tablet 1  . furosemide (LASIX) 40 MG tablet Take 1 tablet (40 mg total) by mouth daily as needed. 30 tablet 3  . hydrocortisone (ANUSOL-HC) 2.5 %  rectal cream APPLY RECTALLY DAILY AS NEEDED FOR HEMMORHOIDS OR ITCHING. 30 g 2  . levothyroxine (SYNTHROID, LEVOTHROID) 75 MCG tablet TAKE 1 TABLET ONCE DAILY. 30 tablet 8  . metoprolol succinate (TOPROL-XL) 50 MG 24 hr tablet TAKE 1 TABLET DAILY, TAKE WITH OR IMMEDIATELY FOLLOWING A MEAL 30 tablet 0  . Misc. Devices MISC by Does not apply route. CPAP    . XARELTO 20 MG TABS tablet TAKE 1 TABLET EVERYDAY WITH SUPPER. 30 tablet 2   Current Facility-Administered Medications  Medication Dose Route Frequency Provider Last Rate Last Dose  . cyanocobalamin ((VITAMIN B-12)) injection 1,000 mcg  1,000 mcg Intramuscular Q30 days Chipper Herb, MD   1,000 mcg at 02/13/18 0920   Allergies:  Daypro [oxaprozin]; Diclofenac sodium; Effexor [venlafaxine hydrochloride];  Penicillins; Prozac [fluoxetine hcl]; Sulfa antibiotics; Amitriptyline; Cymbalta [duloxetine hcl]; Lipitor [atorvastatin calcium]; Milnacipran hcl; Savella [milnacipran hcl]; and Zocor [simvastatin]   Social History: The patient  reports that she has never smoked. She has never used smokeless tobacco. She reports that she does not drink alcohol or use drugs.   ROS:  Please see the history of present illness. Otherwise, complete review of systems is positive for none.  All other systems are reviewed and negative.   Physical Exam: VS:  BP 124/64   Pulse 67   Ht 5\' 6"  (1.676 m)   Wt 212 lb 12.8 oz (96.5 kg)   SpO2 98%   BMI 34.35 kg/m , BMI Body mass index is 34.35 kg/m.  Wt Readings from Last 3 Encounters:  02/21/18 212 lb 12.8 oz (96.5 kg)  01/16/18 210 lb (95.3 kg)  11/16/17 210 lb (95.3 kg)    General: Patient appears comfortable at rest. HEENT: Conjunctiva and lids normal, oropharynx clear. Neck: Supple, no elevated JVP or carotid bruits, no thyromegaly. Lungs: Clear to auscultation, nonlabored breathing at rest. Cardiac: Regular rate and rhythm, no S3 or significant systolic murmur. Abdomen: Soft, nontender, bowel sounds present. Extremities: 2+ lower leg edema, distal pulses 2+. Skin: Warm and dry. Musculoskeletal: No kyphosis. Neuropsychiatric: Alert and oriented x3, affect grossly appropriate.  ECG: I personally reviewed the tracing from 04/11/2017 which showed normal sinus rhythm.  Recent Labwork: 04/06/2017: Magnesium 2.0 11/16/2017: ALT 10; AST 17; BUN 9; Creatinine, Ser 0.66; Hemoglobin 9.5; Platelets 182; Potassium 4.3; Sodium 142; TSH 2.440     Component Value Date/Time   CHOL 190 11/16/2017 1206   TRIG 155 (H) 11/16/2017 1206   TRIG 161 (H) 10/14/2016 1010   HDL 48 11/16/2017 1206   HDL 54 10/14/2016 1010   CHOLHDL 4.0 11/16/2017 1206   CHOLHDL 3.6 10/16/2008 0540   VLDL 23 10/16/2008 0540   LDLCALC 111 (H) 11/16/2017 1206   LDLCALC 138 (H) 02/26/2014 1001      Other Studies Reviewed Today:  Echocardiogram 08/20/2014: Study Conclusions  - Left ventricle: The cavity size was normal. Wall thickness was increased in a pattern of mild LVH. Systolic function was normal. The estimated ejection fraction was in the range of 60% to 65%. Doppler parameters are consistent with abnormal left ventricular relaxation (grade 1 diastolic dysfunction). - Aortic valve: There was mild regurgitation. Valve area (VTI): 2.38 cm^2. Valve area (Vmax): 2.32 cm^2. - Atrial septum: No defect or patent foramen ovale was identified. - Systemic veins: IVC is dilated with normal respiratory variation, estimated RA pressure is 8 mmHg. - Technically difficult study.  Assessment and Plan:  1.  Paroxysmal atrial fibrillation/flutter.  CHADSVASC score is 3.  She is continuing  on Toprol-XL and Xarelto, has not needed any short acting Cardizem.  She does not report any worsening sense of palpitations.  2.  Bilateral leg edema/probable lymphedema.  Discussed elevating legs when able, also using compression stockings.  May also increase Lasix intermittently for exacerbations.  We will obtain a follow-up echocardiogram to ensure no decline in LVEF compared to 2016 as well.  3.  Fatigue, noted to be anemic in June with hemoglobin 9.5.  Continue to follow with Dr. Laurance Flatten.  If levels have not improved on B12 may need GI work-up.  4.  Essential hypertension, blood pressure is well controlled today.  Current medicines were reviewed with the patient today.   Orders Placed This Encounter  Procedures  . EKG 12-Lead  . ECHOCARDIOGRAM COMPLETE    Disposition: Follow-up in 6 months.  Signed, Satira Sark, MD, [facility] 02/21/2018 3:55 PM    [facility] [place], [place], [place] [address] Phone: [phone]; Fax: [phone]

## 2018-02-21 ENCOUNTER — Ambulatory Visit (INDEPENDENT_AMBULATORY_CARE_PROVIDER_SITE_OTHER): Payer: Medicare Other | Admitting: Cardiology

## 2018-02-21 ENCOUNTER — Encounter: Payer: Self-pay | Admitting: Cardiology

## 2018-02-21 VITALS — BP 124/64 | HR 67 | Ht 66.0 in | Wt 212.8 lb

## 2018-02-21 DIAGNOSIS — R600 Localized edema: Secondary | ICD-10-CM

## 2018-02-21 DIAGNOSIS — I10 Essential (primary) hypertension: Secondary | ICD-10-CM

## 2018-02-21 DIAGNOSIS — I480 Paroxysmal atrial fibrillation: Secondary | ICD-10-CM | POA: Diagnosis not present

## 2018-02-21 DIAGNOSIS — D649 Anemia, unspecified: Secondary | ICD-10-CM

## 2018-02-21 NOTE — Patient Instructions (Addendum)
Medication Instructions:   Your physician has recommended you make the following change in your medication:   Take furosemide 60 mg daily for 2-3 days, then resume 40 mg daily.  Continue all other medications the same.  Labwork:  NONE  Testing/Procedures: Your physician has requested that you have an echocardiogram. Echocardiography is a painless test that uses sound waves to create images of your heart. It provides your doctor with information about the size and shape of your heart and how well your heart's chambers and valves are working. This procedure takes approximately one hour. There are no restrictions for this procedure.  Follow-Up:  Your physician recommends that you schedule a follow-up appointment in: 6 months. You will receive a reminder letter in the mail in about 4 months reminding you to call and schedule your appointment. If you don't receive this letter, please contact our office.  Any Other Special Instructions Will Be Listed Below (If Applicable).  If you need a refill on your cardiac medications before your next appointment, please call your pharmacy.

## 2018-02-28 ENCOUNTER — Other Ambulatory Visit: Payer: Self-pay | Admitting: Family Medicine

## 2018-03-02 ENCOUNTER — Ambulatory Visit (INDEPENDENT_AMBULATORY_CARE_PROVIDER_SITE_OTHER): Payer: Medicare Other | Admitting: Physician Assistant

## 2018-03-02 VITALS — BP 128/53 | HR 63 | Temp 99.1°F | Ht 66.0 in | Wt 209.0 lb

## 2018-03-02 DIAGNOSIS — K047 Periapical abscess without sinus: Secondary | ICD-10-CM

## 2018-03-02 MED ORDER — CLINDAMYCIN HCL 150 MG PO CAPS: 150.0000 mg | ORAL_CAPSULE | Freq: Three times a day (TID) | ORAL | 0 refills | Status: DC

## 2018-03-06 ENCOUNTER — Encounter: Payer: Self-pay | Admitting: Physician Assistant

## 2018-03-06 NOTE — Progress Notes (Signed)
BP (!) 128/53   Pulse 63   Temp 99.1 F (37.3 C) (Oral)   Ht 5\' 6"  (1.676 m)   Wt 209 lb (94.8 kg)   BMI 33.73 kg/m    Subjective:    Patient ID: Jenna [name], female    DOB: 1935-12-03, 82 y.o.   MRN: [id number]  HPI: Jenna [name] is a 82 y.o. female presenting on 03/02/2018 for Dental Pain  She has had dental pain and swelling in the lower left jaw. She will get will her dentist next week, they are out of town.  She denies fever and chills. Medications and allergies are reviewed. There are no other complaints at this time.  Past Medical History:  Diagnosis Date  . Cataract   . DDD (degenerative disc disease)   . Fibromyalgia   . Herpes zoster   . History of skin cancer   . Hyperlipidemia   . Hypertension   . Hypothyroidism   . OA (osteoarthritis)   . OSA on CPAP 01/16/2018   Mild OSA overall with an AHI of 5.8/h but during REM sleep moderate with an AHI of 28/h.  Oxygen saturations dropped to 76% during respiratory events.  Now on CPAP at 9 cm H2O   . Osteoporosis   . Palpitations   . Pre-diabetes   . PUD (peptic ulcer disease)   . Skin cancer of nose    [name]   Relevant past medical, surgical, family and social history reviewed and updated as indicated. Interim medical history since our last visit reviewed. Allergies and medications reviewed and updated. DATA REVIEWED: CHART IN EPIC  Family History reviewed for pertinent findings.  Review of Systems  Constitutional: Negative.  Negative for fatigue and fever.  HENT: Positive for dental problem. Negative for congestion.   Eyes: Negative.   Respiratory: Negative.   Gastrointestinal: Negative.   Genitourinary: Negative.     Allergies as of 03/02/2018      Reactions   Daypro [oxaprozin]    Diclofenac Sodium    Effexor [venlafaxine Hydrochloride]    Penicillins    Prozac [fluoxetine Hcl]    Sulfa Antibiotics    Amitriptyline Rash   Cymbalta [duloxetine Hcl] Nausea Only   Lipitor  [atorvastatin Calcium] Other (See Comments)   Leg aches   Milnacipran Hcl Rash   Savella [milnacipran Hcl] Rash   Zocor [simvastatin] Other (See Comments)   Hip pain      Medication List        Accurate as of 03/02/18 11:59 PM. Always use your most recent med list.          clindamycin 150 MG capsule Commonly known as:  CLEOCIN Take 1 capsule (150 mg total) by mouth 3 (three) times daily.   COLCRYS 0.6 MG tablet Generic drug:  colchicine TAKE 1 TABLET DAILY AS NEEDED GOUT FLARE UP.   diltiazem 30 MG tablet Commonly known as:  CARDIZEM Take 1 tablet every 4 hours AS NEEDED for AFIB heart rate over 100   furosemide 40 MG tablet Commonly known as:  LASIX Take 1 tablet (40 mg total) by mouth daily as needed.   hydrocortisone 2.5 % rectal cream Commonly known as:  ANUSOL-HC APPLY RECTALLY DAILY AS NEEDED FOR HEMMORHOIDS OR ITCHING.   levothyroxine 75 MCG tablet Commonly known as:  SYNTHROID, LEVOTHROID TAKE 1 TABLET ONCE DAILY.   metoprolol succinate 50 MG 24 hr tablet Commonly known as:  TOPROL-XL TAKE 1 TABLET DAILY, TAKE WITH OR IMMEDIATELY FOLLOWING A  MEAL   Misc. Devices Misc by Does not apply route. CPAP   Vitamin D3 2000 units Tabs Take 1 tablet by mouth daily.   XARELTO 20 MG Tabs tablet Generic drug:  rivaroxaban TAKE 1 TABLET EVERYDAY WITH SUPPER.          Objective:    BP (!) 128/53   Pulse 63   Temp 99.1 F (37.3 C) (Oral)   Ht 5\' 6"  (1.676 m)   Wt 209 lb (94.8 kg)   BMI 33.73 kg/m   Allergies  Allergen Reactions  . Daypro [Oxaprozin]   . Diclofenac Sodium   . Effexor [Venlafaxine Hydrochloride]   . Penicillins   . Prozac [Fluoxetine Hcl]   . Sulfa Antibiotics   . Amitriptyline Rash  . Cymbalta [Duloxetine Hcl] Nausea Only  . Lipitor [Atorvastatin Calcium] Other (See Comments)    Leg aches  . Milnacipran Hcl Rash  . Savella [Milnacipran Hcl] Rash  . Zocor [Simvastatin] Other (See Comments)    Hip pain    Wt Readings from  Last 3 Encounters:  03/02/18 209 lb (94.8 kg)  02/21/18 212 lb 12.8 oz (96.5 kg)  01/16/18 210 lb (95.3 kg)    Physical Exam  Constitutional: She is oriented to person, place, and time. She appears well-developed and well-nourished.  HENT:  Head: Normocephalic and atraumatic.  Mouth/Throat: No oral lesions. Dental abscesses and dental caries present. No uvula swelling.    Eyes: Pupils are equal, round, and reactive to light. Conjunctivae and EOM are normal.  Cardiovascular: Normal rate, regular rhythm, normal heart sounds and intact distal pulses.  Pulmonary/Chest: Effort normal and breath sounds normal.  Abdominal: Soft. Bowel sounds are normal.  Neurological: She is alert and oriented to person, place, and time. She has normal reflexes.  Skin: Skin is warm and dry. No rash noted.  Psychiatric: She has a normal mood and affect. Her behavior is normal. Judgment and thought content normal.        Assessment & Plan:   1. Dental abscess - clindamycin (CLEOCIN) 150 MG capsule; Take 1 capsule (150 mg total) by mouth 3 (three) times daily.  Dispense: 30 capsule; Refill: 0   Continue all other maintenance medications as listed above.  Follow up plan: No follow-ups on file.  Educational handout given for Warren PA-C [facility] [address]  [place], [place] [address] [phone]   03/06/2018, 3:20 PM

## 2018-03-13 ENCOUNTER — Telehealth: Payer: Self-pay | Admitting: Cardiology

## 2018-03-13 NOTE — Telephone Encounter (Signed)
°  Precert needed for: Echo    Location: [facility]     Date: Mar 29, 2018

## 2018-03-15 ENCOUNTER — Ambulatory Visit (INDEPENDENT_AMBULATORY_CARE_PROVIDER_SITE_OTHER): Payer: Medicare Other | Admitting: Family Medicine

## 2018-03-15 ENCOUNTER — Encounter: Payer: Self-pay | Admitting: Family Medicine

## 2018-03-15 VITALS — BP 124/57 | HR 68 | Temp 98.0°F | Ht 66.0 in | Wt 207.0 lb

## 2018-03-15 DIAGNOSIS — I480 Paroxysmal atrial fibrillation: Secondary | ICD-10-CM | POA: Diagnosis not present

## 2018-03-15 DIAGNOSIS — E559 Vitamin D deficiency, unspecified: Secondary | ICD-10-CM

## 2018-03-15 DIAGNOSIS — D519 Vitamin B12 deficiency anemia, unspecified: Secondary | ICD-10-CM | POA: Diagnosis not present

## 2018-03-15 DIAGNOSIS — I10 Essential (primary) hypertension: Secondary | ICD-10-CM | POA: Diagnosis not present

## 2018-03-15 DIAGNOSIS — E039 Hypothyroidism, unspecified: Secondary | ICD-10-CM

## 2018-03-15 DIAGNOSIS — D508 Other iron deficiency anemias: Secondary | ICD-10-CM | POA: Diagnosis not present

## 2018-03-15 DIAGNOSIS — E7800 Pure hypercholesterolemia, unspecified: Secondary | ICD-10-CM | POA: Diagnosis not present

## 2018-03-15 DIAGNOSIS — R710 Precipitous drop in hematocrit: Secondary | ICD-10-CM

## 2018-03-15 DIAGNOSIS — E538 Deficiency of other specified B group vitamins: Secondary | ICD-10-CM | POA: Diagnosis not present

## 2018-03-15 NOTE — Progress Notes (Signed)
Subjective:    Patient ID: Jenna [name], female    DOB: [birth year], 82 y.o.   MRN: [id number]  HPI Pt here for follow up and management of chronic medical problems which includes hypertension, hyperlipidemia and hypothyroid. She is taking medication regularly.  The patient complains of low back pain and stiffness.  She also complains of cramps in her muscles at times.  She was given an FOBT to return today and will get lab work today.  The patient has atrial fibrillation.  She also has hyperlipidemia hypertension and hypothyroidism.  Saw Dr. Domenic Polite this month for follow-up of her atrial fib, he is her cardiologist.  He encouraged her to take the Lasix more if needed for swelling and edema.  He is also concerned about her hemoglobin being low at 9.5 and if it did not improve on B12 that she may need a GI work-up and I would agree with that.  Her blood pressure is well controlled today.  The patient's last colonoscopy was in January 2015.  There is no actual report as this was done in [place].  Patient does have her ongoing back pain.  She is scheduled to see the orthopedist tomorrow to have injections in both knees.  She is a little bit late with her B12 shots.  She denies any chest pain or shortness of breath anymore than usual.  She has some shortness of breath with climbing hills but says this is normal for her.  She denies any heartburn indigestion nausea vomiting diarrhea black tarry stools.  She may have some bright red blood in the stool if she is constipated but this does not happen often.  She is passing her water without problems.  The patient says that she is intolerant of iron that it causes severe abdominal pain.  She has seen Dr. Laural Golden in the past.   Patient Active Problem List   Diagnosis Date Noted  . OSA on CPAP 01/16/2018  . B12 deficiency anemia 07/15/2017  . Right knee pain 01/30/2015  . Hypertension   . Precordial pain 02/05/2014  . Palpitations 02/05/2014  . History of  shingles 01/11/2014  . Pre-diabetes 11/21/2013  . Obesity (BMI 30.0-34.9) 11/21/2013  . Mixed hypercholesterolemia and hypertriglyceridemia 11/21/2013  . Metabolic syndrome 55/73/2202  . Multiple drug allergies 10/23/2013  . Statin intolerance 10/23/2013  . Osteopenia of the elderly 07/25/2013  . Vitamin D deficiency 06/19/2013  . Unspecified constipation 04/23/2013  . Rectal bleeding 04/23/2013  . Gout 02/21/2013  . Osteoarthritis 02/21/2013  . Fibromyalgia   . Hyperlipidemia 11/14/2008   Outpatient Encounter Medications as of 03/15/2018  Medication Sig  . Cholecalciferol (VITAMIN D3) 2000 UNITS TABS Take 1 tablet by mouth daily.    [name] COLCRYS 0.6 MG tablet TAKE 1 TABLET DAILY AS NEEDED GOUT FLARE UP.  . diltiazem (CARDIZEM) 30 MG tablet Take 1 tablet every 4 hours AS NEEDED for AFIB heart rate over 100  . furosemide (LASIX) 40 MG tablet Take 1 tablet (40 mg total) by mouth daily as needed.  . hydrocortisone (ANUSOL-HC) 2.5 % rectal cream APPLY RECTALLY DAILY AS NEEDED FOR HEMMORHOIDS OR ITCHING.  [name] levothyroxine (SYNTHROID, LEVOTHROID) 75 MCG tablet TAKE 1 TABLET ONCE DAILY.  . metoprolol succinate (TOPROL-XL) 50 MG 24 hr tablet TAKE 1 TABLET DAILY, TAKE WITH OR IMMEDIATELY FOLLOWING A MEAL  . Misc. Devices MISC by Does not apply route. CPAP  . XARELTO 20 MG TABS tablet TAKE 1 TABLET EVERYDAY WITH SUPPER.  . [DISCONTINUED] clindamycin (  CLEOCIN) 150 MG capsule Take 1 capsule (150 mg total) by mouth 3 (three) times daily.   Facility-Administered Encounter Medications as of 03/15/2018  Medication  . cyanocobalamin ((VITAMIN B-12)) injection 1,000 mcg      Review of Systems  Constitutional: Negative.   HENT: Negative.        Recent toothache=better/ completed antibiotic  Eyes: Negative.   Respiratory: Negative.   Cardiovascular: Negative.   Gastrointestinal: Negative.   Endocrine: Negative.   Genitourinary: Negative.   Musculoskeletal: Positive for back pain (low back /  stiffness ). Myalgias: muscle cramps        Recent left hip pain - better Recent gout attack - left great toe - better  Skin: Negative.   Allergic/Immunologic: Negative.   Neurological: Negative.   Hematological: Negative.   Psychiatric/Behavioral: Negative.        Objective:   Physical Exam  Constitutional: She is oriented to person, place, and time. She appears well-developed and well-nourished. No distress.  The patient is pleasant and alert and looks much younger than her stated age of 13 years.  HENT:  Head: Normocephalic and atraumatic.  Right Ear: External ear normal.  Left Ear: External ear normal.  Nose: Nose normal.  Mouth/Throat: Oropharynx is clear and moist. No oropharyngeal exudate.  Eyes: Pupils are equal, round, and reactive to light. Conjunctivae and EOM are normal. Right eye exhibits no discharge. Left eye exhibits no discharge. No scleral icterus.  She is up-to-date on her eye exams.  Neck: Normal range of motion. Neck supple. No thyromegaly present.  No bruits thyromegaly or anterior cervical adenopathy  Cardiovascular: Normal rate, regular rhythm, normal heart sounds and intact distal pulses.  No murmur heard. Heart today has a regular rate and rhythm at 72/min good pedal pulses were present.  There was no pitting edema.  Pulmonary/Chest: Effort normal and breath sounds normal. She has no wheezes. She has no rales.  Lungs are clear anteriorly and posteriorly  Abdominal: Soft. Bowel sounds are normal. She exhibits no mass. There is tenderness.  The abdomen is obese but soft and has slight epigastric tenderness.  There is no liver or spleen enlargement.  There were no bruits.  There is no inguinal adenopathy or masses present.  Musculoskeletal: Normal range of motion. She exhibits no edema, tenderness or deformity.  Some joint line tenderness at left knee  Lymphadenopathy:    She has no cervical adenopathy.  Neurological: She is alert and oriented to person,  place, and time. She has normal reflexes. No cranial nerve deficit.  Reflexes in both lower extremities were diminished bilaterally.  Skin: Skin is warm and dry. No rash noted.  Psychiatric: She has a normal mood and affect. Her behavior is normal. Judgment and thought content normal.  The patient's mood affect and behavior were all normal for her.   Nursing note and vitals reviewed.  BP (!) 124/57 (BP Location: Left Arm)   Pulse 68   Temp 98 F (36.7 C) (Oral)   Ht '5\' 6"'$  (1.676 m)   Wt 207 lb (93.9 kg)   BMI 33.41 kg/m         Assessment & Plan:  1. Essential hypertension -The blood pressure is good today and she will continue with current treatment - BMP8+EGFR - CBC with Differential/Platelet - Hepatic function panel  2. Pure hypercholesterolemia -Continue with as aggressive therapeutic lifestyle changes as possible. - CBC with Differential/Platelet - Lipid panel  3. Hypothyroidism, unspecified type -Continue with current treatment pending results  of lab work - CBC with Differential/Platelet - Thyroid Panel With TSH  4. Vitamin D deficiency -Continue with vitamin D replacement pending results of lab work - CBC with Differential/Platelet - VITAMIN D 25 Hydroxy (Vit-D Deficiency, Fractures)  5. Vitamin B12 deficiency -She is slightly late for her B12 injection and we will go ahead and check a B12 level today. - CBC with Differential/Platelet - Vitamin B12  6. Paroxysmal atrial fibrillation (HCC) -Patient was in normal sinus rhythm today at 72/min - CBC with Differential/Platelet  7. Hemoglobin decreased -Patient is iron intolerant.  When she had her last colonoscopy she was not told that she needed another colonoscopy anytime soon.  This was with Dr. Wonda Amis - Ferritin - Iron and TIBC([facility])  8. B12 deficiency -Continue with B12 injections  9. Other iron deficiency anemia -Check iron studies -If hemoglobin remains low or FOBT is positive may  consider referring back to gastroenterologist.  Patient Instructions                       Medicare Annual Wellness Visit  [facility] and the medical providers at [facility] strive to bring you the best medical care.  In doing so we not only want to address your current medical conditions and concerns but also to detect new conditions early and prevent illness, disease and health-related problems.    Medicare offers a yearly Wellness Visit which allows our clinical staff to assess your need for preventative services including immunizations, lifestyle education, counseling to decrease risk of preventable diseases and screening for fall risk and other medical concerns.    This visit is provided free of charge (no copay) for all Medicare recipients. The clinical pharmacists at [facility] have begun to conduct these Wellness Visits which will also include a thorough review of all your medications.    As you primary medical provider recommend that you make an appointment for your Annual Wellness Visit if you have not done so already this year.  You may set up this appointment before you leave today or you may call back ([phone]) and schedule an appointment.  Please make sure when you call that you mention that you are scheduling your Annual Wellness Visit with the clinical pharmacist so that the appointment may be made for the proper length of time.     Continue current medications. Continue good therapeutic lifestyle changes which include good diet and exercise. Fall precautions discussed with patient. If an FOBT was given today- please return it to our front desk. If you are over 40 years old - you may need Prevnar 17 or the adult Pneumonia vaccine.  **Flu shots are available--- please call and schedule a FLU-CLINIC appointment**  After your visit with [place] today you will receive a survey in the mail or online from [employer] regarding your care  with [place]. Please take a moment to fill this out. Your feedback is very important to [place] as you can help [place] better understand your patient needs as well as improve your experience and satisfaction. WE CARE ABOUT YOU!!!   Move slowly and watch you are going Always be careful and do not put yourself at risk for falling Return the FOBT card Watch diet closely and make every effort to keep weight as low as possible Follow-up with cardiology as planned If hemoglobin remains low we will do a referral to the gastroenterologist.  Remind him that you are iron intolerant.  Timmothy Sours  Jennette Bill MD

## 2018-03-15 NOTE — Patient Instructions (Addendum)
Medicare Annual Wellness Visit  [facility] and the medical providers at [facility] strive to bring you the best medical care.  In doing so we not only want to address your current medical conditions and concerns but also to detect new conditions early and prevent illness, disease and health-related problems.    Medicare offers a yearly Wellness Visit which allows our clinical staff to assess your need for preventative services including immunizations, lifestyle education, counseling to decrease risk of preventable diseases and screening for fall risk and other medical concerns.    This visit is provided free of charge (no copay) for all Medicare recipients. The clinical pharmacists at [facility] have begun to conduct these Wellness Visits which will also include a thorough review of all your medications.    As you primary medical provider recommend that you make an appointment for your Annual Wellness Visit if you have not done so already this year.  You may set up this appointment before you leave today or you may call back ([phone]) and schedule an appointment.  Please make sure when you call that you mention that you are scheduling your Annual Wellness Visit with the clinical pharmacist so that the appointment may be made for the proper length of time.     Continue current medications. Continue good therapeutic lifestyle changes which include good diet and exercise. Fall precautions discussed with patient. If an FOBT was given today- please return it to our front desk. If you are over 25 years old - you may need Prevnar 48 or the adult Pneumonia vaccine.  **Flu shots are available--- please call and schedule a FLU-CLINIC appointment**  After your visit with [place] today you will receive a survey in the mail or online from [employer] regarding your care with [place]. Please take a moment to fill this out. Your feedback is very  important to [place] as you can help [place] better understand your patient needs as well as improve your experience and satisfaction. WE CARE ABOUT YOU!!!   Move slowly and watch you are going Always be careful and do not put yourself at risk for falling Return the FOBT card Watch diet closely and make every effort to keep weight as low as possible Follow-up with cardiology as planned If hemoglobin remains low we will do a referral to the gastroenterologist.  Remind him that you are iron intolerant.

## 2018-03-16 ENCOUNTER — Other Ambulatory Visit: Payer: Medicare Other

## 2018-03-16 DIAGNOSIS — M1711 Unilateral primary osteoarthritis, right knee: Secondary | ICD-10-CM | POA: Diagnosis not present

## 2018-03-16 DIAGNOSIS — M1712 Unilateral primary osteoarthritis, left knee: Secondary | ICD-10-CM | POA: Diagnosis not present

## 2018-03-16 DIAGNOSIS — M25552 Pain in left hip: Secondary | ICD-10-CM | POA: Diagnosis not present

## 2018-03-16 LAB — THYROID PANEL WITH TSH
FREE THYROXINE INDEX: 3 (ref 1.2–4.9)
T3 UPTAKE RATIO: 27 % (ref 24–39)
T4 TOTAL: 11 ug/dL (ref 4.5–12.0)
TSH: 3.16 u[IU]/mL (ref 0.450–4.500)

## 2018-03-16 LAB — CBC WITH DIFFERENTIAL/PLATELET
BASOS: 1 %
Basophils Absolute: 0 10*3/uL (ref 0.0–0.2)
EOS (ABSOLUTE): 0.1 10*3/uL (ref 0.0–0.4)
EOS: 2 %
HEMOGLOBIN: 8.9 g/dL — AB (ref 11.1–15.9)
Hematocrit: 29.2 % — ABNORMAL LOW (ref 34.0–46.6)
IMMATURE GRANS (ABS): 0 10*3/uL (ref 0.0–0.1)
IMMATURE GRANULOCYTES: 0 %
LYMPHS: 22 %
Lymphocytes Absolute: 1.2 10*3/uL (ref 0.7–3.1)
MCH: 22.9 pg — ABNORMAL LOW (ref 26.6–33.0)
MCHC: 30.5 g/dL — ABNORMAL LOW (ref 31.5–35.7)
MCV: 75 fL — AB (ref 79–97)
MONOCYTES: 9 %
Monocytes Absolute: 0.5 10*3/uL (ref 0.1–0.9)
NEUTROS PCT: 66 %
Neutrophils Absolute: 3.7 10*3/uL (ref 1.4–7.0)
PLATELETS: 267 10*3/uL (ref 150–450)
RBC: 3.88 x10E6/uL (ref 3.77–5.28)
RDW: 13.9 % (ref 12.3–15.4)
WBC: 5.6 10*3/uL (ref 3.4–10.8)

## 2018-03-16 LAB — HEPATIC FUNCTION PANEL
ALT: 19 IU/L (ref 0–32)
AST: 21 IU/L (ref 0–40)
Albumin: 4.1 g/dL (ref 3.5–4.7)
Alkaline Phosphatase: 109 IU/L (ref 39–117)
BILIRUBIN, DIRECT: 0.14 mg/dL (ref 0.00–0.40)
Bilirubin Total: 0.6 mg/dL (ref 0.0–1.2)
Total Protein: 6.1 g/dL (ref 6.0–8.5)

## 2018-03-16 LAB — VITAMIN B12: Vitamin B-12: 380 pg/mL (ref 232–1245)

## 2018-03-16 LAB — BMP8+EGFR
BUN/Creatinine Ratio: 14 (ref 12–28)
BUN: 9 mg/dL (ref 8–27)
CALCIUM: 9 mg/dL (ref 8.7–10.3)
CO2: 24 mmol/L (ref 20–29)
CREATININE: 0.66 mg/dL (ref 0.57–1.00)
Chloride: 99 mmol/L (ref 96–106)
GFR calc Af Amer: 96 mL/min/{1.73_m2} (ref >59)
GFR, EST NON AFRICAN AMERICAN: 84 mL/min/{1.73_m2} (ref >59)
Glucose: 115 mg/dL — ABNORMAL HIGH (ref 65–99)
POTASSIUM: 4.7 mmol/L (ref 3.5–5.2)
Sodium: 138 mmol/L (ref 134–144)

## 2018-03-16 LAB — LIPID PANEL
Chol/HDL Ratio: 4.2 ratio (ref 0.0–4.4)
Cholesterol, Total: 204 mg/dL — ABNORMAL HIGH (ref 100–199)
HDL: 49 mg/dL (ref >39)
LDL Calculated: 121 mg/dL — ABNORMAL HIGH (ref 0–99)
Triglycerides: 169 mg/dL — ABNORMAL HIGH (ref 0–149)
VLDL CHOLESTEROL CAL: 34 mg/dL (ref 5–40)

## 2018-03-16 LAB — VITAMIN D 25 HYDROXY (VIT D DEFICIENCY, FRACTURES): VIT D 25 HYDROXY: 37.7 ng/mL (ref 30.0–100.0)

## 2018-03-16 LAB — IRON AND TIBC
IRON SATURATION: 6 % — AB (ref 15–55)
Iron: 22 ug/dL — ABNORMAL LOW (ref 27–139)
Total Iron Binding Capacity: 378 ug/dL (ref 250–450)
UIBC: 356 ug/dL (ref 118–369)

## 2018-03-16 LAB — FERRITIN: Ferritin: 12 ng/mL — ABNORMAL LOW (ref 15–150)

## 2018-03-17 ENCOUNTER — Telehealth: Payer: Self-pay | Admitting: Family Medicine

## 2018-03-17 NOTE — Telephone Encounter (Signed)
Went over all labs with patient, states understanding

## 2018-03-23 ENCOUNTER — Ambulatory Visit (HOSPITAL_COMMUNITY): Payer: Medicare Other | Admitting: Internal Medicine

## 2018-03-25 ENCOUNTER — Other Ambulatory Visit: Payer: Self-pay | Admitting: Cardiology

## 2018-03-25 ENCOUNTER — Other Ambulatory Visit: Payer: Self-pay | Admitting: Family Medicine

## 2018-03-27 ENCOUNTER — Ambulatory Visit (HOSPITAL_COMMUNITY): Payer: Medicare Other | Admitting: Internal Medicine

## 2018-03-29 ENCOUNTER — Other Ambulatory Visit: Payer: Self-pay

## 2018-03-29 ENCOUNTER — Telehealth: Payer: Self-pay | Admitting: *Deleted

## 2018-03-29 ENCOUNTER — Ambulatory Visit (INDEPENDENT_AMBULATORY_CARE_PROVIDER_SITE_OTHER): Payer: Medicare Other

## 2018-03-29 DIAGNOSIS — I480 Paroxysmal atrial fibrillation: Secondary | ICD-10-CM | POA: Diagnosis not present

## 2018-03-29 NOTE — Telephone Encounter (Signed)
Patient informed. 

## 2018-03-29 NOTE — Telephone Encounter (Signed)
-----   Message from Satira Sark, MD sent at 03/29/2018  1:26 PM EST ----- Results reviewed.  LVEF remains vigorous at 65 to 70%.  No change in current plan. A copy of this test should be forwarded to Chipper Herb, MD.

## 2018-03-30 ENCOUNTER — Ambulatory Visit (INDEPENDENT_AMBULATORY_CARE_PROVIDER_SITE_OTHER): Payer: Medicare Other | Admitting: Internal Medicine

## 2018-03-30 ENCOUNTER — Encounter (INDEPENDENT_AMBULATORY_CARE_PROVIDER_SITE_OTHER): Payer: Self-pay | Admitting: Internal Medicine

## 2018-03-30 ENCOUNTER — Telehealth (INDEPENDENT_AMBULATORY_CARE_PROVIDER_SITE_OTHER): Payer: Self-pay | Admitting: *Deleted

## 2018-03-30 ENCOUNTER — Encounter (INDEPENDENT_AMBULATORY_CARE_PROVIDER_SITE_OTHER): Payer: Self-pay | Admitting: *Deleted

## 2018-03-30 VITALS — BP 130/90 | HR 67 | Resp 18 | Ht 63.0 in | Wt 208.8 lb

## 2018-03-30 DIAGNOSIS — R195 Other fecal abnormalities: Secondary | ICD-10-CM | POA: Diagnosis not present

## 2018-03-30 DIAGNOSIS — D508 Other iron deficiency anemias: Secondary | ICD-10-CM

## 2018-03-30 DIAGNOSIS — D649 Anemia, unspecified: Secondary | ICD-10-CM | POA: Insufficient documentation

## 2018-03-30 MED ORDER — PEG 3350-KCL-NA BICARB-NACL 420 G PO SOLR: 4000.0000 mL | Freq: Once | ORAL | 0 refills | Status: AC

## 2018-03-30 NOTE — Progress Notes (Signed)
   Subjective:    Patient ID: Jenna [name], female    DOB: [birth year], 82 y.o.   MRN: [id number]  HPI Referred by Dr. Laurance Flatten for anemia. Recent CBC 03/15/2018 showed Hemoglobin of 8.9, ferritin 12,  Iron 22, TIBC 378, UIBC 356, Iron saturation 22.  Hemoglobin in February of this year was 11.5.  Her last colonoscopy was in January of 2015 (constipaton, hematochezia).  5 mm polyp cold snare from splenic flexure. Small polyps ablated via cold biopsy from rectum. Both of  these polyps are submitted together. Normal rectal mucosa. Prominent hemorrhoids below the dentate line along with anal papillae. Biopsy: Tubular adenoma.  Stool samples has not been obtained. She says if she gets constipated, she may see some blood.  No change in her stools. Has a BM about x 1 a day. If she becomes constipated, she will try Prune Juice which opens her up. Her appetite is good. No weight loss. No GI complaints.   No GERD. No NSAIDs.   Hx of atrial fib and maintained on Xarelto. (Has been on Xarelto x 1 year). Hx of hypothyroid, levothyroxine, hypertension.   CBC    Component Value Date/Time   WBC 5.6 03/15/2018 1017   WBC 5.4 04/06/2017 0017   RBC 3.88 03/15/2018 1017   RBC 3.60 (L) 04/06/2017 0017   HGB 8.9 (LL) 03/15/2018 1017   HCT 29.2 (L) 03/15/2018 1017   PLT 267 03/15/2018 1017   MCV 75 (L) 03/15/2018 1017   MCH 22.9 (L) 03/15/2018 1017   MCH 36.1 (H) 04/06/2017 0017   MCHC 30.5 (L) 03/15/2018 1017   MCHC 34.0 04/06/2017 0017   RDW 13.9 03/15/2018 1017   LYMPHSABS 1.2 03/15/2018 1017   MONOABS 0.5 03/30/2016 0152   EOSABS 0.1 03/15/2018 1017   BASOSABS 0.0 03/15/2018 1017    CBC Latest Ref Rng & Units 03/15/2018 11/16/2017 07/14/2017  WBC 3.4 - 10.8 x10E3/uL 5.6 4.3 4.9  Hemoglobin 11.1 - 15.9 g/dL 8.9(LL) 9.5(L) 11.5  Hematocrit 34.0 - 46.6 % 29.2(L) 30.2(L) 33.7(L)  Platelets 150 - 450 x10E3/uL 267 182 181       Review of Systems     Objective:   Physical  Exam Blood pressure 130/90, pulse 67, resp. rate 18, height 5\' 3"  (1.6 m), weight 208 lb 12.8 oz (94.7 kg). Alert and oriented. Skin warm and dry. Oral mucosa is moist.   . Sclera anicteric, conjunctivae is pink. Thyroid not enlarged. No cervical lymphadenopathy. Lungs clear. Heart regular rate and rhythm.  Abdomen is soft. Bowel sounds are positive. No hepatomegaly. No abdominal masses felt. No tenderness.  No edema to lower extremities. Rectal: no masses. Guaiac positive.         Assessment & Plan:

## 2018-03-30 NOTE — Telephone Encounter (Signed)
Patient needs trilyte 

## 2018-03-30 NOTE — Telephone Encounter (Signed)
Patient is scheduled for colonoscopy possible endoscopy 04/24/18 -- she need to stop Xarelto 2 days prior --please advise if ok to stop, thanks

## 2018-03-30 NOTE — Patient Instructions (Signed)
The risks of bleeding, perforation and infection were reviewed with patient.  

## 2018-03-31 NOTE — Telephone Encounter (Signed)
Patient aware.

## 2018-03-31 NOTE — Telephone Encounter (Signed)
Yes, she may hold Xarelto as requested prior to endoscopy.

## 2018-04-04 ENCOUNTER — Ambulatory Visit (HOSPITAL_COMMUNITY): Payer: Medicare Other | Admitting: Internal Medicine

## 2018-04-06 ENCOUNTER — Encounter (HOSPITAL_COMMUNITY): Payer: Self-pay | Admitting: Internal Medicine

## 2018-04-06 ENCOUNTER — Inpatient Hospital Stay (HOSPITAL_COMMUNITY): Payer: Medicare Other | Attending: Internal Medicine | Admitting: Internal Medicine

## 2018-04-06 VITALS — BP 162/43 | HR 70 | Temp 98.0°F | Resp 18 | Wt 205.2 lb

## 2018-04-06 DIAGNOSIS — I4891 Unspecified atrial fibrillation: Secondary | ICD-10-CM | POA: Diagnosis not present

## 2018-04-06 DIAGNOSIS — K5900 Constipation, unspecified: Secondary | ICD-10-CM | POA: Diagnosis not present

## 2018-04-06 DIAGNOSIS — Z7901 Long term (current) use of anticoagulants: Secondary | ICD-10-CM | POA: Diagnosis not present

## 2018-04-06 DIAGNOSIS — D509 Iron deficiency anemia, unspecified: Secondary | ICD-10-CM | POA: Insufficient documentation

## 2018-04-06 DIAGNOSIS — D508 Other iron deficiency anemias: Secondary | ICD-10-CM

## 2018-04-06 DIAGNOSIS — Z79899 Other long term (current) drug therapy: Secondary | ICD-10-CM | POA: Diagnosis not present

## 2018-04-06 DIAGNOSIS — D500 Iron deficiency anemia secondary to blood loss (chronic): Secondary | ICD-10-CM | POA: Insufficient documentation

## 2018-04-06 HISTORY — DX: Iron deficiency anemia secondary to blood loss (chronic): D50.0

## 2018-04-06 NOTE — Progress Notes (Signed)
Referring physician:  [place].    Diagnosis Other iron deficiency anemia - Plan: CBC with Differential/Platelet, Comprehensive metabolic panel, Lactate dehydrogenase, Protein electrophoresis, serum, Ferritin, Methylmalonic acid, serum, Vitamin B12, Folate  Iron deficiency anemia due to chronic blood loss  Staging Cancer Staging No matching staging information was found for the patient.  Assessment and Plan:  1.  Iron deficiency anemia  IDA).  82 year old female referred for evaluation due to anemia.  She reports she was transfused after a pregnancy due to blood loss.  She is scheduled for GI evaluation with colonoscopy.  Pt had labs done 03/15/2018 that were reviewed and showed WBC 5.6 HB 8.9 plts 267,000.  Chemistries WNL with K+ 4.7 Cr 0.66 and normal LFTs.  Ferritin is decreased at 12.  B12 WNL.  Pt has an intolerance to oral iron.  Pt is seen today for consultation due to  iron deficiency anemia.    Pt is recommended for IV iron with Feraheme 510 mg IV D1 and D8.  She should follow-up with GI as recommended due to association of IDA with GI blood loss.  Pt will RTC in 4 weeks for follow-up after IV iron with repeat labs ordered.    2.  Heme + stools. Pt is scheduled for GI evaluation.   3.  HTN.  BP is 162/43.  Follow-up with PCP.   4  Atrial Fibrillation.  Pt is on Xarelto.  Follow-up with cardiology as recommended.    5  Constipation.  Stool softeners prn.  Pt is scheduled for GI evaluation.    6  Health maintenance.  Pt is recommended for GI evaluation due to heme + stool and IDA.    40 minutes spent with more than 50% spent in counseling and coordination of care.    HPI:  82 year old female referred for evaluation due to anemia.  She reports she was transfused after a pregnancy due to blood loss.  She is scheduled for GI evaluation with colonoscopy.  Pt had labs done 03/15/2018 that were reviewed and showed WBC 5.6 HB 8.9 plts 267,000.  Chemistries WNL  with K+ 4.7 Cr 0.66 and normal LFTs.  Ferritin is decreased at 12.  B12 WNL.  Pt has an intolerance to oral iron.  Pt is seen today for consultation due to  iron deficiency anemia.    Problem List Patient Active Problem List   Diagnosis Date Noted  . Iron deficiency anemia due to chronic blood loss [D50.0] 04/06/2018  . Guaiac positive stools [R19.5] 03/30/2018  . Absolute anemia [D64.9] 03/30/2018  . OSA on CPAP [G47.33, Z99.89] 01/16/2018  . B12 deficiency anemia [D51.9] 07/15/2017  . Right knee pain [M25.561] 01/30/2015  . Hypertension [I10]   . Precordial pain [R07.2] 02/05/2014  . Palpitations [R00.2] 02/05/2014  . History of shingles [Z86.19] 01/11/2014  . Pre-diabetes [R73.03] 11/21/2013  . Obesity (BMI 30.0-34.9) [E66.9] 11/21/2013  . Mixed hypercholesterolemia and hypertriglyceridemia [E78.2] 11/21/2013  . Metabolic syndrome [Y69.48] 11/21/2013  . Multiple drug allergies [Z88.9] 10/23/2013  . Statin intolerance [Z78.9] 10/23/2013  . Osteopenia of the elderly [M85.80] 07/25/2013  . Vitamin D deficiency [E55.9] 06/19/2013  . Unspecified constipation [K59.00] 04/23/2013  . Rectal bleeding [K62.5] 04/23/2013  . Gout [M10.9] 02/21/2013  . Osteoarthritis [M19.90] 02/21/2013  . Fibromyalgia [M79.7]   . Hyperlipidemia [E78.5] 11/14/2008    Past Medical History Past Medical History:  Diagnosis Date  . Cataract   . DDD (degenerative disc disease)   .  Fibromyalgia   . Herpes zoster   . History of skin cancer   . Hyperlipidemia   . Hypertension   . Hypothyroidism   . Iron deficiency anemia due to chronic blood loss 04/06/2018  . OA (osteoarthritis)   . OSA on CPAP 01/16/2018   Mild OSA overall with an AHI of 5.8/h but during REM sleep moderate with an AHI of 28/h.  Oxygen saturations dropped to 76% during respiratory events.  Now on CPAP at 9 cm H2O   . Osteoporosis   . Palpitations   . Pre-diabetes   . PUD (peptic ulcer disease)   . Skin cancer of nose    Jenna [name]     Past Surgical History Past Surgical History:  Procedure Laterality Date  . ABDOMINAL HYSTERECTOMY     partial  . BREAST BIOPSY     Right  . CATARACT EXTRACTION W/PHACO  10/28/2011   Procedure: CATARACT EXTRACTION PHACO AND INTRAOCULAR LENS PLACEMENT (IOC);  Surgeon: Tonny Branch, MD;  Location: [facility];  Service: Ophthalmology;  Laterality: Right;  CDE 18.38  . CATARACT EXTRACTION W/PHACO  02/14/2012   Procedure: CATARACT EXTRACTION PHACO AND INTRAOCULAR LENS PLACEMENT (IOC);  Surgeon: Tonny Branch, MD;  Location: [facility];  Service: Ophthalmology;  Laterality: Left;  CDE 17.20  . CESAREAN SECTION    . COLONOSCOPY N/A 05/31/2013   Procedure: COLONOSCOPY;  Surgeon: Rogene Houston, MD;  Location: [facility];  Service: Endoscopy;  Laterality: N/A;  240  . EYE SURGERY     skin removal   . FRACTURE SURGERY  1952   fractured right arm    Family History Family History  Problem Relation Age of Onset  . CVA Mother   . Stroke Mother   . Osteoporosis Mother   . Cancer Father        prostate  . Bronchitis Sister   . COPD Sister   . Early death Brother        died in [place]  . Carpal tunnel syndrome Sister   . Cancer Brother        throat  . Cancer Brother        lung  . Cancer Brother        throat  . Stroke Brother   . Heart attack Brother   . Gout Brother   . Heart Problems Brother        stents  . CAD Brother   . Hypertension Brother   . Gout Brother   . Arthritis Brother   . Diabetes Son   . Coronary artery disease Neg Hx        No premature  . Anesthesia problems Neg Hx   . Hypotension Neg Hx   . Malignant hyperthermia Neg Hx   . Pseudochol deficiency Neg Hx      Social History  reports that she has never smoked. She has never used smokeless tobacco. She reports that she does not drink alcohol or use drugs.  Medications  Current Outpatient Medications:  .  Cholecalciferol (VITAMIN D3) 2000 UNITS TABS, Take 1 tablet by mouth daily.  , Disp: , Rfl:  .  diltiazem  (CARDIZEM) 30 MG tablet, Take 1 tablet every 4 hours AS NEEDED for AFIB heart rate over 100, Disp: 45 tablet, Rfl: 1 .  furosemide (LASIX) 40 MG tablet, Take 1 tablet (40 mg total) by mouth daily as needed., Disp: 30 tablet, Rfl: 3 .  hydrocortisone (ANUSOL-HC) 2.5 % rectal cream, APPLY RECTALLY DAILY AS  NEEDED FOR HEMMORHOIDS OR ITCHING., Disp: 30 g, Rfl: 0 .  levothyroxine (SYNTHROID, LEVOTHROID) 75 MCG tablet, TAKE 1 TABLET ONCE DAILY., Disp: 30 tablet, Rfl: 8 .  levothyroxine (SYNTHROID, LEVOTHROID) 75 MCG tablet, levothyroxine 75 mcg tablet, Disp: , Rfl:  .  metoprolol succinate (TOPROL-XL) 50 MG 24 hr tablet, TAKE 1 TABLET DAILY, TAKE WITH OR IMMEDIATELY FOLLOWING A MEAL, Disp: 30 tablet, Rfl: 0 .  metoprolol succinate (TOPROL-XL) 50 MG 24 hr tablet, metoprolol succinate ER 50 mg tablet,extended release 24 hr, Disp: , Rfl:  .  Misc. Devices MISC, by Does not apply route. CPAP, Disp: , Rfl:  .  PEG 3350-KCl-NaBcb-NaCl-NaSulf (PEG 3350/ELECTROLYTES) 240 g SOLR, , Disp: , Rfl:  .  rivaroxaban (XARELTO) 20 MG TABS tablet, Xarelto 20 mg tablet, Disp: , Rfl:  .  XARELTO 20 MG TABS tablet, TAKE 1 TABLET EVERYDAY WITH SUPPER., Disp: 30 tablet, Rfl: 2  Current Facility-Administered Medications:  .  cyanocobalamin ((VITAMIN B-12)) injection 1,000 mcg, 1,000 mcg, Intramuscular, Q30 days, Chipper Herb, MD, 1,000 mcg at 03/15/18 1438  Allergies Daypro [oxaprozin]; Diclofenac sodium; Effexor [venlafaxine hydrochloride]; Penicillins; Prozac [fluoxetine hcl]; Sulfa antibiotics; Amitriptyline; Cymbalta [duloxetine hcl]; Lipitor [atorvastatin calcium]; Milnacipran hcl; Savella [milnacipran hcl]; and Zocor [simvastatin]  Review of Systems Review of Systems - Oncology ROS negative other than constipation.     Physical Exam  Vitals Wt Readings from Last 3 Encounters:  04/06/18 205 lb 3.2 oz (93.1 kg)  03/30/18 208 lb 12.8 oz (94.7 kg)  03/15/18 207 lb (93.9 kg)   Temp Readings from Last 3  Encounters:  04/06/18 98 F (36.7 C) (Oral)  03/15/18 98 F (36.7 C) (Oral)  03/02/18 99.1 F (37.3 C) (Oral)   BP Readings from Last 3 Encounters:  04/06/18 (!) 162/43  03/30/18 130/90  03/15/18 (!) 124/57   Pulse Readings from Last 3 Encounters:  04/06/18 70  03/30/18 67  03/15/18 68   Constitutional: Well-developed, well-nourished, and in no distress.   HENT: Head: Normocephalic and atraumatic.  Mouth/Throat: No oropharyngeal exudate. Mucosa moist. Eyes: Pupils are equal, round, and reactive to light. Conjunctivae are normal. No scleral icterus.  Neck: Normal range of motion. Neck supple. No JVD present.  Cardiovascular: Normal rate, regular rhythm and normal heart sounds.  Exam reveals no gallop and no friction rub.   No murmur heard. Pulmonary/Chest: Effort normal and breath sounds normal. No respiratory distress. No wheezes.No rales.  Abdominal: Soft. Bowel sounds are normal. No distension. There is no tenderness. There is no guarding.  Musculoskeletal: No edema or tenderness.  Lymphadenopathy: No cervical, axillary or supraclavicular adenopathy.  Neurological: Alert and oriented to person, place, and time. No cranial nerve deficit.  Skin: Skin is warm and dry. No rash noted. No erythema. No pallor.  Psychiatric: Affect and judgment normal.   Labs No visits with results within 3 Day(s) from this visit.  Latest known visit with results is:  Office Visit on 03/15/2018  Component Date Value Ref Range Status  . Glucose 03/15/2018 115* 65 - 99 mg/dL Final  . BUN 03/15/2018 9  8 - 27 mg/dL Final  . Creatinine, Ser 03/15/2018 0.66  0.57 - 1.00 mg/dL Final  . GFR calc non Af Amer 03/15/2018 84  >59 mL/min/1.73 Final  . GFR calc Af Amer 03/15/2018 96  >59 mL/min/1.73 Final  . BUN/Creatinine Ratio 03/15/2018 14  12 - 28 Final  . Sodium 03/15/2018 138  134 - 144 mmol/L Final  . Potassium 03/15/2018 4.7  3.5 -  5.2 mmol/L Final  . Chloride 03/15/2018 99  96 - 106 mmol/L Final   . CO2 03/15/2018 24  20 - 29 mmol/L Final  . Calcium 03/15/2018 9.0  8.7 - 10.3 mg/dL Final  . WBC 03/15/2018 5.6  3.4 - 10.8 x10E3/uL Final  . RBC 03/15/2018 3.88  3.77 - 5.28 x10E6/uL Final  . Hemoglobin 03/15/2018 8.9* 11.1 - 15.9 g/dL Final                     Client Requested Flag  . Hematocrit 03/15/2018 29.2* 34.0 - 46.6 % Final  . MCV 03/15/2018 75* 79 - 97 fL Final  . MCH 03/15/2018 22.9* 26.6 - 33.0 pg Final  . MCHC 03/15/2018 30.5* 31.5 - 35.7 g/dL Final  . RDW 03/15/2018 13.9  12.3 - 15.4 % Final  . Platelets 03/15/2018 267  150 - 450 x10E3/uL Final  . Neutrophils 03/15/2018 66  Not Estab. % Final  . Lymphs 03/15/2018 22  Not Estab. % Final  . Monocytes 03/15/2018 9  Not Estab. % Final  . Eos 03/15/2018 2  Not Estab. % Final  . Basos 03/15/2018 1  Not Estab. % Final  . Neutrophils Absolute 03/15/2018 3.7  1.4 - 7.0 x10E3/uL Final  . Lymphocytes Absolute 03/15/2018 1.2  0.7 - 3.1 x10E3/uL Final  . Monocytes Absolute 03/15/2018 0.5  0.1 - 0.9 x10E3/uL Final  . EOS (ABSOLUTE) 03/15/2018 0.1  0.0 - 0.4 x10E3/uL Final  . Basophils Absolute 03/15/2018 0.0  0.0 - 0.2 x10E3/uL Final  . Immature Granulocytes 03/15/2018 0  Not Estab. % Final  . Immature Grans (Abs) 03/15/2018 0.0  0.0 - 0.1 x10E3/uL Final  . Cholesterol, Total 03/15/2018 204* 100 - 199 mg/dL Final  . Triglycerides 03/15/2018 169* 0 - 149 mg/dL Final  . HDL 03/15/2018 49  >39 mg/dL Final  . VLDL Cholesterol Cal 03/15/2018 34  5 - 40 mg/dL Final  . LDL Calculated 03/15/2018 121* 0 - 99 mg/dL Final  . Chol/HDL Ratio 03/15/2018 4.2  0.0 - 4.4 ratio Final   Comment:                                   T. Chol/HDL Ratio                                             Men  Women                               1/2 Avg.Risk  3.4    3.3                                   Avg.Risk  5.0    4.4                                2X Avg.Risk  9.6    7.1                                3X Avg.Risk 23.4   11.0   . Vit  D, 25-Hydroxy  03/15/2018 37.7  30.0 - 100.0 ng/mL Final   Comment: Vitamin D deficiency has been defined by the [place] practice guideline as a level of serum 25-OH vitamin D less than 20 ng/mL (1,2). The Endocrine Society went on to further define vitamin D insufficiency as a level between 21 and 29 ng/mL (2). 1. IOM (Institute of Medicine). 2010. Dietary reference    intakes for calcium and D. [place]: The    [employer]. 2. Holick MF, Binkley [place], Bischoff-Ferrari HA, et al.    Evaluation, treatment, and prevention of vitamin D    deficiency: an Endocrine Society clinical practice    guideline. JCEM. 2011 Jul; 96(7):1911-30.   [name] Total Protein 03/15/2018 6.1  6.0 - 8.5 g/dL Final  . Albumin 03/15/2018 4.1  3.5 - 4.7 g/dL Final  . Bilirubin Total 03/15/2018 0.6  0.0 - 1.2 mg/dL Final  . Bilirubin, Direct 03/15/2018 0.14  0.00 - 0.40 mg/dL Final  . Alkaline Phosphatase 03/15/2018 109  39 - 117 IU/L Final  . AST 03/15/2018 21  0 - 40 IU/L Final  . ALT 03/15/2018 19  0 - 32 IU/L Final  . TSH 03/15/2018 3.160  0.450 - 4.500 uIU/mL Final  . T4, Total 03/15/2018 11.0  4.5 - 12.0 ug/dL Final  . T3 Uptake Ratio 03/15/2018 27  24 - 39 % Final  . Free Thyroxine Index 03/15/2018 3.0  1.2 - 4.9 Final  . Vitamin B-12 03/15/2018 380  232 - 1,245 pg/mL Final  . Ferritin 03/15/2018 12* 15 - 150 ng/mL Final  . Total Iron Binding Capacity 03/15/2018 378  250 - 450 ug/dL Final  . UIBC 03/15/2018 356  118 - 369 ug/dL Final  . Iron 03/15/2018 22* 27 - 139 ug/dL Final  . Iron Saturation 03/15/2018 6* 15 - 55 % Final     Pathology Orders Placed This Encounter  Procedures  . CBC with Differential/Platelet    Standing Status:   Future    Standing Expiration Date:   04/07/2019  . Comprehensive metabolic panel    Standing Status:   Future    Standing Expiration Date:   04/07/2019  . Lactate dehydrogenase    Standing Status:   Future    Standing Expiration  Date:   04/07/2019  . Protein electrophoresis, serum    Standing Status:   Future    Standing Expiration Date:   04/07/2019  . Ferritin    Standing Status:   Future    Standing Expiration Date:   04/07/2019  . Methylmalonic acid, serum    Standing Status:   Future    Standing Expiration Date:   04/07/2019  . Vitamin B12    Standing Status:   Future    Standing Expiration Date:   04/07/2019  . Folate    Standing Status:   Future    Standing Expiration Date:   04/07/2019       Zoila Shutter MD

## 2018-04-12 ENCOUNTER — Inpatient Hospital Stay (HOSPITAL_COMMUNITY): Payer: Medicare Other

## 2018-04-12 ENCOUNTER — Encounter (HOSPITAL_COMMUNITY): Payer: Self-pay

## 2018-04-12 VITALS — BP 129/46 | HR 68 | Temp 98.0°F | Resp 18

## 2018-04-12 DIAGNOSIS — D509 Iron deficiency anemia, unspecified: Secondary | ICD-10-CM | POA: Diagnosis not present

## 2018-04-12 DIAGNOSIS — Z7901 Long term (current) use of anticoagulants: Secondary | ICD-10-CM | POA: Diagnosis not present

## 2018-04-12 DIAGNOSIS — I4891 Unspecified atrial fibrillation: Secondary | ICD-10-CM | POA: Diagnosis not present

## 2018-04-12 DIAGNOSIS — Z79899 Other long term (current) drug therapy: Secondary | ICD-10-CM | POA: Diagnosis not present

## 2018-04-12 DIAGNOSIS — K5900 Constipation, unspecified: Secondary | ICD-10-CM | POA: Diagnosis not present

## 2018-04-12 DIAGNOSIS — D500 Iron deficiency anemia secondary to blood loss (chronic): Secondary | ICD-10-CM

## 2018-04-12 MED ORDER — SODIUM CHLORIDE 0.9 % IV SOLN
Freq: Once | INTRAVENOUS | Status: AC
  Administered 2018-04-12: 15:00:00 via INTRAVENOUS

## 2018-04-12 MED ORDER — SODIUM CHLORIDE 0.9% FLUSH
10.0000 mL | Freq: Once | INTRAVENOUS | Status: AC | PRN
  Administered 2018-04-12: 10 mL

## 2018-04-12 MED ORDER — SODIUM CHLORIDE 0.9 % IV SOLN
510.0000 mg | Freq: Once | INTRAVENOUS | Status: AC
  Administered 2018-04-12: 510 mg via INTRAVENOUS
  Filled 2018-04-12: qty 17

## 2018-04-12 NOTE — Progress Notes (Signed)
Patient given written information for iron with all questions asked and answered.  Patient tolerated infusion with no complaints voiced.  Good blood return noted before and after infusion.  Band aid applied.  VSS with discharge and left ambulatory with no s/s of distress noted.

## 2018-04-12 NOTE — Patient Instructions (Signed)

## 2018-04-17 ENCOUNTER — Ambulatory Visit (INDEPENDENT_AMBULATORY_CARE_PROVIDER_SITE_OTHER): Payer: Medicare Other | Admitting: *Deleted

## 2018-04-17 DIAGNOSIS — D519 Vitamin B12 deficiency anemia, unspecified: Secondary | ICD-10-CM

## 2018-04-17 DIAGNOSIS — E538 Deficiency of other specified B group vitamins: Secondary | ICD-10-CM

## 2018-04-17 NOTE — Progress Notes (Signed)
Pt given Cyanocobalamin inj Tolerated well 

## 2018-04-19 ENCOUNTER — Encounter (HOSPITAL_COMMUNITY): Payer: Self-pay

## 2018-04-19 ENCOUNTER — Inpatient Hospital Stay (HOSPITAL_COMMUNITY): Payer: Medicare Other

## 2018-04-19 ENCOUNTER — Other Ambulatory Visit: Payer: Self-pay

## 2018-04-19 VITALS — BP 127/43 | HR 62 | Temp 98.0°F | Resp 18

## 2018-04-19 DIAGNOSIS — Z7901 Long term (current) use of anticoagulants: Secondary | ICD-10-CM | POA: Diagnosis not present

## 2018-04-19 DIAGNOSIS — Z79899 Other long term (current) drug therapy: Secondary | ICD-10-CM | POA: Diagnosis not present

## 2018-04-19 DIAGNOSIS — K5900 Constipation, unspecified: Secondary | ICD-10-CM | POA: Diagnosis not present

## 2018-04-19 DIAGNOSIS — D509 Iron deficiency anemia, unspecified: Secondary | ICD-10-CM | POA: Diagnosis not present

## 2018-04-19 DIAGNOSIS — I4891 Unspecified atrial fibrillation: Secondary | ICD-10-CM | POA: Diagnosis not present

## 2018-04-19 DIAGNOSIS — D500 Iron deficiency anemia secondary to blood loss (chronic): Secondary | ICD-10-CM

## 2018-04-19 MED ORDER — SODIUM CHLORIDE 0.9 % IV SOLN
510.0000 mg | Freq: Once | INTRAVENOUS | Status: AC
  Administered 2018-04-19: 510 mg via INTRAVENOUS
  Filled 2018-04-19: qty 17

## 2018-04-19 MED ORDER — SODIUM CHLORIDE 0.9 % IV SOLN
Freq: Once | INTRAVENOUS | Status: AC
  Administered 2018-04-19: 14:00:00 via INTRAVENOUS

## 2018-04-19 NOTE — Progress Notes (Signed)
Iron given per orders. Patient tolerated it well without problems. Vitals stable and discharged home from clinic ambulatory. Follow up as scheduled.  

## 2018-04-21 ENCOUNTER — Other Ambulatory Visit: Payer: Self-pay | Admitting: Family Medicine

## 2018-04-24 ENCOUNTER — Other Ambulatory Visit: Payer: Self-pay

## 2018-04-24 ENCOUNTER — Encounter (HOSPITAL_COMMUNITY): Payer: Self-pay | Admitting: *Deleted

## 2018-04-24 ENCOUNTER — Encounter (HOSPITAL_COMMUNITY)
Admission: RE | Disposition: A | Discharge status: Home / Self Care | Payer: Self-pay | Source: Ambulatory Visit | Attending: Internal Medicine

## 2018-04-24 ENCOUNTER — Ambulatory Visit (HOSPITAL_COMMUNITY)
Admission: RE | Admit: 2018-04-24 | Discharge: 2018-04-24 | Disposition: A | Discharge status: Home / Self Care | Payer: Medicare Other | Source: Ambulatory Visit | Attending: Internal Medicine | Admitting: Internal Medicine

## 2018-04-24 DIAGNOSIS — Z880 Allergy status to penicillin: Secondary | ICD-10-CM | POA: Diagnosis not present

## 2018-04-24 DIAGNOSIS — I4891 Unspecified atrial fibrillation: Secondary | ICD-10-CM | POA: Diagnosis not present

## 2018-04-24 DIAGNOSIS — Z7901 Long term (current) use of anticoagulants: Secondary | ICD-10-CM | POA: Insufficient documentation

## 2018-04-24 DIAGNOSIS — K317 Polyp of stomach and duodenum: Secondary | ICD-10-CM | POA: Diagnosis not present

## 2018-04-24 DIAGNOSIS — K6289 Other specified diseases of anus and rectum: Secondary | ICD-10-CM | POA: Diagnosis not present

## 2018-04-24 DIAGNOSIS — R7303 Prediabetes: Secondary | ICD-10-CM | POA: Insufficient documentation

## 2018-04-24 DIAGNOSIS — G4733 Obstructive sleep apnea (adult) (pediatric): Secondary | ICD-10-CM | POA: Diagnosis not present

## 2018-04-24 DIAGNOSIS — K921 Melena: Secondary | ICD-10-CM | POA: Insufficient documentation

## 2018-04-24 DIAGNOSIS — K228 Other specified diseases of esophagus: Secondary | ICD-10-CM | POA: Diagnosis not present

## 2018-04-24 DIAGNOSIS — K5730 Diverticulosis of large intestine without perforation or abscess without bleeding: Secondary | ICD-10-CM | POA: Diagnosis not present

## 2018-04-24 DIAGNOSIS — Z8711 Personal history of peptic ulcer disease: Secondary | ICD-10-CM | POA: Diagnosis not present

## 2018-04-24 DIAGNOSIS — Z882 Allergy status to sulfonamides status: Secondary | ICD-10-CM | POA: Diagnosis not present

## 2018-04-24 DIAGNOSIS — K644 Residual hemorrhoidal skin tags: Secondary | ICD-10-CM | POA: Diagnosis not present

## 2018-04-24 DIAGNOSIS — D508 Other iron deficiency anemias: Secondary | ICD-10-CM

## 2018-04-24 DIAGNOSIS — M797 Fibromyalgia: Secondary | ICD-10-CM | POA: Insufficient documentation

## 2018-04-24 DIAGNOSIS — D500 Iron deficiency anemia secondary to blood loss (chronic): Secondary | ICD-10-CM | POA: Insufficient documentation

## 2018-04-24 DIAGNOSIS — I10 Essential (primary) hypertension: Secondary | ICD-10-CM | POA: Diagnosis not present

## 2018-04-24 DIAGNOSIS — E785 Hyperlipidemia, unspecified: Secondary | ICD-10-CM | POA: Insufficient documentation

## 2018-04-24 DIAGNOSIS — E039 Hypothyroidism, unspecified: Secondary | ICD-10-CM | POA: Insufficient documentation

## 2018-04-24 DIAGNOSIS — R195 Other fecal abnormalities: Secondary | ICD-10-CM | POA: Insufficient documentation

## 2018-04-24 DIAGNOSIS — M1990 Unspecified osteoarthritis, unspecified site: Secondary | ICD-10-CM | POA: Diagnosis not present

## 2018-04-24 DIAGNOSIS — K3189 Other diseases of stomach and duodenum: Secondary | ICD-10-CM | POA: Diagnosis not present

## 2018-04-24 DIAGNOSIS — D649 Anemia, unspecified: Secondary | ICD-10-CM | POA: Insufficient documentation

## 2018-04-24 HISTORY — PX: ESOPHAGOGASTRODUODENOSCOPY: SHX5428

## 2018-04-24 HISTORY — PX: BIOPSY: SHX5522

## 2018-04-24 HISTORY — PX: COLONOSCOPY: SHX5424

## 2018-04-24 LAB — HEMOGLOBIN AND HEMATOCRIT, BLOOD
HCT: 31.6 % — ABNORMAL LOW (ref 36.0–46.0)
Hemoglobin: 9.3 g/dL — ABNORMAL LOW (ref 12.0–15.0)

## 2018-04-24 SURGERY — COLONOSCOPY
Anesthesia: Moderate Sedation

## 2018-04-24 MED ORDER — SODIUM CHLORIDE 0.9 % IV SOLN
INTRAVENOUS | Status: DC
  Administered 2018-04-24: 08:00:00 via INTRAVENOUS

## 2018-04-24 MED ORDER — LIDOCAINE VISCOUS HCL 2 % MT SOLN
OROMUCOSAL | Status: AC
  Filled 2018-04-24: qty 15

## 2018-04-24 MED ORDER — MEPERIDINE HCL 50 MG/ML IJ SOLN
INTRAMUSCULAR | Status: AC
  Filled 2018-04-24: qty 1

## 2018-04-24 MED ORDER — MEPERIDINE HCL 50 MG/ML IJ SOLN
INTRAMUSCULAR | Status: DC | PRN
  Administered 2018-04-24 (×2): 20 mg via INTRAVENOUS
  Administered 2018-04-24: 10 mg via INTRAVENOUS

## 2018-04-24 MED ORDER — STERILE WATER FOR IRRIGATION IR SOLN
Status: DC | PRN
  Administered 2018-04-24: 1.5 mL

## 2018-04-24 MED ORDER — PANTOPRAZOLE SODIUM 40 MG PO TBEC: 40.0000 mg | DELAYED_RELEASE_TABLET | Freq: Every day | ORAL | 5 refills | Status: DC

## 2018-04-24 MED ORDER — LIDOCAINE VISCOUS HCL 2 % MT SOLN
OROMUCOSAL | Status: DC | PRN
  Administered 2018-04-24: 1 via OROMUCOSAL

## 2018-04-24 MED ORDER — MIDAZOLAM HCL 5 MG/5ML IJ SOLN
INTRAMUSCULAR | Status: AC
  Filled 2018-04-24: qty 10

## 2018-04-24 MED ORDER — MIDAZOLAM HCL 5 MG/5ML IJ SOLN
INTRAMUSCULAR | Status: DC | PRN
  Administered 2018-04-24: 1 mg via INTRAVENOUS
  Administered 2018-04-24: 2 mg via INTRAVENOUS
  Administered 2018-04-24 (×3): 1 mg via INTRAVENOUS

## 2018-04-24 NOTE — Discharge Instructions (Signed)
Gastric Polyps A gastric polyp, also called a stomach polyp, is a growth on the lining of the stomach. Most polyps are not dangerous, but some can be harmful because of their size, location, or type. Polyps that can become harmful include:  Large polyps. These can turn into sores (ulcers). Ulcers can lead to stomach bleeding.  Polyps that block food from moving from the stomach to the small intestine (gastric outlet obstruction).  A type of polyp called an adenoma. This type of polyp can become cancerous.  What are the causes? Gastric polyps form when the lining of the stomach gets inflamed or damaged. Stomach inflammation and damage may be caused by:  A long-lasting stomach condition, such as gastritis.  Certain medicines used to reduce stomach acid.  An inherited condition called familial adenomatous polyposis.  What are the signs or symptoms? Usually, this condition does not cause any symptoms. If you do have symptoms, they may include:  Pain or tenderness in the abdomen.  Nausea.  Trouble eating or swallowing.  Blood in the stool.  Anemia.  How is this diagnosed? Gastric polyps are diagnosed with:  A medical procedure called endoscopy.  A lab test in which a part of the polyp is examined. This test is done with a sample of polyp tissue (biopsy) taken during an endoscopy.  How is this treated? Treatment depends on the type, location, and size of the polyps. Treatment may involve:  Having the polyps checked regularly with an endoscopy.  Having the polyps removed with an endoscopy. This may be done if the polyps are harmful or can become harmful. Removing a polyp often prevents problems from developing.  Having the polyps removed with a surgery called a partial gastrectomy. This may be done in rare cases to remove very large polyps.  Treating the underlying condition that caused the polyps.  Follow these instructions at home:  Take over-the-counter and prescription  medicines only as told by your health care provider.  Keep all follow-up visits as told by your health care provider. This is important. Contact a health care provider if:  You develop new symptoms.  Your symptoms get worse. Get help right away if:  You vomit blood.  You have severe abdominal pain.  You cannot eat or drink.  You have blood in your stool. This information is not intended to replace advice given to you by your health care provider. Make sure you discuss any questions you have with your health care provider. Document Released: 04/26/2012 Document Revised: 09/29/2015 Document Reviewed: 05/25/2015 Elsevier Interactive Patient Education  2018 [employer]. Esophagogastroduodenoscopy Esophagogastroduodenoscopy (EGD) is a procedure to examine the lining of the esophagus, stomach, and first part of the small intestine (duodenum). This procedure is done to check for problems such as inflammation, bleeding, ulcers, or growths. During this procedure, a long, flexible, lighted tube with a camera attached (endoscope) is inserted down the throat. Tell a health care provider about:  Any allergies you have.  All medicines you are taking, including vitamins, herbs, eye drops, creams, and over-the-counter medicines.  Any problems you or family members have had with anesthetic medicines.  Any blood disorders you have.  Any surgeries you have had.  Any medical conditions you have.  Whether you are pregnant or may be pregnant. What are the risks? Generally, this is a safe procedure. However, problems may occur, including:  Infection.  Bleeding.  A tear (perforation) in the esophagus, stomach, or duodenum.  Trouble breathing.  Excessive sweating.  Spasms  of the larynx.  A slowed heartbeat.  Low blood pressure.  What happens before the procedure?  Follow instructions from your health care provider about eating or drinking restrictions.  Ask your health care  provider about: ? Changing or stopping your regular medicines. This is especially important if you are taking diabetes medicines or blood thinners. ? Taking medicines such as aspirin and ibuprofen. These medicines can thin your blood. Do not take these medicines before your procedure if your health care provider instructs you not to.  Plan to have someone take you home after the procedure.  If you wear dentures, be ready to remove them before the procedure. What happens during the procedure?  To reduce your risk of infection, your health care team will wash or sanitize their hands.  An IV tube will be put in a vein in your hand or arm. You will get medicines and fluids through this tube.  You will be given one or more of the following: ? A medicine to help you relax (sedative). ? A medicine to numb the area (local anesthetic). This medicine may be sprayed into your throat. It will make you feel more comfortable and keep you from gagging or coughing during the procedure. ? A medicine for pain.  A mouth guard may be placed in your mouth to protect your teeth and to keep you from biting on the endoscope.  You will be asked to lie on your left side.  The endoscope will be lowered down your throat into your esophagus, stomach, and duodenum.  Air will be put into the endoscope. This will help your health care provider see better.  The lining of your esophagus, stomach, and duodenum will be examined.  Your health care provider may: ? Take a tissue sample so it can be looked at in a lab (biopsy). ? Remove growths. ? Remove objects (foreign bodies) that are stuck. ? Treat any bleeding with medicines or other devices that stop tissue from bleeding. ? Widen (dilate) or stretch narrowed areas of your esophagus and stomach.  The endoscope will be taken out. The procedure may vary among health care providers and hospitals. What happens after the procedure?  Your blood pressure, heart rate,  breathing rate, and blood oxygen level will be monitored often until the medicines you were given have worn off.  Do not eat or drink anything until the numbing medicine has worn off and your gag reflex has returned. This information is not intended to replace advice given to you by your health care provider. Make sure you discuss any questions you have with your health care provider. Document Released: 09/10/2004 Document Revised: 10/16/2015 Document Reviewed: 04/03/2015 Elsevier Interactive Patient Education  2018 [employer]. Hemorrhoids Hemorrhoids are swollen veins in and around the rectum or anus. There are two types of hemorrhoids:  Internal hemorrhoids. These occur in the veins that are just inside the rectum. They may poke through to the outside and become irritated and painful.  External hemorrhoids. These occur in the veins that are outside of the anus and can be felt as a painful swelling or hard lump near the anus.  Most hemorrhoids do not cause serious problems, and they can be managed with home treatments such as diet and lifestyle changes. If home treatments do not help your symptoms, procedures can be done to shrink or remove the hemorrhoids. What are the causes? This condition is caused by increased pressure in the anal area. This pressure may result from various  things, including:  Constipation.  Straining to have a bowel movement.  Diarrhea.  Pregnancy.  Obesity.  Sitting for long periods of time.  Heavy lifting or other activity that causes you to strain.  Anal sex.  What are the signs or symptoms? Symptoms of this condition include:  Pain.  Anal itching or irritation.  Rectal bleeding.  Leakage of stool (feces).  Anal swelling.  One or more lumps around the anus.  How is this diagnosed? This condition can often be diagnosed through a visual exam. Other exams or tests may also be done, such as:  Examination of the rectal area with a gloved  hand (digital rectal exam).  Examination of the anal canal using a small tube (anoscope).  A blood test, if you have lost a significant amount of blood.  A test to look inside the colon (sigmoidoscopy or colonoscopy).  How is this treated? This condition can usually be treated at home. However, various procedures may be done if dietary changes, lifestyle changes, and other home treatments do not help your symptoms. These procedures can help make the hemorrhoids smaller or remove them completely. Some of these procedures involve surgery, and others do not. Common procedures include:  Rubber band ligation. Rubber bands are placed at the base of the hemorrhoids to cut off the blood supply to them.  Sclerotherapy. Medicine is injected into the hemorrhoids to shrink them.  Infrared coagulation. A type of light energy is used to get rid of the hemorrhoids.  Hemorrhoidectomy surgery. The hemorrhoids are surgically removed, and the veins that supply them are tied off.  Stapled hemorrhoidopexy surgery. A circular stapling device is used to remove the hemorrhoids and use staples to cut off the blood supply to them.  Follow these instructions at home: Eating and drinking  Eat foods that have a lot of fiber in them, such as whole grains, beans, nuts, fruits, and vegetables. Ask your health care provider about taking products that have added fiber (fiber supplements).  Drink enough fluid to keep your urine clear or pale yellow. Managing pain and swelling  Take warm sitz baths for 20 minutes, 3-4 times a day to ease pain and discomfort.  If directed, apply ice to the affected area. Using ice packs between sitz baths may be helpful. ? Put ice in a plastic bag. ? Place a towel between your skin and the bag. ? Leave the ice on for 20 minutes, 2-3 times a day. General instructions  Take over-the-counter and prescription medicines only as told by your health care provider.  Use medicated creams  or suppositories as told.  Exercise regularly.  Go to the bathroom when you have the urge to have a bowel movement. Do not wait.  Avoid straining to have bowel movements.  Keep the anal area dry and clean. Use wet toilet paper or moist towelettes after a bowel movement.  Do not sit on the toilet for long periods of time. This increases blood pooling and pain. Contact a health care provider if:  You have increasing pain and swelling that are not controlled by treatment or medicine.  You have uncontrolled bleeding.  You have difficulty having a bowel movement, or you are unable to have a bowel movement.  You have pain or inflammation outside the area of the hemorrhoids. This information is not intended to replace advice given to you by your health care provider. Make sure you discuss any questions you have with your health care provider. Document Released: 05/07/2000 Document  Revised: 10/08/2015 Document Reviewed: 01/22/2015 Elsevier Interactive Patient Education  [employer]. Diverticulosis Diverticulosis is a condition that develops when small pouches (diverticula) form in the wall of the large intestine (colon). The colon is where water is absorbed and stool is formed. The pouches form when the inside layer of the colon pushes through weak spots in the outer layers of the colon. You may have a few pouches or many of them. What are the causes? The cause of this condition is not known. What increases the risk? The following factors may make you more likely to develop this condition:  Being older than age 59. Your risk for this condition increases with age. Diverticulosis is rare among people younger than age 57. By age 56, many people have it.  Eating a low-fiber diet.  Having frequent constipation.  Being overweight.  Not getting enough exercise.  Smoking.  Taking over-the-counter pain medicines, like aspirin and ibuprofen.  Having a family history of  diverticulosis.  What are the signs or symptoms? In most people, there are no symptoms of this condition. If you do have symptoms, they may include:  Bloating.  Cramps in the abdomen.  Constipation or diarrhea.  Pain in the lower left side of the abdomen.  How is this diagnosed? This condition is most often diagnosed during an exam for other colon problems. Because diverticulosis usually has no symptoms, it often cannot be diagnosed independently. This condition may be diagnosed by:  Using a flexible scope to examine the colon (colonoscopy).  Taking an X-ray of the colon after dye has been put into the colon (barium enema).  Doing a CT scan.  How is this treated? You may not need treatment for this condition if you have never developed an infection related to diverticulosis. If you have had an infection before, treatment may include:  Eating a high-fiber diet. This may include eating more fruits, vegetables, and grains.  Taking a fiber supplement.  Taking a live bacteria supplement (probiotic).  Taking medicine to relax your colon.  Taking antibiotic medicines.  Follow these instructions at home:  Drink 6-8 glasses of water or more each day to prevent constipation.  Try not to strain when you have a bowel movement.  If you have had an infection before: ? Eat more fiber as directed by your health care provider or your diet and nutrition specialist (dietitian). ? Take a fiber supplement or probiotic, if your health care provider approves.  Take over-the-counter and prescription medicines only as told by your health care provider.  If you were prescribed an antibiotic, take it as told by your health care provider. Do not stop taking the antibiotic even if you start to feel better.  Keep all follow-up visits as told by your health care provider. This is important. Contact a health care provider if:  You have pain in your abdomen.  You have bloating.  You have  cramps.  You have not had a bowel movement in 3 days. Get help right away if:  Your pain gets worse.  Your bloating becomes very bad.  You have a fever or chills, and your symptoms suddenly get worse.  You vomit.  You have bowel movements that are bloody or black.  You have bleeding from your rectum. Summary  Diverticulosis is a condition that develops when small pouches (diverticula) form in the wall of the large intestine (colon).  You may have a few pouches or many of them.  This condition is most often  diagnosed during an exam for other colon problems.  If you have had an infection related to diverticulosis, treatment may include increasing the fiber in your diet, taking supplements, or taking medicines. This information is not intended to replace advice given to you by your health care provider. Make sure you discuss any questions you have with your health care provider. Document Released: 02/05/2004 Document Revised: 03/29/2016 Document Reviewed: 03/29/2016 Elsevier Interactive Patient Education  2017 [employer]. Colonoscopy, Adult, Care After This sheet gives you information about how to care for yourself after your procedure. Your health care provider may also give you more specific instructions. If you have problems or questions, contact your health care provider. What can I expect after the procedure? After the procedure, it is common to have:  A small amount of blood in your stool for 24 hours after the procedure.  Some gas.  Mild abdominal cramping or bloating.  Follow these instructions at home: General instructions   For the first 24 hours after the procedure: ? Do not drive or use machinery. ? Do not sign important documents. ? Do not drink alcohol. ? Do your regular daily activities at a slower pace than normal. ? Eat soft, easy-to-digest foods. ? Rest often.  Take over-the-counter or prescription medicines only as told by your health care  provider.  It is up to you to get the results of your procedure. Ask your health care provider, or the department performing the procedure, when your results will be ready. Relieving cramping and bloating  Try walking around when you have cramps or feel bloated.  Apply heat to your abdomen as told by your health care provider. Use a heat source that your health care provider recommends, such as a moist heat pack or a heating pad. ? Place a towel between your skin and the heat source. ? Leave the heat on for 20-30 minutes. ? Remove the heat if your skin turns bright red. This is especially important if you are unable to feel pain, heat, or cold. You may have a greater risk of getting burned. Eating and drinking  Drink enough fluid to keep your urine clear or pale yellow.  Resume your normal diet as instructed by your health care provider. Avoid heavy or fried foods that are hard to digest.  Avoid drinking alcohol for as long as instructed by your health care provider. Contact a health care provider if:  You have blood in your stool 2-3 days after the procedure. Get help right away if:  You have more than a small spotting of blood in your stool.  You pass large blood clots in your stool.  Your abdomen is swollen.  You have nausea or vomiting.  You have a fever.  You have increasing abdominal pain that is not relieved with medicine. This information is not intended to replace advice given to you by your health care provider. Make sure you discuss any questions you have with your health care provider. Document Released: 12/23/2003 Document Revised: 02/02/2016 Document Reviewed: 07/22/2015 Elsevier Interactive Patient Education  2018 [employer] on 05/05/2018. Pantoprazole 40 mg by mouth 30 minutes before breakfast daily. Resume other medications as before. Resume usual diet. No driving for 24 hours. Physician will call with biopsy results.

## 2018-04-24 NOTE — Op Note (Signed)
[facility] Patient Name: Jenna [name] Procedure Date: 04/24/2018 8:36 AM MRN: [id number] Date of Birth: Nov 26, 1935 Attending MD: Hildred Laser , MD CSN: [id number] Age: 82 Admit Type: Outpatient Procedure:                Colonoscopy Indications:              Iron deficiency anemia secondary to chronic blood                            loss Providers:                Hildred Laser, MD, Charlsie Quest. Theda Sers RN, RN, Aram Candela Referring MD:             Chipper Herb, MD Medicines:                Meperidine 40 mg IV, Midazolam 4 mg IV Complications:            No immediate complications. Estimated Blood Loss:     Estimated blood loss: none. Procedure:                Pre-Anesthesia Assessment:                           - Prior to the procedure, a History and Physical                            was performed, and patient medications and                            allergies were reviewed. The patient's tolerance of                            previous anesthesia was also reviewed. The risks                            and benefits of the procedure and the sedation                            options and risks were discussed with the patient.                            All questions were answered, and informed consent                            was obtained. Prior Anticoagulants: The patient                            last took Xarelto (rivaroxaban) 3 days prior to the                            procedure. ASA Grade Assessment: III - A patient  with severe systemic disease. After reviewing the                            risks and benefits, the patient was deemed in                            satisfactory condition to undergo the procedure.                           After obtaining informed consent, the colonoscope                            was passed under direct vision. Throughout the                            procedure, the  patient's blood pressure, pulse, and                            oxygen saturations were monitored continuously. The                            [id number] ([id number]) was introduced through the                            anus and advanced to the the terminal ileum, with                            identification of the appendiceal orifice and IC                            valve. The colonoscopy was performed without                            difficulty. The patient tolerated the procedure                            well. The quality of the bowel preparation was                            good. The terminal ileum, ileocecal valve,                            appendiceal orifice, and rectum were photographed. Scope In: 9:03:48 AM Scope Out: 9:24:28 AM Scope Withdrawal Time: 0 hours 9 minutes 24 seconds  Total Procedure Duration: 0 hours 20 minutes 40 seconds  Findings:      The perianal and digital rectal examinations were normal.      The terminal ileum appeared normal.      A few diverticula were found in the sigmoid colon.      The exam was otherwise normal throughout the examined colon.      External hemorrhoids were found during retroflexion. The hemorrhoids       were small.      Anal papilla(e) were hypertrophied. Impression:               -  The examined portion of the ileum was normal.                           - Diverticulosis in the sigmoid colon.                           - External hemorrhoids.                           - Anal papilla(e) were hypertrophied.                           - No specimens collected. Moderate Sedation:      Moderate (conscious) sedation was administered by the endoscopy nurse       and supervised by the endoscopist. The following parameters were       monitored: oxygen saturation, heart rate, blood pressure, CO2       capnography and response to care. Total physician intraservice time was       26 minutes. Recommendation:           - Patient has a  contact number available for                            emergencies. The signs and symptoms of potential                            delayed complications were discussed with the                            patient. Return to normal activities tomorrow.                            Written discharge instructions were provided to the                            patient.                           - Resume previous diet today.                           - Continue present medications.                           - No repeat colonoscopy due to current age (65                            years or older) and the absence of advanced                            adenomas.                           - See the other procedure note for documentation of  additional recommendations. Procedure Code(s):        --- Professional ---                           [phone], Colonoscopy, flexible; diagnostic, including                            collection of specimen(s) by brushing or washing,                            when performed (separate procedure)                           99153, Moderate sedation; each additional 15                            minutes intraservice time                           G0500, Moderate sedation services provided by the                            same physician or other qualified health care                            professional performing a gastrointestinal                            endoscopic service that sedation supports,                            requiring the presence of an independent trained                            observer to assist in the monitoring of the                            patient's level of consciousness and physiological                            status; initial 15 minutes of intra-service time;                            patient age 39 years or older (additional time may                            be reported with [phone], as  appropriate) Diagnosis Code(s):        --- Professional ---                           K64.4, Residual hemorrhoidal skin tags                           K62.89, Other specified diseases of anus and rectum  D50.0, Iron deficiency anemia secondary to blood                            loss (chronic)                           K57.30, Diverticulosis of large intestine without                            perforation or abscess without bleeding CPT copyright 2018 [employer]. All rights reserved. The codes documented in this report are preliminary and upon coder review may  be revised to meet current compliance requirements. Hildred Laser, MD Hildred Laser, MD 04/24/2018 9:58:49 AM This report has been signed electronically. Number of Addenda: 0

## 2018-04-24 NOTE — H&P (Signed)
Jenna [name] is an 82 y.o. female.   Chief Complaint: Patient is here for colonoscopy followed by EGD if colonoscopy is normal. HPI: Patient is 82 year old Caucasian female who has a history of colonic adenomas and was last colonoscopy was December 2014 he was recently found to have iron deficiency anemia and heme positive stool.  She is on Xarelto for history of atrial fibrillation.  She does not take other NSAIDs.  She denies change in her bowel habits.  She has occasional hematochezia which she believes is due to hemorrhoids.  She has not experienced frank rectal bleeding or melena.  She also denies abdominal pain.  Her last colonoscopy she was found to have 2 small tubular adenomas.  She has been intolerant of p.o. iron.  She is receiving iron infusion via hematology/ oncology clinic. Last Xarelto dose was on 06/21/2017  Family history is negative for CRC.  Past Medical History:  Diagnosis Date  . Cataract   . DDD (degenerative disc disease)   . Fibromyalgia   . Herpes zoster   . History of skin cancer   . Hyperlipidemia   . Hypertension   . Hypothyroidism   . Iron deficiency anemia due to chronic blood loss 04/06/2018  . OA (osteoarthritis)   . OSA on CPAP 01/16/2018   Mild OSA overall with an AHI of 5.8/h but during REM sleep moderate with an AHI of 28/h.  Oxygen saturations dropped to 76% during respiratory events.  Now on CPAP at 9 cm H2O   . Osteoporosis   . Palpitations   . Pre-diabetes   . PUD (peptic ulcer disease)   . Skin cancer of nose    [name]    Past Surgical History:  Procedure Laterality Date  . ABDOMINAL HYSTERECTOMY     partial  . BREAST BIOPSY     Right  . CATARACT EXTRACTION W/PHACO  10/28/2011   Procedure: CATARACT EXTRACTION PHACO AND INTRAOCULAR LENS PLACEMENT (IOC);  Surgeon: Tonny Branch, MD;  Location: [facility];  Service: Ophthalmology;  Laterality: Right;  CDE 18.38  . CATARACT EXTRACTION W/PHACO  02/14/2012   Procedure: CATARACT EXTRACTION PHACO  AND INTRAOCULAR LENS PLACEMENT (IOC);  Surgeon: Tonny Branch, MD;  Location: [facility];  Service: Ophthalmology;  Laterality: Left;  CDE 17.20  . CESAREAN SECTION    . COLONOSCOPY N/A 05/31/2013   Procedure: COLONOSCOPY;  Surgeon: Rogene Houston, MD;  Location: [facility];  Service: Endoscopy;  Laterality: N/A;  240  . EYE SURGERY     skin removal   . FRACTURE SURGERY  1952   fractured right arm    Family History  Problem Relation Age of Onset  . CVA Mother   . Stroke Mother   . Osteoporosis Mother   . Cancer Father        prostate  . Bronchitis Sister   . COPD Sister   . Early death Brother        died in [place]  . Carpal tunnel syndrome Sister   . Cancer Brother        throat  . Cancer Brother        lung  . Cancer Brother        throat  . Stroke Brother   . Heart attack Brother   . Gout Brother   . Heart Problems Brother        stents  . CAD Brother   . Hypertension Brother   . Gout Brother   . Arthritis  Brother   . Diabetes Son   . Coronary artery disease Neg Hx        No premature  . Anesthesia problems Neg Hx   . Hypotension Neg Hx   . Malignant hyperthermia Neg Hx   . Pseudochol deficiency Neg Hx    Social History:  reports that she has never smoked. She has never used smokeless tobacco. She reports that she does not drink alcohol or use drugs.  Allergies:  Allergies  Allergen Reactions  . Daypro [Oxaprozin]     Unknown reaction  . Diclofenac Sodium     Unknown reaction  . Effexor [Venlafaxine Hydrochloride]     Unknown reaction  . Prozac [Fluoxetine Hcl]     Unknown reaction  . Amitriptyline Rash  . Cymbalta [Duloxetine Hcl] Nausea Only  . Lipitor [Atorvastatin Calcium] Other (See Comments)    Leg aches  . Penicillins Rash    Has patient had a PCN reaction causing immediate rash, facial/tongue/throat swelling, SOB or lightheadedness with hypotension: Yes Has patient had a PCN reaction causing severe rash involving mucus membranes or skin necrosis:  No Has patient had a PCN reaction that required hospitalization: No Has patient had a PCN reaction occurring within the last 10 years: No If all of the above answers are "NO", then may proceed with Cephalosporin use.   [name] [Milnacipran Hcl] Rash  . Sulfa Antibiotics Rash  . Zocor [Simvastatin] Other (See Comments)    Hip pain    Facility-Administered Medications Prior to Admission  Medication Dose Route Frequency Provider Last Rate Last Dose  . cyanocobalamin ((VITAMIN B-12)) injection 1,000 mcg  1,000 mcg Intramuscular Q30 days Chipper Herb, MD   1,000 mcg at 04/17/18 1441   Medications Prior to Admission  Medication Sig Dispense Refill  . acetaminophen (TYLENOL) 500 MG tablet Take 500 mg by mouth daily as needed for moderate pain or headache.    . Cholecalciferol (VITAMIN D3) 2000 UNITS TABS Take 2,000 Units by mouth daily.     . Cyanocobalamin (B-12 COMPLIANCE INJECTION IJ) Inject 1 Dose as directed every 30 (thirty) days.    [name] diltiazem (CARDIZEM) 30 MG tablet Take 1 tablet every 4 hours AS NEEDED for AFIB heart rate over 100 45 tablet 1  . furosemide (LASIX) 40 MG tablet Take 1 tablet (40 mg total) by mouth daily as needed. (Patient taking differently: Take 40 mg by mouth daily as needed for fluid or edema. ) 30 tablet 3  . hydrocortisone (ANUSOL-HC) 2.5 % rectal cream APPLY RECTALLY DAILY AS NEEDED FOR HEMMORHOIDS OR ITCHING. (Patient taking differently: Place 1 application rectally daily. ) 30 g 0  . levothyroxine (SYNTHROID, LEVOTHROID) 75 MCG tablet TAKE 1 TABLET ONCE DAILY. 30 tablet 10  . metoprolol succinate (TOPROL-XL) 50 MG 24 hr tablet TAKE 1 TABLET DAILY, TAKE WITH OR IMMEDIATELY FOLLOWING A MEAL (Patient taking differently: Take 50 mg by mouth daily. ) 30 tablet 0  . Misc. Devices MISC by Does not apply route. CPAP    . XARELTO 20 MG TABS tablet TAKE 1 TABLET EVERYDAY WITH SUPPER. (Patient taking differently: Take 20 mg by mouth at bedtime. ) 30 tablet 2  . PEG  3350-KCl-NaBcb-NaCl-NaSulf (PEG 3350/ELECTROLYTES) 240 g SOLR       No results found for this or any previous visit (from the past 48 hour(s)). No results found.  ROS  Blood pressure (!) 156/65, pulse 66, temperature 98 F (36.7 C), temperature source Oral, resp. rate 17, SpO2 100 %.  Physical Exam  Constitutional: She appears well-developed and well-nourished.  HENT:  Mouth/Throat: Oropharynx is clear and moist.  Eyes: Conjunctivae are normal. No scleral icterus.  Neck: No thyromegaly present.  Cardiovascular: Normal rate, regular rhythm and normal heart sounds.  No murmur heard. Respiratory: Effort normal and breath sounds normal.  GI:  Abdomen is full with lower midline scar.  On palpation is soft and nontender with organomegaly or masses.  Musculoskeletal: She exhibits no edema.  Lymphadenopathy:    She has no cervical adenopathy.  Neurological: She is alert.  Skin: Skin is warm and dry.     Assessment/Plan Iron deficiency anemia and heme positive stool. History of colonic adenomas. Diagnostic colonoscopy and possible EGD.  Hildred Laser, MD 04/24/2018, 8:51 AM

## 2018-04-24 NOTE — Op Note (Signed)
[facility] Patient Name: Jenna [name] Procedure Date: 04/24/2018 8:30 AM MRN: [id number] Date of Birth: 01/31/1936 Attending MD: Hildred Laser , MD CSN: [id number] Age: 82 Admit Type: Outpatient Procedure:                Upper GI endoscopy Indications:              Iron deficiency anemia secondary to chronic blood                            loss Providers:                Hildred Laser, MD, Charlsie Quest. Theda Sers RN, RN, Aram Candela Referring MD:             Chipper Herb, MD Medicines:                Lidocaine spray, Meperidine 10 mg IV, Midazolam 2                            mg IV Complications:            No immediate complications. Estimated Blood Loss:     Estimated blood loss was minimal. Procedure:                After obtaining informed consent, the endoscope was                            passed under direct vision. Throughout the                            procedure, the patient's blood pressure, pulse, and                            oxygen saturations were monitored continuously. The                            GIF-H190 ([id number]) scope was introduced through the                            mouth, and advanced to the second part of duodenum.                            The upper GI endoscopy was accomplished without                            difficulty. The patient tolerated the procedure                            well. Scope In: 9:32:10 AM Scope Out: 9:41:11 AM Total Procedure Duration: 0 hours 9 minutes 1 second  Findings:      The examined esophagus was normal.      The Z-line was irregular and was found 41 cm from the incisors.      A single 6 mm sessile polyp with surface ulceration but no  bleeding and       no stigmata of recent bleeding was found in the gastric body and on the       posterior wall of the stomach. Biopsies were taken with a cold forceps       for histology. For hemostasis, one hemostatic clip was successfully   placed (MR conditional). There was no bleeding at the end of the       procedure.      The exam of the stomach was otherwise normal.      The duodenal bulb and second portion of the duodenum were normal. Impression:               - Normal esophagus.                           - Z-line irregular, 41 cm from the incisors.                           - A single gastric polyp with ulcerated surface..                            Biopsied. Clip (MR conditional) was placed.                           - Normal duodenal bulb and second portion of the                            duodenum. Moderate Sedation:      Moderate (conscious) sedation was administered by the endoscopy nurse       and supervised by the endoscopist. The following parameters were       monitored: oxygen saturation, heart rate, blood pressure, CO2       capnography and response to care. Total physician intraservice time was       9 minutes. Recommendation:           - Patient has a contact number available for                            emergencies. The signs and symptoms of potential                            delayed complications were discussed with the                            patient. Return to normal activities tomorrow.                            Written discharge instructions were provided to the                            patient.                           - Resume previous diet today.                           - Continue present medications.                           -  Resume Xarelto (rivaroxaban) at prior dose                            tomorrow.                           - Use Protonix (pantoprazole) 40 mg PO daily today.                           - Await pathology results.                           - Check H/H today. Procedure Code(s):        --- Professional ---                           [phone], Esophagogastroduodenoscopy, flexible,                            transoral; with biopsy, single or multiple Diagnosis  Code(s):        --- Professional ---                           K22.8, Other specified diseases of esophagus                           K31.7, Polyp of stomach and duodenum                           D50.0, Iron deficiency anemia secondary to blood                            loss (chronic) CPT copyright 2018 [employer]. All rights reserved. The codes documented in this report are preliminary and upon coder review may  be revised to meet current compliance requirements. Hildred Laser, MD Hildred Laser, MD 04/24/2018 10:05:51 AM This report has been signed electronically. Number of Addenda: 0

## 2018-04-25 MED ORDER — LEVOTHYROXINE SODIUM 75 MCG PO TABS: 75.0000 ug | ORAL_TABLET | Freq: Every day | ORAL | 10 refills | Status: DC

## 2018-04-25 NOTE — Telephone Encounter (Signed)
04/21/18 thyroid refill failed. resent

## 2018-04-25 NOTE — Addendum Note (Signed)
Addended by: Antonietta Barcelona D on: 04/25/2018 08:45 AM   Modules accepted: Orders

## 2018-04-28 ENCOUNTER — Encounter (INDEPENDENT_AMBULATORY_CARE_PROVIDER_SITE_OTHER): Payer: Self-pay | Admitting: *Deleted

## 2018-04-28 ENCOUNTER — Other Ambulatory Visit (INDEPENDENT_AMBULATORY_CARE_PROVIDER_SITE_OTHER): Payer: Self-pay | Admitting: *Deleted

## 2018-04-28 DIAGNOSIS — R195 Other fecal abnormalities: Secondary | ICD-10-CM

## 2018-04-28 DIAGNOSIS — D509 Iron deficiency anemia, unspecified: Secondary | ICD-10-CM

## 2018-05-01 ENCOUNTER — Encounter (HOSPITAL_COMMUNITY): Payer: Self-pay | Admitting: Internal Medicine

## 2018-05-05 ENCOUNTER — Inpatient Hospital Stay (HOSPITAL_COMMUNITY): Payer: Medicare Other | Attending: Internal Medicine

## 2018-05-05 DIAGNOSIS — K921 Melena: Secondary | ICD-10-CM | POA: Insufficient documentation

## 2018-05-05 DIAGNOSIS — D508 Other iron deficiency anemias: Secondary | ICD-10-CM

## 2018-05-05 DIAGNOSIS — D500 Iron deficiency anemia secondary to blood loss (chronic): Secondary | ICD-10-CM | POA: Diagnosis not present

## 2018-05-05 LAB — CBC WITH DIFFERENTIAL/PLATELET
ABS IMMATURE GRANULOCYTES: 0.01 10*3/uL (ref 0.00–0.07)
Basophils Absolute: 0 10*3/uL (ref 0.0–0.1)
Basophils Relative: 1 %
Eosinophils Absolute: 0.1 10*3/uL (ref 0.0–0.5)
Eosinophils Relative: 2 %
HCT: 37 % (ref 36.0–46.0)
Hemoglobin: 11.4 g/dL — ABNORMAL LOW (ref 12.0–15.0)
IMMATURE GRANULOCYTES: 0 %
LYMPHS PCT: 16 %
Lymphs Abs: 0.9 10*3/uL (ref 0.7–4.0)
MCH: 26.6 pg (ref 26.0–34.0)
MCHC: 30.8 g/dL (ref 30.0–36.0)
MCV: 86.4 fL (ref 80.0–100.0)
Monocytes Absolute: 0.5 10*3/uL (ref 0.1–1.0)
Monocytes Relative: 10 %
NEUTROS PCT: 71 %
Neutro Abs: 3.9 10*3/uL (ref 1.7–7.7)
Platelets: 192 10*3/uL (ref 150–400)
RBC: 4.28 MIL/uL (ref 3.87–5.11)
RDW: 24 % — ABNORMAL HIGH (ref 11.5–15.5)
WBC: 5.4 10*3/uL (ref 4.0–10.5)
nRBC: 0 % (ref 0.0–0.2)

## 2018-05-05 LAB — COMPREHENSIVE METABOLIC PANEL
ALK PHOS: 82 U/L (ref 38–126)
ALT: 18 U/L (ref 0–44)
AST: 22 U/L (ref 15–41)
Albumin: 4 g/dL (ref 3.5–5.0)
Anion gap: 10 (ref 5–15)
BUN: 10 mg/dL (ref 8–23)
CALCIUM: 8.9 mg/dL (ref 8.9–10.3)
CO2: 26 mmol/L (ref 22–32)
Chloride: 99 mmol/L (ref 98–111)
Creatinine, Ser: 0.62 mg/dL (ref 0.44–1.00)
GFR calc Af Amer: >60 mL/min (ref >60)
GFR calc non Af Amer: >60 mL/min (ref >60)
Glucose, Bld: 106 mg/dL — ABNORMAL HIGH (ref 70–99)
Potassium: 4.1 mmol/L (ref 3.5–5.1)
Sodium: 135 mmol/L (ref 135–145)
Total Bilirubin: 0.8 mg/dL (ref 0.3–1.2)
Total Protein: 6.5 g/dL (ref 6.5–8.1)

## 2018-05-05 LAB — FOLATE: Folate: 21.1 ng/mL (ref >5.9)

## 2018-05-05 LAB — LACTATE DEHYDROGENASE: LDH: 141 U/L (ref 98–192)

## 2018-05-05 LAB — VITAMIN B12: Vitamin B-12: 361 pg/mL (ref 180–914)

## 2018-05-05 LAB — FERRITIN: Ferritin: 167 ng/mL (ref 11–307)

## 2018-05-08 LAB — PROTEIN ELECTROPHORESIS, SERUM
A/G Ratio: 1.6 (ref 0.7–1.7)
ALPHA-2-GLOBULIN: 0.6 g/dL (ref 0.4–1.0)
Albumin ELP: 3.7 g/dL (ref 2.9–4.4)
Alpha-1-Globulin: 0.2 g/dL (ref 0.0–0.4)
Beta Globulin: 0.9 g/dL (ref 0.7–1.3)
Gamma Globulin: 0.5 g/dL (ref 0.4–1.8)
Globulin, Total: 2.3 g/dL (ref 2.2–3.9)
Total Protein ELP: 6 g/dL (ref 6.0–8.5)

## 2018-05-10 ENCOUNTER — Encounter (HOSPITAL_COMMUNITY): Payer: Self-pay | Admitting: Internal Medicine

## 2018-05-10 ENCOUNTER — Inpatient Hospital Stay (HOSPITAL_BASED_OUTPATIENT_CLINIC_OR_DEPARTMENT_OTHER): Payer: Medicare Other | Admitting: Internal Medicine

## 2018-05-10 ENCOUNTER — Other Ambulatory Visit: Payer: Self-pay

## 2018-05-10 VITALS — BP 139/45 | HR 65 | Temp 98.3°F | Resp 16 | Wt 209.7 lb

## 2018-05-10 DIAGNOSIS — D500 Iron deficiency anemia secondary to blood loss (chronic): Secondary | ICD-10-CM

## 2018-05-10 DIAGNOSIS — K921 Melena: Secondary | ICD-10-CM

## 2018-05-10 NOTE — Progress Notes (Signed)
Diagnosis Iron deficiency anemia due to chronic blood loss - Plan: CBC with Differential/Platelet, Comprehensive metabolic panel, Lactate dehydrogenase, Ferritin  Staging Cancer Staging No matching staging information was found for the patient.  Assessment and Plan:   1.  Iron deficiency anemia  IDA).  82 year old female referred for evaluation due to anemia.  She reports she was transfused after a pregnancy due to blood loss.  She is scheduled for GI evaluation with colonoscopy.  Pt had labs done 03/15/2018 that were reviewed and showed WBC 5.6 HB 8.9 plts 267,000.  Chemistries WNL with K+ 4.7 Cr 0.66 and normal LFTs.  Ferritin is decreased at 12.  B12 WNL.  Pt has an intolerance to oral iron.    Pt was treated with IV iron on 04/19/2018.  Labs done 05/05/2018 reviewed and showed WBC 5.4 HB 11.4 plts 192,000.  Chemistries WNL with K+ 4.1 Cr 0.62 and normal LFTs.  Ferritin WNL at 167.  SPEP negative.  Iron levels have improved after IV iron.  Pt had EGD done 04/24/2018 that showed normal esophagus with gastric polyp that was negative for dysplasia or malignancy.  Pt should follow-up with GI as directed.    2.  Heme + stools. She denies any recurrent episodes.  She should follow-up with GI as directed.  3.  HTN.  BP is 139/45.  Follow-up with PCP.   4  Atrial Fibrillation.  Pt is on Xarelto.  Follow-up with cardiology as recommended.    5.  Health maintenance.  Follow-up with GI as recommended.    Interval history:  Historical data obtained from note dated 04/06/2018.  82 year old female referred for evaluation due to anemia.  She reports she was transfused after a pregnancy due to blood loss.  Labs done 03/15/2018 that were reviewed and showed WBC 5.6 HB 8.9 plts 267,000.  Chemistries WNL with K+ 4.7 Cr 0.66 and normal LFTs.  Ferritin is decreased at 12.  B12 WNL.  Pt has an intolerance to oral iron.    Current Status:  Pt is seen today for follow-up.  She is here to go over labs after IV  iron.     Problem List Patient Active Problem List   Diagnosis Date Noted  . Iron deficiency anemia due to chronic blood loss [D50.0] 04/06/2018  . Guaiac positive stools [R19.5] 03/30/2018  . Absolute anemia [D64.9] 03/30/2018  . OSA on CPAP [G47.33, Z99.89] 01/16/2018  . B12 deficiency anemia [D51.9] 07/15/2017  . Right knee pain [M25.561] 01/30/2015  . Hypertension [I10]   . Precordial pain [R07.2] 02/05/2014  . Palpitations [R00.2] 02/05/2014  . History of shingles [Z86.19] 01/11/2014  . Pre-diabetes [R73.03] 11/21/2013  . Obesity (BMI 30.0-34.9) [E66.9] 11/21/2013  . Mixed hypercholesterolemia and hypertriglyceridemia [E78.2] 11/21/2013  . Metabolic syndrome [Y50.35] 11/21/2013  . Multiple drug allergies [Z88.9] 10/23/2013  . Statin intolerance [Z78.9] 10/23/2013  . Osteopenia of the elderly [M85.80] 07/25/2013  . Vitamin D deficiency [E55.9] 06/19/2013  . Unspecified constipation [K59.00] 04/23/2013  . Rectal bleeding [K62.5] 04/23/2013  . Gout [M10.9] 02/21/2013  . Osteoarthritis [M19.90] 02/21/2013  . Fibromyalgia [M79.7]   . Hyperlipidemia [E78.5] 11/14/2008    Past Medical History Past Medical History:  Diagnosis Date  . Cataract   . DDD (degenerative disc disease)   . Fibromyalgia   . Herpes zoster   . History of skin cancer   . Hyperlipidemia   . Hypertension   . Hypothyroidism   . Iron deficiency anemia due to chronic  blood loss 04/06/2018  . OA (osteoarthritis)   . OSA on CPAP 01/16/2018   Mild OSA overall with an AHI of 5.8/h but during REM sleep moderate with an AHI of 28/h.  Oxygen saturations dropped to 76% during respiratory events.  Now on CPAP at 9 cm H2O   . Osteoporosis   . Palpitations   . Pre-diabetes   . PUD (peptic ulcer disease)   . Skin cancer of nose    Jenna [name]    Past Surgical History Past Surgical History:  Procedure Laterality Date  . ABDOMINAL HYSTERECTOMY     partial  . BIOPSY  04/24/2018   Procedure: BIOPSY;  Surgeon:  Rogene Houston, MD;  [facility];  Service: Endoscopy;;  gastric body ulcerated surface  . BREAST BIOPSY     Right  . CATARACT EXTRACTION W/PHACO  10/28/2011   Procedure: CATARACT EXTRACTION PHACO AND INTRAOCULAR LENS PLACEMENT (IOC);  Surgeon: Tonny Branch, MD;  Location: [facility];  Service: Ophthalmology;  Laterality: Right;  CDE 18.38  . CATARACT EXTRACTION W/PHACO  02/14/2012   Procedure: CATARACT EXTRACTION PHACO AND INTRAOCULAR LENS PLACEMENT (IOC);  Surgeon: Tonny Branch, MD;  Location: [facility];  Service: Ophthalmology;  Laterality: Left;  CDE 17.20  . CESAREAN SECTION    . COLONOSCOPY N/A 05/31/2013   Procedure: COLONOSCOPY;  Surgeon: Rogene Houston, MD;  [facility];  Service: Endoscopy;  Laterality: N/A;  240  . COLONOSCOPY N/A 04/24/2018   Procedure: COLONOSCOPY;  Surgeon: Rogene Houston, MD;  [facility];  Service: Endoscopy;  Laterality: N/A;  9:30  . ESOPHAGOGASTRODUODENOSCOPY N/A 04/24/2018   Procedure: ESOPHAGOGASTRODUODENOSCOPY (EGD);  Surgeon: Rogene Houston, MD;  [facility];  Service: Endoscopy;  Laterality: N/A;  . EYE SURGERY     skin removal   . FRACTURE SURGERY  1952   fractured right arm    Family History Family History  Problem Relation Age of Onset  . CVA Mother   . Stroke Mother   . Osteoporosis Mother   . Cancer Father        prostate  . Bronchitis Sister   . COPD Sister   . Early death Brother        died in [place]  . Carpal tunnel syndrome Sister   . Cancer Brother        throat  . Cancer Brother        lung  . Cancer Brother        throat  . Stroke Brother   . Heart attack Brother   . Gout Brother   . Heart Problems Brother        stents  . CAD Brother   . Hypertension Brother   . Gout Brother   . Arthritis Brother   . Diabetes Son   . Coronary artery disease Neg Hx        No premature  . Anesthesia problems Neg Hx   . Hypotension Neg Hx   . Malignant hyperthermia Neg Hx   . Pseudochol  deficiency Neg Hx      Social History  reports that she has never smoked. She has never used smokeless tobacco. She reports that she does not drink alcohol or use drugs.  Medications  Current Outpatient Medications:  .  acetaminophen (TYLENOL) 500 MG tablet, Take 500 mg by mouth daily as needed for moderate pain or headache., Disp: , Rfl:  .  Cholecalciferol (VITAMIN D3) 2000 UNITS TABS,  Take 2,000 Units by mouth daily. , Disp: , Rfl:  .  Cyanocobalamin (B-12 COMPLIANCE INJECTION IJ), Inject 1 Dose as directed every 30 (thirty) days., Disp: , Rfl:  .  diltiazem (CARDIZEM) 30 MG tablet, Take 1 tablet every 4 hours AS NEEDED for AFIB heart rate over 100, Disp: 45 tablet, Rfl: 1 .  furosemide (LASIX) 40 MG tablet, Take 1 tablet (40 mg total) by mouth daily as needed. (Patient taking differently: Take 40 mg by mouth daily as needed for fluid or edema. ), Disp: 30 tablet, Rfl: 3 .  hydrocortisone (ANUSOL-HC) 2.5 % rectal cream, APPLY RECTALLY DAILY AS NEEDED FOR HEMMORHOIDS OR ITCHING. (Patient taking differently: Place 1 application rectally daily. ), Disp: 30 g, Rfl: 0 .  levothyroxine (SYNTHROID, LEVOTHROID) 75 MCG tablet, Take 1 tablet (75 mcg total) by mouth daily., Disp: 30 tablet, Rfl: 10 .  metoprolol succinate (TOPROL-XL) 50 MG 24 hr tablet, TAKE 1 TABLET DAILY, TAKE WITH OR IMMEDIATELY FOLLOWING A MEAL (Patient taking differently: Take 50 mg by mouth daily. ), Disp: 30 tablet, Rfl: 0 .  Misc. Devices MISC, by Does not apply route. CPAP, Disp: , Rfl:  .  pantoprazole (PROTONIX) 40 MG tablet, Take 1 tablet (40 mg total) by mouth daily before breakfast., Disp: 30 tablet, Rfl: 5 .  rivaroxaban (XARELTO) 20 MG TABS tablet, Take 1 tablet (20 mg total) by mouth at bedtime., Disp: , Rfl:   Allergies Daypro [oxaprozin]; Diclofenac sodium; Effexor [venlafaxine hydrochloride]; Prozac [fluoxetine hcl]; Amitriptyline; Cymbalta [duloxetine hcl]; Lipitor [atorvastatin calcium]; Penicillins; Savella  [milnacipran hcl]; Sulfa antibiotics; and Zocor [simvastatin]  Review of Systems Review of Systems - Oncology ROS negative   Physical Exam  Vitals Wt Readings from Last 3 Encounters:  05/10/18 209 lb 11.2 oz (95.1 kg)  04/06/18 205 lb 3.2 oz (93.1 kg)  03/30/18 208 lb 12.8 oz (94.7 kg)   Temp Readings from Last 3 Encounters:  05/10/18 98.3 F (36.8 C) (Oral)  04/24/18 98 F (36.7 C)  04/19/18 98 F (36.7 C) (Oral)   BP Readings from Last 3 Encounters:  05/10/18 (!) 139/45  04/24/18 (!) 124/57  04/19/18 (!) 127/43   Pulse Readings from Last 3 Encounters:  05/10/18 65  04/24/18 61  04/19/18 62   Constitutional: Well-developed, well-nourished, and in no distress.   HENT: Head: Normocephalic and atraumatic.  Mouth/Throat: No oropharyngeal exudate. Mucosa moist. Eyes: Pupils are equal, round, and reactive to light. Conjunctivae are normal. No scleral icterus.  Neck: Normal range of motion. Neck supple. No JVD present.  Cardiovascular: Normal rate, regular rhythm and normal heart sounds.  Exam reveals no gallop and no friction rub.   No murmur heard. Pulmonary/Chest: Effort normal and breath sounds normal. No respiratory distress. No wheezes.No rales.  Abdominal: Soft. Bowel sounds are normal. No distension. There is no tenderness. There is no guarding.  Musculoskeletal: No edema or tenderness.  Lymphadenopathy: No cervical, axillary or supraclavicular adenopathy.  Neurological: Alert and oriented to person, place, and time. No cranial nerve deficit.  Skin: Skin is warm and dry. No rash noted. No erythema. No pallor.  Psychiatric: Affect and judgment normal.   Labs No visits with results within 3 Day(s) from this visit.  Latest known visit with results is:  Appointment on 05/05/2018  Component Date Value Ref Range Status  . WBC 05/05/2018 5.4  4.0 - 10.5 K/uL Final  . RBC 05/05/2018 4.28  3.87 - 5.11 MIL/uL Final  . Hemoglobin 05/05/2018 11.4* 12.0 - 15.0 g/dL Final   .  HCT 05/05/2018 37.0  36.0 - 46.0 % Final  . MCV 05/05/2018 86.4  80.0 - 100.0 fL Final  . MCH 05/05/2018 26.6  26.0 - 34.0 pg Final  . MCHC 05/05/2018 30.8  30.0 - 36.0 g/dL Final  . RDW 05/05/2018 24.0* 11.5 - 15.5 % Final  . Platelets 05/05/2018 192  150 - 400 K/uL Final  . nRBC 05/05/2018 0.0  0.0 - 0.2 % Final  . Neutrophils Relative % 05/05/2018 71  % Final  . Neutro Abs 05/05/2018 3.9  1.7 - 7.7 K/uL Final  . Lymphocytes Relative 05/05/2018 16  % Final  . Lymphs Abs 05/05/2018 0.9  0.7 - 4.0 K/uL Final  . Monocytes Relative 05/05/2018 10  % Final  . Monocytes Absolute 05/05/2018 0.5  0.1 - 1.0 K/uL Final  . Eosinophils Relative 05/05/2018 2  % Final  . Eosinophils Absolute 05/05/2018 0.1  0.0 - 0.5 K/uL Final  . Basophils Relative 05/05/2018 1  % Final  . Basophils Absolute 05/05/2018 0.0  0.0 - 0.1 K/uL Final  . Immature Granulocytes 05/05/2018 0  % Final  . Abs Immature Granulocytes 05/05/2018 0.01  0.00 - 0.07 K/uL Final   Performed at [facility], [address], [place], [place] [address]  . Sodium 05/05/2018 135  135 - 145 mmol/L Final  . Potassium 05/05/2018 4.1  3.5 - 5.1 mmol/L Final  . Chloride 05/05/2018 99  98 - 111 mmol/L Final  . CO2 05/05/2018 26  22 - 32 mmol/L Final  . Glucose, Bld 05/05/2018 106* 70 - 99 mg/dL Final  . BUN 05/05/2018 10  8 - 23 mg/dL Final  . Creatinine, Ser 05/05/2018 0.62  0.44 - 1.00 mg/dL Final  . Calcium 05/05/2018 8.9  8.9 - 10.3 mg/dL Final  . Total Protein 05/05/2018 6.5  6.5 - 8.1 g/dL Final  . Albumin 05/05/2018 4.0  3.5 - 5.0 g/dL Final  . AST 05/05/2018 22  15 - 41 U/L Final  . ALT 05/05/2018 18  0 - 44 U/L Final  . Alkaline Phosphatase 05/05/2018 82  38 - 126 U/L Final  . Total Bilirubin 05/05/2018 0.8  0.3 - 1.2 mg/dL Final  . GFR calc non Af Amer 05/05/2018 >60  >60 mL/min Final  . GFR calc Af Amer 05/05/2018 >60  >60 mL/min Final  . Anion gap 05/05/2018 10  5 - 15 Final   Performed at [facility], [address]  [place], [place] [address]  . LDH 05/05/2018 141  98 - 192 U/L Final   Performed at [facility], [address] [place], [place] [address]  . Total Protein ELP 05/05/2018 6.0  6.0 - 8.5 g/dL Final  . Albumin ELP 05/05/2018 3.7  2.9 - 4.4 g/dL Final  . Alpha-1-Globulin 05/05/2018 0.2  0.0 - 0.4 g/dL Final  . Alpha-2-Globulin 05/05/2018 0.6  0.4 - 1.0 g/dL Final  . Beta Globulin 05/05/2018 0.9  0.7 - 1.3 g/dL Final  . Gamma Globulin 05/05/2018 0.5  0.4 - 1.8 g/dL Final  . M-Spike, % 05/05/2018 Not Observed  Not Observed g/dL Final  . SPE Interp. 05/05/2018 Comment   Final   Comment: (NOTE) The SPE pattern appears essentially unremarkable. Evidence of monoclonal protein is not apparent. Performed At: [facility] [place], [place] [id number] Rush Farmer MD BP:[id number]   . Comment 05/05/2018 Comment   Final   Comment: (NOTE) Protein electrophoresis scan will follow via computer, mail, or courier delivery.   [name] GLOBULIN, TOTAL  05/05/2018 2.3  2.2 - 3.9 g/dL Corrected  . A/G Ratio 05/05/2018 1.6  0.7 - 1.7 Corrected  . Ferritin 05/05/2018 167  11 - 307 ng/mL Final   Performed at [facility], [address], [place], [place] [address]  . Vitamin B-12 05/05/2018 361  180 - 914 pg/mL Final   Comment: (NOTE) This assay is not validated for testing neonatal or myeloproliferative syndrome specimens for Vitamin B12 levels. Performed at [facility], [address] [place], [place] [address]   . Folate 05/05/2018 21.1  >5.9 ng/mL Final   Performed at [facility], [address], [place], [place] [address]     Pathology Orders Placed This Encounter  Procedures  . CBC with Differential/Platelet    Standing Status:   Future    Standing Expiration Date:   05/10/2020  . Comprehensive metabolic panel    Standing Status:   Future    Standing Expiration Date:   05/10/2020  . Lactate dehydrogenase    Standing Status:   Future    Standing Expiration Date:   05/10/2020  .  Ferritin    Standing Status:   Future    Standing Expiration Date:   05/10/2020       Zoila Shutter MD

## 2018-05-10 NOTE — Patient Instructions (Signed)
[place] at [facility]  Discharge Instructions:  You saw Dr. Walden Field today. _______________________________________________________________  Thank you for choosing [place] at [facility] to provide your oncology and hematology care.  To afford each patient quality time with our providers, please arrive at least 15 minutes before your scheduled appointment.  You need to re-schedule your appointment if you arrive 10 or more minutes late.  We strive to give you quality time with our providers, and arriving late affects you and other patients whose appointments are after yours.  Also, if you no show three or more times for appointments you may be dismissed from the clinic.  Again, thank you for choosing [place] at [place] hope is that these requests will allow you access to exceptional care and in a timely manner. _______________________________________________________________  If you have questions after your visit, please contact our office at [phone] between the hours of 8:30 a.m. and 5:00 p.m. Voicemails left after 4:30 p.m. will not be returned until the following business day. _______________________________________________________________  For prescription refill requests, have your pharmacy contact our office. _______________________________________________________________  Recommendations made by the consultant and any test results will be sent to your referring physician. _______________________________________________________________

## 2018-05-11 LAB — METHYLMALONIC ACID, SERUM: Methylmalonic Acid, Quantitative: 131 nmol/L (ref 0–378)

## 2018-05-15 ENCOUNTER — Other Ambulatory Visit: Payer: Self-pay | Admitting: Family Medicine

## 2018-05-18 ENCOUNTER — Ambulatory Visit (INDEPENDENT_AMBULATORY_CARE_PROVIDER_SITE_OTHER): Payer: Medicare Other | Admitting: *Deleted

## 2018-05-18 DIAGNOSIS — E538 Deficiency of other specified B group vitamins: Secondary | ICD-10-CM

## 2018-05-18 MED ORDER — CYANOCOBALAMIN 1000 MCG/ML IJ SOLN
1000.0000 ug | INTRAMUSCULAR | Status: AC
  Administered 2018-05-18 – 2019-04-26 (×12): 1000 ug via INTRAMUSCULAR

## 2018-05-18 NOTE — Patient Instructions (Signed)
Cyanocobalamin, Vitamin B12 injection What is this medicine? CYANOCOBALAMIN (sye an oh koe BAL a min) is a man made form of vitamin B12. Vitamin B12 is used in the growth of healthy blood cells, nerve cells, and proteins in the body. It also helps with the metabolism of fats and carbohydrates. This medicine is used to treat people who can not absorb vitamin B12. This medicine may be used for other purposes; ask your health care provider or pharmacist if you have questions. COMMON BRAND NAME(S): B-12 Compliance Kit, B-12 Injection Kit, Cyomin, LA-12, Nutri-Twelve, Physicians EZ Use B-12, Primabalt What should I tell my health care provider before I take this medicine? They need to know if you have any of these conditions: -kidney disease -Leber's disease -megaloblastic anemia -an unusual or allergic reaction to cyanocobalamin, cobalt, other medicines, foods, dyes, or preservatives -pregnant or trying to get pregnant -breast-feeding How should I use this medicine? This medicine is injected into a muscle or deeply under the skin. It is usually given by a health care professional in a clinic or doctor's office. However, your doctor may teach you how to inject yourself. Follow all instructions. Talk to your pediatrician regarding the use of this medicine in children. Special care may be needed. Overdosage: If you think you have taken too much of this medicine contact a poison control center or emergency room at once. NOTE: This medicine is only for you. Do not share this medicine with others. What if I miss a dose? If you are given your dose at a clinic or doctor's office, call to reschedule your appointment. If you give your own injections and you miss a dose, take it as soon as you can. If it is almost time for your next dose, take only that dose. Do not take double or extra doses. What may interact with this medicine? -colchicine -heavy alcohol intake This list may not describe all possible  interactions. Give your health care provider a list of all the medicines, herbs, non-prescription drugs, or dietary supplements you use. Also tell them if you smoke, drink alcohol, or use illegal drugs. Some items may interact with your medicine. What should I watch for while using this medicine? Visit your doctor or health care professional regularly. You may need blood work done while you are taking this medicine. You may need to follow a special diet. Talk to your doctor. Limit your alcohol intake and avoid smoking to get the best benefit. What side effects may I notice from receiving this medicine? Side effects that you should report to your doctor or health care professional as soon as possible: -allergic reactions like skin rash, itching or hives, swelling of the face, lips, or tongue -blue tint to skin -chest tightness, pain -difficulty breathing, wheezing -dizziness -red, swollen painful area on the leg Side effects that usually do not require medical attention (report to your doctor or health care professional if they continue or are bothersome): -diarrhea -headache This list may not describe all possible side effects. Call your doctor for medical advice about side effects. You may report side effects to FDA at 1-800-FDA-1088. Where should I keep my medicine? Keep out of the reach of children. Store at room temperature between 15 and 30 degrees C (59 and 85 degrees F). Protect from light. Throw away any unused medicine after the expiration date. NOTE: This sheet is a summary. It may not cover all possible information. If you have questions about this medicine, talk to your doctor, pharmacist, or   health care provider.  2019 Elsevier/Gold Standard (2007-08-21 22:10:20)  

## 2018-05-18 NOTE — Progress Notes (Signed)
Vitamin b12 injection given and patient tolerated well.  

## 2018-06-14 ENCOUNTER — Other Ambulatory Visit: Payer: Self-pay | Admitting: Nurse Practitioner

## 2018-06-19 ENCOUNTER — Ambulatory Visit (INDEPENDENT_AMBULATORY_CARE_PROVIDER_SITE_OTHER): Payer: Medicare Other | Admitting: *Deleted

## 2018-06-19 DIAGNOSIS — E538 Deficiency of other specified B group vitamins: Secondary | ICD-10-CM

## 2018-06-19 NOTE — Progress Notes (Signed)
Pt given Cyanocobalamin inj Tolerated well 

## 2018-07-21 ENCOUNTER — Ambulatory Visit (INDEPENDENT_AMBULATORY_CARE_PROVIDER_SITE_OTHER): Payer: Medicare Other | Admitting: *Deleted

## 2018-07-21 DIAGNOSIS — E538 Deficiency of other specified B group vitamins: Secondary | ICD-10-CM | POA: Diagnosis not present

## 2018-07-21 NOTE — Progress Notes (Signed)
Pt given Cyanocobalamin inj Tolerated well 

## 2018-07-22 ENCOUNTER — Other Ambulatory Visit: Payer: Self-pay | Admitting: Cardiology

## 2018-07-26 DIAGNOSIS — M170 Bilateral primary osteoarthritis of knee: Secondary | ICD-10-CM | POA: Diagnosis not present

## 2018-07-26 DIAGNOSIS — M25561 Pain in right knee: Secondary | ICD-10-CM | POA: Diagnosis not present

## 2018-07-28 ENCOUNTER — Ambulatory Visit: Payer: Medicare Other | Admitting: Family Medicine

## 2018-08-01 ENCOUNTER — Encounter: Payer: Self-pay | Admitting: Family Medicine

## 2018-08-09 ENCOUNTER — Encounter: Payer: Self-pay | Admitting: Family Medicine

## 2018-08-09 ENCOUNTER — Ambulatory Visit (INDEPENDENT_AMBULATORY_CARE_PROVIDER_SITE_OTHER): Payer: Medicare Other | Admitting: Family Medicine

## 2018-08-09 ENCOUNTER — Ambulatory Visit (INDEPENDENT_AMBULATORY_CARE_PROVIDER_SITE_OTHER): Payer: Medicare Other

## 2018-08-09 ENCOUNTER — Other Ambulatory Visit: Payer: Self-pay

## 2018-08-09 VITALS — BP 126/64 | HR 63 | Temp 98.0°F | Ht 63.0 in | Wt 207.0 lb

## 2018-08-09 DIAGNOSIS — D508 Other iron deficiency anemias: Secondary | ICD-10-CM | POA: Diagnosis not present

## 2018-08-09 DIAGNOSIS — M25512 Pain in left shoulder: Secondary | ICD-10-CM | POA: Diagnosis not present

## 2018-08-09 DIAGNOSIS — R109 Unspecified abdominal pain: Secondary | ICD-10-CM

## 2018-08-09 DIAGNOSIS — E538 Deficiency of other specified B group vitamins: Secondary | ICD-10-CM | POA: Diagnosis not present

## 2018-08-09 DIAGNOSIS — I480 Paroxysmal atrial fibrillation: Secondary | ICD-10-CM

## 2018-08-09 DIAGNOSIS — E7800 Pure hypercholesterolemia, unspecified: Secondary | ICD-10-CM

## 2018-08-09 DIAGNOSIS — E039 Hypothyroidism, unspecified: Secondary | ICD-10-CM | POA: Diagnosis not present

## 2018-08-09 DIAGNOSIS — M797 Fibromyalgia: Secondary | ICD-10-CM

## 2018-08-09 DIAGNOSIS — J301 Allergic rhinitis due to pollen: Secondary | ICD-10-CM

## 2018-08-09 DIAGNOSIS — E559 Vitamin D deficiency, unspecified: Secondary | ICD-10-CM

## 2018-08-09 DIAGNOSIS — I10 Essential (primary) hypertension: Secondary | ICD-10-CM

## 2018-08-09 DIAGNOSIS — Z789 Other specified health status: Secondary | ICD-10-CM | POA: Diagnosis not present

## 2018-08-09 DIAGNOSIS — M4802 Spinal stenosis, cervical region: Secondary | ICD-10-CM | POA: Diagnosis not present

## 2018-08-09 DIAGNOSIS — M4722 Other spondylosis with radiculopathy, cervical region: Secondary | ICD-10-CM | POA: Diagnosis not present

## 2018-08-09 DIAGNOSIS — M542 Cervicalgia: Secondary | ICD-10-CM

## 2018-08-09 LAB — MICROSCOPIC EXAMINATION
RBC, UA: NONE SEEN /hpf (ref 0–2)
Renal Epithel, UA: NONE SEEN /hpf

## 2018-08-09 LAB — URINALYSIS, COMPLETE
Bilirubin, UA: NEGATIVE
Glucose, UA: NEGATIVE
KETONES UA: NEGATIVE
Nitrite, UA: NEGATIVE
Protein, UA: NEGATIVE
RBC, UA: NEGATIVE
SPEC GRAV UA: 1.015 (ref 1.005–1.030)
Urobilinogen, Ur: 0.2 mg/dL (ref 0.2–1.0)
pH, UA: 7 (ref 5.0–7.5)

## 2018-08-09 MED ORDER — FLUTICASONE PROPIONATE 50 MCG/ACT NA SUSP: 2.0000 | Freq: Every day | NASAL | 6 refills | Status: DC

## 2018-08-09 NOTE — Patient Instructions (Addendum)
Medicare Annual Wellness Visit  [place] and the medical providers at [facility] strive to bring you the best medical care.  In doing so we not only want to address your current medical conditions and concerns but also to detect new conditions early and prevent illness, disease and health-related problems.    Medicare offers a yearly Wellness Visit which allows our clinical staff to assess your need for preventative services including immunizations, lifestyle education, counseling to decrease risk of preventable diseases and screening for fall risk and other medical concerns.    This visit is provided free of charge (no copay) for all Medicare recipients. The clinical pharmacists at [place] have begun to conduct these Wellness Visits which will also include a thorough review of all your medications.    As you primary medical provider recommend that you make an appointment for your Annual Wellness Visit if you have not done so already this year.  You may set up this appointment before you leave today or you may call back [phone]) and schedule an appointment.  Please make sure when you call that you mention that you are scheduling your Annual Wellness Visit with the clinical pharmacist so that the appointment may be made for the proper length of time.     Continue current medications. Continue good therapeutic lifestyle changes which include good diet and exercise. Fall precautions discussed with patient. If an FOBT was given today- please return it to our front desk. If you are over 22 years old - you may need Prevnar 48 or the adult Pneumonia vaccine.  **Flu shots are available--- please call and schedule a FLU-CLINIC appointment**  After your visit with [place] today you will receive a survey in the mail or online from [employer] regarding your care with [place]. Please take a moment to fill this out. Your feedback is very  important to [place] as you can help [place] better understand your patient needs as well as improve your experience and satisfaction. WE CARE ABOUT YOU!!!   We will call with lab work and x-ray results as soon as these results become available Continue to follow-up with cardiology as planned Continue to drink plenty of fluids and stay well-hydrated Continue to practice good respiratory and hand hygiene Stay as of crowds of people as much as possible to protect yourself from the covid 19 virus Use Flonase 1 spray each nostril at bedtime Continue to drink plenty of water and fluids as moisture barriers help protect you from allergens and viruses.

## 2018-08-09 NOTE — Progress Notes (Signed)
Subjective:    Patient ID: Jenna [name], female    DOB: April 17, 1936, 83 y.o.   MRN: [id number]  HPI  Pt here for follow up and management of chronic medical problems which includes hypertension and hyperlipidemia. She is taking medication regularly.  The patient today complains with sneezing and allergy issues.  She also complains of left-sided neck and shoulder pain and some right flank pain.  She has a history of iron deficiency anemia and in November had both an endoscopy and colonoscopy and an ulcerated gastric polyp was found at that time.  She also has a history of sleep apnea hypertension B12 deficiency hyperlipidemia and prediabetes.  Because of the paroxysmal atrial fibrillation she is taking Xarelto 20.  She also takes pantoprazole for her stomach.  The patient is pleasant and doing well overall and somewhat anxious about the coronavirus.  She says she has had sneezing and allergy issues off and on for the past couple weeks.  She complains of left-sided neck pain radiating to her left upper shoulder and this is been going on for quite a long time and she is not sure if this is fibromyalgia related or not.  She does not ever recall any injury.  She is having some right flank pain more close to the spine and has no history of kidney stones.  She denies any chest pain or shortness of breath anymore than usual.  She did have the gastric polyps removed in November and has not noticed any blood in her stool or had any black tarry bowel movements.  There is not been any trouble with swallowing or heartburn.  She is passing her water without problems.  She is due to get an eye exam.   Patient Active Problem List   Diagnosis Date Noted  . Iron deficiency anemia due to chronic blood loss 04/06/2018  . Guaiac positive stools 03/30/2018  . Absolute anemia 03/30/2018  . OSA on CPAP 01/16/2018  . B12 deficiency anemia 07/15/2017  . Right knee pain 01/30/2015  . Hypertension   . Precordial pain  02/05/2014  . Palpitations 02/05/2014  . History of shingles 01/11/2014  . Pre-diabetes 11/21/2013  . Obesity (BMI 30.0-34.9) 11/21/2013  . Mixed hypercholesterolemia and hypertriglyceridemia 11/21/2013  . Metabolic syndrome 08/65/7846  . Multiple drug allergies 10/23/2013  . Statin intolerance 10/23/2013  . Osteopenia of the elderly 07/25/2013  . Vitamin D deficiency 06/19/2013  . Unspecified constipation 04/23/2013  . Rectal bleeding 04/23/2013  . Gout 02/21/2013  . Osteoarthritis 02/21/2013  . Fibromyalgia   . Hyperlipidemia 11/14/2008   Outpatient Encounter Medications as of 08/09/2018  Medication Sig  . Cholecalciferol (VITAMIN D3) 2000 UNITS TABS Take 2,000 Units by mouth daily.   . Cyanocobalamin (B-12 COMPLIANCE INJECTION IJ) Inject 1 Dose as directed every 30 (thirty) days.  [name] levothyroxine (SYNTHROID, LEVOTHROID) 75 MCG tablet Take 1 tablet (75 mcg total) by mouth daily.  . metoprolol succinate (TOPROL-XL) 50 MG 24 hr tablet TAKE 1 TABLET DAILY WITH OR IMMEDIATELY FOLLOWING A MEAL.  . Misc. Devices MISC by Does not apply route. CPAP  . pantoprazole (PROTONIX) 40 MG tablet Take 1 tablet (40 mg total) by mouth daily before breakfast.  . XARELTO 20 MG TABS tablet TAKE 1 TABLET EVERYDAY WITH SUPPER.  [name] acetaminophen (TYLENOL) 500 MG tablet Take 500 mg by mouth daily as needed for moderate pain or headache.  . diltiazem (CARDIZEM) 30 MG tablet Take 1 tablet every 4 hours AS NEEDED for AFIB heart  rate over 100 (Patient not taking: Reported on 08/09/2018)  . furosemide (LASIX) 40 MG tablet Take 1 tablet (40 mg total) by mouth daily as needed. (Patient not taking: Reported on 08/09/2018)  . hydrocortisone (ANUSOL-HC) 2.5 % rectal cream APPLY RECTALLY DAILY AS NEEDED FOR HEMMORHOIDS OR ITCHING. (Patient not taking: No sig reported)   Facility-Administered Encounter Medications as of 08/09/2018  Medication  . cyanocobalamin ((VITAMIN B-12)) injection 1,000 mcg     Review of Systems   Constitutional: Negative.   HENT: Positive for postnasal drip (allergies / sneezing ).   Eyes: Negative.   Respiratory: Negative.   Cardiovascular: Negative.   Gastrointestinal: Negative.   Endocrine: Negative.   Genitourinary: Positive for flank pain (right side ).  Musculoskeletal: Positive for arthralgias (left side neck and shoulder pain ) and neck stiffness.  Skin: Negative.   Allergic/Immunologic: Negative.   Neurological: Negative.   Hematological: Negative.   Psychiatric/Behavioral: Negative.        Objective:   Physical Exam Vitals signs and nursing note reviewed.  Constitutional:      General: She is not in acute distress.    Appearance: Normal appearance. She is well-developed. She is obese.     Comments: The patient is pleasant and alert and looks and acts much younger than her stated age of 83 years.  HENT:     Head: Normocephalic and atraumatic.     Right Ear: Tympanic membrane, ear canal and external ear normal. There is no impacted cerumen.     Left Ear: Tympanic membrane, ear canal and external ear normal. There is no impacted cerumen.     Nose: Nose normal. No congestion or rhinorrhea.     Comments: Nasal pallor with minimal drainage and congestion    Mouth/Throat:     Mouth: Mucous membranes are moist.     Pharynx: Oropharynx is clear. No oropharyngeal exudate or posterior oropharyngeal erythema.  Eyes:     General: No scleral icterus.       Right eye: No discharge.        Left eye: No discharge.     Extraocular Movements: Extraocular movements intact.     Conjunctiva/sclera: Conjunctivae normal.     Pupils: Pupils are equal, round, and reactive to light.     Comments: Patient is due for eye exam but will wait until coronavirus issues settle down before scheduling this.  Neck:     Musculoskeletal: Normal range of motion and neck supple. Muscular tenderness present.     Thyroid: No thyromegaly.     Vascular: No carotid bruit or JVD.     Comments: No  bruits thyromegaly or anterior cervical adenopathy.  There is some tenderness in the left lateral neck area without adenopathy or thyromegaly. Cardiovascular:     Rate and Rhythm: Normal rate and regular rhythm.     Pulses: Normal pulses.     Heart sounds: Normal heart sounds. No murmur.     Comments: The heart is regular today at 60/min with good pedal pulses and no edema. Pulmonary:     Effort: Pulmonary effort is normal. No respiratory distress.     Breath sounds: Normal breath sounds. No wheezing, rhonchi or rales.     Comments: Clear anteriorly and posteriorly without wheezes rhonchi or rales. Abdominal:     General: Bowel sounds are normal.     Palpations: Abdomen is soft. There is no mass.     Tenderness: There is no abdominal tenderness. There is no guarding.  Comments: There is no liver or spleen enlargement or epigastric tenderness.  There is no suprapubic tenderness masses or bruits.  Musculoskeletal: Normal range of motion.        General: Tenderness present.     Right lower leg: No edema.     Left lower leg: No edema.     Comments: Good mobility of joints and recent knee injection by orthopedic surgeon.  There is tenderness of the right lower thoracic spine area near the bra line.  There is no rash noted there.  Lymphadenopathy:     Cervical: No cervical adenopathy.  Skin:    General: Skin is warm and dry.     Findings: No lesion or rash.  Neurological:     General: No focal deficit present.     Mental Status: She is alert and oriented to person, place, and time.     Cranial Nerves: No cranial nerve deficit.     Gait: Gait normal.     Deep Tendon Reflexes: Reflexes are normal and symmetric. Reflexes normal.  Psychiatric:        Mood and Affect: Mood normal.        Behavior: Behavior normal.        Thought Content: Thought content normal.        Judgment: Judgment normal.    BP 126/64 (BP Location: Left Arm)   Pulse 63   Temp 98 F (36.7 C) (Oral)   Ht _0   (1.6 m)   Wt 207 lb (93.9 kg)   BMI 36.67 kg/m   C-spine films and chest x-ray films still pending===      Assessment & Plan:  1. Essential hypertension -The blood pressure is good today and she will continue with current treatment - BMP8+EGFR - CBC with Differential/Platelet - Hepatic function panel - DG Chest 2 View; Future  2. Pure hypercholesterolemia -Patient is statin intolerant and will continue with as aggressive therapeutic lifestyle changes as possible including diet and exercise - CBC with Differential/Platelet - Lipid panel - DG Chest 2 View; Future  3. Hypothyroidism, unspecified type -Continue with thyroid replacement pending results of lab work - CBC with Differential/Platelet - Thyroid Panel With TSH  4. Vitamin D deficiency -Continue with vitamin D replacement pending results of lab work - CBC with Differential/Platelet - VITAMIN D 25 Hydroxy (Vit-D Deficiency, Fractures)  5. Paroxysmal atrial fibrillation (HCC) -Patient was in normal sinus rhythm today.  She sees the cardiologist regularly.  This is Dr. Domenic Polite and she will follow-up with him as planned. - CBC with Differential/Platelet  6. B12 deficiency -Continue with B12 replacement pending results of lab work  7. Fibromyalgia -Continue with fibromyalgia treatments and Tylenol and moist heat when needed.  8. Statin intolerance -Continue with as aggressive therapeutic lifestyle changes as possible  9. Neck pain -Use moist heat and take Tylenol - DG Cervical Spine Complete; Future  10. Right flank pain -Use moist heat and take Tylenol - Urinalysis, Complete - Urine Culture  11. Left shoulder pain, unspecified chronicity -Use moist heat and take Tylenol - DG Chest 2 View; Future - DG Cervical Spine Complete; Future  12. Other iron deficiency anemia -Check CBC and FOBT  13. Seasonal allergic rhinitis due to pollen -Start Flonase 1 spray each nostril at bedtime and avoid allergen  conditions as much as possible  Meds ordered this encounter  Medications  . fluticasone (FLONASE) 50 MCG/ACT nasal spray    Sig: Place 2 sprays into both nostrils daily.  Dispense:  16 g    Refill:  6   Patient Instructions                       Medicare Annual Wellness Visit  [facility] and the medical providers at [facility] strive to bring you the best medical care.  In doing so we not only want to address your current medical conditions and concerns but also to detect new conditions early and prevent illness, disease and health-related problems.    Medicare offers a yearly Wellness Visit which allows our clinical staff to assess your need for preventative services including immunizations, lifestyle education, counseling to decrease risk of preventable diseases and screening for fall risk and other medical concerns.    This visit is provided free of charge (no copay) for all Medicare recipients. The clinical pharmacists at [facility] have begun to conduct these Wellness Visits which will also include a thorough review of all your medications.    As you primary medical provider recommend that you make an appointment for your Annual Wellness Visit if you have not done so already this year.  You may set up this appointment before you leave today or you may call back [phone]) and schedule an appointment.  Please make sure when you call that you mention that you are scheduling your Annual Wellness Visit with the clinical pharmacist so that the appointment may be made for the proper length of time.     Continue current medications. Continue good therapeutic lifestyle changes which include good diet and exercise. Fall precautions discussed with patient. If an FOBT was given today- please return it to our front desk. If you are over 55 years old - you may need Prevnar 75 or the adult Pneumonia vaccine.  **Flu shots are available--- please  call and schedule a FLU-CLINIC appointment**  After your visit with [place] today you will receive a survey in the mail or online from [employer] regarding your care with [place]. Please take a moment to fill this out. Your feedback is very important to [place] as you can help [place] better understand your patient needs as well as improve your experience and satisfaction. WE CARE ABOUT YOU!!!   We will call with lab work and x-ray results as soon as these results become available Continue to follow-up with cardiology as planned Continue to drink plenty of fluids and stay well-hydrated Continue to practice good respiratory and hand hygiene Stay as of crowds of people as much as possible to protect yourself from the covid 19 virus Use Flonase 1 spray each nostril at bedtime Continue to drink plenty of water and fluids as moisture barriers help protect you from allergens and viruses.  Arrie Senate MD

## 2018-08-10 ENCOUNTER — Other Ambulatory Visit: Payer: Self-pay | Admitting: *Deleted

## 2018-08-10 LAB — CBC WITH DIFFERENTIAL/PLATELET
BASOS: 1 %
Basophils Absolute: 0.1 10*3/uL (ref 0.0–0.2)
EOS (ABSOLUTE): 0.1 10*3/uL (ref 0.0–0.4)
Eos: 1 %
Hematocrit: 40.4 % (ref 34.0–46.6)
Hemoglobin: 13.3 g/dL (ref 11.1–15.9)
Immature Grans (Abs): 0 10*3/uL (ref 0.0–0.1)
Immature Granulocytes: 0 %
Lymphocytes Absolute: 1.3 10*3/uL (ref 0.7–3.1)
Lymphs: 14 %
MCH: 30.2 pg (ref 26.6–33.0)
MCHC: 32.9 g/dL (ref 31.5–35.7)
MCV: 92 fL (ref 79–97)
Monocytes Absolute: 0.6 10*3/uL (ref 0.1–0.9)
Monocytes: 7 %
Neutrophils Absolute: 7.2 10*3/uL — ABNORMAL HIGH (ref 1.4–7.0)
Neutrophils: 77 %
PLATELETS: 223 10*3/uL (ref 150–450)
RBC: 4.4 x10E6/uL (ref 3.77–5.28)
RDW: 13.9 % (ref 11.7–15.4)
WBC: 9.3 10*3/uL (ref 3.4–10.8)

## 2018-08-10 LAB — HEPATIC FUNCTION PANEL
ALT: 14 IU/L (ref 0–32)
AST: 19 IU/L (ref 0–40)
Albumin: 4.4 g/dL (ref 3.6–4.6)
Alkaline Phosphatase: 107 IU/L (ref 39–117)
BILIRUBIN TOTAL: 0.7 mg/dL (ref 0.0–1.2)
BILIRUBIN, DIRECT: 0.15 mg/dL (ref 0.00–0.40)
Total Protein: 6.5 g/dL (ref 6.0–8.5)

## 2018-08-10 LAB — THYROID PANEL WITH TSH
Free Thyroxine Index: 2.9 (ref 1.2–4.9)
T3 Uptake Ratio: 26 % (ref 24–39)
T4, Total: 11.1 ug/dL (ref 4.5–12.0)
TSH: 2.97 u[IU]/mL (ref 0.450–4.500)

## 2018-08-10 LAB — VITAMIN D 25 HYDROXY (VIT D DEFICIENCY, FRACTURES): Vit D, 25-Hydroxy: 53.1 ng/mL (ref 30.0–100.0)

## 2018-08-10 LAB — BMP8+EGFR
BUN/Creatinine Ratio: 13 (ref 12–28)
BUN: 9 mg/dL (ref 8–27)
CALCIUM: 9.3 mg/dL (ref 8.7–10.3)
CO2: 24 mmol/L (ref 20–29)
Chloride: 97 mmol/L (ref 96–106)
Creatinine, Ser: 0.71 mg/dL (ref 0.57–1.00)
GFR calc Af Amer: 92 mL/min/{1.73_m2} (ref >59)
GFR calc non Af Amer: 80 mL/min/{1.73_m2} (ref >59)
Glucose: 98 mg/dL (ref 65–99)
Potassium: 4.9 mmol/L (ref 3.5–5.2)
Sodium: 138 mmol/L (ref 134–144)

## 2018-08-10 LAB — LIPID PANEL
Chol/HDL Ratio: 3.7 ratio (ref 0.0–4.4)
Cholesterol, Total: 226 mg/dL — ABNORMAL HIGH (ref 100–199)
HDL: 61 mg/dL (ref >39)
LDL Calculated: 129 mg/dL — ABNORMAL HIGH (ref 0–99)
Triglycerides: 179 mg/dL — ABNORMAL HIGH (ref 0–149)
VLDL Cholesterol Cal: 36 mg/dL (ref 5–40)

## 2018-08-10 LAB — URINE CULTURE

## 2018-08-10 MED ORDER — ICOSAPENT ETHYL 1 G PO CAPS: 2.0000 | ORAL_CAPSULE | Freq: Two times a day (BID) | ORAL | 3 refills | Status: DC

## 2018-08-18 ENCOUNTER — Other Ambulatory Visit: Payer: Self-pay

## 2018-08-21 ENCOUNTER — Ambulatory Visit: Payer: Medicare Other | Admitting: Cardiology

## 2018-08-21 ENCOUNTER — Ambulatory Visit (INDEPENDENT_AMBULATORY_CARE_PROVIDER_SITE_OTHER): Payer: Medicare Other

## 2018-08-21 ENCOUNTER — Other Ambulatory Visit: Payer: Self-pay

## 2018-08-21 DIAGNOSIS — E538 Deficiency of other specified B group vitamins: Secondary | ICD-10-CM | POA: Diagnosis not present

## 2018-09-06 ENCOUNTER — Other Ambulatory Visit (HOSPITAL_COMMUNITY): Payer: Medicare Other

## 2018-09-11 ENCOUNTER — Other Ambulatory Visit: Payer: Self-pay | Admitting: Family Medicine

## 2018-09-13 ENCOUNTER — Ambulatory Visit (HOSPITAL_COMMUNITY): Payer: Medicare Other | Admitting: Hematology

## 2018-09-14 ENCOUNTER — Ambulatory Visit (HOSPITAL_COMMUNITY): Payer: Medicare Other | Admitting: Nurse Practitioner

## 2018-09-21 ENCOUNTER — Ambulatory Visit (INDEPENDENT_AMBULATORY_CARE_PROVIDER_SITE_OTHER): Payer: Medicare Other | Admitting: *Deleted

## 2018-09-21 ENCOUNTER — Other Ambulatory Visit: Payer: Self-pay

## 2018-09-21 DIAGNOSIS — E538 Deficiency of other specified B group vitamins: Secondary | ICD-10-CM

## 2018-09-21 NOTE — Progress Notes (Signed)
Pt given Cyanocobalamin inj Tolerated well 

## 2018-09-23 ENCOUNTER — Other Ambulatory Visit: Payer: Self-pay

## 2018-09-23 ENCOUNTER — Telehealth: Payer: Self-pay | Admitting: Physician Assistant

## 2018-09-23 ENCOUNTER — Inpatient Hospital Stay (HOSPITAL_COMMUNITY)
Admission: EM | Admit: 2018-09-23 | Discharge: 2018-09-25 | DRG: 556 | Disposition: A | Discharge status: Home / Self Care | Payer: Medicare Other | Attending: Internal Medicine | Admitting: Internal Medicine

## 2018-09-23 ENCOUNTER — Encounter (HOSPITAL_COMMUNITY): Payer: Self-pay | Admitting: *Deleted

## 2018-09-23 ENCOUNTER — Emergency Department (HOSPITAL_COMMUNITY): Payer: Medicare Other

## 2018-09-23 DIAGNOSIS — Z8262 Family history of osteoporosis: Secondary | ICD-10-CM

## 2018-09-23 DIAGNOSIS — I480 Paroxysmal atrial fibrillation: Secondary | ICD-10-CM | POA: Diagnosis not present

## 2018-09-23 DIAGNOSIS — Z79899 Other long term (current) drug therapy: Secondary | ICD-10-CM

## 2018-09-23 DIAGNOSIS — Z9989 Dependence on other enabling machines and devices: Secondary | ICD-10-CM

## 2018-09-23 DIAGNOSIS — Z8042 Family history of malignant neoplasm of prostate: Secondary | ICD-10-CM

## 2018-09-23 DIAGNOSIS — I214 Non-ST elevation (NSTEMI) myocardial infarction: Secondary | ICD-10-CM | POA: Diagnosis present

## 2018-09-23 DIAGNOSIS — Z7951 Long term (current) use of inhaled steroids: Secondary | ICD-10-CM

## 2018-09-23 DIAGNOSIS — E7800 Pure hypercholesterolemia, unspecified: Secondary | ICD-10-CM | POA: Diagnosis not present

## 2018-09-23 DIAGNOSIS — G4733 Obstructive sleep apnea (adult) (pediatric): Secondary | ICD-10-CM

## 2018-09-23 DIAGNOSIS — Z880 Allergy status to penicillin: Secondary | ICD-10-CM

## 2018-09-23 DIAGNOSIS — E66811 Obesity, class 1: Secondary | ICD-10-CM | POA: Diagnosis present

## 2018-09-23 DIAGNOSIS — E785 Hyperlipidemia, unspecified: Secondary | ICD-10-CM | POA: Diagnosis not present

## 2018-09-23 DIAGNOSIS — Z8711 Personal history of peptic ulcer disease: Secondary | ICD-10-CM

## 2018-09-23 DIAGNOSIS — Z90710 Acquired absence of both cervix and uterus: Secondary | ICD-10-CM

## 2018-09-23 DIAGNOSIS — E039 Hypothyroidism, unspecified: Secondary | ICD-10-CM | POA: Diagnosis not present

## 2018-09-23 DIAGNOSIS — Z8249 Family history of ischemic heart disease and other diseases of the circulatory system: Secondary | ICD-10-CM

## 2018-09-23 DIAGNOSIS — Z882 Allergy status to sulfonamides status: Secondary | ICD-10-CM | POA: Diagnosis not present

## 2018-09-23 DIAGNOSIS — R072 Precordial pain: Secondary | ICD-10-CM

## 2018-09-23 DIAGNOSIS — Z79890 Hormone replacement therapy (postmenopausal): Secondary | ICD-10-CM

## 2018-09-23 DIAGNOSIS — Z7901 Long term (current) use of anticoagulants: Secondary | ICD-10-CM

## 2018-09-23 DIAGNOSIS — R7989 Other specified abnormal findings of blood chemistry: Secondary | ICD-10-CM | POA: Diagnosis not present

## 2018-09-23 DIAGNOSIS — M62838 Other muscle spasm: Principal | ICD-10-CM | POA: Diagnosis present

## 2018-09-23 DIAGNOSIS — R0602 Shortness of breath: Secondary | ICD-10-CM | POA: Diagnosis not present

## 2018-09-23 DIAGNOSIS — Z8261 Family history of arthritis: Secondary | ICD-10-CM

## 2018-09-23 DIAGNOSIS — M810 Age-related osteoporosis without current pathological fracture: Secondary | ICD-10-CM | POA: Diagnosis present

## 2018-09-23 DIAGNOSIS — R778 Other specified abnormalities of plasma proteins: Secondary | ICD-10-CM

## 2018-09-23 DIAGNOSIS — R079 Chest pain, unspecified: Secondary | ICD-10-CM

## 2018-09-23 DIAGNOSIS — E669 Obesity, unspecified: Secondary | ICD-10-CM | POA: Diagnosis present

## 2018-09-23 DIAGNOSIS — Z6832 Body mass index (BMI) 32.0-32.9, adult: Secondary | ICD-10-CM

## 2018-09-23 DIAGNOSIS — Z85828 Personal history of other malignant neoplasm of skin: Secondary | ICD-10-CM

## 2018-09-23 DIAGNOSIS — Z833 Family history of diabetes mellitus: Secondary | ICD-10-CM | POA: Diagnosis not present

## 2018-09-23 DIAGNOSIS — E782 Mixed hyperlipidemia: Secondary | ICD-10-CM | POA: Diagnosis not present

## 2018-09-23 DIAGNOSIS — Z800 Family history of malignant neoplasm of digestive organs: Secondary | ICD-10-CM

## 2018-09-23 DIAGNOSIS — E1159 Type 2 diabetes mellitus with other circulatory complications: Secondary | ICD-10-CM | POA: Diagnosis present

## 2018-09-23 DIAGNOSIS — Z888 Allergy status to other drugs, medicaments and biological substances status: Secondary | ICD-10-CM | POA: Diagnosis not present

## 2018-09-23 DIAGNOSIS — I2510 Atherosclerotic heart disease of native coronary artery without angina pectoris: Secondary | ICD-10-CM | POA: Diagnosis present

## 2018-09-23 DIAGNOSIS — M797 Fibromyalgia: Secondary | ICD-10-CM | POA: Diagnosis present

## 2018-09-23 DIAGNOSIS — Z801 Family history of malignant neoplasm of trachea, bronchus and lung: Secondary | ICD-10-CM

## 2018-09-23 DIAGNOSIS — I10 Essential (primary) hypertension: Secondary | ICD-10-CM | POA: Diagnosis present

## 2018-09-23 DIAGNOSIS — Z825 Family history of asthma and other chronic lower respiratory diseases: Secondary | ICD-10-CM

## 2018-09-23 DIAGNOSIS — Z823 Family history of stroke: Secondary | ICD-10-CM

## 2018-09-23 LAB — APTT: aPTT: 38 seconds — ABNORMAL HIGH (ref 24–36)

## 2018-09-23 LAB — CBC
HCT: 39.2 % (ref 36.0–46.0)
Hemoglobin: 13.1 g/dL (ref 12.0–15.0)
MCH: 31.5 pg (ref 26.0–34.0)
MCHC: 33.4 g/dL (ref 30.0–36.0)
MCV: 94.2 fL (ref 80.0–100.0)
Platelets: 176 10*3/uL (ref 150–400)
RBC: 4.16 MIL/uL (ref 3.87–5.11)
RDW: 12.9 % (ref 11.5–15.5)
WBC: 6.8 10*3/uL (ref 4.0–10.5)
nRBC: 0 % (ref 0.0–0.2)

## 2018-09-23 LAB — BASIC METABOLIC PANEL
Anion gap: 11 (ref 5–15)
BUN: 9 mg/dL (ref 8–23)
CO2: 28 mmol/L (ref 22–32)
Calcium: 9 mg/dL (ref 8.9–10.3)
Chloride: 96 mmol/L — ABNORMAL LOW (ref 98–111)
Creatinine, Ser: 0.62 mg/dL (ref 0.44–1.00)
GFR calc Af Amer: >60 mL/min (ref >60)
GFR calc non Af Amer: >60 mL/min (ref >60)
Glucose, Bld: 115 mg/dL — ABNORMAL HIGH (ref 70–99)
Potassium: 4.3 mmol/L (ref 3.5–5.1)
Sodium: 135 mmol/L (ref 135–145)

## 2018-09-23 LAB — TROPONIN I
Troponin I: 0.04 ng/mL (ref <0.03)
Troponin I: 0.09 ng/mL (ref <0.03)
Troponin I: 0.06 ng/mL (ref <0.03)

## 2018-09-23 MED ORDER — ICOSAPENT ETHYL 1 G PO CAPS: 2.0000 | ORAL_CAPSULE | Freq: Two times a day (BID) | ORAL | Status: DC

## 2018-09-23 MED ORDER — ONDANSETRON HCL 4 MG/2ML IJ SOLN: 4.0000 mg | Freq: Four times a day (QID) | INTRAMUSCULAR | Status: DC | PRN

## 2018-09-23 MED ORDER — HEPARIN (PORCINE) 25000 UT/250ML-% IV SOLN: 950.0000 [IU]/h | INTRAVENOUS | Status: DC

## 2018-09-23 MED ORDER — SODIUM CHLORIDE 0.9% FLUSH: 3.0000 mL | Freq: Once | INTRAVENOUS | Status: DC

## 2018-09-23 MED ORDER — LEVOTHYROXINE SODIUM 75 MCG PO TABS
75.0000 ug | ORAL_TABLET | Freq: Every day | ORAL | Status: DC
  Administered 2018-09-24 – 2018-09-25 (×2): 75 ug via ORAL
  Filled 2018-09-23 (×2): qty 1

## 2018-09-23 MED ORDER — ASPIRIN 81 MG PO CHEW
324.0000 mg | CHEWABLE_TABLET | ORAL | Status: AC
  Administered 2018-09-24: 324 mg via ORAL
  Filled 2018-09-23: qty 4

## 2018-09-23 MED ORDER — ASPIRIN EC 81 MG PO TBEC
81.0000 mg | DELAYED_RELEASE_TABLET | Freq: Every day | ORAL | Status: DC
  Administered 2018-09-25: 09:00:00 81 mg via ORAL
  Filled 2018-09-23 (×2): qty 1

## 2018-09-23 MED ORDER — PANTOPRAZOLE SODIUM 40 MG PO TBEC
40.0000 mg | DELAYED_RELEASE_TABLET | Freq: Every day | ORAL | Status: DC
  Administered 2018-09-24: 09:00:00 40 mg via ORAL
  Filled 2018-09-23 (×2): qty 1

## 2018-09-23 MED ORDER — ASPIRIN 300 MG RE SUPP: 300.0000 mg | RECTAL | Status: AC

## 2018-09-23 MED ORDER — HEPARIN SODIUM (PORCINE) 5000 UNIT/ML IJ SOLN: 4000.0000 [IU] | Freq: Once | INTRAMUSCULAR | Status: DC

## 2018-09-23 MED ORDER — ACETAMINOPHEN 325 MG PO TABS
650.0000 mg | ORAL_TABLET | ORAL | Status: DC | PRN
  Administered 2018-09-24: 09:00:00 650 mg via ORAL
  Filled 2018-09-23: qty 2

## 2018-09-23 MED ORDER — ROSUVASTATIN CALCIUM 20 MG PO TABS
20.0000 mg | ORAL_TABLET | Freq: Every day | ORAL | Status: DC
  Filled 2018-09-23: qty 1

## 2018-09-23 MED ORDER — FLUTICASONE PROPIONATE 50 MCG/ACT NA SUSP
2.0000 | Freq: Every day | NASAL | Status: DC
  Filled 2018-09-23: qty 16

## 2018-09-23 MED ORDER — METOPROLOL SUCCINATE ER 50 MG PO TB24
50.0000 mg | ORAL_TABLET | Freq: Every day | ORAL | Status: DC
  Administered 2018-09-24: 50 mg via ORAL
  Filled 2018-09-23 (×2): qty 1

## 2018-09-23 MED ORDER — NITROGLYCERIN 2 % TD OINT
0.5000 [in_us] | TOPICAL_OINTMENT | Freq: Once | TRANSDERMAL | Status: AC
  Administered 2018-09-23: 0.5 [in_us] via TOPICAL
  Filled 2018-09-23: qty 1

## 2018-09-23 MED ORDER — NITROGLYCERIN 0.4 MG SL SUBL: 0.4000 mg | SUBLINGUAL_TABLET | SUBLINGUAL | Status: DC | PRN

## 2018-09-23 NOTE — ED Triage Notes (Signed)
Pt with left sided cp since last night, sob at times per pt, denies N/V or diaphoresis.

## 2018-09-23 NOTE — ED Provider Notes (Signed)
Second troponin 0 0.09.  No change in the EKG.  Discussed with cardiologist at [facility].  He recommended transfer.  Will discuss with hospitalist.  Heparin started.  CRITICAL CARE Performed by: Nat Christen Total critical care time: 30 minutes Critical care time was exclusive of separately billable procedures and treating other patients. Critical care was necessary to treat or prevent imminent or life-threatening deterioration. Critical care was time spent personally by me on the following activities: development of treatment plan with patient and/or surrogate as well as nursing, discussions with consultants, evaluation of patient's response to treatment, examination of patient, obtaining history from patient or surrogate, ordering and performing treatments and interventions, ordering and review of laboratory studies, ordering and review of radiographic studies, pulse oximetry and re-evaluation of patient's condition.   Nat Christen, MD 09/23/18 [phone]

## 2018-09-23 NOTE — H&P (Signed)
History and Physical  JAKIERA EHLER RDE:[id number] DOB: Jun 14, 1935 DOA: 09/23/2018  Referring physician: Dr Lacinda Axon, ED physician PCP: Chipper Herb, MD  Outpatient Specialists: [place]  Patient Coming From: home  Chief Complaint: chest pain  HPI: Jenna [name] is a 83 y.o. female with a history of upper lipidemia, hypertension, hypothyroidism, paroxysmal atrial fibrillation on anticoagulation, obstructive sleep apnea on CPAP.  Patient seen for intermittent chest pain that started earlier this week on Monday or Tuesday and is continued and has worsened until today.  Episodes last approximately 10 to 15 minutes at a time and then resolved.  Initially, Tylenol was helpful, but now it is not helpful at all.  Worse episode was last night and today.  Patient came to the hospital for evaluation.  No provoking factors.  Not worse with exertion, but comes on at rest.  Pain is left-sided substernal.  No radiation.  Denies diaphoresis, nausea, shortness of breath.  Emergency Department Course: Troponin initially 0.05, increased to 0.09.  EKG shows normal sinus rhythm without ST elevation.  Cardiology consulted, who recommended admission at [facility] with heparin to start tomorrow..  Review of Systems:   Pt denies any fevers, chills, nausea, vomiting, diarrhea, constipation, abdominal pain, shortness of breath, dyspnea on exertion, orthopnea, cough, wheezing, palpitations, headache, vision changes, lightheadedness, dizziness, melena, rectal bleeding.  Review of systems are otherwise negative  Past Medical History:  Diagnosis Date   Cataract    DDD (degenerative disc disease)    Fibromyalgia    Herpes zoster    History of skin cancer    Hyperlipidemia    Hypertension    Hypothyroidism    Iron deficiency anemia due to chronic blood loss 04/06/2018   OA (osteoarthritis)    OSA on CPAP 01/16/2018   Mild OSA overall with an AHI of 5.8/h but during REM sleep moderate with an  AHI of 28/h.  Oxygen saturations dropped to 76% during respiratory events.  Now on CPAP at 9 cm H2O    Osteoporosis    Palpitations    Pre-diabetes    PUD (peptic ulcer disease)    Skin cancer of nose    [name]   Past Surgical History:  Procedure Laterality Date   ABDOMINAL HYSTERECTOMY     partial   BIOPSY  04/24/2018   Procedure: BIOPSY;  Surgeon: Rogene Houston, MD;  Location: [facility];  Service: Endoscopy;;  gastric body ulcerated surface   BREAST BIOPSY     Right   CATARACT EXTRACTION W/PHACO  10/28/2011   Procedure: CATARACT EXTRACTION PHACO AND INTRAOCULAR LENS PLACEMENT ([place]);  Surgeon: Tonny Branch, MD;  Location: [facility] ORS;  Service: Ophthalmology;  Laterality: Right;  CDE 18.38   CATARACT EXTRACTION W/PHACO  02/14/2012   Procedure: CATARACT EXTRACTION PHACO AND INTRAOCULAR LENS PLACEMENT (IOC);  Surgeon: Tonny Branch, MD;  Location: [facility] ORS;  Service: Ophthalmology;  Laterality: Left;  CDE 17.20   CESAREAN SECTION     COLONOSCOPY N/A 05/31/2013   Procedure: COLONOSCOPY;  Surgeon: Rogene Houston, MD;  Location: [facility];  Service: Endoscopy;  Laterality: N/A;  240   COLONOSCOPY N/A 04/24/2018   Procedure: COLONOSCOPY;  Surgeon: Rogene Houston, MD;  Location: [facility];  Service: Endoscopy;  Laterality: N/A;  9:30   ESOPHAGOGASTRODUODENOSCOPY N/A 04/24/2018   Procedure: ESOPHAGOGASTRODUODENOSCOPY (EGD);  Surgeon: Rogene Houston, MD;  Location[facility];  Service: Endoscopy;  Laterality: N/A;   EYE SURGERY  skin removal    FRACTURE SURGERY  1952   fractured right arm   Social History:  reports that she has never smoked. She has never used smokeless tobacco. She reports that she does not drink alcohol or use drugs. Patient lives at home  Allergies  Allergen Reactions   Daypro [Oxaprozin]     Unknown reaction   Diclofenac Sodium     Unknown reaction   Effexor [Venlafaxine Hydrochloride]     Unknown reaction   Prozac [Fluoxetine  Hcl]     Unknown reaction   Amitriptyline Rash   Cymbalta [Duloxetine Hcl] Nausea Only   Lipitor [Atorvastatin Calcium] Other (See Comments)    Leg aches   Penicillins Rash    Has patient had a PCN reaction causing immediate rash, facial/tongue/throat swelling, SOB or lightheadedness with hypotension: Yes Has patient had a PCN reaction causing severe rash involving mucus membranes or skin necrosis: No Has patient had a PCN reaction that required hospitalization: No Has patient had a PCN reaction occurring within the last 10 years: No If all of the above answers are "NO", then may proceed with Cephalosporin use.    Savella [Milnacipran Hcl] Rash   Sulfa Antibiotics Rash   Zocor [Simvastatin] Other (See Comments)    Hip pain    Family History  Problem Relation Age of Onset   CVA Mother    Stroke Mother    Osteoporosis Mother    Cancer Father        prostate   Bronchitis Sister    COPD Sister    Early death Brother        died in MVA   Carpal tunnel syndrome Sister    Cancer Brother        throat   Cancer Brother        lung   Cancer Brother        throat   Stroke Brother    Heart attack Brother    Gout Brother    Heart Problems Brother        stents   CAD Brother    Hypertension Brother    Gout Brother    Arthritis Brother    Diabetes Son    Coronary artery disease Neg Hx        No premature   Anesthesia problems Neg Hx    Hypotension Neg Hx    Malignant hyperthermia Neg Hx    Pseudochol deficiency Neg Hx       Prior to Admission medications   Medication Sig Start Date End Date Taking? Authorizing Provider  fluticasone (FLONASE) 50 MCG/ACT nasal spray Place 2 sprays into both nostrils daily. 08/09/18  Yes Chipper Herb, MD  Icosapent Ethyl (VASCEPA) 1 g CAPS Take 2 capsules (2 g total) by mouth 2 (two) times daily. 08/10/18  Yes Chipper Herb, MD  levothyroxine (SYNTHROID, LEVOTHROID) 75 MCG tablet Take 1 tablet (75 mcg total)  by mouth daily. 04/25/18  Yes Chipper Herb, MD  metoprolol succinate (TOPROL-XL) 50 MG 24 hr tablet TAKE 1 TABLET DAILY WITH OR IMMEDIATELY FOLLOWING A MEAL. 07/24/18  Yes Satira Sark, MD  pantoprazole (PROTONIX) 40 MG tablet Take 1 tablet (40 mg total) by mouth daily before breakfast. 04/24/18  Yes Rehman, Mechele Dawley, MD  XARELTO 20 MG TABS tablet TAKE 1 TABLET EVERYDAY WITH SUPPER. 09/12/18  Yes Chipper Herb, MD  acetaminophen (TYLENOL) 500 MG tablet Take 500 mg by mouth daily as needed for moderate pain or  headache.    [provider]  Cholecalciferol (VITAMIN D3) 2000 UNITS TABS Take 2,000 Units by mouth daily.     [provider]    Physical Exam: BP (!) 163/64    Pulse 65    Temp 97.8 F (36.6 C) (Oral)    Resp 16    Ht 5\' 6"  (1.676 m)    Wt 90.7 kg    SpO2 95%    BMI 32.28 kg/m    General: Elderly Caucasian female. Awake and alert and oriented x3. No acute cardiopulmonary distress.   HEENT: Normocephalic atraumatic.  Right and left ears normal in appearance.  Pupils equal, round, reactive to light. Extraocular muscles are intact. Sclerae anicteric and noninjected.  Moist mucosal membranes. No mucosal lesions.   Neck: Neck supple without lymphadenopathy. No carotid bruits. No masses palpated.   Cardiovascular: Regular rate with normal S1-S2 sounds. No murmurs, rubs, gallops auscultated. No JVD.   Respiratory: Good respiratory effort with no wheezes, rales, rhonchi. Lungs clear to auscultation bilaterally.  No accessory muscle use.  Abdomen: Soft, nontender, nondistended. Active bowel sounds. No masses or hepatosplenomegaly   Skin: No rashes, lesions, or ulcerations.  Dry, warm to touch. 2+ dorsalis pedis and radial pulses.  Musculoskeletal: No calf or leg pain. All major joints not erythematous nontender.  No upper or lower joint deformation.  Good ROM.  No contractures   Psychiatric: Intact judgment and insight. Pleasant and cooperative.  Neurologic: No  focal neurological deficits. Strength is 5/5 and symmetric in upper and lower extremities.  Cranial nerves II through XII are grossly intact.           Labs on Admission: I have personally reviewed following labs and imaging studies  CBC: Recent Labs  Lab 09/23/18 1216  WBC 6.8  HGB 13.1  HCT 39.2  MCV 94.2  PLT 809   Basic Metabolic Panel: Recent Labs  Lab 09/23/18 1216  NA 135  K 4.3  CL 96*  CO2 28  GLUCOSE 115*  BUN 9  CREATININE 0.62  CALCIUM 9.0   GFR: Estimated Creatinine Clearance: 62.6 mL/min (by C-G formula based on SCr of 0.62 mg/dL). Liver Function Tests: No results for input(s): AST, ALT, ALKPHOS, BILITOT, PROT, ALBUMIN in the last 168 hours. No results for input(s): LIPASE, AMYLASE in the last 168 hours. No results for input(s): AMMONIA in the last 168 hours. Coagulation Profile: No results for input(s): INR, PROTIME in the last 168 hours. Cardiac Enzymes: Recent Labs  Lab 09/23/18 1216 09/23/18 1452  TROPONINI 0.04* 0.09*   BNP (last 3 results) No results for input(s): PROBNP in the last 8760 hours. HbA1C: No results for input(s): HGBA1C in the last 72 hours. CBG: No results for input(s): GLUCAP in the last 168 hours. Lipid Profile: No results for input(s): CHOL, HDL, LDLCALC, TRIG, CHOLHDL, LDLDIRECT in the last 72 hours. Thyroid Function Tests: No results for input(s): TSH, T4TOTAL, FREET4, T3FREE, THYROIDAB in the last 72 hours. Anemia Panel: No results for input(s): VITAMINB12, FOLATE, FERRITIN, TIBC, IRON, RETICCTPCT in the last 72 hours. Urine analysis:    Component Value Date/Time   COLORURINE COLORLESS (A) 04/06/2017 0017   APPEARANCEUR Clear 08/09/2018 1208   LABSPEC 1.002 (L) 04/06/2017 0017   PHURINE 7.0 04/06/2017 0017   GLUCOSEU Negative 08/09/2018 1208   HGBUR NEGATIVE 04/06/2017 0017   BILIRUBINUR Negative 08/09/2018 [place] 04/06/2017 0017   PROTEINUR Negative 08/09/2018 1208   PROTEINUR NEGATIVE  04/06/2017 0017   UROBILINOGEN  negative 02/14/2014 1419   NITRITE Negative 08/09/2018 1208   NITRITE NEGATIVE 04/06/2017 0017   LEUKOCYTESUR 1+ (A) 08/09/2018 1208   Sepsis Labs: @LABRCNTIP (procalcitonin:4,lacticidven:4) )No results found for this or any previous visit (from the past 240 hour(s)).   Radiological Exams on Admission: Dg Chest Portable 1 View  Result Date: 09/23/2018 CLINICAL DATA:  Chest pain and shortness of breath EXAM: PORTABLE CHEST 1 VIEW COMPARISON:  08/03/2016 FINDINGS: Normal heart size and mediastinal contours. There is no edema, consolidation, effusion, or pneumothorax. No significant osseous finding. IMPRESSION: No evidence of active disease. Electronically Signed   By: Monte Fantasia M.D.   On: 09/23/2018 12:03    EKG: Independently reviewed.  Sinus rhythm.  No acute ST changes.  Assessment/Plan: Active Problems:   Hyperlipidemia   AF (paroxysmal atrial fibrillation) (HCC)   Hypertension   OSA on CPAP   NSTEMI (non-ST elevated myocardial infarction) [facility])    This patient was discussed with the ED physician, including pertinent vitals, physical exam findings, labs, and imaging.  We also discussed care given by the ED provider.  1. NSTEMI a. Admission at [facility] b. Cardiology consult c. Start heparin tomorrow morning per cardiology d. CBC daily 2. Paroxysmal atrial fibrillation a. On Xarelto as patient going to be on heparin in the morning 3. Hypertension a. Continue beta-blocker 4. Hyperlipidemia a. We will start Crestor in hopes that patient will be able to tolerate it 5. Obstructive sleep apnea on CPAP a. Start CPAP  DVT prophylaxis: Heparin tomorrow Consultants: Cardiology Code Status: Full code Family Communication: None Disposition Plan: Should be able to return home   Truett Mainland, DO

## 2018-09-23 NOTE — ED Provider Notes (Signed)
[facility] EMERGENCY DEPARTMENT Provider Note   CSN: [id number] Arrival date & time: 09/23/18  1108    History   Chief Complaint Chief Complaint  Patient presents with   Chest Pain    HPI Jenna [name] is a 83 y.o. female.     HPI Patient presents with chest pain.  States she has had since Monday with today being Saturday.  States will come and go.  Last around 10 minutes.  States that it is not exertional.  States it is on the left side of his chest and will sometimes go to the back.  Not worse with breathing, worse with certain movements.  No fevers or chills.  No cough.  No shortness of breath.  No swelling in her legs.  Has not had pains like this before.  Has a history of atrial fibrillation states this does not feel like that. Past Medical History:  Diagnosis Date   Cataract    DDD (degenerative disc disease)    Fibromyalgia    Herpes zoster    History of skin cancer    Hyperlipidemia    Hypertension    Hypothyroidism    Iron deficiency anemia due to chronic blood loss 04/06/2018   OA (osteoarthritis)    OSA on CPAP 01/16/2018   Mild OSA overall with an AHI of 5.8/h but during REM sleep moderate with an AHI of 28/h.  Oxygen saturations dropped to 76% during respiratory events.  Now on CPAP at 9 cm H2O    Osteoporosis    Palpitations    Pre-diabetes    PUD (peptic ulcer disease)    Skin cancer of nose    [name]    Patient Active Problem List   Diagnosis Date Noted   Iron deficiency anemia due to chronic blood loss 04/06/2018   Guaiac positive stools 03/30/2018   Absolute anemia 03/30/2018   OSA on CPAP 01/16/2018   B12 deficiency anemia 07/15/2017   Right knee pain 01/30/2015   Hypertension    Precordial pain 02/05/2014   Palpitations 02/05/2014   History of shingles 01/11/2014   Pre-diabetes 11/21/2013   Obesity (BMI 30.0-34.9) 11/21/2013   Mixed hypercholesterolemia and hypertriglyceridemia 42/59/5638   Metabolic  syndrome 75/64/3329   Multiple drug allergies 10/23/2013   Statin intolerance 10/23/2013   Osteopenia of the elderly 07/25/2013   Vitamin D deficiency 06/19/2013   Unspecified constipation 04/23/2013   Rectal bleeding 04/23/2013   Gout 02/21/2013   Osteoarthritis 02/21/2013   Fibromyalgia    Hyperlipidemia 11/14/2008    Past Surgical History:  Procedure Laterality Date   ABDOMINAL HYSTERECTOMY     partial   BIOPSY  04/24/2018   Procedure: BIOPSY;  Surgeon: Rogene Houston, MD;  [facility];  Service: Endoscopy;;  gastric body ulcerated surface   BREAST BIOPSY     Right   CATARACT EXTRACTION W/PHACO  10/28/2011   Procedure: CATARACT EXTRACTION PHACO AND INTRAOCULAR LENS PLACEMENT ([place]);  Surgeon: Tonny Branch, MD;  Location: [facility] OR[facility];  Service: Ophthalmology;  Laterality: Right;  CDE 18.38   CATARACT EXTRACTION W/PHACO  02/14/2012   Procedure: CATARACT EXTRACTION PHACO AND INTRAOCULAR LENS PLACEMENT (IOC);  Surgeon: Tonny Branch, MD;  Location: [facility] OR[facility];  Service: Ophthalmology;  Laterality: Left;  CDE 17.20   CESAREAN SECTION     COLONOSCOPY N/A 05/31/2013   Procedure: COLONOSCOPY;  Surgeon: Rogene Houston, MD;  Location: [facility];  Service: Endoscopy;  Laterality: N/A;  240   COLONOSCOPY N/A 04/24/2018  Procedure: COLONOSCOPY;  Surgeon: Rogene Houston, MD;  Location[facility];  Service: Endoscopy;  Laterality: N/A;  9:30   ESOPHAGOGASTRODUODENOSCOPY N/A 04/24/2018   Procedure: ESOPHAGOGASTRODUODENOSCOPY (EGD);  Surgeon: Rogene Houston, MD;  [facility];  Service: Endoscopy;  Laterality: N/A;   EYE SURGERY     skin removal    FRACTURE SURGERY  1952   fractured right arm     OB History   No obstetric history on file.      Home Medications    Prior to Admission medications   Medication Sig Start Date End Date Taking? Authorizing Provider  fluticasone (FLONASE) 50 MCG/ACT nasal spray Place 2 sprays into both nostrils daily.  08/09/18  Yes Chipper Herb, MD  Icosapent Ethyl (VASCEPA) 1 g CAPS Take 2 capsules (2 g total) by mouth 2 (two) times daily. 08/10/18  Yes Chipper Herb, MD  levothyroxine (SYNTHROID, LEVOTHROID) 75 MCG tablet Take 1 tablet (75 mcg total) by mouth daily. 04/25/18  Yes Chipper Herb, MD  metoprolol succinate (TOPROL-XL) 50 MG 24 hr tablet TAKE 1 TABLET DAILY WITH OR IMMEDIATELY FOLLOWING A MEAL. 07/24/18  Yes Satira Sark, MD  pantoprazole (PROTONIX) 40 MG tablet Take 1 tablet (40 mg total) by mouth daily before breakfast. 04/24/18  Yes Rehman, Mechele Dawley, MD  XARELTO 20 MG TABS tablet TAKE 1 TABLET EVERYDAY WITH SUPPER. 09/12/18  Yes Chipper Herb, MD  acetaminophen (TYLENOL) 500 MG tablet Take 500 mg by mouth daily as needed for moderate pain or headache.    [provider]  Cholecalciferol (VITAMIN D3) 2000 UNITS TABS Take 2,000 Units by mouth daily.     [provider]    Family History Family History  Problem Relation Age of Onset   CVA Mother    Stroke Mother    Osteoporosis Mother    Cancer Father        prostate   Bronchitis Sister    COPD Sister    Early death Brother        died in MVA   Carpal tunnel syndrome Sister    Cancer Brother        throat   Cancer Brother        lung   Cancer Brother        throat   Stroke Brother    Heart attack Brother    Gout Brother    Heart Problems Brother        stents   CAD Brother    Hypertension Brother    Gout Brother    Arthritis Brother    Diabetes Son    Coronary artery disease Neg Hx        No premature   Anesthesia problems Neg Hx    Hypotension Neg Hx    Malignant hyperthermia Neg Hx    Pseudochol deficiency Neg Hx     Social History Social History   Tobacco Use   Smoking status: Never Smoker   Smokeless tobacco: Never Used  Substance Use Topics   Alcohol use: No   Drug use: No     Allergies   Daypro [oxaprozin]; Diclofenac sodium; Effexor [venlafaxine  hydrochloride]; Prozac [fluoxetine hcl]; Amitriptyline; Cymbalta [duloxetine hcl]; Lipitor [atorvastatin calcium]; Penicillins; Savella [milnacipran hcl]; Sulfa antibiotics; and Zocor [simvastatin]   Review of Systems Review of Systems  Constitutional: Negative for appetite change, fatigue and fever.  HENT: Negative for congestion.   Respiratory: Negative for cough.   Cardiovascular:  Positive for chest pain.  Gastrointestinal: Negative for abdominal pain.  Genitourinary: Negative for flank pain.  Musculoskeletal: Positive for back pain.  Skin: Negative for rash.  Neurological: Negative for weakness.  Psychiatric/Behavioral: Negative for confusion.     Physical Exam Updated Vital Signs BP (!) 163/64    Pulse 65    Temp 97.8 F (36.6 C) (Oral)    Resp 16    Ht 5\' 6"  (1.676 m)    Wt 90.7 kg    SpO2 95%    BMI 32.28 kg/m   Physical Exam Vitals signs and nursing note reviewed.  HENT:     Head: Atraumatic.  Neck:     Musculoskeletal: Normal range of motion.  Cardiovascular:     Rate and Rhythm: Normal rate and regular rhythm.  Pulmonary:     Breath sounds: No wheezing, rhonchi or rales.     Comments: Tenderness on anterior left chest wall.  No rash. Chest:     Chest wall: No tenderness.  Musculoskeletal:     Right lower leg: Edema present.     Left lower leg: Edema present.  Skin:    Capillary Refill: Capillary refill takes less than 2 seconds.  Neurological:     Mental Status: She is alert.      ED Treatments / Results  Labs (all labs ordered are listed, but only abnormal results are displayed) Labs Reviewed  BASIC METABOLIC PANEL - Abnormal; Notable for the following components:      Result Value   Chloride 96 (*)    Glucose, Bld 115 (*)    All other components within normal limits  TROPONIN I - Abnormal; Notable for the following components:   Troponin I 0.04 (*)    All other components within normal limits  CBC  TROPONIN I    EKG EKG  Interpretation  Date/Time:  Saturday Sep 23 2018 11:18:40 EDT Ventricular Rate:  69 PR Interval:    QRS Duration: 90 QT Interval:  402 QTC Calculation: 431 R Axis:   46 Text Interpretation:  Sinus rhythm Nonspecific T abnormalities, lateral leads Baseline wander in lead(s) I Confirmed by Davonna Belling [phone]) on 09/23/2018 11:33:32 AM   Radiology Dg Chest Portable 1 View  Result Date: 09/23/2018 CLINICAL DATA:  Chest pain and shortness of breath EXAM: PORTABLE CHEST 1 VIEW COMPARISON:  08/03/2016 FINDINGS: Normal heart size and mediastinal contours. There is no edema, consolidation, effusion, or pneumothorax. No significant osseous finding. IMPRESSION: No evidence of active disease. Electronically Signed   By: Monte Fantasia M.D.   On: 09/23/2018 12:03    Procedures Procedures (including critical care time)  Medications Ordered in ED Medications  sodium chloride flush (NS) 0.9 % injection 3 mL (3 mLs Intravenous Not Given 09/23/18 1120)     Initial Impression / Assessment and Plan / ED Course  I have reviewed the triage vital signs and the nursing notes.  Pertinent labs & imaging results that were available during my care of the patient were reviewed by me and considered in my medical decision making (see chart for details).        Patient with chest pain.  Comes and goes.  Had been lasting around 10 minutes but more prolonged today.  Has not come on with exertion.  Is reproducible with pressing on the anterior left chest.  EKG stable.  Initial enzyme was 0.04.  Will repeat.  I think the fact that it has not been with exertion and reproducible makes it more  likely that this could be musculoskeletal.  However will get the delta troponin to evaluate for a change.  Care will be turned over to Dr. Lacinda Axon.  Final Clinical Impressions(s) / ED Diagnoses   Final diagnoses:  Nonspecific chest pain    ED Discharge Orders    None       Davonna Belling, MD 09/23/18 [phone]

## 2018-09-23 NOTE — Telephone Encounter (Signed)
Patient of Dr. Domenic Polite contacted after hour answering service for chest pain.  She says that she first noticed intermittent chest pain in the beginning of this week however it progressed over the past several days.  Last night, she has the worst chest pain.  She took some Tylenol and that the symptoms did not improve.  She denies any exacerbating factors such as deep inspiration, body rotation or palpation.  Her symptom is very concerning and she needs to be evaluated at the local ED immediately to make sure this is noncardiac.  I asked her to hang up the phone and call 911 to have EMS to transport her, she displayed understanding and agreed to proceed to the nearby ED.

## 2018-09-23 NOTE — ED Notes (Signed)
Date and time results received: 09/23/18 1258 (use smartphrase ".now" to insert current time)  Test: trop Critical Value: 0.04   Name of Provider Notified: pickering  Orders Received? Or Actions Taken?: see chart

## 2018-09-23 NOTE — Progress Notes (Addendum)
ANTICOAGULATION CONSULT NOTE - Initial Consult  Pharmacy Consult for Heparin Indication: chest pain/ACS  Allergies  Allergen Reactions  . Daypro [Oxaprozin]     Unknown reaction  . Diclofenac Sodium     Unknown reaction  . Effexor [Venlafaxine Hydrochloride]     Unknown reaction  . Prozac [Fluoxetine Hcl]     Unknown reaction  . Amitriptyline Rash  . Cymbalta [Duloxetine Hcl] Nausea Only  . Lipitor [Atorvastatin Calcium] Other (See Comments)    Leg aches  . Penicillins Rash    Has patient had a PCN reaction causing immediate rash, facial/tongue/throat swelling, SOB or lightheadedness with hypotension: Yes Has patient had a PCN reaction causing severe rash involving mucus membranes or skin necrosis: No Has patient had a PCN reaction that required hospitalization: No Has patient had a PCN reaction occurring within the last 10 years: No If all of the above answers are "NO", then may proceed with Cephalosporin use.   [name] [Milnacipran Hcl] Rash  . Sulfa Antibiotics Rash  . Zocor [Simvastatin] Other (See Comments)    Hip pain    Patient Measurements: Height: 5\' 6"  (167.6 cm) Weight: 200 lb (90.7 kg) IBW/kg (Calculated) : 59.3 HEPARIN DW (KG): 79.1  Vital Signs: Temp: 97.8 F (36.6 C) (05/02 1118) Temp Source: Oral (05/02 1118) BP: 163/64 (05/02 1130) Pulse Rate: 65 (05/02 1145)  Labs: Recent Labs    09/23/18 1216 09/23/18 1452  HGB 13.1  --   HCT 39.2  --   PLT 176  --   CREATININE 0.62  --   TROPONINI 0.04* 0.09*    Estimated Creatinine Clearance: 62.6 mL/min (by C-G formula based on SCr of 0.62 mg/dL).   Medical History: Past Medical History:  Diagnosis Date  . Cataract   . DDD (degenerative disc disease)   . Fibromyalgia   . Herpes zoster   . History of skin cancer   . Hyperlipidemia   . Hypertension   . Hypothyroidism   . Iron deficiency anemia due to chronic blood loss 04/06/2018  . OA (osteoarthritis)   . OSA on CPAP 01/16/2018   Mild OSA  overall with an AHI of 5.8/h but during REM sleep moderate with an AHI of 28/h.  Oxygen saturations dropped to 76% during respiratory events.  Now on CPAP at 9 cm H2O   . Osteoporosis   . Palpitations   . Pre-diabetes   . PUD (peptic ulcer disease)   . Skin cancer of nose    [name]    Medications:  See med rec  Assessment: 83 yo female presented with chest pain. She is chronically anticoagulated with xarelto(last dose 5/1 at 2200) for afib. Will order APTT and HL to monitor d/t likelihood that HL will be affected by xarelto.  Goal of Therapy:  Heparin level 0.3-0.7 units/ml aPTT 66-102 seconds Monitor platelets by anticoagulation protocol: Yes   Plan:  No bolus Start heparin infusion at 950 units/hr at 2200 5/2 Check anti-Xa level in ~8 hours and daily while on heparin Continue to monitor H&H and platelets  Isac Sarna, BS Vena [place], BCPS Clinical Pharmacist Pager [phone] 09/23/2018,5:33 PM

## 2018-09-23 NOTE — ED Notes (Addendum)
carelink arrived to get patient  

## 2018-09-24 ENCOUNTER — Other Ambulatory Visit: Payer: Self-pay

## 2018-09-24 DIAGNOSIS — I214 Non-ST elevation (NSTEMI) myocardial infarction: Secondary | ICD-10-CM

## 2018-09-24 DIAGNOSIS — E669 Obesity, unspecified: Secondary | ICD-10-CM

## 2018-09-24 DIAGNOSIS — I480 Paroxysmal atrial fibrillation: Secondary | ICD-10-CM

## 2018-09-24 LAB — HEPARIN LEVEL (UNFRACTIONATED): Heparin Unfractionated: 0.7 IU/mL (ref 0.30–0.70)

## 2018-09-24 LAB — APTT: aPTT: 60 seconds — ABNORMAL HIGH (ref 24–36)

## 2018-09-24 MED ORDER — HEPARIN (PORCINE) 25000 UT/250ML-% IV SOLN
1100.0000 [IU]/h | INTRAVENOUS | Status: DC
  Administered 2018-09-24: 09:00:00 1000 [IU]/h via INTRAVENOUS
  Administered 2018-09-25: 07:00:00 1100 [IU]/h via INTRAVENOUS
  Filled 2018-09-24 (×2): qty 250

## 2018-09-24 MED ORDER — OMEGA-3-ACID ETHYL ESTERS 1 G PO CAPS
2.0000 g | ORAL_CAPSULE | Freq: Two times a day (BID) | ORAL | Status: DC
  Administered 2018-09-24 (×2): 2 g via ORAL
  Filled 2018-09-24 (×3): qty 2

## 2018-09-24 MED ORDER — SODIUM CHLORIDE 0.9% FLUSH
3.0000 mL | Freq: Two times a day (BID) | INTRAVENOUS | Status: DC
  Administered 2018-09-24: 22:00:00 3 mL via INTRAVENOUS

## 2018-09-24 MED ORDER — SODIUM CHLORIDE 0.9 % WEIGHT BASED INFUSION
1.0000 mL/kg/h | INTRAVENOUS | Status: DC
  Administered 2018-09-24 – 2018-09-25 (×2): 1 mL/kg/h via INTRAVENOUS

## 2018-09-24 MED ORDER — SODIUM CHLORIDE 0.9% FLUSH: 3.0000 mL | INTRAVENOUS | Status: DC | PRN

## 2018-09-24 MED ORDER — SODIUM CHLORIDE 0.9 % IV SOLN: 250.0000 mL | INTRAVENOUS | Status: DC | PRN

## 2018-09-24 NOTE — Progress Notes (Addendum)
Chaplain responded to request for prayer from this delightful woman.  Faith is a deep well of strength for her.  Pt admits that her husband (of 38 years) is more anxious than she is, especially because he can't be present with her.  Additional stress:  Family just lost a nephew last Sunday following an extended illness Myles Lipps); family could not gather for funeral due to Covid virus.  Provided prayer per patient request. Offered a blessing with anointing oil, which pt welcomed. Tradition is [employer].    Recommend offering brief prayer by Chaplain before procedure.   Please contact for follow-up support as needed or requested.  Minus Liberty, Chaplain   09/24/18 1300  Clinical Encounter Type  Visited With Patient  Visit Type Initial;Spiritual support  Referral From Patient  Consult/Referral To Chaplain  Spiritual Encounters  Spiritual Needs Prayer  Stress Factors  Patient Stress Factors  (Upcoming cath)

## 2018-09-24 NOTE — Progress Notes (Signed)
[place] for Heparin Indication: chest pain/ACS  Allergies  Allergen Reactions  . Daypro [Oxaprozin]     Unknown reaction  . Diclofenac Sodium     Unknown reaction  . Effexor [Venlafaxine Hydrochloride]     Unknown reaction  . Prozac [Fluoxetine Hcl]     Unknown reaction  . Amitriptyline Rash  . Cymbalta [Duloxetine Hcl] Nausea Only  . Lipitor [Atorvastatin Calcium] Other (See Comments)    Leg aches  . Penicillins Rash    Has patient had a PCN reaction causing immediate rash, facial/tongue/throat swelling, SOB or lightheadedness with hypotension: Yes Has patient had a PCN reaction causing severe rash involving mucus membranes or skin necrosis: No Has patient had a PCN reaction that required hospitalization: No Has patient had a PCN reaction occurring within the last 10 years: No If all of the above answers are "NO", then may proceed with Cephalosporin use.   [name] [Milnacipran Hcl] Rash  . Sulfa Antibiotics Rash  . Zocor [Simvastatin] Other (See Comments)    Hip pain    Patient Measurements: Height: 5\' 6"  (167.6 cm) Weight: 203 lb 14.4 oz (92.5 kg) IBW/kg (Calculated) : 59.3 HEPARIN DW (KG): 79.1  Vital Signs: Temp: 98 F (36.7 C) (05/03 1431) Temp Source: Oral (05/03 1431) BP: 136/52 (05/03 1431) Pulse Rate: 62 (05/03 1431)  Labs: Recent Labs    09/23/18 1216 09/23/18 1452 09/23/18 1837 09/24/18 1653  HGB 13.1  --   --   --   HCT 39.2  --   --   --   PLT 176  --   --   --   APTT 38*  --   --  60*  HEPARINUNFRC  --   --   --  0.70  CREATININE 0.62  --   --   --   TROPONINI 0.04* 0.09* 0.06*  --     Estimated Creatinine Clearance: 63.2 mL/min (by C-G formula based on SCr of 0.62 mg/dL).   Medications:  See med rec  Assessment: 83 yo female presented with chest pain. She is chronically anticoagulated with xarelto(last dose 5/1 at 2200 per [facility] pharmacist) for afib.  MD wants to start heparin at 0800 this  morning.  Heparin drip was not started at OSH Heparin drip 1000 uts/hr HL 0.7 - falsey elevated from recent Xarelto use - will dose heparin based off of APTT 60 sec close to goal.    Goal of Therapy:  Heparin level 0.3-0.7 units/ml aPTT 66-102 seconds Monitor platelets by anticoagulation protocol: Yes   Plan:  Increase heparin infusion 1100 units/hr  Daily HL, APTT, CBC  Bonnita Nasuti Pharm.D. CPP, BCPS Clinical Pharmacist [phone] 09/24/2018 5:36 PM

## 2018-09-24 NOTE — Progress Notes (Signed)
PROGRESS NOTE    Jenna [name]   CBJ:[id number]  DOB: 1935/12/02  DOA: 09/23/2018 PCP: Chipper Herb, MD   Brief Narrative:  Jenna [name] is a 82 y.o. female with a history of upper lipidemia, hypertension, hypothyroidism, paroxysmal atrial fibrillation on anticoagulation, obstructive sleep apnea on CPAP.  She presented for severe pain in her left chest wall, radiating to her left shoulder and upper back. Initially was intense and constant and subsequently was intermittent.    Subjective: On and off chest pain. No other complaints.     Assessment & Plan:   Principal Problem:   NSTEMI (non-ST elevated myocardial infarction)  Troponin I  0.04 0.09  0.06  - awaiting cath- on Heparin infusion  Active Problems:   Hyperlipidemia - Crestor, Vacepa    AF (paroxysmal atrial fibrillation) - Toprol - Xarelto on hold- now on Heparin infusion    Obesity (BMI 30.0-34.9) Body mass index is 32.91 kg/m.    Hypertension - cont Toprol  Hypothyroid - cont Synthroid    OSA on CPAP   Time spent in minutes:35 DVT prophylaxis: Heparin Code Status: Full code Family Communication:  Disposition Plan: home after cardiac work up Consultants:   cardiology Procedures:    Antimicrobials:  Anti-infectives (From admission, onward)   None       Objective: Vitals:   09/23/18 2000 09/23/18 2130 09/23/18 2200 09/24/18 0607  BP: (!) 185/62 (!) 187/63 (!) 169/65 (!) 135/54  Pulse: 64 70 67 81  Resp: 20 20 20    Temp:    97.9 F (36.6 C)  TempSrc:    Oral  SpO2: 95% 95% 94% 93%  Weight:    92.5 kg  Height:       No intake or output data in the 24 hours ending 09/24/18 1137 Filed Weights   09/23/18 1114 09/24/18 0607  Weight: 90.7 kg 92.5 kg    Examination: General exam: Appears comfortable  HEENT: PERRLA, oral mucosa moist, no sclera icterus or thrush Respiratory system: Clear to auscultation. Respiratory effort normal. Cardiovascular system: S1 &  S2 heard, RRR.   Gastrointestinal system: Abdomen soft, non-tender, nondistended. Normal bowel sounds. Central nervous system: Alert and oriented. No focal neurological deficits. Extremities: No cyanosis, clubbing or edema Skin: No rashes or ulcers Psychiatry:  Mood & affect appropriate.    Data Reviewed: I have personally reviewed following labs and imaging studies  CBC: Recent Labs  Lab 09/23/18 1216  WBC 6.8  HGB 13.1  HCT 39.2  MCV 94.2  PLT 160   Basic Metabolic Panel: Recent Labs  Lab 09/23/18 1216  NA 135  K 4.3  CL 96*  CO2 28  GLUCOSE 115*  BUN 9  CREATININE 0.62  CALCIUM 9.0   GFR: Estimated Creatinine Clearance: 63.2 mL/min (by C-G formula based on SCr of 0.62 mg/dL). Liver Function Tests: No results for input(s): AST, ALT, ALKPHOS, BILITOT, PROT, ALBUMIN in the last 168 hours. No results for input(s): LIPASE, AMYLASE in the last 168 hours. No results for input(s): AMMONIA in the last 168 hours. Coagulation Profile: No results for input(s): INR, PROTIME in the last 168 hours. Cardiac Enzymes: Recent Labs  Lab 09/23/18 1216 09/23/18 1452 09/23/18 1837  TROPONINI 0.04* 0.09* 0.06*   BNP (last 3 results) No results for input(s): PROBNP in the last 8760 hours. HbA1C: No results for input(s): HGBA1C in the last 72 hours. CBG: No results for input(s): GLUCAP in the last 168 hours. Lipid Profile: No results  for input(s): CHOL, HDL, LDLCALC, TRIG, CHOLHDL, LDLDIRECT in the last 72 hours. Thyroid Function Tests: No results for input(s): TSH, T4TOTAL, FREET4, T3FREE, THYROIDAB in the last 72 hours. Anemia Panel: No results for input(s): VITAMINB12, FOLATE, FERRITIN, TIBC, IRON, RETICCTPCT in the last 72 hours. Urine analysis:    Component Value Date/Time   COLORURINE COLORLESS (A) 04/06/2017 0017   APPEARANCEUR Clear 08/09/2018 1208   LABSPEC 1.002 (L) 04/06/2017 0017   PHURINE 7.0 04/06/2017 0017   GLUCOSEU Negative 08/09/2018 1208   HGBUR  NEGATIVE 04/06/2017 0017   BILIRUBINUR Negative 08/09/2018 1208   KETONESUR NEGATIVE 04/06/2017 0017   PROTEINUR Negative 08/09/2018 1208   PROTEINUR NEGATIVE 04/06/2017 0017   UROBILINOGEN negative 02/14/2014 1419   NITRITE Negative 08/09/2018 1208   NITRITE NEGATIVE 04/06/2017 0017   LEUKOCYTESUR 1+ (A) 08/09/2018 1208   Sepsis Labs: @LABRCNTIP (procalcitonin:4,lacticidven:4) )No results found for this or any previous visit (from the past 240 hour(s)).       Radiology Studies: Dg Chest Portable 1 View  Result Date: 09/23/2018 CLINICAL DATA:  Chest pain and shortness of breath EXAM: PORTABLE CHEST 1 VIEW COMPARISON:  08/03/2016 FINDINGS: Normal heart size and mediastinal contours. There is no edema, consolidation, effusion, or pneumothorax. No significant osseous finding. IMPRESSION: No evidence of active disease. Electronically Signed   By: Monte Fantasia M.D.   On: 09/23/2018 12:03      Scheduled Meds: . aspirin EC  81 mg Oral Daily  . fluticasone  2 spray Each Nare Daily  . levothyroxine  75 mcg Oral Q0600  . metoprolol succinate  50 mg Oral Daily  . omega-3 acid ethyl esters  2 g Oral BID  . pantoprazole  40 mg Oral QAC breakfast  . rosuvastatin  20 mg Oral q1800  . sodium chloride flush  3 mL Intravenous Once   Continuous Infusions: . heparin 1,000 Units/hr (09/24/18 0900)     LOS: 1 day      Debbe Odea, MD [facility] Pager: [email or web address] Password [facility] 09/24/2018, 11:37 AM

## 2018-09-24 NOTE — Progress Notes (Signed)
Comfortable  Mild intermittent chest pain overnight (currently pain free)  ekg and labs reviewed  Will plan for cath tomorrow.  Orders placed  Thompson Grayer MD, [facility] 09/24/2018 10:40 AM

## 2018-09-24 NOTE — Progress Notes (Signed)
[place] for Heparin Indication: chest pain/ACS  Allergies  Allergen Reactions  . Daypro [Oxaprozin]     Unknown reaction  . Diclofenac Sodium     Unknown reaction  . Effexor [Venlafaxine Hydrochloride]     Unknown reaction  . Prozac [Fluoxetine Hcl]     Unknown reaction  . Amitriptyline Rash  . Cymbalta [Duloxetine Hcl] Nausea Only  . Lipitor [Atorvastatin Calcium] Other (See Comments)    Leg aches  . Penicillins Rash    Has patient had a PCN reaction causing immediate rash, facial/tongue/throat swelling, SOB or lightheadedness with hypotension: Yes Has patient had a PCN reaction causing severe rash involving mucus membranes or skin necrosis: No Has patient had a PCN reaction that required hospitalization: No Has patient had a PCN reaction occurring within the last 10 years: No If all of the above answers are "NO", then may proceed with Cephalosporin use.   Jenna [name] [Milnacipran Hcl] Rash  . Sulfa Antibiotics Rash  . Zocor [Simvastatin] Other (See Comments)    Hip pain    Patient Measurements: Height: 5\' 6"  (167.6 cm) Weight: 200 lb (90.7 kg) IBW/kg (Calculated) : 59.3 HEPARIN DW (KG): 79.1  Vital Signs: BP: 169/65 (05/02 2200) Pulse Rate: 67 (05/02 2200)  Labs: Recent Labs    09/23/18 1216 09/23/18 1452 09/23/18 1837  HGB 13.1  --   --   HCT 39.2  --   --   PLT 176  --   --   APTT 38*  --   --   CREATININE 0.62  --   --   TROPONINI 0.04* 0.09* 0.06*    Estimated Creatinine Clearance: 62.6 mL/min (by C-G formula based on SCr of 0.62 mg/dL).   Medications:  See med rec  Assessment: 83 yo female presented with chest pain. She is chronically anticoagulated with xarelto(last dose 5/1 at 2200 per [facility] pharmacist) for afib.  MD wants to start heparin at 0800 this morning.  Heparin drip was not started at OSH  Goal of Therapy:  Heparin level 0.3-0.7 units/ml aPTT 66-102 seconds Monitor platelets by anticoagulation  protocol: Yes   Plan:  Start heparin infusion at 1000 units/hr at 08:00 Check anti-Xa level and aPTT ~8 hours after start Continue to monitor H&H and platelets  Thanks for allowing pharmacy to be a part of this patient's care.  Excell Seltzer, PharmD Clinical Pharmacist

## 2018-09-24 NOTE — Consult Note (Addendum)
Cardiology Consult    Patient ID: Jenna [name] MRN: [id number], DOB/AGE: 81-17-1937   Admit date: 09/23/2018 Date of Consult: 09/24/2018  Primary Physician: Chipper Herb, MD Primary Cardiologist: Rozann Lesches, MD   Past Medical History   Past Medical History:  Diagnosis Date   Cataract    DDD (degenerative disc disease)    Fibromyalgia    Herpes zoster    History of skin cancer    Hyperlipidemia    Hypertension    Hypothyroidism    Iron deficiency anemia due to chronic blood loss 04/06/2018   OA (osteoarthritis)    OSA on CPAP 01/16/2018   Mild OSA overall with an AHI of 5.8/h but during REM sleep moderate with an AHI of 28/h.  Oxygen saturations dropped to 76% during respiratory events.  Now on CPAP at 9 cm H2O    Osteoporosis    Palpitations    Pre-diabetes    PUD (peptic ulcer disease)    Skin cancer of nose    [name]    Past Surgical History:  Procedure Laterality Date   ABDOMINAL HYSTERECTOMY     partial   BIOPSY  04/24/2018   Procedure: BIOPSY;  Surgeon: Rogene Houston, MD;  Location: [facility];  Service: Endoscopy;;  gastric body ulcerated surface   BREAST BIOPSY     Right   CATARACT EXTRACTION W/PHACO  10/28/2011   Procedure: CATARACT EXTRACTION PHACO AND INTRAOCULAR LENS PLACEMENT ([place]);  Surgeon: Tonny Branch, MD;  Location: [facility] ORS;  Service: Ophthalmology;  Laterality: Right;  CDE 18.38   CATARACT EXTRACTION W/PHACO  02/14/2012   Procedure: CATARACT EXTRACTION PHACO AND INTRAOCULAR LENS PLACEMENT ([facility]);  Surgeon: Tonny Branch, MD;  Location: [facility] ORS;  Service: Ophthalmology;  Laterality: Left;  CDE 17.20   CESAREAN SECTION     COLONOSCOPY N/A 05/31/2013   Procedure: COLONOSCOPY;  Surgeon: Rogene Houston, MD;  Location: [facility];  Service: Endoscopy;  Laterality: N/A;  240   COLONOSCOPY N/A 04/24/2018   Procedure: COLONOSCOPY;  Surgeon: Rogene Houston, MD;  Location: [facility];  Service: Endoscopy;  Laterality:  N/A;  9:30   ESOPHAGOGASTRODUODENOSCOPY N/A 04/24/2018   Procedure: ESOPHAGOGASTRODUODENOSCOPY (E[facility]);  Surgeon: Rogene Houston, MD;  Location[facility];  Service: Endoscopy;  Laterality: N/A;   EYE SURGERY     skin removal    FRACTURE SURGERY  1952   fractured right arm     Allergies  Allergies  Allergen Reactions   Daypro [Oxaprozin]     Unknown reaction   Diclofenac Sodium     Unknown reaction   Effexor [Venlafaxine Hydrochloride]     Unknown reaction   Prozac [Fluoxetine Hcl]     Unknown reaction   Amitriptyline Rash   Cymbalta [Duloxetine Hcl] Nausea Only   Lipitor [Atorvastatin Calcium] Other (See Comments)    Leg aches   Penicillins Rash    Has patient had a PCN reaction causing immediate rash, facial/tongue/throat swelling, SOB or lightheadedness with hypotension: Yes Has patient had a PCN reaction causing severe rash involving mucus membranes or skin necrosis: No Has patient had a PCN reaction that required hospitalization: No Has patient had a PCN reaction occurring within the last 10 years: No If all of the above answers are "NO", then may proceed with Cephalosporin use.    [place]ella [Milnacipran Hcl] Rash   Sulfa Antibiotics Rash   Zocor [Simvastatin] Other (See Comments)    Hip pain    History of  Present Illness    20y y o woman with OSA, HTN, HL, Afib presents to the hospital with 1 day history of CP. She had for about a week some discomfort in her chest with activity. Then yest. Pain at rest without activity. Radiated in to her left side. She has chronic DOE which she attributes to Afib. No orthopnea or PND. Some LEE at baseline. No recent stressors.   At OSH ED with symptoms on presentation and trop of 0.09. (climbing).   Inpatient Medications     aspirin  324 mg Oral NOW   Or   aspirin  300 mg Rectal NOW   aspirin EC  81 mg Oral Daily   fluticasone  2 spray Each Nare Daily   Icosapent Ethyl  2 capsule Oral BID   levothyroxine   75 mcg Oral Q0600   metoprolol succinate  50 mg Oral Daily   pantoprazole  40 mg Oral QAC breakfast   rosuvastatin  20 mg Oral q1800   sodium chloride flush  3 mL Intravenous Once    Family History    Family History  Problem Relation Age of Onset   CVA Mother    Stroke Mother    Osteoporosis Mother    Cancer Father        prostate   Bronchitis Sister    COPD Sister    Early death Brother        died in MVA   Carpal tunnel syndrome Sister    Cancer Brother        throat   Cancer Brother        lung   Cancer Brother        throat   Stroke Brother    Heart attack Brother    Gout Brother    Heart Problems Brother        stents   CAD Brother    Hypertension Brother    Gout Brother    Arthritis Brother    Diabetes Son    Coronary artery disease Neg Hx        No premature   Anesthesia problems Neg Hx    Hypotension Neg Hx    Malignant hyperthermia Neg Hx    Pseudochol deficiency Neg Hx    She indicated that her mother is deceased. She indicated that her father is deceased. She indicated that both of her sisters are alive. She indicated that two of her six brothers are alive. She indicated that her son is alive. She indicated that the status of her neg hx is unknown.   Social History    Social History   Socioeconomic History   Marital status: Married    Spouse name: [name]   Number of children: 1   Years of education: 9   Highest education level: 9th grade  Occupational History   Occupation: Retired    Comment: Public relations account executive strain: Not hard at [employer] insecurity:    Worry: Never true    Inability: Never true   Transportation needs:    Medical: No    Non-medical: No  Tobacco Use   Smoking status: Never Smoker   Smokeless tobacco: Never Used  Substance and Sexual Activity   Alcohol use: No   Drug use: No   Sexual activity: Yes    Birth  control/protection: Surgical  Lifestyle   Physical activity:    Days per week: 0 days  Minutes per session: 0 min   Stress: Only a little  Relationships   Social connections:    Talks on phone: More than three times a week    Gets together: More than three times a week    Attends religious service: More than 4 times per year    Active member of club or organization: Yes    Attends meetings of clubs or organizations: More than 4 times per year    Relationship status: Married   Intimate partner violence:    Fear of current or ex partner: No    Emotionally abused: No    Physically abused: No    Forced sexual activity: No  Other Topics Concern   Not on file  Social History Narrative   Married   Lives in a one story home      Review of Systems    General:  No chills, fever, night sweats or weight changes.  Cardiovascular:  No chest pain, dyspnea on exertion, edema, orthopnea, palpitations, paroxysmal nocturnal dyspnea. Dermatological: No rash, lesions/masses Respiratory: No cough, dyspnea Urologic: No hematuria, dysuria Abdominal:   No nausea, vomiting, diarrhea, bright red blood per rectum, melena, or hematemesis Neurologic:  No visual changes, wkns, changes in mental status. All other systems reviewed and are otherwise negative except as noted above.  Physical Exam    Blood pressure (!) 169/65, pulse 67, temperature 97.8 F (36.6 C), temperature source Oral, resp. rate 20, height 5\' 6"  (1.676 m), weight 90.7 kg, SpO2 94 %.  General: Pleasant, NAD Psych: Normal affect. Neuro: Alert and oriented X 3. Moves all extremities spontaneously. HEENT: Normal  Neck: Supple without bruits or JVD. Lungs:  Resp regular and unlabored, CTA. Heart: RRR no s3, s4, or murmurs. Abdomen: Soft, non-tender, non-distended, BS + x 4.  Extremities: No clubbing, cyanosis or edema. DP/PT/Radials 2+ and equal bilaterally.  Labs    Troponin (Point of Care Test) No results for input(s):  TROPIPOC in the last 72 hours. Recent Labs    09/23/18 1216 09/23/18 1452 09/23/18 1837  TROPONINI 0.04* 0.09* 0.06*   Lab Results  Component Value Date   WBC 6.8 09/23/2018   HGB 13.1 09/23/2018   HCT 39.2 09/23/2018   MCV 94.2 09/23/2018   PLT 176 09/23/2018    Recent Labs  Lab 09/23/18 1216  NA 135  K 4.3  CL 96*  CO2 28  BUN 9  CREATININE 0.62  CALCIUM 9.0  GLUCOSE 115*   Lab Results  Component Value Date   CHOL 226 (H) 08/09/2018   HDL 61 08/09/2018   LDLCALC 129 (H) 08/09/2018   TRIG 179 (H) 08/09/2018   Lab Results  Component Value Date   DDIMER 0.35 01/21/2014     Radiology Studies    Dg Chest Portable 1 View  Result Date: 09/23/2018 CLINICAL DATA:  Chest pain and shortness of breath EXAM: PORTABLE CHEST 1 VIEW COMPARISON:  08/03/2016 FINDINGS: Normal heart size and mediastinal contours. There is no edema, consolidation, effusion, or pneumothorax. No significant osseous finding. IMPRESSION: No evidence of active disease. Electronically Signed   By: Monte Fantasia M.D.   On: 09/23/2018 12:03    ECG & Cardiac Imaging    NSR without ischemic changes  Assessment & Plan    24 y o woman with Afib, No known CAD presents with NSTEMI. Currently asymptomatic. HDS. Also diagnosis of HFpEF at baseline is likely given her DOE with preserved EF in setting of Afib. Currently in NSR. Especially if  cors are clean then troponin could be attributed to HF.   Recommendation:  - Start on Heparin tomorrow am >24h from last dose of xarelto at that time, - Nitro should she develop CP again - Cont on ASA. Statin already initiated. Cont on metop.  - TTE in am - LHC on Monday   Signed, Cristina Gong, MD 09/24/2018, 12:01 AM  For questions or updates, please contact   Please consult [email or web address] for contact info under Cardiology/STEMI.

## 2018-09-25 ENCOUNTER — Encounter (HOSPITAL_COMMUNITY): Payer: Self-pay | Admitting: Interventional Cardiology

## 2018-09-25 ENCOUNTER — Encounter (HOSPITAL_COMMUNITY)
Admission: EM | Disposition: A | Discharge status: Home / Self Care | Payer: Self-pay | Source: Home / Self Care | Attending: Internal Medicine

## 2018-09-25 DIAGNOSIS — E782 Mixed hyperlipidemia: Secondary | ICD-10-CM

## 2018-09-25 DIAGNOSIS — R7989 Other specified abnormal findings of blood chemistry: Secondary | ICD-10-CM

## 2018-09-25 DIAGNOSIS — R072 Precordial pain: Secondary | ICD-10-CM

## 2018-09-25 DIAGNOSIS — R778 Other specified abnormalities of plasma proteins: Secondary | ICD-10-CM

## 2018-09-25 DIAGNOSIS — I214 Non-ST elevation (NSTEMI) myocardial infarction: Secondary | ICD-10-CM

## 2018-09-25 DIAGNOSIS — R079 Chest pain, unspecified: Secondary | ICD-10-CM

## 2018-09-25 HISTORY — PX: LEFT HEART CATH AND CORONARY ANGIOGRAPHY: CATH118249

## 2018-09-25 LAB — BASIC METABOLIC PANEL
Anion gap: 10 (ref 5–15)
BUN: 10 mg/dL (ref 8–23)
CO2: 27 mmol/L (ref 22–32)
Calcium: 8.7 mg/dL — ABNORMAL LOW (ref 8.9–10.3)
Chloride: 102 mmol/L (ref 98–111)
Creatinine, Ser: 0.64 mg/dL (ref 0.44–1.00)
GFR calc Af Amer: >60 mL/min (ref >60)
GFR calc non Af Amer: >60 mL/min (ref >60)
Glucose, Bld: 116 mg/dL — ABNORMAL HIGH (ref 70–99)
Potassium: 3.9 mmol/L (ref 3.5–5.1)
Sodium: 139 mmol/L (ref 135–145)

## 2018-09-25 LAB — CBC
HCT: 36.4 % (ref 36.0–46.0)
Hemoglobin: 12.4 g/dL (ref 12.0–15.0)
MCH: 31.2 pg (ref 26.0–34.0)
MCHC: 34.1 g/dL (ref 30.0–36.0)
MCV: 91.7 fL (ref 80.0–100.0)
Platelets: 173 10*3/uL (ref 150–400)
RBC: 3.97 MIL/uL (ref 3.87–5.11)
RDW: 12.9 % (ref 11.5–15.5)
WBC: 5.8 10*3/uL (ref 4.0–10.5)
nRBC: 0 % (ref 0.0–0.2)

## 2018-09-25 LAB — HEPARIN LEVEL (UNFRACTIONATED): Heparin Unfractionated: 0.7 IU/mL (ref 0.30–0.70)

## 2018-09-25 LAB — APTT: aPTT: 109 seconds — ABNORMAL HIGH (ref 24–36)

## 2018-09-25 SURGERY — LEFT HEART CATH AND CORONARY ANGIOGRAPHY
Anesthesia: LOCAL

## 2018-09-25 MED ORDER — VERAPAMIL HCL 2.5 MG/ML IV SOLN
INTRAVENOUS | Status: AC
  Filled 2018-09-25: qty 2

## 2018-09-25 MED ORDER — SODIUM CHLORIDE 0.9% FLUSH: 3.0000 mL | INTRAVENOUS | Status: DC | PRN

## 2018-09-25 MED ORDER — HEPARIN (PORCINE) IN NACL 1000-0.9 UT/500ML-% IV SOLN
INTRAVENOUS | Status: AC
  Filled 2018-09-25: qty 1000

## 2018-09-25 MED ORDER — LIDOCAINE HCL (PF) 1 % IJ SOLN
INTRAMUSCULAR | Status: AC
  Filled 2018-09-25: qty 30

## 2018-09-25 MED ORDER — LABETALOL HCL 5 MG/ML IV SOLN: 10.0000 mg | INTRAVENOUS | Status: AC | PRN

## 2018-09-25 MED ORDER — ACETAMINOPHEN 325 MG PO TABS: 650.0000 mg | ORAL_TABLET | ORAL | Status: DC | PRN

## 2018-09-25 MED ORDER — HYDRALAZINE HCL 20 MG/ML IJ SOLN: 10.0000 mg | INTRAMUSCULAR | Status: AC | PRN

## 2018-09-25 MED ORDER — MIDAZOLAM HCL 2 MG/2ML IJ SOLN
INTRAMUSCULAR | Status: DC | PRN
  Administered 2018-09-25: 1 mg via INTRAVENOUS

## 2018-09-25 MED ORDER — SODIUM CHLORIDE 0.9% FLUSH: 3.0000 mL | Freq: Two times a day (BID) | INTRAVENOUS | Status: DC

## 2018-09-25 MED ORDER — FENTANYL CITRATE (PF) 100 MCG/2ML IJ SOLN
INTRAMUSCULAR | Status: AC
  Filled 2018-09-25: qty 2

## 2018-09-25 MED ORDER — LIDOCAINE HCL (PF) 1 % IJ SOLN
INTRAMUSCULAR | Status: DC | PRN
  Administered 2018-09-25: 2 mL

## 2018-09-25 MED ORDER — HEPARIN SODIUM (PORCINE) 1000 UNIT/ML IJ SOLN
INTRAMUSCULAR | Status: DC | PRN
  Administered 2018-09-25: 4500 [IU] via INTRAVENOUS

## 2018-09-25 MED ORDER — MIDAZOLAM HCL 2 MG/2ML IJ SOLN
INTRAMUSCULAR | Status: AC
  Filled 2018-09-25: qty 2

## 2018-09-25 MED ORDER — SODIUM CHLORIDE 0.9 % IV SOLN: 250.0000 mL | INTRAVENOUS | Status: DC | PRN

## 2018-09-25 MED ORDER — VERAPAMIL HCL 2.5 MG/ML IV SOLN
INTRAVENOUS | Status: DC | PRN
  Administered 2018-09-25: 10 mL via INTRA_ARTERIAL

## 2018-09-25 MED ORDER — HEPARIN (PORCINE) IN NACL 1000-0.9 UT/500ML-% IV SOLN
INTRAVENOUS | Status: AC
  Filled 2018-09-25: qty 500

## 2018-09-25 MED ORDER — SODIUM CHLORIDE 0.9 % IV SOLN: INTRAVENOUS | Status: AC

## 2018-09-25 MED ORDER — NITROGLYCERIN 0.4 MG SL SUBL: 0.4000 mg | SUBLINGUAL_TABLET | SUBLINGUAL | 0 refills | Status: DC | PRN

## 2018-09-25 MED ORDER — HEPARIN (PORCINE) IN NACL 1000-0.9 UT/500ML-% IV SOLN
INTRAVENOUS | Status: DC | PRN
  Administered 2018-09-25 (×2): 500 mL

## 2018-09-25 MED ORDER — IOHEXOL 350 MG/ML SOLN
INTRAVENOUS | Status: DC | PRN
  Administered 2018-09-25: 30 mL via INTRA_ARTERIAL

## 2018-09-25 MED ORDER — ONDANSETRON HCL 4 MG/2ML IJ SOLN: 4.0000 mg | Freq: Four times a day (QID) | INTRAMUSCULAR | Status: DC | PRN

## 2018-09-25 MED ORDER — FENTANYL CITRATE (PF) 100 MCG/2ML IJ SOLN
INTRAMUSCULAR | Status: DC | PRN
  Administered 2018-09-25: 25 ug via INTRAVENOUS

## 2018-09-25 SURGICAL SUPPLY — 11 items
CATH 5FR JL3.5 JR4 ANG PIG MP (CATHETERS) ×1 IMPLANT
COVER DOME SNAP 22 D (MISCELLANEOUS) ×1 IMPLANT
DEVICE RAD COMP TR BAND LRG (VASCULAR PRODUCTS) ×1 IMPLANT
GLIDESHEATH SLEND SS 6F .021 (SHEATH) ×1 IMPLANT
GUIDEWIRE INQWIRE 1.5J.035X260 (WIRE) IMPLANT
INQWIRE 1.5J .035X260CM (WIRE) ×2
KIT HEART LEFT (KITS) ×2 IMPLANT
PACK CARDIAC CATHETERIZATION (CUSTOM PROCEDURE TRAY) ×2 IMPLANT
TRANSDUCER W/STOPCOCK (MISCELLANEOUS) ×2 IMPLANT
TUBING CIL FLEX 10 FLL-RA (TUBING) ×2 IMPLANT
WIRE HI TORQ VERSACORE-J 145CM (WIRE) ×1 IMPLANT

## 2018-09-25 NOTE — H&P (View-Only) (Signed)
Progress Note  Patient Name: Jenna [name] Date of Encounter: 09/25/2018  Primary Cardiologist: Rozann Lesches, MD  Subjective   83 year old female who was admitted with chest and shoulder pain.  Her troponin levels have increased-consistent with a non-ST segment elevation myocardial infarction. She has a history of paroxysmal atrial fibrillation and apparently also has diastolic dysfunction while in atrial fibrillation.  Echocardiogram in November, 2019 revealed vigorous left ventricular systolic function.  LV diastolic parameters were normal at that time. No further episodes of chest pain. The chest pain was described as shoulder pain and also intrascapular pain.  The pain lasted for several hours.  Inpatient Medications    Scheduled Meds: . aspirin EC  81 mg Oral Daily  . fluticasone  2 spray Each Nare Daily  . levothyroxine  75 mcg Oral Q0600  . metoprolol succinate  50 mg Oral Daily  . omega-3 acid ethyl esters  2 g Oral BID  . pantoprazole  40 mg Oral QAC breakfast  . rosuvastatin  20 mg Oral q1800  . sodium chloride flush  3 mL Intravenous Once  . sodium chloride flush  3 mL Intravenous Q12H   Continuous Infusions: . sodium chloride    . sodium chloride 1 mL/kg/hr (09/25/18 0740)  . heparin 1,100 Units/hr (09/25/18 0636)   PRN Meds: sodium chloride, acetaminophen, nitroGLYCERIN, ondansetron (ZOFRAN) IV, sodium chloride flush   Vital Signs    Vitals:   09/24/18 0607 09/24/18 1431 09/24/18 2044 09/25/18 0556  BP: (!) 135/54 (!) 136/52 (!) 144/54 140/64  Pulse: 81 62 69 67  Resp:  16  16  Temp: 97.9 F (36.6 C) 98 F (36.7 C) 98.4 F (36.9 C) 98 F (36.7 C)  TempSrc: Oral Oral Oral Oral  SpO2: 93% 95% 92% 92%  Weight: 92.5 kg   92.4 kg  Height:        Intake/Output Summary (Last 24 hours) at 09/25/2018 0814 Last data filed at 09/25/2018 0615 Gross per 24 hour  Intake 1471.67 ml  Output 1 ml  Net 1470.67 ml   Last 3 Weights 09/25/2018 09/24/2018 09/23/2018   Weight (lbs) 203 lb 11.2 oz 203 lb 14.4 oz 200 lb  Weight (kg) 92.398 kg 92.488 kg 90.719 kg      Telemetry    Normal sinus rhythm- Personally Reviewed  ECG     NSR , no new ST or T wave changes  - TWI in V2 - old Personally Reviewed  Physical Exam   GEN: No acute distress.   Neck: No JVD Cardiac: RRR, no murmurs, rubs, or gallops.  Respiratory: Clear to auscultation bilaterally. GI: Soft, nontender, non-distended  MS: No edema; No deformity. Right radial pulse is good  Neuro:  Nonfocal  Psych: Normal affect   Labs    Chemistry Recent Labs  Lab 09/23/18 1216 09/25/18 0453  NA 135 139  K 4.3 3.9  CL 96* 102  CO2 28 27  GLUCOSE 115* 116*  BUN 9 10  CREATININE 0.62 0.64  CALCIUM 9.0 8.7*  GFRNONAA >60 >60  GFRAA >60 >60  ANIONGAP 11 10     Hematology Recent Labs  Lab 09/23/18 1216 09/25/18 0453  WBC 6.8 5.8  RBC 4.16 3.97  HGB 13.1 12.4  HCT 39.2 36.4  MCV 94.2 91.7  MCH 31.5 31.2  MCHC 33.4 34.1  RDW 12.9 12.9  PLT 176 173    Cardiac Enzymes Recent Labs  Lab 09/23/18 1216 09/23/18 1452 09/23/18 1837  TROPONINI 0.04* 0.09* 0.06*  No results for input(s): TROPIPOC in the last 168 hours.   BNPNo results for input(s): BNP, PROBNP in the last 168 hours.   DDimer No results for input(s): DDIMER in the last 168 hours.   Radiology    Dg Chest Portable 1 View  Result Date: 09/23/2018 CLINICAL DATA:  Chest pain and shortness of breath EXAM: PORTABLE CHEST 1 VIEW COMPARISON:  08/03/2016 FINDINGS: Normal heart size and mediastinal contours. There is no edema, consolidation, effusion, or pneumothorax. No significant osseous finding. IMPRESSION: No evidence of active disease. Electronically Signed   By: Monte Fantasia M.D.   On: 09/23/2018 12:03    Cardiac Studies     Patient Profile     83 y.o. female  With PAF, HTN, HLD .   Admitted with intrascapular pain.  Troponin levels are minimally elevated.  She is scheduled for heart cath.  Assessment  & Plan    1.  Non-ST segment elevation myocardial infarction.  The patient presented with episodes of chest pain and shoulder pain.  The pain radiated to her intrascapular area.  Her troponin levels are mildly elevated.  She is scheduled for heart catheterization.  We discussed the risks, benefits, options of heart catheterization.  She understands and agrees to proceed.  2.  Paroxysmal atrial fibrillation: She is currently in normal sinus rhythm.  3.  Hyperlipidemia: Continue rosuvastatin 20 mg a day.     For questions or updates, please contact [place] Please consult [email or web address] for contact info under        Signed, Mertie Moores, MD  09/25/2018, 8:14 AM

## 2018-09-25 NOTE — Discharge Summary (Signed)
Physician Discharge Summary  Jenna [name] GNO:[id number] DOB: 02-Apr-1936 DOA: 09/23/2018  PCP: Chipper Herb, MD  Admit date: 09/23/2018 Discharge date: 09/25/2018  Admitted From: home  Disposition:  home   Discharge Condition:  stable   CODE STATUS:  Full code   Consultations:  cardiology    Discharge Diagnoses:  Principal Problem:   Precordial chest pain Active Problems:   Elevated troponin   Hyperlipidemia   AF (paroxysmal atrial fibrillation) (HCC)   Obesity (BMI 30.0-34.9)   Hypertension   OSA on CPAP      Brief Summary: Jenna Arp Ellingtonis a 83 y.o.femalewith a history of upper lipidemia, hypertension, hypothyroidism, paroxysmal atrial fibrillation on anticoagulation, obstructive sleep apnea on CPAP.  She presented for severe pain in her left chest wall, radiating to her left shoulder and upper back. Initially was intense and constant and subsequently was intermittent.   Hospital Course:  Elevated Troponin/ Chest pain Troponin I  0.04 0.09  0.06  - cardiac cath today reveals non-obstructive CAD Prox LAD and Mid RCA 20 % stenosising cath  - she states that she is still feeling sore where she had the pain (mainly left chest wall, arm and upper back)- I suspect she had a muscle spasm for unknown reasons and have advised heat and massage to those areas  Active Problems:   Hyperlipidemia - cont Crestor, Vacepa    AF (paroxysmal atrial fibrillation) - Toprol - Xarelto on hold and on Heparin infusion while awaiting cath - recommended to resume Xarelto tomorrow    Obesity (BMI 30.0-34.9) Body mass index is 32.91 kg/m.    Hypertension - cont Toprol  Hypothyroid - cont Synthroid    OSA on CPAP    Discharge Exam: Vitals:   09/25/18 1237 09/25/18 1441  BP: (!) 121/50 127/62  Pulse: 68 66  Resp:    Temp: 97.9 F (36.6 C) 97.7 F (36.5 C)  SpO2: 95% 94%   Vitals:   09/25/18 1029 09/25/18 1200 09/25/18 1237 09/25/18 1441   BP: (!) 141/56 (!) 142/78 (!) 121/50 127/62  Pulse: 75  68 66  Resp: 20     Temp:   97.9 F (36.6 C) 97.7 F (36.5 C)  TempSrc:   Oral Oral  SpO2: 99% 95% 95% 94%  Weight:      Height:        General: Pt is alert, awake, not in acute distress Cardiovascular: RRR, S1/S2 +, no rubs, no gallops Chest wall: tender in left chest wall, left upper back and upper arm Respiratory: CTA bilaterally, no wheezing, no rhonchi Abdominal: Soft, NT, ND, bowel sounds + Extremities: no edema, no cyanosis   Discharge Instructions  Discharge Instructions    Diet - low sodium heart healthy   Complete by:  As directed    Increase activity slowly   Complete by:  As directed      Allergies as of 09/25/2018      Reactions   Daypro [oxaprozin]    Unknown reaction   Diclofenac Sodium    Unknown reaction   Effexor [venlafaxine Hydrochloride]    Unknown reaction   Prozac [fluoxetine Hcl]    Unknown reaction   Amitriptyline Rash   Cymbalta [duloxetine Hcl] Nausea Only   Lipitor [atorvastatin Calcium] Other (See Comments)   Leg aches   Penicillins Rash   Has patient had a PCN reaction causing immediate rash, facial/tongue/throat swelling, SOB or lightheadedness with hypotension: Yes Has patient had a PCN reaction causing severe  rash involving mucus membranes or skin necrosis: No Has patient had a PCN reaction that required hospitalization: No Has patient had a PCN reaction occurring within the last 10 years: No If all of the above answers are "NO", then may proceed with Cephalosporin use.   Savella [milnacipran Hcl] Rash   Sulfa Antibiotics Rash   Zocor [simvastatin] Other (See Comments)   Hip pain      Medication List    TAKE these medications   acetaminophen 500 MG tablet Commonly known as:  TYLENOL Take 500 mg by mouth daily as needed for moderate pain or headache.   fluticasone 50 MCG/ACT nasal spray Commonly known as:  FLONASE Place 2 sprays into both nostrils daily.   Icosapent  Ethyl 1 g Caps Commonly known as:  Vascepa Take 2 capsules (2 g total) by mouth 2 (two) times daily.   levothyroxine 75 MCG tablet Commonly known as:  SYNTHROID Take 1 tablet (75 mcg total) by mouth daily.   metoprolol succinate 50 MG 24 hr tablet Commonly known as:  TOPROL-XL TAKE 1 TABLET DAILY WITH OR IMMEDIATELY FOLLOWING A MEAL.   nitroGLYCERIN 0.4 MG SL tablet Commonly known as:  NITROSTAT Place 1 tablet (0.4 mg total) under the tongue every 5 (five) minutes x 3 doses as needed for chest pain.   pantoprazole 40 MG tablet Commonly known as:  PROTONIX Take 1 tablet (40 mg total) by mouth daily before breakfast.   Vitamin D3 50 MCG (2000 UT) Tabs Take 2,000 Units by mouth daily.   Xarelto 20 MG Tabs tablet Generic drug:  rivaroxaban TAKE 1 TABLET EVERYDAY WITH SUPPER.       Allergies  Allergen Reactions  . Daypro [Oxaprozin]     Unknown reaction  . Diclofenac Sodium     Unknown reaction  . Effexor [Venlafaxine Hydrochloride]     Unknown reaction  . Prozac [Fluoxetine Hcl]     Unknown reaction  . Amitriptyline Rash  . Cymbalta [Duloxetine Hcl] Nausea Only  . Lipitor [Atorvastatin Calcium] Other (See Comments)    Leg aches  . Penicillins Rash    Has patient had a PCN reaction causing immediate rash, facial/tongue/throat swelling, SOB or lightheadedness with hypotension: Yes Has patient had a PCN reaction causing severe rash involving mucus membranes or skin necrosis: No Has patient had a PCN reaction that required hospitalization: No Has patient had a PCN reaction occurring within the last 10 years: No If all of the above answers are "NO", then may proceed with Cephalosporin use.   [name] [Milnacipran Hcl] Rash  . Sulfa Antibiotics Rash  . Zocor [Simvastatin] Other (See Comments)    Hip pain     Procedures/Studies: Cardiac cath   Dg Chest Portable 1 View  Result Date: 09/23/2018 CLINICAL DATA:  Chest pain and shortness of breath EXAM: PORTABLE CHEST  1 VIEW COMPARISON:  08/03/2016 FINDINGS: Normal heart size and mediastinal contours. There is no edema, consolidation, effusion, or pneumothorax. No significant osseous finding. IMPRESSION: No evidence of active disease. Electronically Signed   By: Monte Fantasia M.D.   On: 09/23/2018 12:03     The results of significant diagnostics from this hospitalization (including imaging, microbiology, ancillary and laboratory) are listed below for reference.     Microbiology: No results found for this or any previous visit (from the past 240 hour(s)).   Labs: BNP (last 3 results) No results for input(s): BNP in the last 8760 hours. Basic Metabolic Panel: Recent Labs  Lab 09/23/18 1216 09/25/18  0453  NA 135 139  K 4.3 3.9  CL 96* 102  CO2 28 27  GLUCOSE 115* 116*  BUN 9 10  CREATININE 0.62 0.64  CALCIUM 9.0 8.7*   Liver Function Tests: No results for input(s): AST, ALT, ALKPHOS, BILITOT, PROT, ALBUMIN in the last 168 hours. No results for input(s): LIPASE, AMYLASE in the last 168 hours. No results for input(s): AMMONIA in the last 168 hours. CBC: Recent Labs  Lab 09/23/18 1216 09/25/18 0453  WBC 6.8 5.8  HGB 13.1 12.4  HCT 39.2 36.4  MCV 94.2 91.7  PLT 176 173   Cardiac Enzymes: Recent Labs  Lab 09/23/18 1216 09/23/18 1452 09/23/18 1837  TROPONINI 0.04* 0.09* 0.06*   BNP: Invalid input(s): POCBNP CBG: No results for input(s): GLUCAP in the last 168 hours. D-Dimer No results for input(s): DDIMER in the last 72 hours. Hgb A1c No results for input(s): HGBA1C in the last 72 hours. Lipid Profile No results for input(s): CHOL, HDL, LDLCALC, TRIG, CHOLHDL, LDLDIRECT in the last 72 hours. Thyroid function studies No results for input(s): TSH, T4TOTAL, T3FREE, THYROIDAB in the last 72 hours.  Invalid input(s): FREET3 Anemia work up No results for input(s): VITAMINB12, FOLATE, FERRITIN, TIBC, IRON, RETICCTPCT in the last 72 hours. Urinalysis    Component Value Date/Time    COLORURINE COLORLESS (A) 04/06/2017 0017   APPEARANCEUR Clear 08/09/2018 1208   LABSPEC 1.002 (L) 04/06/2017 0017   PHURINE 7.0 04/06/2017 0017   GLUCOSEU Negative 08/09/2018 1208   HGBUR NEGATIVE 04/06/2017 0017   BILIRUBINUR Negative 08/09/2018 1208   KETONESUR NEGATIVE 04/06/2017 0017   PROTEINUR Negative 08/09/2018 1208   PROTEINUR NEGATIVE 04/06/2017 0017   UROBILINOGEN negative 02/14/2014 1419   NITRITE Negative 08/09/2018 1208   NITRITE NEGATIVE 04/06/2017 0017   LEUKOCYTESUR 1+ (A) 08/09/2018 1208   Sepsis Labs Invalid input(s): PROCALCITONIN,  WBC,  LACTICIDVEN Microbiology No results found for this or any previous visit (from the past 240 hour(s)).   Time coordinating discharge in minutes: 65  SIGNED:   Debbe Odea, MD  [facility] 09/25/2018, 3:55 PM Pager   If 7PM-7AM, please contact night-coverage [email or web address] Password [email or web address]

## 2018-09-25 NOTE — Progress Notes (Signed)
Progress Note  Patient Name: Jenna [name] Date of Encounter: 09/25/2018  Primary Cardiologist: Rozann Lesches, MD  Subjective   83 year old female who was admitted with chest and shoulder pain.  Her troponin levels have increased-consistent with a non-ST segment elevation myocardial infarction. She has a history of paroxysmal atrial fibrillation and apparently also has diastolic dysfunction while in atrial fibrillation.  Echocardiogram in November, 2019 revealed vigorous left ventricular systolic function.  LV diastolic parameters were normal at that time. No further episodes of chest pain. The chest pain was described as shoulder pain and also intrascapular pain.  The pain lasted for several hours.  Inpatient Medications    Scheduled Meds: . aspirin EC  81 mg Oral Daily  . fluticasone  2 spray Each Nare Daily  . levothyroxine  75 mcg Oral Q0600  . metoprolol succinate  50 mg Oral Daily  . omega-3 acid ethyl esters  2 g Oral BID  . pantoprazole  40 mg Oral QAC breakfast  . rosuvastatin  20 mg Oral q1800  . sodium chloride flush  3 mL Intravenous Once  . sodium chloride flush  3 mL Intravenous Q12H   Continuous Infusions: . sodium chloride    . sodium chloride 1 mL/kg/hr (09/25/18 0740)  . heparin 1,100 Units/hr (09/25/18 0636)   PRN Meds: sodium chloride, acetaminophen, nitroGLYCERIN, ondansetron (ZOFRAN) IV, sodium chloride flush   Vital Signs    Vitals:   09/24/18 0607 09/24/18 1431 09/24/18 2044 09/25/18 0556  BP: (!) 135/54 (!) 136/52 (!) 144/54 140/64  Pulse: 81 62 69 67  Resp:  16  16  Temp: 97.9 F (36.6 C) 98 F (36.7 C) 98.4 F (36.9 C) 98 F (36.7 C)  TempSrc: Oral Oral Oral Oral  SpO2: 93% 95% 92% 92%  Weight: 92.5 kg   92.4 kg  Height:        Intake/Output Summary (Last 24 hours) at 09/25/2018 0814 Last data filed at 09/25/2018 0615 Gross per 24 hour  Intake 1471.67 ml  Output 1 ml  Net 1470.67 ml   Last 3 Weights 09/25/2018 09/24/2018 09/23/2018   Weight (lbs) 203 lb 11.2 oz 203 lb 14.4 oz 200 lb  Weight (kg) 92.398 kg 92.488 kg 90.719 kg      Telemetry    Normal sinus rhythm- Personally Reviewed  ECG     NSR , no new ST or T wave changes  - TWI in V2 - old Personally Reviewed  Physical Exam   GEN: No acute distress.   Neck: No JVD Cardiac: RRR, no murmurs, rubs, or gallops.  Respiratory: Clear to auscultation bilaterally. GI: Soft, nontender, non-distended  MS: No edema; No deformity. Right radial pulse is good  Neuro:  Nonfocal  Psych: Normal affect   Labs    Chemistry Recent Labs  Lab 09/23/18 1216 09/25/18 0453  NA 135 139  K 4.3 3.9  CL 96* 102  CO2 28 27  GLUCOSE 115* 116*  BUN 9 10  CREATININE 0.62 0.64  CALCIUM 9.0 8.7*  GFRNONAA >60 >60  GFRAA >60 >60  ANIONGAP 11 10     Hematology Recent Labs  Lab 09/23/18 1216 09/25/18 0453  WBC 6.8 5.8  RBC 4.16 3.97  HGB 13.1 12.4  HCT 39.2 36.4  MCV 94.2 91.7  MCH 31.5 31.2  MCHC 33.4 34.1  RDW 12.9 12.9  PLT 176 173    Cardiac Enzymes Recent Labs  Lab 09/23/18 1216 09/23/18 1452 09/23/18 1837  TROPONINI 0.04* 0.09* 0.06*  No results for input(s): TROPIPOC in the last 168 hours.   BNPNo results for input(s): BNP, PROBNP in the last 168 hours.   DDimer No results for input(s): DDIMER in the last 168 hours.   Radiology    Dg Chest Portable 1 View  Result Date: 09/23/2018 CLINICAL DATA:  Chest pain and shortness of breath EXAM: PORTABLE CHEST 1 VIEW COMPARISON:  08/03/2016 FINDINGS: Normal heart size and mediastinal contours. There is no edema, consolidation, effusion, or pneumothorax. No significant osseous finding. IMPRESSION: No evidence of active disease. Electronically Signed   By: Monte Fantasia M.D.   On: 09/23/2018 12:03    Cardiac Studies     Patient Profile     83 y.o. female  With PAF, HTN, HLD .   Admitted with intrascapular pain.  Troponin levels are minimally elevated.  She is scheduled for heart cath.  Assessment  & Plan    1.  Non-ST segment elevation myocardial infarction.  The patient presented with episodes of chest pain and shoulder pain.  The pain radiated to her intrascapular area.  Her troponin levels are mildly elevated.  She is scheduled for heart catheterization.  We discussed the risks, benefits, options of heart catheterization.  She understands and agrees to proceed.  2.  Paroxysmal atrial fibrillation: She is currently in normal sinus rhythm.  3.  Hyperlipidemia: Continue rosuvastatin 20 mg a day.     For questions or updates, please contact [place] Please consult [email or web address] for contact info under        Signed, Mertie Moores, MD  09/25/2018, 8:14 AM

## 2018-09-25 NOTE — Progress Notes (Signed)
Patient declined to use CPAP at this time.

## 2018-09-25 NOTE — Interval H&P Note (Signed)
Cath Lab Visit (complete for each Cath Lab visit)  Clinical Evaluation Leading to the Procedure:   ACS: Yes.    Non-ACS:    Anginal Classification: CCS IV  Anti-ischemic medical therapy: Minimal Therapy (1 class of medications)  Non-Invasive Test Results: No non-invasive testing performed  Prior CABG: No previous CABG      History and Physical Interval Note:  09/25/2018 10:12 AM  Jenna [name]  has presented today for surgery, with the diagnosis of unstable angina.  The various methods of treatment have been discussed with the patient and family. After consideration of risks, benefits and other options for treatment, the patient has consented to  Procedure(s): LEFT HEART CATH AND CORONARY ANGIOGRAPHY (N/A) as a surgical intervention.  The patient's history has been reviewed, patient examined, no change in status, stable for surgery.  I have reviewed the patient's chart and labs.  Questions were answered to the patient's satisfaction.     Larae Grooms

## 2018-09-27 ENCOUNTER — Other Ambulatory Visit: Payer: Self-pay

## 2018-09-27 ENCOUNTER — Ambulatory Visit (INDEPENDENT_AMBULATORY_CARE_PROVIDER_SITE_OTHER): Payer: Medicare Other | Admitting: Family Medicine

## 2018-09-27 ENCOUNTER — Encounter: Payer: Self-pay | Admitting: Family Medicine

## 2018-09-27 DIAGNOSIS — B029 Zoster without complications: Secondary | ICD-10-CM | POA: Diagnosis not present

## 2018-09-27 MED ORDER — VALACYCLOVIR HCL 1 G PO TABS: 1000.0000 mg | ORAL_TABLET | Freq: Two times a day (BID) | ORAL | 0 refills | Status: DC

## 2018-09-27 NOTE — Progress Notes (Signed)
Virtual Visit via telephone Note  I connected with Jenna [name] on 09/27/18 at 1654 by telephone and verified that I am speaking with the correct person using two identifiers. Jenna [name] is currently located at home and no other people are currently with her during visit. The provider, Fransisca Kaufmann Dettinger, MD is located in their office at time of visit.  Call ended at 1702  I discussed the limitations, risks, security and privacy concerns of performing an evaluation and management service by telephone and the availability of in person appointments. I also discussed with the patient that there may be a patient responsible charge related to this service. The patient expressed understanding and agreed to proceed.   History and Present Illness: Patient has a rash that just under breast around to breast and she has had some burning.  She denies any pain.  She just noticed it yesterday and then today. She had a friend that told her it looked like shingles.  She denies any fevers or chills or shortness of breath or wheezing.  She had chest pains and was evaluated 3 days ago.  She says it is only on the lung right side and does not cross over to the other side.  No diagnosis found.  Outpatient Encounter Medications as of 09/27/2018  Medication Sig  . acetaminophen (TYLENOL) 500 MG tablet Take 500 mg by mouth daily as needed for moderate pain or headache.  . Cholecalciferol (VITAMIN D3) 2000 UNITS TABS Take 2,000 Units by mouth daily.   . fluticasone (FLONASE) 50 MCG/ACT nasal spray Place 2 sprays into both nostrils daily.  Vanessa Kick Ethyl (VASCEPA) 1 g CAPS Take 2 capsules (2 g total) by mouth 2 (two) times daily.  [name] levothyroxine (SYNTHROID, LEVOTHROID) 75 MCG tablet Take 1 tablet (75 mcg total) by mouth daily.  . metoprolol succinate (TOPROL-XL) 50 MG 24 hr tablet TAKE 1 TABLET DAILY WITH OR IMMEDIATELY FOLLOWING A MEAL.  . nitroGLYCERIN (NITROSTAT) 0.4 MG SL tablet Place 1 tablet (0.4  mg total) under the tongue every 5 (five) minutes x 3 doses as needed for chest pain.  . pantoprazole (PROTONIX) 40 MG tablet Take 1 tablet (40 mg total) by mouth daily before breakfast.  . XARELTO 20 MG TABS tablet TAKE 1 TABLET EVERYDAY WITH SUPPER.   Facility-Administered Encounter Medications as of 09/27/2018  Medication  . cyanocobalamin ((VITAMIN B-12)) injection 1,000 mcg    Review of Systems  Constitutional: Negative for chills and fever.  HENT: Negative for ear pain.   Eyes: Negative for visual disturbance.  Respiratory: Negative for chest tightness and shortness of breath.   Cardiovascular: Positive for chest pain. Negative for leg swelling.  Musculoskeletal: Negative for back pain and gait problem.  Skin: Positive for rash.  Neurological: Negative for light-headedness and headaches.  Psychiatric/Behavioral: Negative for agitation and behavioral problems.  All other systems reviewed and are negative.   Observations/Objective: Patient sounds comfortable and in no acute distress  Assessment and Plan: Problem List Items Addressed This Visit    None    Visit Diagnoses    Herpes zoster without complication    -  Primary   Relevant Medications   valACYclovir (VALTREX) 1000 MG tablet       Follow Up Instructions:  As needed    I discussed the assessment and treatment plan with the patient. The patient was provided an opportunity to ask questions and all were answered. The patient agreed with the plan and demonstrated an understanding  of the instructions.   The patient was advised to call back or seek an in-person evaluation if the symptoms worsen or if the condition fails to improve as anticipated.  The above assessment and management plan was discussed with the patient. The patient verbalized understanding of and has agreed to the management plan. Patient is aware to call the clinic if symptoms persist or worsen. Patient is aware when to return to the clinic for a  follow-up visit. Patient educated on when it is appropriate to go to the emergency department.    I provided 8 minutes of non-face-to-face time during this encounter.    Worthy Rancher, MD

## 2018-09-29 ENCOUNTER — Ambulatory Visit (INDEPENDENT_AMBULATORY_CARE_PROVIDER_SITE_OTHER): Payer: Medicare Other | Admitting: Family Medicine

## 2018-09-29 ENCOUNTER — Other Ambulatory Visit: Payer: Self-pay

## 2018-09-29 ENCOUNTER — Encounter: Payer: Self-pay | Admitting: Family Medicine

## 2018-09-29 DIAGNOSIS — H66002 Acute suppurative otitis media without spontaneous rupture of ear drum, left ear: Secondary | ICD-10-CM

## 2018-09-29 MED ORDER — DOXYCYCLINE HYCLATE 100 MG PO TABS: 100.0000 mg | ORAL_TABLET | Freq: Two times a day (BID) | ORAL | 0 refills | Status: AC

## 2018-09-29 NOTE — Progress Notes (Signed)
Virtual Visit via telephone Note Due to COVID-19, visit is conducted virtually and was requested by patient. This visit type was conducted due to national recommendations for restrictions regarding the COVID-19 Pandemic (e.g. social distancing) in an effort to limit this patient's exposure and mitigate transmission in our community. All issues noted in this document were discussed and addressed.  A physical exam was not performed with this format.   I connected with Jenna [name] on 09/29/18 at 0950 by telephone and verified that I am speaking with the correct person using two identifiers. Jenna [name] is currently located at home and family is currently with them during visit. The provider, Monia Pouch, FNP is located in their office at time of visit.  I discussed the limitations, risks, security and privacy concerns of performing an evaluation and management service by telephone and the availability of in person appointments. I also discussed with the patient that there may be a patient responsible charge related to this service. The patient expressed understanding and agreed to proceed.  Subjective:  Patient ID: Jenna [name], female    DOB: [birth year], 83 y.o.   MRN: [id number]  Chief Complaint:  No chief complaint on file.   HPI: Jenna [name] is a 83 y.o. female presenting on 09/29/2018 for No chief complaint on file.   Pt reports left ear pain that started 3-4 days ago. States the pain is throbbing and aching, 3/10. She denies fever but has chills. No dizziness, headaches, sore throat, or sinus pressure. No drainage or cough. She has recently been diagnosed with shingles involving her left torso. She denies a rash to her face, neck, or scalp. States the rash is only on her back and breast. She denies visual disturbances, rash to ear or ear canal, or changes in hearing.     Relevant past medical, surgical, family, and social history reviewed and updated as indicated.   Allergies and medications reviewed and updated.   Past Medical History:  Diagnosis Date  . Cataract   . DDD (degenerative disc disease)   . Fibromyalgia   . Herpes zoster   . History of skin cancer   . Hyperlipidemia   . Hypertension   . Hypothyroidism   . Iron deficiency anemia due to chronic blood loss 04/06/2018  . OA (osteoarthritis)   . OSA on CPAP 01/16/2018   Mild OSA overall with an AHI of 5.8/h but during REM sleep moderate with an AHI of 28/h.  Oxygen saturations dropped to 76% during respiratory events.  Now on CPAP at 9 cm H2O   . Osteoporosis   . Palpitations   . Pre-diabetes   . PUD (peptic ulcer disease)   . Skin cancer of nose    Jenna [name]    Past Surgical History:  Procedure Laterality Date  . ABDOMINAL HYSTERECTOMY     partial  . BIOPSY  04/24/2018   Procedure: BIOPSY;  Surgeon: Rogene Houston, MD;  Location: [facility];  Service: Endoscopy;;  gastric body ulcerated surface  . BREAST BIOPSY     Right  . CATARACT EXTRACTION W/PHACO  10/28/2011   Procedure: CATARACT EXTRACTION PHACO AND INTRAOCULAR LENS PLACEMENT (IOC);  Surgeon: Tonny Branch, MD;  Location: [facility];  Service: Ophthalmology;  Laterality: Right;  CDE 18.38  . CATARACT EXTRACTION W/PHACO  02/14/2012   Procedure: CATARACT EXTRACTION PHACO AND INTRAOCULAR LENS PLACEMENT (IOC);  Surgeon: Tonny Branch, MD;  Location: [facility];  Service: Ophthalmology;  Laterality: Left;  CDE  17.20  . CESAREAN SECTION    . COLONOSCOPY N/A 05/31/2013   Procedure: COLONOSCOPY;  Surgeon: Rogene Houston, MD;  Location: [facility];  Service: Endoscopy;  Laterality: N/A;  240  . COLONOSCOPY N/A 04/24/2018   Procedure: COLONOSCOPY;  Surgeon: Rogene Houston, MD;  Location: [facility];  Service: Endoscopy;  Laterality: N/A;  9:30  . ESOPHAGOGASTRODUODENOSCOPY N/A 04/24/2018   Procedure: ESOPHAGOGASTRODUODENOSCOPY (EGD);  Surgeon: Rogene Houston, MD;  Location: [facility];  Service: Endoscopy;  Laterality: N/A;  .  EYE SURGERY     skin removal   . FRACTURE SURGERY  1952   fractured right arm  . LEFT HEART CATH AND CORONARY ANGIOGRAPHY N/A 09/25/2018   Procedure: LEFT HEART CATH AND CORONARY ANGIOGRAPHY;  Surgeon: Jettie Booze, MD;  Location: [facility];  Service: Cardiovascular;  Laterality: N/A;    Social History   Socioeconomic History  . Marital status: Married    Spouse name: [name]  . Number of children: 1  . Years of education: 8  . Highest education level: 9th grade  Occupational History  . Occupation: Retired    Comment: Therapist, art  Social Needs  . Financial resource strain: Not hard at all  . Food insecurity:    Worry: Never true    Inability: Never true  . Transportation needs:    Medical: No    Non-medical: No  Tobacco Use  . Smoking status: Never Smoker  . Smokeless tobacco: Never Used  Substance and Sexual Activity  . Alcohol use: No  . Drug use: No  . Sexual activity: Yes    Birth control/protection: Surgical  Lifestyle  . Physical activity:    Days per week: 0 days    Minutes per session: 0 min  . Stress: Only a little  Relationships  . Social connections:    Talks on phone: More than three times a week    Gets together: More than three times a week    Attends religious service: More than 4 times per year    Active member of club or organization: Yes    Attends meetings of clubs or organizations: More than 4 times per year    Relationship status: Married  . Intimate partner violence:    Fear of current or ex partner: No    Emotionally abused: No    Physically abused: No    Forced sexual activity: No  Other Topics Concern  . Not on file  Social History Narrative   Married   Lives in a one story home     Outpatient Encounter Medications as of 09/29/2018  Medication Sig  . acetaminophen (TYLENOL) 500 MG tablet Take 500 mg by mouth daily as needed for moderate pain or headache.  . Cholecalciferol (VITAMIN D3) 2000 UNITS  TABS Take 2,000 Units by mouth daily.   [name] doxycycline (VIBRA-TABS) 100 MG tablet Take 1 tablet (100 mg total) by mouth 2 (two) times daily for 10 days. 1 po bid  . fluticasone (FLONASE) 50 MCG/ACT nasal spray Place 2 sprays into both nostrils daily.  Jenna [name] (VASCEPA) 1 g CAPS Take 2 capsules (2 g total) by mouth 2 (two) times daily.  [name] levothyroxine (SYNTHROID, LEVOTHROID) 75 MCG tablet Take 1 tablet (75 mcg total) by mouth daily.  . metoprolol succinate (TOPROL-XL) 50 MG 24 hr tablet TAKE 1 TABLET DAILY WITH OR IMMEDIATELY FOLLOWING A MEAL.  . nitroGLYCERIN (NITROSTAT) 0.4 MG SL tablet Place  1 tablet (0.4 mg total) under the tongue every 5 (five) minutes x 3 doses as needed for chest pain.  . pantoprazole (PROTONIX) 40 MG tablet Take 1 tablet (40 mg total) by mouth daily before breakfast.  . valACYclovir (VALTREX) 1000 MG tablet Take 1 tablet (1,000 mg total) by mouth 2 (two) times daily.  Alveda Reasons 20 MG TABS tablet TAKE 1 TABLET EVERYDAY WITH SUPPER.   Facility-Administered Encounter Medications as of 09/29/2018  Medication  . cyanocobalamin ((VITAMIN B-12)) injection 1,000 mcg    Allergies  Allergen Reactions  . Daypro [Oxaprozin]     Unknown reaction  . Diclofenac Sodium     Unknown reaction  . Effexor [Venlafaxine Hydrochloride]     Unknown reaction  . Prozac [Fluoxetine Hcl]     Unknown reaction  . Amitriptyline Rash  . Cymbalta [Duloxetine Hcl] Nausea Only  . Lipitor [Atorvastatin Calcium] Other (See Comments)    Leg aches  . Penicillins Rash    Has patient had a PCN reaction causing immediate rash, facial/tongue/throat swelling, SOB or lightheadedness with hypotension: Yes Has patient had a PCN reaction causing severe rash involving mucus membranes or skin necrosis: No Has patient had a PCN reaction that required hospitalization: No Has patient had a PCN reaction occurring within the last 10 years: No If all of the above answers are "NO", then may proceed with  Cephalosporin use.   [name] [Milnacipran Hcl] Rash  . Sulfa Antibiotics Rash  . Zocor [Simvastatin] Other (See Comments)    Hip pain    Review of Systems  Constitutional: Positive for chills. Negative for fatigue, fever and unexpected weight change.  HENT: Positive for ear pain. Negative for congestion, ear discharge, facial swelling, hearing loss, mouth sores, postnasal drip, rhinorrhea, sinus pressure, sinus pain, sore throat and tinnitus.   Eyes: Negative for photophobia, pain, discharge, redness, itching and visual disturbance.  Respiratory: Negative for cough and shortness of breath.   Cardiovascular: Negative for chest pain, palpitations and leg swelling.  Musculoskeletal: Negative for neck pain and neck stiffness.  Skin: Positive for rash.  Neurological: Negative for dizziness, weakness, light-headedness and headaches.  Psychiatric/Behavioral: Negative for confusion.  All other systems reviewed and are negative.        Observations/Objective: No vital signs or physical exam, this was a telephone or virtual health encounter.  Pt alert and oriented, answers all questions appropriately, and able to speak in full sentences.    Assessment and Plan: Diagnoses and all orders for this visit:  Non-recurrent acute suppurative otitis media of left ear without spontaneous rupture of tympanic membrane Reported symptoms consistent with acute otitis media. Will treat with below due to allergy to PCN. Report any new or worsening symptoms.  -     doxycycline (VIBRA-TABS) 100 MG tablet; Take 1 tablet (100 mg total) by mouth 2 (two) times daily for 10 days. 1 po bid     Follow Up Instructions: Return if symptoms worsen or fail to improve.    I discussed the assessment and treatment plan with the patient. The patient was provided an opportunity to ask questions and all were answered. The patient agreed with the plan and demonstrated an understanding of the instructions.   The  patient was advised to call back or seek an in-person evaluation if the symptoms worsen or if the condition fails to improve as anticipated.  The above assessment and management plan was discussed with the patient. The patient verbalized understanding of and has agreed to the  management plan. Patient is aware to call the clinic if symptoms persist or worsen. Patient is aware when to return to the clinic for a follow-up visit. Patient educated on when it is appropriate to go to the emergency department.    I provided 15 minutes of non-face-to-face time during this encounter. The call started at 0950. The call ended at 1005. The other time was used for coordination of care.    Monia Pouch, FNP-C [facility] [address] [place], [place] [address] [phone]

## 2018-10-09 ENCOUNTER — Encounter: Payer: Self-pay | Admitting: Family Medicine

## 2018-10-09 ENCOUNTER — Ambulatory Visit (INDEPENDENT_AMBULATORY_CARE_PROVIDER_SITE_OTHER): Payer: Medicare Other | Admitting: Family Medicine

## 2018-10-09 ENCOUNTER — Other Ambulatory Visit: Payer: Self-pay

## 2018-10-09 DIAGNOSIS — B029 Zoster without complications: Secondary | ICD-10-CM | POA: Diagnosis not present

## 2018-10-09 MED ORDER — LIDOCAINE 5 % EX OINT: 1.0000 "application " | TOPICAL_OINTMENT | CUTANEOUS | 0 refills | Status: DC | PRN

## 2018-10-09 MED ORDER — VALACYCLOVIR HCL 1 G PO TABS: 1000.0000 mg | ORAL_TABLET | Freq: Two times a day (BID) | ORAL | 0 refills | Status: DC

## 2018-10-09 MED ORDER — GABAPENTIN 100 MG PO CAPS: 100.0000 mg | ORAL_CAPSULE | Freq: Three times a day (TID) | ORAL | 3 refills | Status: DC

## 2018-10-09 NOTE — Progress Notes (Signed)
Virtual Visit via telephone Note  I connected with Jenna [name] on 10/09/18 at 1702 by telephone and verified that I am speaking with the correct person using two identifiers. Jenna [name] is currently located at home and no other people are currently with her during visit. The provider, Fransisca Kaufmann Lucelia Lacey, MD is located in their office at time of visit.  Call ended at 1709  I discussed the limitations, risks, security and privacy concerns of performing an evaluation and management service by telephone and the availability of in person appointments. I also discussed with the patient that there may be a patient responsible charge related to this service. The patient expressed understanding and agreed to proceed.   History and Present Illness: Patient finished valtrex and shingles is starting to dry up on back but the ones on the front are just cropping. She says it starts on her back bone and comes around under her armpit and goes a little bit down the inside of her arm and then comes up onto her breast on that right side.  It does not cross over.  Says it looks like little pustules and her husband said the ones on the back are drying up.  She just feels like it is getting worse and spreading more despite the Valtrex  No diagnosis found.  Outpatient Encounter Medications as of 10/09/2018  Medication Sig  . acetaminophen (TYLENOL) 500 MG tablet Take 500 mg by mouth daily as needed for moderate pain or headache.  . Cholecalciferol (VITAMIN D3) 2000 UNITS TABS Take 2,000 Units by mouth daily.   [name] doxycycline (VIBRA-TABS) 100 MG tablet Take 1 tablet (100 mg total) by mouth 2 (two) times daily for 10 days. 1 po bid  . fluticasone (FLONASE) 50 MCG/ACT nasal spray Place 2 sprays into both nostrils daily.  Vanessa Kick Ethyl (VASCEPA) 1 g CAPS Take 2 capsules (2 g total) by mouth 2 (two) times daily.  [name] levothyroxine (SYNTHROID, LEVOTHROID) 75 MCG tablet Take 1 tablet (75 mcg total) by mouth  daily.  . metoprolol succinate (TOPROL-XL) 50 MG 24 hr tablet TAKE 1 TABLET DAILY WITH OR IMMEDIATELY FOLLOWING A MEAL.  . nitroGLYCERIN (NITROSTAT) 0.4 MG SL tablet Place 1 tablet (0.4 mg total) under the tongue every 5 (five) minutes x 3 doses as needed for chest pain.  . pantoprazole (PROTONIX) 40 MG tablet Take 1 tablet (40 mg total) by mouth daily before breakfast.  . valACYclovir (VALTREX) 1000 MG tablet Take 1 tablet (1,000 mg total) by mouth 2 (two) times daily.  Alveda Reasons 20 MG TABS tablet TAKE 1 TABLET EVERYDAY WITH SUPPER.   Facility-Administered Encounter Medications as of 10/09/2018  Medication  . cyanocobalamin ((VITAMIN B-12)) injection 1,000 mcg    Review of Systems  Constitutional: Negative for chills and fever.  Respiratory: Negative for chest tightness and shortness of breath.   Cardiovascular: Negative for chest pain and leg swelling.  Skin: Positive for rash.  All other systems reviewed and are negative.   Observations/Objective: Patient sounds comfortable and in no acute distress  Assessment and Plan: Problem List Items Addressed This Visit    None    Visit Diagnoses    Herpes zoster without complication       Relevant Medications   lidocaine (XYLOCAINE) 5 % ointment   valACYclovir (VALTREX) 1000 MG tablet   gabapentin (NEURONTIN) 100 MG capsule       Follow Up Instructions: Follow-up as needed    I discussed the assessment  and treatment plan with the patient. The patient was provided an opportunity to ask questions and all were answered. The patient agreed with the plan and demonstrated an understanding of the instructions.   The patient was advised to call back or seek an in-person evaluation if the symptoms worsen or if the condition fails to improve as anticipated.  The above assessment and management plan was discussed with the patient. The patient verbalized understanding of and has agreed to the management plan. Patient is aware to call the  clinic if symptoms persist or worsen. Patient is aware when to return to the clinic for a follow-up visit. Patient educated on when it is appropriate to go to the emergency department.    I provided 7 minutes of non-face-to-face time during this encounter.    Worthy Rancher, MD

## 2018-10-19 ENCOUNTER — Other Ambulatory Visit: Payer: Self-pay

## 2018-10-20 ENCOUNTER — Ambulatory Visit: Payer: Medicare Other | Admitting: *Deleted

## 2018-10-20 DIAGNOSIS — E538 Deficiency of other specified B group vitamins: Secondary | ICD-10-CM

## 2018-10-24 ENCOUNTER — Other Ambulatory Visit (INDEPENDENT_AMBULATORY_CARE_PROVIDER_SITE_OTHER): Payer: Self-pay | Admitting: Internal Medicine

## 2018-10-25 NOTE — Telephone Encounter (Signed)
Patient will need to have appointment prior to further refills. She may see Terri.

## 2018-10-26 ENCOUNTER — Telehealth: Payer: Self-pay | Admitting: Family Medicine

## 2018-10-26 NOTE — Telephone Encounter (Signed)
Pt called and she has shingles  Done a visit with Dr Dettinger in may --- had these 5 weeks.  Taking the gabapentin regularly Used the salve that was Rx'd  Tried lotions and coconut oil Ice   Nothing is helping - pain is bad - what else can we try??

## 2018-10-27 ENCOUNTER — Ambulatory Visit: Payer: Medicare Other | Admitting: Cardiology

## 2018-10-27 NOTE — Telephone Encounter (Signed)
Patient aware and verbalizes understanding. 

## 2018-10-27 NOTE — Telephone Encounter (Signed)
Have her double up on the gabapentin and take 2 of the tablets 3 times a day

## 2018-10-30 ENCOUNTER — Other Ambulatory Visit: Payer: Self-pay

## 2018-10-30 ENCOUNTER — Inpatient Hospital Stay (HOSPITAL_COMMUNITY): Payer: Medicare Other | Attending: Hematology

## 2018-10-30 DIAGNOSIS — Z7901 Long term (current) use of anticoagulants: Secondary | ICD-10-CM | POA: Insufficient documentation

## 2018-10-30 DIAGNOSIS — E785 Hyperlipidemia, unspecified: Secondary | ICD-10-CM | POA: Diagnosis not present

## 2018-10-30 DIAGNOSIS — Z85828 Personal history of other malignant neoplasm of skin: Secondary | ICD-10-CM | POA: Insufficient documentation

## 2018-10-30 DIAGNOSIS — Z7951 Long term (current) use of inhaled steroids: Secondary | ICD-10-CM | POA: Insufficient documentation

## 2018-10-30 DIAGNOSIS — G4733 Obstructive sleep apnea (adult) (pediatric): Secondary | ICD-10-CM | POA: Insufficient documentation

## 2018-10-30 DIAGNOSIS — D500 Iron deficiency anemia secondary to blood loss (chronic): Secondary | ICD-10-CM

## 2018-10-30 DIAGNOSIS — D508 Other iron deficiency anemias: Secondary | ICD-10-CM | POA: Diagnosis not present

## 2018-10-30 DIAGNOSIS — E039 Hypothyroidism, unspecified: Secondary | ICD-10-CM | POA: Insufficient documentation

## 2018-10-30 DIAGNOSIS — Z79899 Other long term (current) drug therapy: Secondary | ICD-10-CM | POA: Diagnosis not present

## 2018-10-30 DIAGNOSIS — I10 Essential (primary) hypertension: Secondary | ICD-10-CM | POA: Insufficient documentation

## 2018-10-30 LAB — COMPREHENSIVE METABOLIC PANEL
ALT: 14 U/L (ref 0–44)
AST: 15 U/L (ref 15–41)
Albumin: 3.8 g/dL (ref 3.5–5.0)
Alkaline Phosphatase: 72 U/L (ref 38–126)
Anion gap: 7 (ref 5–15)
BUN: 11 mg/dL (ref 8–23)
CO2: 30 mmol/L (ref 22–32)
Calcium: 8.9 mg/dL (ref 8.9–10.3)
Chloride: 97 mmol/L — ABNORMAL LOW (ref 98–111)
Creatinine, Ser: 0.7 mg/dL (ref 0.44–1.00)
GFR calc Af Amer: >60 mL/min (ref >60)
GFR calc non Af Amer: >60 mL/min (ref >60)
Glucose, Bld: 95 mg/dL (ref 70–99)
Potassium: 4.4 mmol/L (ref 3.5–5.1)
Sodium: 134 mmol/L — ABNORMAL LOW (ref 135–145)
Total Bilirubin: 0.5 mg/dL (ref 0.3–1.2)
Total Protein: 6.4 g/dL — ABNORMAL LOW (ref 6.5–8.1)

## 2018-10-30 LAB — CBC WITH DIFFERENTIAL/PLATELET
Abs Immature Granulocytes: 0.03 10*3/uL (ref 0.00–0.07)
Basophils Absolute: 0.1 10*3/uL (ref 0.0–0.1)
Basophils Relative: 1 %
Eosinophils Absolute: 0.1 10*3/uL (ref 0.0–0.5)
Eosinophils Relative: 2 %
HCT: 38.6 % (ref 36.0–46.0)
Hemoglobin: 13 g/dL (ref 12.0–15.0)
Immature Granulocytes: 1 %
Lymphocytes Relative: 19 %
Lymphs Abs: 1.1 10*3/uL (ref 0.7–4.0)
MCH: 32.2 pg (ref 26.0–34.0)
MCHC: 33.7 g/dL (ref 30.0–36.0)
MCV: 95.5 fL (ref 80.0–100.0)
Monocytes Absolute: 0.7 10*3/uL (ref 0.1–1.0)
Monocytes Relative: 11 %
Neutro Abs: 4.1 10*3/uL (ref 1.7–7.7)
Neutrophils Relative %: 66 %
Platelets: 195 10*3/uL (ref 150–400)
RBC: 4.04 MIL/uL (ref 3.87–5.11)
RDW: 14.1 % (ref 11.5–15.5)
WBC: 6 10*3/uL (ref 4.0–10.5)
nRBC: 0 % (ref 0.0–0.2)

## 2018-10-30 LAB — FERRITIN: Ferritin: 24 ng/mL (ref 11–307)

## 2018-10-30 LAB — LACTATE DEHYDROGENASE: LDH: 125 U/L (ref 98–192)

## 2018-11-01 NOTE — Progress Notes (Signed)
For review.  Please update ordering provider

## 2018-11-06 ENCOUNTER — Inpatient Hospital Stay (HOSPITAL_BASED_OUTPATIENT_CLINIC_OR_DEPARTMENT_OTHER): Payer: Medicare Other | Admitting: Hematology

## 2018-11-06 ENCOUNTER — Encounter (HOSPITAL_COMMUNITY): Payer: Self-pay | Admitting: Hematology

## 2018-11-06 ENCOUNTER — Other Ambulatory Visit: Payer: Self-pay

## 2018-11-06 DIAGNOSIS — E785 Hyperlipidemia, unspecified: Secondary | ICD-10-CM | POA: Diagnosis not present

## 2018-11-06 DIAGNOSIS — E039 Hypothyroidism, unspecified: Secondary | ICD-10-CM

## 2018-11-06 DIAGNOSIS — Z7951 Long term (current) use of inhaled steroids: Secondary | ICD-10-CM | POA: Diagnosis not present

## 2018-11-06 DIAGNOSIS — Z7901 Long term (current) use of anticoagulants: Secondary | ICD-10-CM

## 2018-11-06 DIAGNOSIS — I10 Essential (primary) hypertension: Secondary | ICD-10-CM | POA: Diagnosis not present

## 2018-11-06 DIAGNOSIS — D508 Other iron deficiency anemias: Secondary | ICD-10-CM

## 2018-11-06 DIAGNOSIS — Z79899 Other long term (current) drug therapy: Secondary | ICD-10-CM

## 2018-11-06 DIAGNOSIS — G4733 Obstructive sleep apnea (adult) (pediatric): Secondary | ICD-10-CM | POA: Diagnosis not present

## 2018-11-06 DIAGNOSIS — Z85828 Personal history of other malignant neoplasm of skin: Secondary | ICD-10-CM

## 2018-11-06 DIAGNOSIS — D500 Iron deficiency anemia secondary to blood loss (chronic): Secondary | ICD-10-CM

## 2018-11-06 NOTE — Assessment & Plan Note (Signed)
1. Iron deficiency anemia:  - Initial evaluation: 03/15/2018 that were reviewed and showed WBC 5.6 HB 8.9 plts 267,000.  Ferritin: 12.   -Treated with IV iron on 04/19/2018.   -Pt had EGD and colonoscopy done on 04/24/2018: negative for malignancy. -Labs today: Hemoglobin: 13.0 g/dL, MCV: 95.5, ferritin 24. Labs are completely stable. Recommend continued follow up with PCP. She will RTC in 1 year with repeat labs.   2.  Heme + stools.  - 04/24/2018: Colonoscopy was negative for bleed or malignancy.

## 2018-11-06 NOTE — Patient Instructions (Signed)
Exam and discussion today with Harriet Pho, NP. Return as scheduled for lab work and follow-up.

## 2018-11-06 NOTE — Progress Notes (Signed)
[facility] [place], [place] [address]   CLINIC:  Medical Oncology/Hematology  PCP:  Chipper Herb, [place] [address] [place] [address] [phone]   REASON FOR VISIT:  Follow-up for IDA  CURRENT THERAPY: Clinical Surveillance    INTERVAL HISTORY:  Jenna [name] 83 y.o. female presents today for follow up. She reports overall doing well. She denies any significant fatigue. She denies any episodes of spontaneous bleeding. Denies any SOB,chest pain, light headedness or dizziness. Denies any abdominal pain, change in bowel habits, or weight loss. She states she was recently diagnosed with shingles and has been on treatment for this.  Overall, she is doing well. She is here for repeat labs and office visit.    REVIEW OF SYSTEMS:  Review of Systems  Constitutional: Negative.   HENT:  Negative.   Eyes: Negative.   Respiratory: Negative.   Cardiovascular: Negative.   Gastrointestinal: Negative.   Endocrine: Negative.   Genitourinary: Negative.    Musculoskeletal: Negative.   Skin: Negative.   Neurological: Negative.   Hematological: Negative.   Psychiatric/Behavioral: Negative.      PAST MEDICAL/SURGICAL HISTORY:  Past Medical History:  Diagnosis Date  . Cataract   . DDD (degenerative disc disease)   . Fibromyalgia   . Herpes zoster   . History of skin cancer   . Hyperlipidemia   . Hypertension   . Hypothyroidism   . Iron deficiency anemia due to chronic blood loss 04/06/2018  . OA (osteoarthritis)   . OSA on CPAP 01/16/2018   Mild OSA overall with an AHI of 5.8/h but during REM sleep moderate with an AHI of 28/h.  Oxygen saturations dropped to 76% during respiratory events.  Now on CPAP at 9 cm H2O   . Osteoporosis   . Palpitations   . Pre-diabetes   . PUD (peptic ulcer disease)   . Skin cancer of nose    Jenna [name]   Past Surgical History:  Procedure Laterality Date  . ABDOMINAL HYSTERECTOMY     partial  . BIOPSY   04/24/2018   Procedure: BIOPSY;  Surgeon: Rogene Houston, MD;  Location: [facility];  Service: Endoscopy;;  gastric body ulcerated surface  . BREAST BIOPSY     Right  . CATARACT EXTRACTION W/PHACO  10/28/2011   Procedure: CATARACT EXTRACTION PHACO AND INTRAOCULAR LENS PLACEMENT (IOC);  Surgeon: Tonny Branch, MD;  Location: [facility];  Service: Ophthalmology;  Laterality: Right;  CDE 18.38  . CATARACT EXTRACTION W/PHACO  02/14/2012   Procedure: CATARACT EXTRACTION PHACO AND INTRAOCULAR LENS PLACEMENT (IOC);  Surgeon: Tonny Branch, MD;  Location: [facility];  Service: Ophthalmology;  Laterality: Left;  CDE 17.20  . CESAREAN SECTION    . COLONOSCOPY N/A 05/31/2013   Procedure: COLONOSCOPY;  Surgeon: Rogene Houston, MD;  Location: [facility];  Service: Endoscopy;  Laterality: N/A;  240  . COLONOSCOPY N/A 04/24/2018   Procedure: COLONOSCOPY;  Surgeon: Rogene Houston, MD;  [facility]: [facility];  Service: Endoscopy;  Laterality: N/A;  9:30  . ESOPHAGOGASTRODUODENOSCOPY N/A 04/24/2018   Procedure: ESOPHAGOGASTRODUODENOSCOPY (EGD);  Surgeon: Rogene Houston, MD;  Location: [facility];  Service: Endoscopy;  Laterality: N/A;  . EYE SURGERY     skin removal   . FRACTURE SURGERY  1952   fractured right arm  . LEFT HEART CATH AND CORONARY ANGIOGRAPHY N/A 09/25/2018   Procedure: LEFT HEART CATH AND CORONARY ANGIOGRAPHY;  Surgeon: Jettie Booze, MD;  Location: [facility];  Service: Cardiovascular;  Laterality: N/A;     SOCIAL HISTORY:  Social History   Socioeconomic History  . Marital status: Married    Spouse name: [name]  . Number of children: 1  . Years of education: 108  . Highest education level: 9th grade  Occupational History  . Occupation: Retired    Comment: Therapist, art  Social Needs  . Financial resource strain: Not hard at all  . Food insecurity    Worry: Never true    Inability: Never true  . Transportation needs    Medical: No    Non-medical: No   Tobacco Use  . Smoking status: Never Smoker  . Smokeless tobacco: Never Used  Substance and Sexual Activity  . Alcohol use: No  . Drug use: No  . Sexual activity: Yes    Birth control/protection: Surgical  Lifestyle  . Physical activity    Days per week: 0 days    Minutes per session: 0 min  . Stress: Only a little  Relationships  . Social connections    Talks on phone: More than three times a week    Gets together: More than three times a week    Attends religious service: More than 4 times per year    Active member of club or organization: Yes    Attends meetings of clubs or organizations: More than 4 times per year    Relationship status: Married  . Intimate partner violence    Fear of current or ex partner: No    Emotionally abused: No    Physically abused: No    Forced sexual activity: No  Other Topics Concern  . Not on file  Social History Narrative   Married   Lives in a one story home     FAMILY HISTORY:  Family History  Problem Relation Age of Onset  . CVA Mother   . Stroke Mother   . Osteoporosis Mother   . Cancer Father        prostate  . Bronchitis Sister   . COPD Sister   . Early death Brother        died in [place]  . Carpal tunnel syndrome Sister   . Cancer Brother        throat  . Cancer Brother        lung  . Cancer Brother        throat  . Stroke Brother   . Heart attack Brother   . Gout Brother   . Heart Problems Brother        stents  . CAD Brother   . Hypertension Brother   . Gout Brother   . Arthritis Brother   . Diabetes Son   . Coronary artery disease Neg Hx        No premature  . Anesthesia problems Neg Hx   . Hypotension Neg Hx   . Malignant hyperthermia Neg Hx   . Pseudochol deficiency Neg Hx     CURRENT MEDICATIONS:  Outpatient Encounter Medications as of 11/06/2018  Medication Sig  . acetaminophen (TYLENOL) 500 MG tablet Take 500 mg by mouth daily as needed for moderate pain or headache.  . Cholecalciferol (VITAMIN D3)  2000 UNITS TABS Take 2,000 Units by mouth daily.   . fluticasone (FLONASE) 50 MCG/ACT nasal spray Place 2 sprays into both nostrils daily.  [name] gabapentin (NEURONTIN) 100 MG capsule Take 1 capsule (100 mg total) by mouth 3 (  three) times daily.  Vanessa Kick Ethyl (VASCEPA) 1 g CAPS Take 2 capsules (2 g total) by mouth 2 (two) times daily.  [name] levothyroxine (SYNTHROID, LEVOTHROID) 75 MCG tablet Take 1 tablet (75 mcg total) by mouth daily.  [name] lidocaine (XYLOCAINE) 5 % ointment Apply 1 application topically as needed.  . metoprolol succinate (TOPROL-XL) 50 MG 24 hr tablet TAKE 1 TABLET DAILY WITH OR IMMEDIATELY FOLLOWING A MEAL.  . nitroGLYCERIN (NITROSTAT) 0.4 MG SL tablet Place 1 tablet (0.4 mg total) under the tongue every 5 (five) minutes x 3 doses as needed for chest pain.  . pantoprazole (PROTONIX) 40 MG tablet TAKE 1 TABLET BY MOUTH DAILY BEFORE BREAKFAST.  [name] XARELTO 20 MG TABS tablet TAKE 1 TABLET EVERYDAY WITH SUPPER.  . [DISCONTINUED] valACYclovir (VALTREX) 1000 MG tablet Take 1 tablet (1,000 mg total) by mouth 2 (two) times daily.   Facility-Administered Encounter Medications as of 11/06/2018  Medication  . cyanocobalamin ((VITAMIN B-12)) injection 1,000 mcg    ALLERGIES:  Allergies  Allergen Reactions  . Daypro [Oxaprozin]     Unknown reaction  . Diclofenac Sodium     Unknown reaction  . Effexor [Venlafaxine Hydrochloride]     Unknown reaction  . Prozac [Fluoxetine Hcl]     Unknown reaction  . Amitriptyline Rash  . Cymbalta [Duloxetine Hcl] Nausea Only  . Lipitor [Atorvastatin Calcium] Other (See Comments)    Leg aches  . Penicillins Rash    Has patient had a PCN reaction causing immediate rash, facial/tongue/throat swelling, SOB or lightheadedness with hypotension: Yes Has patient had a PCN reaction causing severe rash involving mucus membranes or skin necrosis: No Has patient had a PCN reaction that required hospitalization: No Has patient had a PCN reaction occurring within  the last 10 years: No If all of the above answers are "NO", then may proceed with Cephalosporin use.   [name] [Milnacipran Hcl] Rash  . Sulfa Antibiotics Rash  . Zocor [Simvastatin] Other (See Comments)    Hip pain     PHYSICAL EXAM:  ECOG Performance status: 1  Vitals:   11/06/18 1305  BP: (!) 155/64  Pulse: 70  Resp: 18  Temp: 98.3 F (36.8 C)  SpO2: 95%   Filed Weights   11/06/18 1305  Weight: 207 lb (93.9 kg)    Physical Exam Constitutional:      Appearance: Normal appearance. She is obese.  HENT:     Nose: Nose normal.     Mouth/Throat:     Mouth: Mucous membranes are moist.     Pharynx: Oropharynx is clear.  Eyes:     Conjunctiva/sclera: Conjunctivae normal.  Neck:     Musculoskeletal: Normal range of motion.  Cardiovascular:     Rate and Rhythm: Normal rate and regular rhythm.     Pulses: Normal pulses.     Heart sounds: Normal heart sounds.  Pulmonary:     Effort: Pulmonary effort is normal.     Breath sounds: Normal breath sounds.  Abdominal:     General: Bowel sounds are normal.     Palpations: Abdomen is soft.  Musculoskeletal: Normal range of motion.  Skin:    General: Skin is warm and dry.  Neurological:     General: No focal deficit present.     Mental Status: She is alert and oriented to person, place, and time.  Psychiatric:        Mood and Affect: Mood normal.        Behavior: Behavior normal.  Thought Content: Thought content normal.        Judgment: Judgment normal.      LABORATORY DATA:  I have reviewed the labs as listed.  CBC    Component Value Date/Time   WBC 6.0 10/30/2018 1300   RBC 4.04 10/30/2018 1300   HGB 13.0 10/30/2018 1300   HGB 13.3 08/09/2018 1309   HCT 38.6 10/30/2018 1300   HCT 40.4 08/09/2018 1309   PLT 195 10/30/2018 1300   PLT 223 08/09/2018 1309   MCV 95.5 10/30/2018 1300   MCV 92 08/09/2018 1309   MCH 32.2 10/30/2018 1300   MCHC 33.7 10/30/2018 1300   RDW 14.1 10/30/2018 1300   RDW 13.9  08/09/2018 1309   LYMPHSABS 1.1 10/30/2018 1300   LYMPHSABS 1.3 08/09/2018 1309   MONOABS 0.7 10/30/2018 1300   EOSABS 0.1 10/30/2018 1300   EOSABS 0.1 08/09/2018 1309   BASOSABS 0.1 10/30/2018 1300   BASOSABS 0.1 08/09/2018 1309   CMP Latest Ref Rng & Units 10/30/2018 09/25/2018 09/23/2018  Glucose 70 - 99 mg/dL 95 116(H) 115(H)  BUN 8 - 23 mg/dL 11 10 9   Creatinine 0.44 - 1.00 mg/dL 0.70 0.64 0.62  Sodium 135 - 145 mmol/L 134(L) 139 135  Potassium 3.5 - 5.1 mmol/L 4.4 3.9 4.3  Chloride 98 - 111 mmol/L 97(L) 102 96(L)  CO2 22 - 32 mmol/L 30 27 28   Calcium 8.9 - 10.3 mg/dL 8.9 8.7(L) 9.0  Total Protein 6.5 - 8.1 g/dL 6.4(L) - -  Total Bilirubin 0.3 - 1.2 mg/dL 0.5 - -  Alkaline Phos 38 - 126 U/L 72 - -  AST 15 - 41 U/L 15 - -  ALT 0 - 44 U/L 14 - -        ASSESSMENT & PLAN:  Iron deficiency anemia due to chronic blood loss 1. Iron deficiency anemia:  - Initial evaluation: 03/15/2018 that were reviewed and showed WBC 5.6 HB 8.9 plts 267,000.  Ferritin: 12.   -Treated with IV iron on 04/19/2018.   -Pt had EGD and colonoscopy done on 04/24/2018: negative for malignancy. -Labs today: Hemoglobin: 13.0 g/dL, MCV: 95.5, ferritin 24. Labs are completely stable. Recommend continued follow up with PCP. She will RTC in 1 year with repeat labs.   2.  Heme + stools.  - 04/24/2018: Colonoscopy was negative for bleed or malignancy.       Orders placed this encounter:  Orders Placed This Encounter  Procedures  . CBC with Differential  . Comprehensive metabolic panel  . Iron and TIBC  . Ferritin  . Vitamin B12  . [place], [place] [phone]

## 2018-11-17 ENCOUNTER — Other Ambulatory Visit: Payer: Self-pay

## 2018-11-20 ENCOUNTER — Ambulatory Visit (INDEPENDENT_AMBULATORY_CARE_PROVIDER_SITE_OTHER): Payer: Medicare Other | Admitting: *Deleted

## 2018-11-20 ENCOUNTER — Other Ambulatory Visit: Payer: Self-pay

## 2018-11-20 DIAGNOSIS — E538 Deficiency of other specified B group vitamins: Secondary | ICD-10-CM

## 2018-11-21 ENCOUNTER — Other Ambulatory Visit: Payer: Self-pay | Admitting: *Deleted

## 2018-11-21 MED ORDER — FUROSEMIDE 40 MG PO TABS: 40.0000 mg | ORAL_TABLET | Freq: Every day | ORAL | 3 refills | Status: DC

## 2018-11-21 NOTE — Telephone Encounter (Signed)
Pt is requesting Furosemide 40 mg (one a day)  And hasn't had in awhile - not in med list - do you want to approve this?

## 2018-12-01 ENCOUNTER — Other Ambulatory Visit: Payer: Self-pay

## 2018-12-01 ENCOUNTER — Encounter: Payer: Self-pay | Admitting: Family

## 2018-12-01 ENCOUNTER — Ambulatory Visit (INDEPENDENT_AMBULATORY_CARE_PROVIDER_SITE_OTHER): Payer: Medicare Other | Admitting: Family

## 2018-12-01 ENCOUNTER — Ambulatory Visit (HOSPITAL_COMMUNITY)
Admission: RE | Admit: 2018-12-01 | Discharge: 2018-12-01 | Disposition: A | Discharge status: Home / Self Care | Payer: Medicare Other | Source: Ambulatory Visit | Attending: Family | Admitting: Family

## 2018-12-01 VITALS — BP 154/80 | HR 69 | Temp 97.1°F | Ht 66.0 in | Wt 209.6 lb

## 2018-12-01 DIAGNOSIS — R609 Edema, unspecified: Secondary | ICD-10-CM

## 2018-12-01 DIAGNOSIS — M7989 Other specified soft tissue disorders: Secondary | ICD-10-CM | POA: Insufficient documentation

## 2018-12-01 DIAGNOSIS — R936 Abnormal findings on diagnostic imaging of limbs: Secondary | ICD-10-CM | POA: Diagnosis not present

## 2018-12-01 DIAGNOSIS — R600 Localized edema: Secondary | ICD-10-CM | POA: Diagnosis not present

## 2018-12-01 DIAGNOSIS — W19XXXA Unspecified fall, initial encounter: Secondary | ICD-10-CM | POA: Diagnosis not present

## 2018-12-01 DIAGNOSIS — Y92009 Unspecified place in unspecified non-institutional (private) residence as the place of occurrence of the external cause: Secondary | ICD-10-CM | POA: Insufficient documentation

## 2018-12-01 DIAGNOSIS — M79672 Pain in left foot: Secondary | ICD-10-CM | POA: Diagnosis not present

## 2018-12-01 DIAGNOSIS — S99922A Unspecified injury of left foot, initial encounter: Secondary | ICD-10-CM | POA: Diagnosis not present

## 2018-12-01 NOTE — Patient Instructions (Signed)
Peripheral Edema  Peripheral edema is swelling that is caused by a buildup of fluid. Peripheral edema most often affects the lower legs, ankles, and feet. It can also develop in the arms, hands, and face. The area of the body that has peripheral edema will look swollen. It may also feel heavy or warm. Your clothes may start to feel tight. Pressing on the area may make a temporary dent in your skin. You may not be able to move your swollen arm or leg as much as usual. There are many causes of peripheral edema. It can happen because of a complication of other conditions such as congestive heart failure, kidney disease, or a problem with your blood circulation. It also can be a side effect of certain medicines or because of an infection. It often happens to women during pregnancy. Sometimes, the cause is not known. Follow these instructions at home: Managing pain, stiffness, and swelling   Raise (elevate) your legs while you are sitting or lying down.  Move around often to prevent stiffness and to lessen swelling.  Do not sit or stand for long periods of time.  Wear support stockings as told by your health care provider. Medicines  Take over-the-counter and prescription medicines only as told by your health care provider.  Your health care provider may prescribe medicine to help your body get rid of excess water (diuretic). General instructions  Pay attention to any changes in your symptoms.  Follow instructions from your health care provider about limiting salt (sodium) in your diet. Sometimes, eating less salt may reduce swelling.  Moisturize skin daily to help prevent skin from cracking and draining.  Keep all follow-up visits as told by your health care provider. This is important. Contact a health care provider if you have:  A fever.  Edema that starts suddenly or is getting worse, especially if you are pregnant or have a medical condition.  Swelling in only one leg.  Increased  swelling, redness, or pain in one or both of your legs.  Drainage or sores at the area where you have edema. Get help right away if you:  Develop shortness of breath, especially when you are lying down.  Have pain in your chest or abdomen.  Feel weak.  Feel faint. Summary  Peripheral edema is swelling that is caused by a buildup of fluid. Peripheral edema most often affects the lower legs, ankles, and feet.  Move around often to prevent stiffness and to lessen swelling. Do not sit or stand for long periods of time.  Pay attention to any changes in your symptoms.  Contact a health care provider if you have edema that starts suddenly or is getting worse, especially if you are pregnant or have a medical condition.  Get help right away if you develop shortness of breath, especially when lying down. This information is not intended to replace advice given to you by your health care provider. Make sure you discuss any questions you have with your health care provider. Document Released: 06/17/2004 Document Revised: 02/01/2018 Document Reviewed: 02/01/2018 Elsevier Patient Education  2020 [employer].

## 2018-12-01 NOTE — Progress Notes (Signed)
Subjective:    Patient ID: Jenna [name], female    DOB: 1935/10/30, 83 y.o.   MRN: [id number]  Chief Complaint  Patient presents with  . left swollen ankle due to fall   Pt presents to the office today with left ankle pain that occurred after a fall a week ago.  Fall The accident occurred 5 to 7 days ago. The fall occurred while standing. She landed on dirt. There was no blood loss. The point of impact was the left hip and left foot. The pain is present in the left foot. The pain is at a severity of 6/10. The pain is moderate. Pertinent negatives include no loss of consciousness, numbness or tingling. She has tried rest and acetaminophen for the symptoms. The treatment provided mild relief.      Review of Systems  Neurological: Negative for tingling, loss of consciousness and numbness.  All other systems reviewed and are negative.      Objective:   Physical Exam Vitals signs reviewed.  Constitutional:      General: She is not in acute distress.    Appearance: She is well-developed.  HENT:     Head: Normocephalic and atraumatic.  Neck:     Musculoskeletal: Normal range of motion and neck supple.     Thyroid: No thyromegaly.  Cardiovascular:     Rate and Rhythm: Normal rate and regular rhythm.     Heart sounds: Normal heart sounds. No murmur.  Pulmonary:     Effort: Pulmonary effort is normal. No respiratory distress.     Breath sounds: Normal breath sounds. No wheezing.  Abdominal:     General: Bowel sounds are normal. There is no distension.     Palpations: Abdomen is soft.     Tenderness: There is no abdominal tenderness.  Musculoskeletal: Normal range of motion.        General: Tenderness (in left foot with extension) present.     Right lower leg: Edema (3+) present.     Left lower leg: Edema (3+) present.  Skin:    General: Skin is warm and dry.     Findings: Bruising and ecchymosis present.  Neurological:     Mental Status: She is alert and oriented to  person, place, and time.     Cranial Nerves: No cranial nerve deficit.     Deep Tendon Reflexes: Reflexes are normal and symmetric.  Psychiatric:        Behavior: Behavior normal.        Thought Content: Thought content normal.        Judgment: Judgment normal.     BP (!) 154/80   Pulse 69   Temp (!) 97.1 F (36.2 C) (Oral)   Ht 5\' 6"  (1.676 m)   Wt 209 lb 9.6 oz (95.1 kg)   BMI 33.83 kg/m        Assessment & Plan:  Jenna [name] comes in today with chief complaint of left swollen ankle due to fall   Diagnosis and orders addressed:  1. Left foot pain - DG Ankle Complete Left; Future - DG Foot Complete Left; Future  2. Peripheral edema - DG Ankle Complete Left; Future - DG Foot Complete Left; Future  3. Fall in home, initial encounter - DG Ankle Complete Left; Future - DG Foot Complete Left; Future  Start wearing compression hose daily Low salt diet Keep feet elevated X-ray pending  Tylenol as needed Fall preventions  RTO if symptoms worsen or do not  improved    Evelina Dun, FNP

## 2018-12-05 DIAGNOSIS — H00014 Hordeolum externum left upper eyelid: Secondary | ICD-10-CM | POA: Diagnosis not present

## 2018-12-15 ENCOUNTER — Other Ambulatory Visit: Payer: Self-pay | Admitting: Family Medicine

## 2018-12-18 ENCOUNTER — Ambulatory Visit: Payer: Medicare Other | Admitting: Family Medicine

## 2018-12-19 ENCOUNTER — Other Ambulatory Visit: Payer: Self-pay

## 2018-12-20 ENCOUNTER — Ambulatory Visit (INDEPENDENT_AMBULATORY_CARE_PROVIDER_SITE_OTHER): Payer: Medicare Other

## 2018-12-20 DIAGNOSIS — E538 Deficiency of other specified B group vitamins: Secondary | ICD-10-CM

## 2018-12-21 ENCOUNTER — Other Ambulatory Visit: Payer: Self-pay | Admitting: Family Medicine

## 2018-12-22 ENCOUNTER — Telehealth: Payer: Self-pay | Admitting: Cardiology

## 2018-12-22 NOTE — Telephone Encounter (Signed)

## 2018-12-27 ENCOUNTER — Ambulatory Visit: Payer: Medicare Other | Admitting: Cardiology

## 2019-01-04 ENCOUNTER — Ambulatory Visit: Payer: Medicare Other | Admitting: Family Medicine

## 2019-01-12 ENCOUNTER — Other Ambulatory Visit: Payer: Self-pay | Admitting: Family Medicine

## 2019-01-19 ENCOUNTER — Other Ambulatory Visit: Payer: Self-pay

## 2019-01-22 ENCOUNTER — Ambulatory Visit: Payer: Medicare Other

## 2019-01-22 ENCOUNTER — Ambulatory Visit (INDEPENDENT_AMBULATORY_CARE_PROVIDER_SITE_OTHER): Payer: Medicare Other | Admitting: Family Medicine

## 2019-01-22 ENCOUNTER — Other Ambulatory Visit: Payer: Self-pay

## 2019-01-22 ENCOUNTER — Encounter: Payer: Self-pay | Admitting: Family Medicine

## 2019-01-22 VITALS — BP 159/71 | HR 61 | Temp 96.8°F | Ht 66.0 in | Wt 211.0 lb

## 2019-01-22 DIAGNOSIS — E782 Mixed hyperlipidemia: Secondary | ICD-10-CM

## 2019-01-22 DIAGNOSIS — D519 Vitamin B12 deficiency anemia, unspecified: Secondary | ICD-10-CM | POA: Diagnosis not present

## 2019-01-22 DIAGNOSIS — Z23 Encounter for immunization: Secondary | ICD-10-CM

## 2019-01-22 DIAGNOSIS — E538 Deficiency of other specified B group vitamins: Secondary | ICD-10-CM

## 2019-01-22 DIAGNOSIS — I10 Essential (primary) hypertension: Secondary | ICD-10-CM | POA: Diagnosis not present

## 2019-01-22 DIAGNOSIS — I480 Paroxysmal atrial fibrillation: Secondary | ICD-10-CM | POA: Diagnosis not present

## 2019-01-22 DIAGNOSIS — E039 Hypothyroidism, unspecified: Secondary | ICD-10-CM

## 2019-01-22 DIAGNOSIS — R7303 Prediabetes: Secondary | ICD-10-CM

## 2019-01-22 MED ORDER — RIVAROXABAN 20 MG PO TABS: 20.0000 mg | ORAL_TABLET | Freq: Every day | ORAL | 3 refills | Status: DC

## 2019-01-22 MED ORDER — LEVOTHYROXINE SODIUM 75 MCG PO TABS: 75.0000 ug | ORAL_TABLET | Freq: Every day | ORAL | 3 refills | Status: DC

## 2019-01-22 MED ORDER — METOPROLOL SUCCINATE ER 50 MG PO TB24: ORAL_TABLET | ORAL | 3 refills | Status: DC

## 2019-01-22 NOTE — Progress Notes (Signed)
BP (!) 159/71   Pulse 61   Temp (!) 96.8 F (36 C) (Temporal)   Ht 5\' 6"  (1.676 m)   Wt 211 lb (95.7 kg)   BMI 34.06 kg/m    Subjective:   Patient ID: Jenna [name], female    DOB: Dec 29, 1935, 83 y.o.   MRN: [id number]  HPI: Jenna [name] is a 83 y.o. female presenting on 01/22/2019 for Establish Care ([place]) and Medical Management of Chronic Issues   HPI PreDiabetes Patient comes in today for recheck of his diabetes. Patient has been currently taking no medication has been diet control. Patient is not currently on an ACE inhibitor/ARB. Patient has not seen an ophthalmologist this year. Patient denies any issues with their feet.   Hypertension Patient is currently on metoprolol, and their blood pressure today is 59/71. Patient denies any lightheadedness or dizziness. Patient denies headaches, blurred vision, chest pains, shortness of breath, or weakness. Denies any side effects from medication and is content with current medication.   Hyperlipidemia Patient is coming in for recheck of his hyperlipidemia. The patient is currently taking no medication currently and has been diet controlled, she has listed an allergy to atorvastatin and simvastatin. They deny any issues with myalgias or history of liver damage from it. They deny any focal numbness or weakness or chest pain.   Hypothyroidism recheck Patient is coming in for thyroid recheck today as well. They deny any issues with hair changes or heat or cold problems or diarrhea or constipation. They deny any chest pain or palpitations. They are currently on levothyroxine 16micrograms   Coming in for recheck of A. fib.  Patient is currently on Xarelto.  Patient is coming in for recheck of B12 deficiency.  Relevant past medical, surgical, family and social history reviewed and updated as indicated. Interim medical history since our last visit reviewed. Allergies and medications reviewed and updated.  Review of Systems   Constitutional: Negative for chills and fever.  Eyes: Negative for redness and visual disturbance.  Respiratory: Negative for chest tightness and shortness of breath.   Cardiovascular: Negative for chest pain and leg swelling.  Musculoskeletal: Negative for back pain and gait problem.  Skin: Negative for rash.  Neurological: Negative for dizziness, light-headedness and headaches.  Psychiatric/Behavioral: Negative for agitation and behavioral problems.  All other systems reviewed and are negative.   Per HPI unless specifically indicated above   Allergies as of 01/22/2019      Reactions   Daypro [oxaprozin]    Unknown reaction   Diclofenac Sodium    Unknown reaction   Effexor [venlafaxine Hydrochloride]    Unknown reaction   Prozac [fluoxetine Hcl]    Unknown reaction   Amitriptyline Rash   Cymbalta [duloxetine Hcl] Nausea Only   Lipitor [atorvastatin Calcium] Other (See Comments)   Leg aches   Penicillins Rash   Has patient had a PCN reaction causing immediate rash, facial/tongue/throat swelling, SOB or lightheadedness with hypotension: Yes Has patient had a PCN reaction causing severe rash involving mucus membranes or skin necrosis: No Has patient had a PCN reaction that required hospitalization: No Has patient had a PCN reaction occurring within the last 10 years: No If all of the above answers are "NO", then may proceed with Cephalosporin use.   Savella [milnacipran Hcl] Rash   Sulfa Antibiotics Rash   Zocor [simvastatin] Other (See Comments)   Hip pain      Medication List       Accurate as of  January 22, 2019 11:59 PM. If you have any questions, ask your nurse or doctor.        STOP taking these medications   Icosapent Ethyl 1 g Caps Commonly known as: Vascepa Stopped by: Worthy Rancher, MD     TAKE these medications   acetaminophen 500 MG tablet Commonly known as: TYLENOL Take 500 mg by mouth daily as needed for moderate pain or headache.    fluticasone 50 MCG/ACT nasal spray Commonly known as: FLONASE Place 2 sprays into both nostrils daily.   furosemide 40 MG tablet Commonly known as: LASIX Take 1 tablet (40 mg total) by mouth daily.   gabapentin 100 MG capsule Commonly known as: NEURONTIN Take 1 capsule (100 mg total) by mouth 3 (three) times daily.   hydrocortisone 2.5 % rectal cream Commonly known as: ANUSOL-HC APPLY RECTALLY DAILY AS NEEDED FOR HEMMORHOIDS OR ITCHING.   levothyroxine 75 MCG tablet Commonly known as: SYNTHROID Take 1 tablet (75 mcg total) by mouth daily.   lidocaine 5 % ointment Commonly known as: XYLOCAINE Apply 1 application topically as needed.   metoprolol succinate 50 MG 24 hr tablet Commonly known as: TOPROL-XL TAKE 1 TABLET DAILY WITH OR IMMEDIATELY FOLLOWING A MEAL.   nitroGLYCERIN 0.4 MG SL tablet Commonly known as: NITROSTAT Place 1 tablet (0.4 mg total) under the tongue every 5 (five) minutes x 3 doses as needed for chest pain.   pantoprazole 40 MG tablet Commonly known as: PROTONIX TAKE 1 TABLET BY MOUTH DAILY BEFORE BREAKFAST.   rivaroxaban 20 MG Tabs tablet Commonly known as: Xarelto Take 1 tablet (20 mg total) by mouth daily with supper. What changed: See the new instructions. Changed by: Worthy Rancher, MD   Vitamin D3 50 MCG (2000 UT) Tabs Take 2,000 Units by mouth daily.        Objective:   BP (!) 159/71   Pulse 61   Temp (!) 96.8 F (36 C) (Temporal)   Ht 5\' 6"  (1.676 m)   Wt 211 lb (95.7 kg)   BMI 34.06 kg/m   Wt Readings from Last 3 Encounters:  01/25/19 210 lb 9.6 oz (95.5 kg)  01/22/19 211 lb (95.7 kg)  12/01/18 209 lb 9.6 oz (95.1 kg)    Physical Exam Vitals signs and nursing note reviewed.  Constitutional:      General: She is not in acute distress.    Appearance: She is well-developed. She is not diaphoretic.  Eyes:     Conjunctiva/sclera: Conjunctivae normal.  Cardiovascular:     Rate and Rhythm: Normal rate and regular rhythm.      Heart sounds: Normal heart sounds. No murmur.  Pulmonary:     Effort: Pulmonary effort is normal. No respiratory distress.     Breath sounds: Normal breath sounds. No wheezing.  Musculoskeletal: Normal range of motion.        General: No tenderness.  Skin:    General: Skin is warm and dry.     Findings: No rash.  Neurological:     Mental Status: She is alert and oriented to person, place, and time.     Coordination: Coordination normal.  Psychiatric:        Behavior: Behavior normal.       Assessment & Plan:   Problem List Items Addressed This Visit      Cardiovascular and Mediastinum   AF (paroxysmal atrial fibrillation) (HCC)   Relevant Medications   metoprolol succinate (TOPROL-XL) 50 MG 24 hr tablet  rivaroxaban (XARELTO) 20 MG TABS tablet   Hypertension   Relevant Medications   metoprolol succinate (TOPROL-XL) 50 MG 24 hr tablet   rivaroxaban (XARELTO) 20 MG TABS tablet     Endocrine   Hypothyroidism   Relevant Medications   metoprolol succinate (TOPROL-XL) 50 MG 24 hr tablet   levothyroxine (SYNTHROID) 75 MCG tablet     Other   Hyperlipidemia   Relevant Medications   metoprolol succinate (TOPROL-XL) 50 MG 24 hr tablet   rivaroxaban (XARELTO) 20 MG TABS tablet   Other Relevant Orders   Lipid panel (Completed)   Pre-diabetes - Primary   Relevant Orders   Bayer DCA Hb A1c Waived (Completed)   B12 deficiency anemia      Continue medication, no changes.  Will check blood work today and return in 6 months. Follow up plan: Return in about 6 months (around 07/22/2019), or if symptoms worsen or fail to improve, for htn and hld.  Counseling provided for all of the vaccine components Orders Placed This Encounter  Procedures  . Varicella-zoster vaccine IM (Shingrix)  . Bayer DCA Hb A1c Waived  . Lipid panel    Caryl Pina, MD [facility] 01/27/2019, 10:09 PM

## 2019-01-23 ENCOUNTER — Other Ambulatory Visit: Payer: Medicare Other

## 2019-01-23 DIAGNOSIS — R7303 Prediabetes: Secondary | ICD-10-CM

## 2019-01-23 DIAGNOSIS — E782 Mixed hyperlipidemia: Secondary | ICD-10-CM | POA: Diagnosis not present

## 2019-01-23 DIAGNOSIS — Z23 Encounter for immunization: Secondary | ICD-10-CM

## 2019-01-23 LAB — BAYER DCA HB A1C WAIVED: HB A1C (BAYER DCA - WAIVED): 6.3 % (ref <7.0)

## 2019-01-24 LAB — LIPID PANEL
Chol/HDL Ratio: 3.9 ratio (ref 0.0–4.4)
Cholesterol, Total: 222 mg/dL — ABNORMAL HIGH (ref 100–199)
HDL: 57 mg/dL (ref >39)
LDL Chol Calc (NIH): 135 mg/dL — ABNORMAL HIGH (ref 0–99)
Triglycerides: 169 mg/dL — ABNORMAL HIGH (ref 0–149)
VLDL Cholesterol Cal: 30 mg/dL (ref 5–40)

## 2019-01-25 ENCOUNTER — Other Ambulatory Visit: Payer: Self-pay

## 2019-01-25 ENCOUNTER — Ambulatory Visit (INDEPENDENT_AMBULATORY_CARE_PROVIDER_SITE_OTHER): Payer: Medicare Other | Admitting: Nurse Practitioner

## 2019-01-25 ENCOUNTER — Encounter (INDEPENDENT_AMBULATORY_CARE_PROVIDER_SITE_OTHER): Payer: Self-pay | Admitting: Nurse Practitioner

## 2019-01-25 VITALS — BP 145/74 | HR 69 | Temp 97.9°F | Ht 63.0 in | Wt 210.6 lb

## 2019-01-25 DIAGNOSIS — K219 Gastro-esophageal reflux disease without esophagitis: Secondary | ICD-10-CM

## 2019-01-25 MED ORDER — PANTOPRAZOLE SODIUM 40 MG PO TBEC: 40.0000 mg | DELAYED_RELEASE_TABLET | Freq: Every day | ORAL | 3 refills | Status: DC

## 2019-01-25 NOTE — Patient Instructions (Signed)
1. Continue Pantoprazole (Protonix) 40mg  one capsule by mouth to be taken 30 minutes before breakfast   2. Follow up in our office in 1 yea and as needed

## 2019-01-25 NOTE — Progress Notes (Signed)
Subjective:    Patient ID: Jenna [name], female    DOB: Oct 09, 1935, 83 y.o.   MRN: [id number]  HPI PMH of afib on Xarelto, HTN, OSA on Cpap, hypothyroidism, IDA, Vitamin B 12 deficiency and pre diabetes. Patient presents today for follow up. She remains on Protonix 40mg  once daily due to having PUD. She is on Xarelto secondary to having atrial fibrillation. She denies having any dysphagia, heartburn or stomach pain. She is passing a normal formed brown stool once daily. No rectal bleeding or black stools. She had shingles to her left flank/back area 2 weeks ago which is resolving.    CBC    Component Value Date/Time   WBC 6.0 10/30/2018 1300   RBC 4.04 10/30/2018 1300   HGB 13.0 10/30/2018 1300   HGB 13.3 08/09/2018 1309   HCT 38.6 10/30/2018 1300   HCT 40.4 08/09/2018 1309   PLT 195 10/30/2018 1300   PLT 223 08/09/2018 1309   MCV 95.5 10/30/2018 1300   MCV 92 08/09/2018 1309   MCH 32.2 10/30/2018 1300   MCHC 33.7 10/30/2018 1300   RDW 14.1 10/30/2018 1300   RDW 13.9 08/09/2018 1309   LYMPHSABS 1.1 10/30/2018 1300   LYMPHSABS 1.3 08/09/2018 1309   MONOABS 0.7 10/30/2018 1300   EOSABS 0.1 10/30/2018 1300   EOSABS 0.1 08/09/2018 1309   BASOSABS 0.1 10/30/2018 1300   BASOSABS 0.1 08/09/2018 1309      EGD 04/24/2018:  - Normal esophagus. - Z-line irregular, 41 cm from the incisors. - A single gastric polyp with ulcerated surface.. Biopsied. Clip (MR conditional) was placed. - Normal duodenal bulb and second portion of the duodenum.   Colonoscopy 04/24/2018:  - The examined portion of the ileum was normal. - Diverticulosis in the sigmoid colon. - External hemorrhoids. - Anal papilla(e) were hypertrophied. - No specimens collected.  Past Medical History:  Diagnosis Date  . Cataract   . DDD (degenerative disc disease)   . Fibromyalgia   . Herpes zoster   . History of skin cancer   . Hyperlipidemia   . Hypertension   . Hypothyroidism   . Iron deficiency anemia  due to chronic blood loss 04/06/2018  . OA (osteoarthritis)   . OSA on CPAP 01/16/2018   Mild OSA overall with an AHI of 5.8/h but during REM sleep moderate with an AHI of 28/h.  Oxygen saturations dropped to 76% during respiratory events.  Now on CPAP at 9 cm H2O   . Osteoporosis   . Palpitations   . Pre-diabetes   . PUD (peptic ulcer disease)   . Skin cancer of nose    [name]   Current Outpatient Medications on File Prior to Visit  Medication Sig Dispense Refill  . acetaminophen (TYLENOL) 500 MG tablet Take 500 mg by mouth daily as needed for moderate pain or headache.    . Cholecalciferol (VITAMIN D3) 2000 UNITS TABS Take 2,000 Units by mouth daily.     . fluticasone (FLONASE) 50 MCG/ACT nasal spray Place 2 sprays into both nostrils daily. 16 g 6  . furosemide (LASIX) 40 MG tablet Take 1 tablet (40 mg total) by mouth daily. 30 tablet 3  . gabapentin (NEURONTIN) 100 MG capsule Take 1 capsule (100 mg total) by mouth 3 (three) times daily. 90 capsule 3  . hydrocortisone (ANUSOL-HC) 2.5 % rectal cream APPLY RECTALLY DAILY AS NEEDED FOR HEMMORHOIDS OR ITCHING. 30 g 0  . levothyroxine (SYNTHROID) 75 MCG tablet Take 1 tablet (75 mcg  total) by mouth daily. 90 tablet 3  . lidocaine (XYLOCAINE) 5 % ointment Apply 1 application topically as needed. 35.44 g 0  . metoprolol succinate (TOPROL-XL) 50 MG 24 hr tablet TAKE 1 TABLET DAILY WITH OR IMMEDIATELY FOLLOWING A MEAL. 90 tablet 3  . nitroGLYCERIN (NITROSTAT) 0.4 MG SL tablet Place 1 tablet (0.4 mg total) under the tongue every 5 (five) minutes x 3 doses as needed for chest pain. 30 tablet 0  . rivaroxaban (XARELTO) 20 MG TABS tablet Take 1 tablet (20 mg total) by mouth daily with supper. 90 tablet 3   Current Facility-Administered Medications on File Prior to Visit  Medication Dose Route Frequency Provider Last Rate Last Dose  . cyanocobalamin ((VITAMIN B-12)) injection 1,000 mcg  1,000 mcg Intramuscular Q30 days Chipper Herb, MD   1,000  mcg at 01/22/19 1625   Allergies  Allergen Reactions  . Daypro [Oxaprozin]     Unknown reaction  . Diclofenac Sodium     Unknown reaction  . Effexor [Venlafaxine Hydrochloride]     Unknown reaction  . Prozac [Fluoxetine Hcl]     Unknown reaction  . Amitriptyline Rash  . Cymbalta [Duloxetine Hcl] Nausea Only  . Lipitor [Atorvastatin Calcium] Other (See Comments)    Leg aches  . Penicillins Rash    Has patient had a PCN reaction causing immediate rash, facial/tongue/throat swelling, SOB or lightheadedness with hypotension: Yes Has patient had a PCN reaction causing severe rash involving mucus membranes or skin necrosis: No Has patient had a PCN reaction that required hospitalization: No Has patient had a PCN reaction occurring within the last 10 years: No If all of the above answers are "NO", then may proceed with Cephalosporin use.   [name] [Milnacipran Hcl] Rash  . Sulfa Antibiotics Rash  . Zocor [Simvastatin] Other (See Comments)    Hip pain      Objective:   Physical Exam  Blood pressure (!) 145/74, pulse 69, temperature 97.9 F (36.6 C), temperature source Oral, height 5\' 3"  (1.6 m), weight 210 lb 9.6 oz (95.5 kg).  General: 83 year old female in NAD Eyes: sclera nonicteric, conjunctiva pink Mouth: upper dentures Neck: supple, no thyromegaly or lymphadenopathy Heart: RRR, no murmur Lungs: clear throughout Abdomen: soft, nontender, no masses or organomegaly Extremities: trace  edema Neuro: alert and oriented x 4, no focal deficits      Assessment & Plan:   1. PUD  -Continue Protonix 40mg  QD as long as patient stays on Xarelto  -follow up in office in 1 year   2. Afib on Xarelto, normal heart rhythm on exam

## 2019-02-07 ENCOUNTER — Other Ambulatory Visit: Payer: Self-pay | Admitting: Family Medicine

## 2019-02-08 ENCOUNTER — Ambulatory Visit (INDEPENDENT_AMBULATORY_CARE_PROVIDER_SITE_OTHER): Payer: Medicare Other | Admitting: *Deleted

## 2019-02-08 DIAGNOSIS — Z0000 Encounter for general adult medical examination without abnormal findings: Secondary | ICD-10-CM | POA: Diagnosis not present

## 2019-02-08 NOTE — Patient Instructions (Signed)
Preventive Care 81 Years and Older, Female Preventive care refers to lifestyle choices and visits with your health care provider that can promote health and wellness. This includes:  A yearly physical exam. This is also called an annual well check.  Regular dental and eye exams.  Immunizations.  Screening for certain conditions.  Healthy lifestyle choices, such as diet and exercise. What can I expect for my preventive care visit? Physical exam Your health care provider will check:  Height and weight. These may be used to calculate body mass index (BMI), which is a measurement that tells if you are at a healthy weight.  Heart rate and blood pressure.  Your skin for abnormal spots. Counseling Your health care provider may ask you questions about:  Alcohol, tobacco, and drug use.  Emotional well-being.  Home and relationship well-being.  Sexual activity.  Eating habits.  History of falls.  Memory and ability to understand (cognition).  Work and work Statistician.  Pregnancy and menstrual history. What immunizations do I need?  Influenza (flu) vaccine  This is recommended every year. Tetanus, diphtheria, and pertussis (Tdap) vaccine  You may need a Td booster every 10 years. Varicella (chickenpox) vaccine  You may need this vaccine if you have not already been vaccinated. Zoster (shingles) vaccine  You may need this after age 81. Pneumococcal conjugate (PCV13) vaccine  One dose is recommended after age 81. Pneumococcal polysaccharide (PPSV23) vaccine  One dose is recommended after age 81. Measles, mumps, and rubella (MMR) vaccine  You may need at least one dose of MMR if you were born in 1957 or later. You may also need a second dose. Meningococcal conjugate (MenACWY) vaccine  You may need this if you have certain conditions. Hepatitis A vaccine  You may need this if you have certain conditions or if you travel or work in places where you may be exposed  to hepatitis A. Hepatitis B vaccine  You may need this if you have certain conditions or if you travel or work in places where you may be exposed to hepatitis B. Haemophilus influenzae type b (Hib) vaccine  You may need this if you have certain conditions. You may receive vaccines as individual doses or as more than one vaccine together in one shot (combination vaccines). Talk with your health care provider about the risks and benefits of combination vaccines. What tests do I need? Blood tests  Lipid and cholesterol levels. These may be checked every 5 years, or more frequently depending on your overall health.  Hepatitis C test.  Hepatitis B test. Screening  Lung cancer screening. You may have this screening every year starting at age 81 if you have a 30-pack-year history of smoking and currently smoke or have quit within the past 15 years.  Colorectal cancer screening. All adults should have this screening starting at age 81 and continuing until age 81. Your health care provider may recommend screening at age 81 if you are at increased risk. You will have tests every 1-10 years, depending on your results and the type of screening test.  Diabetes screening. This is done by checking your blood sugar (glucose) after you have not eaten for a while (fasting). You may have this done every 1-3 years.  Mammogram. This may be done every 1-2 years. Talk with your health care provider about how often you should have regular mammograms.  BRCA-related cancer screening. This may be done if you have a family history of breast, ovarian, tubal, or peritoneal cancers.  Other tests  Sexually transmitted disease (STD) testing.  Bone density scan. This is done to screen for osteoporosis. You may have this done starting at age 81. Follow these instructions at home: Eating and drinking  Eat a diet that includes fresh fruits and vegetables, whole grains, lean protein, and low-fat dairy products. Limit  your intake of foods with high amounts of sugar, saturated fats, and salt.  Take vitamin and mineral supplements as recommended by your health care provider.  Do not drink alcohol if your health care provider tells you not to drink.  If you drink alcohol: ? Limit how much you have to 0-1 drink a day. ? Be aware of how much alcohol is in your drink. In the [place] one drink equals one 12 oz bottle of beer (355 mL), one 5 oz glass of wine (148 mL), or one 1 oz glass of hard liquor (44 mL). Lifestyle  Take daily care of your teeth and gums.  Stay active. Exercise for at least 30 minutes on 5 or more days each week.  Do not use any products that contain nicotine or tobacco, such as cigarettes, e-cigarettes, and chewing tobacco. If you need help quitting, ask your health care provider.  If you are sexually active, practice safe sex. Use a condom or other form of protection in order to prevent STIs (sexually transmitted infections).  Talk with your health care provider about taking a low-dose aspirin or statin. What's next?  Go to your health care provider once a year for a well check visit.  Ask your health care provider how often you should have your eyes and teeth checked.  Stay up to date on all vaccines. This information is not intended to replace advice given to you by your health care provider. Make sure you discuss any questions you have with your health care provider. Document Released: 06/06/2015 Document Revised: 05/04/2018 Document Reviewed: 05/04/2018 Elsevier Patient Education  2020 [employer].

## 2019-02-08 NOTE — Progress Notes (Signed)
MEDICARE ANNUAL WELLNESS VISIT  02/08/2019  Telephone Visit Disclaimer This Medicare AWV was conducted by telephone due to national recommendations for restrictions regarding the COVID-19 Pandemic (e.g. social distancing).  I verified, using two identifiers, that I am speaking with Jenna [name] or their authorized healthcare agent. I discussed the limitations, risks, security, and privacy concerns of performing an evaluation and management service by telephone and the potential availability of an in-person appointment in the future. The patient expressed understanding and agreed to proceed.   Subjective:  Jenna [name] is a 83 y.o. female patient of Dettinger, Fransisca Kaufmann, MD who had a Medicare Annual Wellness Visit today via telephone. Jenna [name] is Retired and lives with their spouse. she has 1 child. she reports that she is socially active and does interact with friends/family regularly. she is minimally physically active and enjoys knitting.  Patient Care Team: Dettinger, Fransisca Kaufmann, MD as PCP - General (Family Medicine) Satira Sark, MD as PCP - Cardiology (Cardiology) Rogene Houston, MD as Consulting Physician (Gastroenterology) Satira Sark, MD as Consulting Physician (Cardiology) Latanya Maudlin, MD as Consulting Physician (Orthopedic Surgery) Gaynelle Arabian, MD as Consulting Physician (Orthopedic Surgery) Particia Nearing, [place] (Optometry)  Advanced Directives 02/08/2019 11/06/2018 09/24/2018 05/10/2018 04/24/2018 04/19/2018 04/12/2018  Does Patient Have a Medical Advance Directive? No No No No No No No  Would patient like information on creating a medical advance directive? No - Patient declined No - Patient declined No - Guardian declined No - Patient declined No - Patient declined No - Patient declined No - Patient declined  Pre-existing out of facility DNR order (yellow form or pink MOST form) - - - - - - -    Hospital Utilization Over the Past 12 Months: # of  hospitalizations or ER visits: 2 # of surgeries: 0  Review of Systems    Patient reports that her overall health is worse compared to last year.  History obtained from chart review  Patient Reported Readings (BP, Pulse, CBG, Weight, etc) none  Pain Assessment Pain : 0-10 Pain Score: 6  Pain Type: Other (Comment)(gout pain in big toe) Pain Location: Toe (Comment which one)(left big toe) Pain Orientation: Left Pain Descriptors / Indicators: Aching, Throbbing, Heaviness Pain Onset: In the past 7 days Pain Frequency: Intermittent Pain Relieving Factors: Colcrys Effect of Pain on Daily Activities: severe  Pain Relieving Factors: Colcrys  Current Medications & Allergies (verified) Allergies as of 02/08/2019      Reactions   Daypro [oxaprozin]    Unknown reaction   Diclofenac Sodium    Unknown reaction   Effexor [venlafaxine Hydrochloride]    Unknown reaction   Prozac [fluoxetine Hcl]    Unknown reaction   Amitriptyline Rash   Cymbalta [duloxetine Hcl] Nausea Only   Lipitor [atorvastatin Calcium] Other (See Comments)   Leg aches   Penicillins Rash   Has patient had a PCN reaction causing immediate rash, facial/tongue/throat swelling, SOB or lightheadedness with hypotension: Yes Has patient had a PCN reaction causing severe rash involving mucus membranes or skin necrosis: No Has patient had a PCN reaction that required hospitalization: No Has patient had a PCN reaction occurring within the last 10 years: No If all of the above answers are "NO", then may proceed with Cephalosporin use.   Savella [milnacipran Hcl] Rash   Sulfa Antibiotics Rash   Zocor [simvastatin] Other (See Comments)   Hip pain      Medication List  Accurate as of February 08, 2019  2:35 PM. If you have any questions, ask your nurse or doctor.        STOP taking these medications   fluticasone 50 MCG/ACT nasal spray Commonly known as: FLONASE   lidocaine 5 % ointment Commonly known as:  XYLOCAINE     TAKE these medications   acetaminophen 500 MG tablet Commonly known as: TYLENOL Take 500 mg by mouth daily as needed for moderate pain or headache.   Colcrys 0.6 MG tablet Generic drug: colchicine TAKE 1 TABLET DAILY AS NEEDED GOUT FLARE UP.   furosemide 40 MG tablet Commonly known as: LASIX Take 1 tablet (40 mg total) by mouth daily.   gabapentin 100 MG capsule Commonly known as: NEURONTIN Take 1 capsule (100 mg total) by mouth 3 (three) times daily.   hydrocortisone 2.5 % rectal cream Commonly known as: ANUSOL-HC APPLY RECTALLY DAILY AS NEEDED FOR HEMMORHOIDS OR ITCHING.   levothyroxine 75 MCG tablet Commonly known as: SYNTHROID Take 1 tablet (75 mcg total) by mouth daily.   metoprolol succinate 50 MG 24 hr tablet Commonly known as: TOPROL-XL TAKE 1 TABLET DAILY WITH OR IMMEDIATELY FOLLOWING A MEAL.   nitroGLYCERIN 0.4 MG SL tablet Commonly known as: NITROSTAT Place 1 tablet (0.4 mg total) under the tongue every 5 (five) minutes x 3 doses as needed for chest pain.   pantoprazole 40 MG tablet Commonly known as: PROTONIX Take 1 tablet (40 mg total) by mouth daily.   rivaroxaban 20 MG Tabs tablet Commonly known as: Xarelto Take 1 tablet (20 mg total) by mouth daily with supper.   Vitamin D3 50 MCG (2000 UT) Tabs Take 2,000 Units by mouth daily.       History (reviewed): Past Medical History:  Diagnosis Date  . Cataract   . DDD (degenerative disc disease)   . Fibromyalgia   . Herpes zoster   . History of skin cancer   . Hyperlipidemia   . Hypertension   . Hypothyroidism   . Iron deficiency anemia due to chronic blood loss 04/06/2018  . OA (osteoarthritis)   . OSA on CPAP 01/16/2018   Mild OSA overall with an AHI of 5.8/h but during REM sleep moderate with an AHI of 28/h.  Oxygen saturations dropped to 76% during respiratory events.  Now on CPAP at 9 cm H2O   . Osteoporosis   . Palpitations   . Pre-diabetes   . PUD (peptic ulcer disease)    . Skin cancer of nose    Jenna [name]   Past Surgical History:  Procedure Laterality Date  . ABDOMINAL HYSTERECTOMY     partial  . BIOPSY  04/24/2018   Procedure: BIOPSY;  Surgeon: Rogene Houston, MD;  Location: [facility];  Service: Endoscopy;;  gastric body ulcerated surface  . BREAST BIOPSY     Right  . CATARACT EXTRACTION W/PHACO  10/28/2011   Procedure: CATARACT EXTRACTION PHACO AND INTRAOCULAR LENS PLACEMENT (IOC);  Surgeon: Tonny Branch, MD;  Location: [facility];  Service: Ophthalmology;  Laterality: Right;  CDE 18.38  . CATARACT EXTRACTION W/PHACO  02/14/2012   Procedure: CATARACT EXTRACTION PHACO AND INTRAOCULAR LENS PLACEMENT (IOC);  Surgeon: Tonny Branch, MD;  Location: [facility];  Service: Ophthalmology;  Laterality: Left;  CDE 17.20  . CESAREAN SECTION    . COLONOSCOPY N/A 05/31/2013   Procedure: COLONOSCOPY;  Surgeon: Rogene Houston, MD;  Location: [facility];  Service: Endoscopy;  Laterality: N/A;  240  . COLONOSCOPY N/A  04/24/2018   Procedure: COLONOSCOPY;  Surgeon: Rogene Houston, MD;  Location: [facility];  Service: Endoscopy;  Laterality: N/A;  9:30  . ESOPHAGOGASTRODUODENOSCOPY N/A 04/24/2018   Procedure: ESOPHAGOGASTRODUODENOSCOPY (EGD);  Surgeon: Rogene Houston, MD;  Location: [facility];  Service: Endoscopy;  Laterality: N/A;  . EYE SURGERY     skin removal   . FRACTURE SURGERY  1952   fractured right arm  . LEFT HEART CATH AND CORONARY ANGIOGRAPHY N/A 09/25/2018   Procedure: LEFT HEART CATH AND CORONARY ANGIOGRAPHY;  Surgeon: Jettie Booze, MD;  Location: [facility];  Service: Cardiovascular;  Laterality: N/A;   Family History  Problem Relation Age of Onset  . CVA Mother   . Stroke Mother   . Osteoporosis Mother   . Cancer Father        prostate  . Bronchitis Sister   . COPD Sister   . Early death Brother        died in [place]  . Carpal tunnel syndrome Sister   . Cancer Brother        throat  . Cancer Brother        lung  . Cancer  Brother        throat  . Stroke Brother   . Heart attack Brother   . Gout Brother   . Heart Problems Brother        stents  . CAD Brother   . Hypertension Brother   . Gout Brother   . Arthritis Brother   . Diabetes Son   . Coronary artery disease Neg Hx        No premature  . Anesthesia problems Neg Hx   . Hypotension Neg Hx   . Malignant hyperthermia Neg Hx   . Pseudochol deficiency Neg Hx    Social History   Socioeconomic History  . Marital status: Married    Spouse name: [name]  . Number of children: 1  . Years of education: 49  . Highest education level: 9th grade  Occupational History  . Occupation: Retired    Comment: Therapist, art  Social Needs  . Financial resource strain: Not hard at all  . Food insecurity    Worry: Never true    Inability: Never true  . Transportation needs    Medical: No    Non-medical: No  Tobacco Use  . Smoking status: Never Smoker  . Smokeless tobacco: Never Used  Substance and Sexual Activity  . Alcohol use: No  . Drug use: No  . Sexual activity: Not Currently    Birth control/protection: Surgical  Lifestyle  . Physical activity    Days per week: 0 days    Minutes per session: 0 min  . Stress: Only a little  Relationships  . Social connections    Talks on phone: More than three times a week    Gets together: More than three times a week    Attends religious service: More than 4 times per year    Active member of club or organization: Yes    Attends meetings of clubs or organizations: More than 4 times per year    Relationship status: Married  Other Topics Concern  . Not on file  Social History Narrative   Married   Lives in a one story home     Activities of Daily Living In your present state of health, do you have any difficulty performing the following activities: 02/08/2019 09/24/2018  Hearing? N -  Vision? N -  Comment gets yearly eye exam -  Difficulty concentrating or making decisions? N -   Walking or climbing stairs? Y -  Comment can walk on flat surfaces but doesn't climb stairs due to knee pain-doesn't use a cane or walker -  Dressing or bathing? N -  Doing errands, shopping? N N  Preparing Food and eating ? N -  Using the Toilet? N -  In the past six months, have you accidently leaked urine? N -  Do you have problems with loss of bowel control? N -  Managing your Medications? N -  Managing your Finances? N -  Housekeeping or managing your Housekeeping? N -  Some recent data might be hidden    Patient Education/ Literacy How often do you need to have someone help you when you read instructions, pamphlets, or other written materials from your doctor or pharmacy?: 1 - Never What is the last grade level you completed in school?: 9th grade  Exercise Current Exercise Habits: The patient does not participate in regular exercise at present, Exercise limited by: orthopedic condition(s)  Diet Patient reports consuming 3 meals a day and 1 snack(s) a day Patient reports that her primary diet is: Regular Patient reports that she does have regular access to food.   Depression Screen PHQ 2/9 Scores 02/08/2019 01/22/2019 12/01/2018 09/29/2018 08/09/2018 03/15/2018 03/02/2018  PHQ - 2 Score 1 0 0 0 0 0 3  PHQ- 9 Score - - - - - - 9     Fall Risk Fall Risk  02/08/2019 01/22/2019 12/01/2018 08/09/2018 03/15/2018  Falls in the past year? 1 0 1 0 Yes  Number falls in past yr: 0 - 0 - 1  Injury with Fall? 1 - 1 - No  Comment - - Swollen left ankle. - -  Risk for fall due to : - - - - -  Follow up Falls prevention discussed - - - -  Comment get rid of all throw rugs in the house, adequate lighting in the walkways and grab bars in the bathroom - - - -     Objective:  Jenna [name] seemed alert and oriented and she participated appropriately during our telephone visit.  Blood Pressure Weight BMI  BP Readings from Last 3 Encounters:  01/25/19 (!) 145/74  01/22/19 (!) 159/71   12/01/18 (!) 154/80   Wt Readings from Last 3 Encounters:  01/25/19 210 lb 9.6 oz (95.5 kg)  01/22/19 211 lb (95.7 kg)  12/01/18 209 lb 9.6 oz (95.1 kg)   BMI Readings from Last 1 Encounters:  01/25/19 37.31 kg/m    *Unable to obtain current vital signs, weight, and BMI due to telephone visit type  Hearing/Vision  . Jenna [name] did not seem to have difficulty with hearing/understanding during the telephone conversation . Reports that she has had a formal eye exam by an eye care professional within the past year . Reports that she has not had a formal hearing evaluation within the past year *Unable to fully assess hearing and vision during telephone visit type  Cognitive Function: 6CIT Screen 02/08/2019  What Year? 0 points  What month? 0 points  What time? 0 points  Count back from 20 0 points  Months in reverse 2 points  Repeat phrase 2 points  Total Score 4   (Normal:0-7, Significant for Dysfunction: >8)  Normal Cognitive Function Screening: Yes   Immunization & Health Maintenance Record Immunization History  Administered Date(s)  Administered  . Pneumococcal Conjugate-13 02/21/2013  . Pneumococcal Polysaccharide-23 05/24/2006  . Td 09/21/2004  . Zoster Recombinat (Shingrix) 01/23/2019    Health Maintenance  Topic Date Due  . MAMMOGRAM  10/20/2017  . INFLUENZA VACCINE  12/23/2018  . TETANUS/TDAP  01/22/2020 (Originally 09/22/2014)  . DEXA SCAN  11/17/2019  . PNA vac Low Risk Adult  Completed       Assessment  This is a routine wellness examination for Jenna [name].  Health Maintenance: Due or Overdue Health Maintenance Due  Topic Date Due  . MAMMOGRAM  10/20/2017  . INFLUENZA VACCINE  12/23/2018    Jenna [name] does not need a referral for Community Assistance: Care Management:   no Social Work:    no Prescription Assistance:  no Nutrition/Diabetes Education:  no   Plan:  Personalized Goals Goals Addressed            This Visit's Progress   .  DIET - INCREASE WATER INTAKE       Try to drink 6-8 glasses of water daily.      Personalized Health Maintenance & Screening Recommendations  Td vaccine Pt declines Flu vaccine and Mammogram  Lung Cancer Screening Recommended: no (Low Dose CT Chest recommended if Age 76-80 years, 30 pack-year currently smoking OR have quit w/in past 15 years) Hepatitis C Screening recommended: no HIV Screening recommended: no  Advanced Directives: Written information was not prepared per patient's request.  Referrals & Orders No orders of the defined types were placed in this encounter.   Follow-up Plan . Follow-up with Dettinger, Fransisca Kaufmann, MD as planned . Consider TDAP at your next visit with your PCP   I have personally reviewed and noted the following in the patient's chart:   . Medical and social history . Use of alcohol, tobacco or illicit drugs  . Current medications and supplements . Functional ability and status . Nutritional status . Physical activity . Advanced directives . List of other physicians . Hospitalizations, surgeries, and ER visits in previous 12 months . Vitals . Screenings to include cognitive, depression, and falls . Referrals and appointments  In addition, I have reviewed and discussed with Jenna [name] certain preventive protocols, quality metrics, and best practice recommendations. A written personalized care plan for preventive services as well as general preventive health recommendations is available and can be mailed to the patient at her request.      Marylin Crosby, LPN  [id number]

## 2019-02-09 ENCOUNTER — Ambulatory Visit: Payer: Medicare Other | Admitting: Cardiology

## 2019-02-12 ENCOUNTER — Other Ambulatory Visit: Payer: Self-pay | Admitting: Family Medicine

## 2019-02-27 ENCOUNTER — Ambulatory Visit (INDEPENDENT_AMBULATORY_CARE_PROVIDER_SITE_OTHER): Payer: Medicare Other

## 2019-02-27 ENCOUNTER — Other Ambulatory Visit: Payer: Self-pay

## 2019-02-27 DIAGNOSIS — E538 Deficiency of other specified B group vitamins: Secondary | ICD-10-CM

## 2019-02-27 NOTE — Progress Notes (Signed)
Cyanocobalamin injection given to right deltoid.  Patient tolerated well. 

## 2019-03-15 ENCOUNTER — Other Ambulatory Visit: Payer: Self-pay | Admitting: Family Medicine

## 2019-03-20 ENCOUNTER — Encounter: Payer: Self-pay | Admitting: Cardiology

## 2019-03-20 ENCOUNTER — Ambulatory Visit (INDEPENDENT_AMBULATORY_CARE_PROVIDER_SITE_OTHER): Payer: Medicare Other | Admitting: Cardiology

## 2019-03-20 ENCOUNTER — Other Ambulatory Visit: Payer: Self-pay

## 2019-03-20 VITALS — BP 139/78 | HR 67 | Ht 66.0 in | Wt 210.6 lb

## 2019-03-20 DIAGNOSIS — I480 Paroxysmal atrial fibrillation: Secondary | ICD-10-CM

## 2019-03-20 DIAGNOSIS — I890 Lymphedema, not elsewhere classified: Secondary | ICD-10-CM

## 2019-03-20 DIAGNOSIS — I10 Essential (primary) hypertension: Secondary | ICD-10-CM

## 2019-03-20 DIAGNOSIS — I2510 Atherosclerotic heart disease of native coronary artery without angina pectoris: Secondary | ICD-10-CM

## 2019-03-20 NOTE — Patient Instructions (Addendum)

## 2019-03-20 NOTE — Progress Notes (Signed)
Cardiology Office Note  Date: 03/20/2019   ID: Jenna [name], Jenna [name] June 19, 1935, MRN [id number]  PCP:  Dettinger, Fransisca Kaufmann, MD  Cardiologist:  Rozann Lesches, MD Electrophysiologist:  None   Chief Complaint  Patient presents with  . Cardiac follow-up    History of Present Illness: Jenna [name] is an 83 y.o. female last seen in October 2019.  She presents for office follow-up.  I reviewed interval records.  Was hospitalized back in May with chest pain and increase in high-sensitivity troponin I levels concerning for ACS.  Fortunately, cardiac catheterization at that time demonstrated mild nonobstructive CAD with recommendation to continue medical therapy.  She was in normal sinus rhythm at that time.  Resents for follow-up today, states that she has done well overall.  Interestingly, she broke out with a herpes zoster rash shortly after her hospital stay in May, question whether that could have been related to her chest discomfort.  I reviewed her medications which are outlined below and stable from a cardiac perspective.  She does not report any significant palpitations and has done well in terms of atrial fibrillation control.  No reported bleeding problems on Xarelto.  I reviewed her interval lab work.  Past Medical History:  Diagnosis Date  . Cataract   . DDD (degenerative disc disease)   . Fibromyalgia   . Herpes zoster   . History of skin cancer   . Hyperlipidemia   . Hypertension   . Hypothyroidism   . Iron deficiency anemia due to chronic blood loss 04/06/2018  . OA (osteoarthritis)   . OSA on CPAP 01/16/2018   Mild OSA overall with an AHI of 5.8/h but during REM sleep moderate with an AHI of 28/h.  Oxygen saturations dropped to 76% during respiratory events.  Now on CPAP at 9 cm H2O   . Osteoporosis   . Palpitations   . Pre-diabetes   . PUD (peptic ulcer disease)   . Skin cancer of nose    Jenna [name]    Past Surgical History:  Procedure Laterality Date   . ABDOMINAL HYSTERECTOMY     partial  . BIOPSY  04/24/2018   Procedure: BIOPSY;  Surgeon: Rogene Houston, MD;  [facility]: [facility];  Service: Endoscopy;;  gastric body ulcerated surface  . BREAST BIOPSY     Right  . CATARACT EXTRACTION W/PHACO  10/28/2011   Procedure: CATARACT EXTRACTION PHACO AND INTRAOCULAR LENS PLACEMENT (IOC);  Surgeon: Tonny Branch, MD;  Location: [facility];  Service: Ophthalmology;  Laterality: Right;  CDE 18.38  . CATARACT EXTRACTION W/PHACO  02/14/2012   Procedure: CATARACT EXTRACTION PHACO AND INTRAOCULAR LENS PLACEMENT (IOC);  Surgeon: Tonny Branch, MD;  Location: [facility];  Service: Ophthalmology;  Laterality: Left;  CDE 17.20  . CESAREAN SECTION    . COLONOSCOPY N/A 05/31/2013   Procedure: COLONOSCOPY;  Surgeon: Rogene Houston, MD;  [facility]: [facility];  Service: Endoscopy;  Laterality: N/A;  240  . COLONOSCOPY N/A 04/24/2018   Procedure: COLONOSCOPY;  Surgeon: Rogene Houston, MD;  [facility]: [facility];  Service: Endoscopy;  Laterality: N/A;  9:30  . ESOPHAGOGASTRODUODENOSCOPY N/A 04/24/2018   Procedure: ESOPHAGOGASTRODUODENOSCOPY (EGD);  Surgeon: Rogene Houston, MD;  Location: [facility];  Service: Endoscopy;  Laterality: N/A;  . EYE SURGERY     skin removal   . FRACTURE SURGERY  1952   fractured right arm  . LEFT HEART CATH AND CORONARY ANGIOGRAPHY N/A 09/25/2018   Procedure: LEFT  HEART CATH AND CORONARY ANGIOGRAPHY;  Surgeon: Jettie Booze, MD;  Location: [facility];  Service: Cardiovascular;  Laterality: N/A;    Current Outpatient Medications  Medication Sig Dispense Refill  . acetaminophen (TYLENOL) 500 MG tablet Take 500 mg by mouth daily as needed for moderate pain or headache.    . Cholecalciferol (VITAMIN D3) 2000 UNITS TABS Take 2,000 Units by mouth daily.     [name] COLCRYS 0.6 MG tablet TAKE 1 TABLET DAILY AS NEEDED GOUT FLARE UP. 30 tablet 0  . furosemide (LASIX) 40 MG tablet Take 40 mg by mouth as needed.    . gabapentin  (NEURONTIN) 100 MG capsule Take 100 mg by mouth daily.    . hydrocortisone (ANUSOL-HC) 2.5 % rectal cream APPLY RECTALLY DAILY AS NEEDED FOR HEMMORHOIDS OR ITCHING. 30 g 0  . levothyroxine (SYNTHROID) 75 MCG tablet Take 1 tablet (75 mcg total) by mouth daily. 90 tablet 3  . metoprolol succinate (TOPROL-XL) 50 MG 24 hr tablet TAKE 1 TABLET DAILY WITH OR IMMEDIATELY FOLLOWING A MEAL. 90 tablet 3  . nitroGLYCERIN (NITROSTAT) 0.4 MG SL tablet Place 1 tablet (0.4 mg total) under the tongue every 5 (five) minutes x 3 doses as needed for chest pain. 30 tablet 0  . pantoprazole (PROTONIX) 40 MG tablet Take 1 tablet (40 mg total) by mouth daily. 30 tablet 3  . XARELTO 20 MG TABS tablet TAKE 1 TABLET ONCE A DAY WITH SUPPER. 30 tablet 2   Current Facility-Administered Medications  Medication Dose Route Frequency Provider Last Rate Last Dose  . cyanocobalamin ((VITAMIN B-12)) injection 1,000 mcg  1,000 mcg Intramuscular Q30 days Chipper Herb, MD   1,000 mcg at 02/27/19 1433   Allergies:  Daypro [oxaprozin], Diclofenac sodium, Effexor [venlafaxine hydrochloride], Prozac [fluoxetine hcl], Amitriptyline, Cymbalta [duloxetine hcl], Lipitor [atorvastatin calcium], Penicillins, Savella [milnacipran hcl], Sulfa antibiotics, and Zocor [simvastatin]   Social History: The patient  reports that she has never smoked. She has never used smokeless tobacco. She reports that she does not drink alcohol or use drugs.   ROS:  Please see the history of present illness. Otherwise, complete review of systems is positive for none.  All other systems are reviewed and negative.   Physical Exam: VS:  BP 139/78   Pulse 67   Ht 5\' 6"  (1.676 m)   Wt 210 lb 9.6 oz (95.5 kg)   SpO2 99%   BMI 33.99 kg/m , BMI Body mass index is 33.99 kg/m.  Wt Readings from Last 3 Encounters:  03/20/19 210 lb 9.6 oz (95.5 kg)  01/25/19 210 lb 9.6 oz (95.5 kg)  01/22/19 211 lb (95.7 kg)    General: Patient appears comfortable at rest.  HEENT: Conjunctiva and lids normal, wearing a mask. Neck: Supple, no elevated JVP or carotid bruits, no thyromegaly. Lungs: Clear to auscultation, nonlabored breathing at rest. Cardiac: Regular rate and rhythm, no S3 or significant systolic murmur. Abdomen: Soft, nontender, no hepatomegaly, bowel sounds present, no guarding or rebound. Extremities: 2+ lower leg edema, distal pulses 2+. Skin: Warm and dry. Musculoskeletal: No kyphosis. Neuropsychiatric: Alert and oriented x3, affect grossly appropriate.  ECG:  An ECG dated 09/23/2018 was personally reviewed today and demonstrated:  Sinus rhythm with nonspecific T wave changes.  Recent Labwork: 08/09/2018: TSH 2.970 10/30/2018: ALT 14; AST 15; BUN 11; Creatinine, Ser 0.70; Hemoglobin 13.0; Platelets 195; Potassium 4.4; Sodium 134     Component Value Date/Time   CHOL 222 (H) 01/23/2019 1243   TRIG  169 (H) 01/23/2019 1243   TRIG 161 (H) 10/14/2016 1010   HDL 57 01/23/2019 1243   HDL 54 10/14/2016 1010   CHOLHDL 3.9 01/23/2019 1243   CHOLHDL 3.6 10/16/2008 0540   VLDL 23 10/16/2008 0540   LDLCALC 135 (H) 01/23/2019 1243   LDLCALC 138 (H) 02/26/2014 1001    Other Studies Reviewed Today:  Cardiac catheterization 09/25/2018:  Prox LAD lesion is 20% stenosed.  Mid RCA lesion is 10% stenosed.  The left ventricular systolic function is normal.  LV end diastolic pressure is normal.  The left ventricular ejection fraction is 55-65% by visual estimate.  There is no aortic valve stenosis.   Nonobstructive CAD.    COntinue preventive therapy.   Restart Eliquis tomorrow, 5/5.  Echocardiogram 03/29/2018: Study Conclusions  - Left ventricle: The cavity size was normal. Wall thickness was   increased in a pattern of mild LVH. Systolic function was   vigorous. The estimated ejection fraction was in the range of 65%   to 70%. Wall motion was normal; there were no regional wall   motion abnormalities. Left ventricular diastolic  function   parameters were normal. - Aortic valve: There was mild regurgitation. Valve area (VTI):   2.92 cm^2. Valve area (Vmax): 2.78 cm^2. Valve area (Vmean): 2.95   cm^2.  Assessment and Plan:  1.  Paroxysmal atrial fibrillation/flutter.  She continues on Xarelto for stroke prophylaxis with CHADSVASC score of 3.  Maintaining sinus rhythm at this point on Toprol-XL.  Continue observation.  2.  Leg edema/lymphedema.  We have discussed use of compression stockings, she also continues on Lasix.  Echocardiogram from November of last year showed preserved LVEF at 65 to 70%.  3.  Essential hypertension, systolic blood pressures in the 130s today.  Continue to follow with PCP.  4.  Mild nonobstructive coronary atherosclerosis by cardiac catheterization in May of this year.  Medication Adjustments/Labs and Tests Ordered: Current medicines are reviewed at length with the patient today.  Concerns regarding medicines are outlined above.   Tests Ordered: No orders of the defined types were placed in this encounter.   Medication Changes: No orders of the defined types were placed in this encounter.   Disposition:  Follow up 6 months in the [place] office.  Signed, Satira Sark, MD, [facility] 03/20/2019 4:25 PM    [facility] [place], [place], [place] [address] Phone: [phone]; Fax: [phone]

## 2019-03-26 ENCOUNTER — Telehealth: Payer: Self-pay | Admitting: Family Medicine

## 2019-03-27 ENCOUNTER — Other Ambulatory Visit: Payer: Self-pay

## 2019-03-27 ENCOUNTER — Ambulatory Visit (INDEPENDENT_AMBULATORY_CARE_PROVIDER_SITE_OTHER): Payer: Medicare Other | Admitting: *Deleted

## 2019-03-27 DIAGNOSIS — Z09 Encounter for follow-up examination after completed treatment for conditions other than malignant neoplasm: Secondary | ICD-10-CM

## 2019-03-27 DIAGNOSIS — Z23 Encounter for immunization: Secondary | ICD-10-CM

## 2019-03-27 DIAGNOSIS — E538 Deficiency of other specified B group vitamins: Secondary | ICD-10-CM

## 2019-03-27 NOTE — Progress Notes (Signed)
B-12 given in left arm.  Shingrix given in right arm.

## 2019-04-24 ENCOUNTER — Other Ambulatory Visit: Payer: Self-pay | Admitting: Family Medicine

## 2019-04-25 ENCOUNTER — Other Ambulatory Visit: Payer: Self-pay

## 2019-04-26 ENCOUNTER — Ambulatory Visit (INDEPENDENT_AMBULATORY_CARE_PROVIDER_SITE_OTHER): Payer: Medicare Other

## 2019-04-26 DIAGNOSIS — E538 Deficiency of other specified B group vitamins: Secondary | ICD-10-CM

## 2019-04-26 NOTE — Progress Notes (Signed)
Cyanocobalamin injection given to right deltoid.  Patient tolerated wel.

## 2019-05-28 ENCOUNTER — Other Ambulatory Visit: Payer: Self-pay

## 2019-05-28 ENCOUNTER — Ambulatory Visit (INDEPENDENT_AMBULATORY_CARE_PROVIDER_SITE_OTHER): Payer: Medicare Other | Admitting: *Deleted

## 2019-05-28 DIAGNOSIS — E538 Deficiency of other specified B group vitamins: Secondary | ICD-10-CM | POA: Diagnosis not present

## 2019-05-28 MED ORDER — CYANOCOBALAMIN 1000 MCG/ML IJ SOLN
1000.0000 ug | INTRAMUSCULAR | Status: AC
  Administered 2019-05-28 – 2020-05-05 (×11): 1000 ug via INTRAMUSCULAR

## 2019-05-28 NOTE — Progress Notes (Signed)
Vitamin b12 injection given and tolerated well.  

## 2019-06-14 ENCOUNTER — Other Ambulatory Visit: Payer: Self-pay | Admitting: Family Medicine

## 2019-06-14 NOTE — Telephone Encounter (Signed)
Pt has f/u appt with PCP 07/25/19

## 2019-06-22 ENCOUNTER — Other Ambulatory Visit: Payer: Self-pay | Admitting: Family Medicine

## 2019-06-27 ENCOUNTER — Ambulatory Visit (INDEPENDENT_AMBULATORY_CARE_PROVIDER_SITE_OTHER): Payer: Medicare Other

## 2019-06-27 ENCOUNTER — Other Ambulatory Visit: Payer: Self-pay

## 2019-06-27 DIAGNOSIS — E538 Deficiency of other specified B group vitamins: Secondary | ICD-10-CM

## 2019-06-29 ENCOUNTER — Ambulatory Visit: Payer: Medicare Other

## 2019-07-13 ENCOUNTER — Other Ambulatory Visit: Payer: Self-pay | Admitting: Family Medicine

## 2019-07-16 ENCOUNTER — Other Ambulatory Visit: Payer: Self-pay | Admitting: Family Medicine

## 2019-07-23 ENCOUNTER — Other Ambulatory Visit (INDEPENDENT_AMBULATORY_CARE_PROVIDER_SITE_OTHER): Payer: Self-pay | Admitting: Internal Medicine

## 2019-07-23 DIAGNOSIS — K219 Gastro-esophageal reflux disease without esophagitis: Secondary | ICD-10-CM

## 2019-07-25 ENCOUNTER — Ambulatory Visit: Payer: Medicare Other | Admitting: Family Medicine

## 2019-07-26 ENCOUNTER — Ambulatory Visit: Payer: Medicare Other

## 2019-07-26 ENCOUNTER — Other Ambulatory Visit: Payer: Self-pay

## 2019-07-26 DIAGNOSIS — E538 Deficiency of other specified B group vitamins: Secondary | ICD-10-CM

## 2019-07-26 NOTE — Progress Notes (Signed)
Cyanocobalamin injection given to left deltoid.  Patient tolerated well. 

## 2019-08-13 ENCOUNTER — Other Ambulatory Visit: Payer: Self-pay | Admitting: Family Medicine

## 2019-08-16 ENCOUNTER — Telehealth: Payer: Self-pay | Admitting: Family Medicine

## 2019-08-16 NOTE — Chronic Care Management (AMB) (Signed)
  Chronic Care Management   Note  08/16/2019 Name: Jenna [name] MRN: [id number] DOB: 15-Oct-1935  Jenna [name] is a 84 y.o. year old female who is a primary care patient of Dettinger, Fransisca Kaufmann, MD. I reached out to Jenna [name] by phone today in response to a referral sent by Jenna [name]'s health plan.     Jenna [name] was given information about Chronic Care Management services today including:  1. CCM service includes personalized support from designated clinical staff supervised by her physician, including individualized plan of care and coordination with other care providers 2. 24/7 contact phone numbers for assistance for urgent and routine care needs. 3. Service will only be billed when office clinical staff spend 20 minutes or more in a month to coordinate care. 4. Only one practitioner may furnish and bill the service in a calendar month. 5. The patient may stop CCM services at any time (effective at the end of the month) by phone call to the office staff. 6. The patient will be responsible for cost sharing (co-pay) of up to 20% of the service fee (after annual deductible is met).  Patient agreed to services and verbal consent obtained.   Follow up plan: Telephone appointment with care management team member scheduled for:03/10/2020.  [place], [place] [id number] Direct Dial: [phone] [email or web address]

## 2019-08-20 ENCOUNTER — Other Ambulatory Visit: Payer: Self-pay

## 2019-08-20 ENCOUNTER — Encounter: Payer: Self-pay | Admitting: Family Medicine

## 2019-08-20 ENCOUNTER — Ambulatory Visit (INDEPENDENT_AMBULATORY_CARE_PROVIDER_SITE_OTHER): Payer: Medicare Other | Admitting: Family Medicine

## 2019-08-20 VITALS — BP 136/58 | HR 68 | Temp 98.4°F | Ht 66.0 in | Wt 212.0 lb

## 2019-08-20 DIAGNOSIS — I10 Essential (primary) hypertension: Secondary | ICD-10-CM | POA: Diagnosis not present

## 2019-08-20 DIAGNOSIS — I480 Paroxysmal atrial fibrillation: Secondary | ICD-10-CM

## 2019-08-20 DIAGNOSIS — R7303 Prediabetes: Secondary | ICD-10-CM | POA: Diagnosis not present

## 2019-08-20 DIAGNOSIS — E782 Mixed hyperlipidemia: Secondary | ICD-10-CM

## 2019-08-20 DIAGNOSIS — E039 Hypothyroidism, unspecified: Secondary | ICD-10-CM | POA: Diagnosis not present

## 2019-08-20 DIAGNOSIS — G629 Polyneuropathy, unspecified: Secondary | ICD-10-CM

## 2019-08-20 LAB — BAYER DCA HB A1C WAIVED: HB A1C (BAYER DCA - WAIVED): 6.9 % (ref <7.0)

## 2019-08-20 MED ORDER — GABAPENTIN 100 MG PO CAPS: 100.0000 mg | ORAL_CAPSULE | Freq: Three times a day (TID) | ORAL | 3 refills | Status: DC

## 2019-08-20 MED ORDER — RIVAROXABAN 20 MG PO TABS: 20.0000 mg | ORAL_TABLET | Freq: Every day | ORAL | 5 refills | Status: DC

## 2019-08-20 NOTE — Progress Notes (Signed)
BP (!) 136/58   Pulse 68   Temp 98.4 F (36.9 C)   Ht 5' 6"  (1.676 m)   Wt 212 lb (96.2 kg)   SpO2 94%   BMI 34.22 kg/m    Subjective:   Patient ID: Jenna [name], female    DOB: Feb 15, 1936, 84 y.o.   MRN: [id number]  HPI: Jenna [name] is a 84 y.o. female presenting on 08/20/2019 for Medical Management of Chronic Issues (55m) and Hypertension   HPI Hypertension Patient is currently on metoprolol, and their blood pressure today is 136/58. Patient denies any lightheadedness or dizziness. Patient denies headaches, blurred vision, chest pains, shortness of breath, or weakness. Denies any side effects from medication and is content with current medication.   Hyperlipidemia Patient is coming in for recheck of his hyperlipidemia. The patient is currently taking no medication currently. They deny any issues with myalgias or history of liver damage from it. They deny any focal numbness or weakness or chest pain.   Hypothyroidism recheck Patient is coming in for thyroid recheck today as well. They deny any issues with hair changes or heat or cold problems or diarrhea or constipation. They deny any chest pain or palpitations. They are currently on levothyroxine 75 micrograms   Prediabetes Patient comes in today for recheck of his diabetes. Patient has been currently taking no medication currently. Patient is not currently on an ACE inhibitor/ARB. Patient has not seen an ophthalmologist this year. Patient denies any issues with their feet.   Patient has neuropathy and burning and tingling in both of her legs from sciatic and back disease.  She also has some burning and tingling on her left torso and axilla from her previous shingles.  Relevant past medical, surgical, family and social history reviewed and updated as indicated. Interim medical history since our last visit reviewed. Allergies and medications reviewed and updated.  Review of Systems  Constitutional: Negative for chills  and fever.  Eyes: Negative for visual disturbance.  Respiratory: Negative for chest tightness and shortness of breath.   Cardiovascular: Negative for chest pain and leg swelling.  Skin: Negative for rash.  Neurological: Positive for numbness. Negative for weakness, light-headedness and headaches.  Psychiatric/Behavioral: Negative for agitation, behavioral problems, self-injury, sleep disturbance and suicidal ideas.  All other systems reviewed and are negative.   Per HPI unless specifically indicated above   Allergies as of 08/20/2019      Reactions   Daypro [oxaprozin]    Unknown reaction   Diclofenac Sodium    Unknown reaction   Effexor [venlafaxine Hydrochloride]    Unknown reaction   Prozac [fluoxetine Hcl]    Unknown reaction   Amitriptyline Rash   Cymbalta [duloxetine Hcl] Nausea Only   Lipitor [atorvastatin Calcium] Other (See Comments)   Leg aches   Penicillins Rash   Has patient had a PCN reaction causing immediate rash, facial/tongue/throat swelling, SOB or lightheadedness with hypotension: Yes Has patient had a PCN reaction causing severe rash involving mucus membranes or skin necrosis: No Has patient had a PCN reaction that required hospitalization: No Has patient had a PCN reaction occurring within the last 10 years: No If all of the above answers are "NO", then may proceed with Cephalosporin use.   Savella [milnacipran Hcl] Rash   Sulfa Antibiotics Rash   Zocor [simvastatin] Other (See Comments)   Hip pain      Medication List       Accurate as of August 20, 2019 11:27 AM.  If you have any questions, ask your nurse or doctor.        acetaminophen 500 MG tablet Commonly known as: TYLENOL Take 500 mg by mouth daily as needed for moderate pain or headache.   Colcrys 0.6 MG tablet Generic drug: colchicine TAKE 1 TABLET DAILY AS NEEDED FOR GOUT FLARE UP   furosemide 40 MG tablet Commonly known as: LASIX Take 40 mg by mouth as needed.   gabapentin 100 MG  capsule Commonly known as: NEURONTIN TAKE 1 CAPSULE 3 TIMES DAILY.   hydrocortisone 2.5 % rectal cream Commonly known as: ANUSOL-HC APPLY RECTALLY DAILY AS NEEDED FOR HEMMORHOIDS OR ITCHING.   levothyroxine 75 MCG tablet Commonly known as: SYNTHROID Take 1 tablet (75 mcg total) by mouth daily.   metoprolol succinate 50 MG 24 hr tablet Commonly known as: TOPROL-XL TAKE 1 TABLET DAILY WITH OR IMMEDIATELY FOLLOWING A MEAL.   nitroGLYCERIN 0.4 MG SL tablet Commonly known as: NITROSTAT Place 1 tablet (0.4 mg total) under the tongue every 5 (five) minutes x 3 doses as needed for chest pain.   pantoprazole 40 MG tablet Commonly known as: PROTONIX TAKE 1 TABLET BY MOUTH DAILY BEFORE BREAKFAST.   Vitamin D3 50 MCG (2000 UT) Tabs Take 2,000 Units by mouth daily.   Xarelto 20 MG Tabs tablet Generic drug: rivaroxaban TAKE 1 TABLET ONCE A DAY WITH SUPPER.        Objective:   BP (!) 136/58   Pulse 68   Temp 98.4 F (36.9 C)   Ht 5' 6"  (1.676 m)   Wt 212 lb (96.2 kg)   SpO2 94%   BMI 34.22 kg/m   Wt Readings from Last 3 Encounters:  08/20/19 212 lb (96.2 kg)  03/20/19 210 lb 9.6 oz (95.5 kg)  01/25/19 210 lb 9.6 oz (95.5 kg)    Physical Exam Vitals and nursing note reviewed.  Constitutional:      General: She is not in acute distress.    Appearance: She is well-developed. She is not diaphoretic.  Eyes:     Conjunctiva/sclera: Conjunctivae normal.  Cardiovascular:     Rate and Rhythm: Normal rate and regular rhythm.     Heart sounds: Normal heart sounds. No murmur.  Pulmonary:     Effort: Pulmonary effort is normal. No respiratory distress.     Breath sounds: Normal breath sounds. No wheezing.  Skin:    General: Skin is warm and dry.     Findings: No rash.  Neurological:     Mental Status: She is alert and oriented to person, place, and time.     Coordination: Coordination normal.  Psychiatric:        Behavior: Behavior normal.       Assessment & Plan:    Problem List Items Addressed This Visit      Cardiovascular and Mediastinum   AF (paroxysmal atrial fibrillation) (HCC)   Relevant Medications   rivaroxaban (XARELTO) 20 MG TABS tablet   Hypertension   Relevant Medications   rivaroxaban (XARELTO) 20 MG TABS tablet   Other Relevant Orders   CMP14+EGFR (Completed)   CBC with Differential/Platelet (Completed)   Lipid panel (Completed)   TSH (Completed)     Endocrine   Hypothyroidism   Relevant Orders   TSH (Completed)     Other   Hyperlipidemia - Primary   Relevant Medications   rivaroxaban (XARELTO) 20 MG TABS tablet   Other Relevant Orders   CMP14+EGFR (Completed)   CBC with Differential/Platelet (Completed)  Lipid panel (Completed)   TSH (Completed)   Pre-diabetes   Relevant Orders   Bayer DCA Hb A1c Waived (Completed)    Other Visit Diagnoses    Neuropathy       Relevant Orders   Vitamin B12 (Completed)      Patient describes right lower hip pain/back pain which sounds like it is probably due to sciatic nerve, mild today but will watch in the future.  Patient still has a little bit of tingling and itching from where the nerve was from her shingles 1 year ago under her left axilla.  Recommended icy hot and continue gabapentin, may be permanent or may heal. Follow up plan: Return in about 3 months (around 11/20/2019), or if symptoms worsen or fail to improve, for Prediabetes recheck.  Counseling provided for all of the vaccine components No orders of the defined types were placed in this encounter.   Caryl Pina, MD [facility] 08/20/2019, 11:27 AM

## 2019-08-21 LAB — CMP14+EGFR
ALT: 16 IU/L (ref 0–32)
AST: 24 IU/L (ref 0–40)
Albumin/Globulin Ratio: 2.3 — ABNORMAL HIGH (ref 1.2–2.2)
Albumin: 4.2 g/dL (ref 3.6–4.6)
Alkaline Phosphatase: 119 IU/L — ABNORMAL HIGH (ref 39–117)
BUN/Creatinine Ratio: 18 (ref 12–28)
BUN: 12 mg/dL (ref 8–27)
Bilirubin Total: 0.5 mg/dL (ref 0.0–1.2)
CO2: 26 mmol/L (ref 20–29)
Calcium: 8.9 mg/dL (ref 8.7–10.3)
Chloride: 101 mmol/L (ref 96–106)
Creatinine, Ser: 0.68 mg/dL (ref 0.57–1.00)
GFR calc Af Amer: 94 mL/min/{1.73_m2} (ref >59)
GFR calc non Af Amer: 82 mL/min/{1.73_m2} (ref >59)
Globulin, Total: 1.8 g/dL (ref 1.5–4.5)
Glucose: 111 mg/dL — ABNORMAL HIGH (ref 65–99)
Potassium: 4.7 mmol/L (ref 3.5–5.2)
Sodium: 139 mmol/L (ref 134–144)
Total Protein: 6 g/dL (ref 6.0–8.5)

## 2019-08-21 LAB — CBC WITH DIFFERENTIAL/PLATELET
Basophils Absolute: 0 10*3/uL (ref 0.0–0.2)
Basos: 1 %
EOS (ABSOLUTE): 0.1 10*3/uL (ref 0.0–0.4)
Eos: 2 %
Hematocrit: 32.4 % — ABNORMAL LOW (ref 34.0–46.6)
Hemoglobin: 10.5 g/dL — ABNORMAL LOW (ref 11.1–15.9)
Immature Grans (Abs): 0 10*3/uL (ref 0.0–0.1)
Immature Granulocytes: 0 %
Lymphocytes Absolute: 1.3 10*3/uL (ref 0.7–3.1)
Lymphs: 21 %
MCH: 27.3 pg (ref 26.6–33.0)
MCHC: 32.4 g/dL (ref 31.5–35.7)
MCV: 84 fL (ref 79–97)
Monocytes Absolute: 0.7 10*3/uL (ref 0.1–0.9)
Monocytes: 12 %
Neutrophils Absolute: 3.8 10*3/uL (ref 1.4–7.0)
Neutrophils: 64 %
Platelets: 223 10*3/uL (ref 150–450)
RBC: 3.84 x10E6/uL (ref 3.77–5.28)
RDW: 12.8 % (ref 11.7–15.4)
WBC: 5.9 10*3/uL (ref 3.4–10.8)

## 2019-08-21 LAB — LIPID PANEL
Chol/HDL Ratio: 3.6 ratio (ref 0.0–4.4)
Cholesterol, Total: 190 mg/dL (ref 100–199)
HDL: 53 mg/dL (ref >39)
LDL Chol Calc (NIH): 111 mg/dL — ABNORMAL HIGH (ref 0–99)
Triglycerides: 147 mg/dL (ref 0–149)
VLDL Cholesterol Cal: 26 mg/dL (ref 5–40)

## 2019-08-21 LAB — VITAMIN B12: Vitamin B-12: 510 pg/mL (ref 232–1245)

## 2019-08-21 LAB — TSH: TSH: 3.25 u[IU]/mL (ref 0.450–4.500)

## 2019-08-27 ENCOUNTER — Ambulatory Visit (INDEPENDENT_AMBULATORY_CARE_PROVIDER_SITE_OTHER): Payer: Medicare Other

## 2019-08-27 ENCOUNTER — Other Ambulatory Visit: Payer: Self-pay

## 2019-08-27 DIAGNOSIS — E538 Deficiency of other specified B group vitamins: Secondary | ICD-10-CM | POA: Diagnosis not present

## 2019-08-27 NOTE — Progress Notes (Signed)
Cyanocobalamin injection given to right deltoid.  Patient tolerated well. 

## 2019-09-27 ENCOUNTER — Other Ambulatory Visit: Payer: Self-pay

## 2019-09-27 ENCOUNTER — Ambulatory Visit (INDEPENDENT_AMBULATORY_CARE_PROVIDER_SITE_OTHER): Payer: Medicare Other | Admitting: *Deleted

## 2019-09-27 DIAGNOSIS — E538 Deficiency of other specified B group vitamins: Secondary | ICD-10-CM

## 2019-09-27 NOTE — Progress Notes (Signed)
Patient in today for B12 injection, tolerated well.

## 2019-10-14 IMAGING — DX DG CERVICAL SPINE COMPLETE 4+V
5 series · 5 of 5 positions shown · non-contrast
Comparison: Cervical spine films of 02/26/2014

CLINICAL DATA: Left shoulder pain, neck pain

EXAM:
CERVICAL SPINE - COMPLETE 4+ VIEW

[c-spine lat]
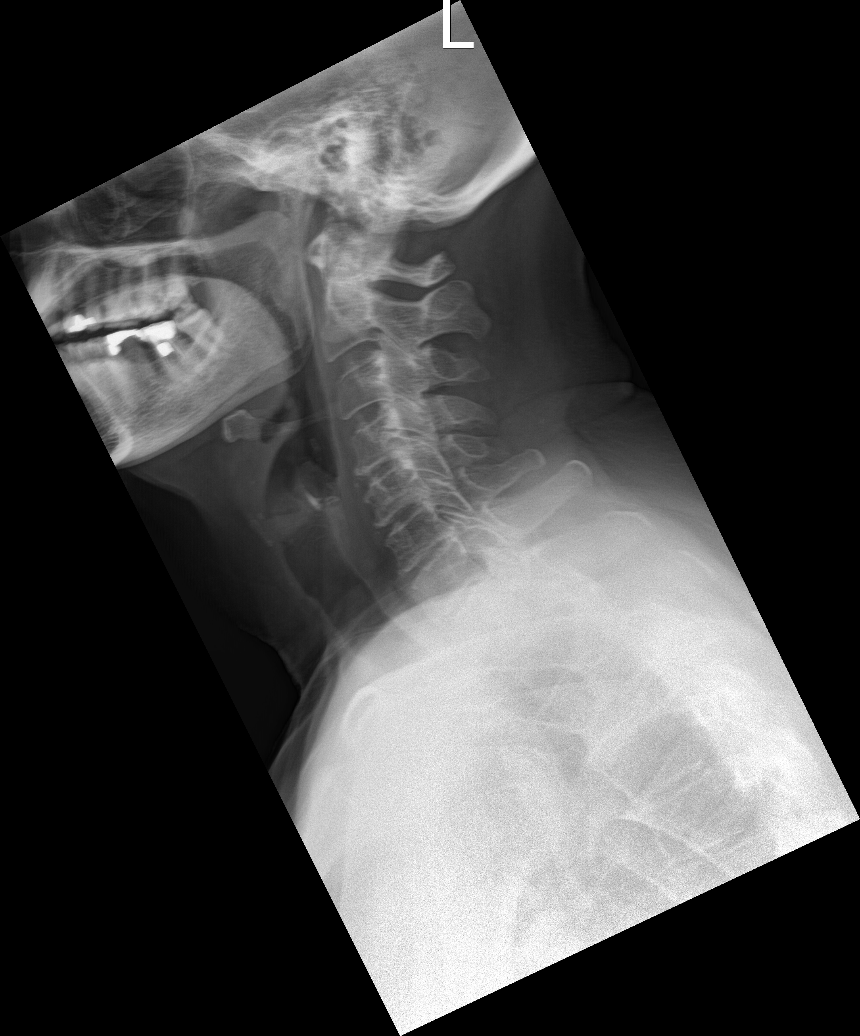

[c-spine obl (1 of 2)]
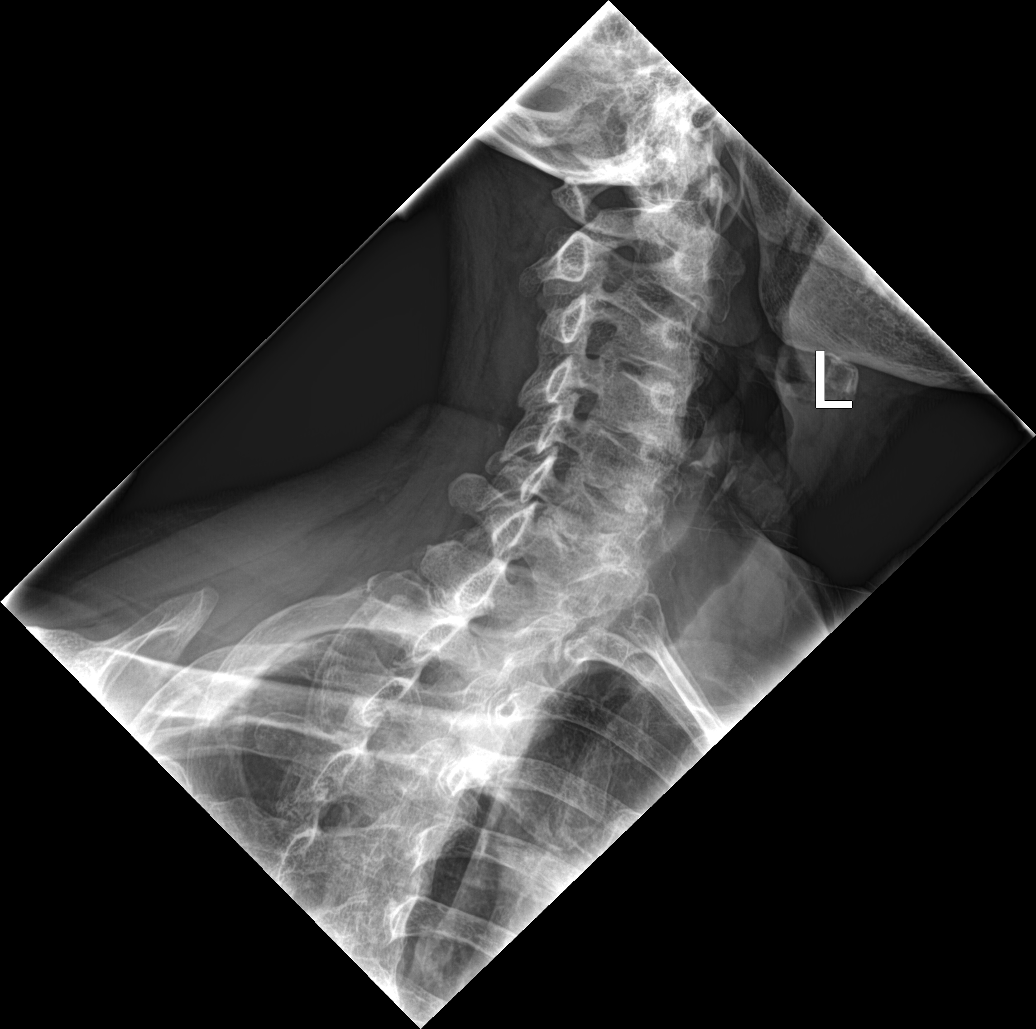

[c-spine obl (2 of 2)]
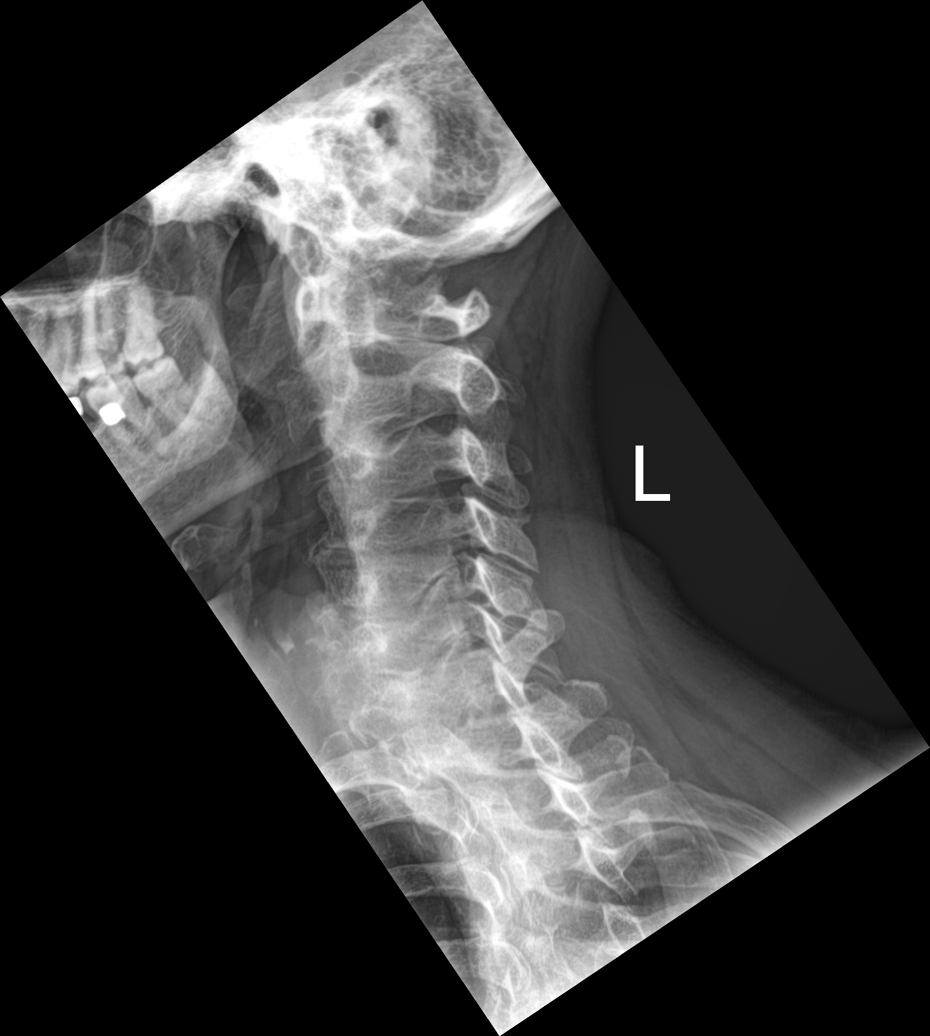

[c-spine ap]
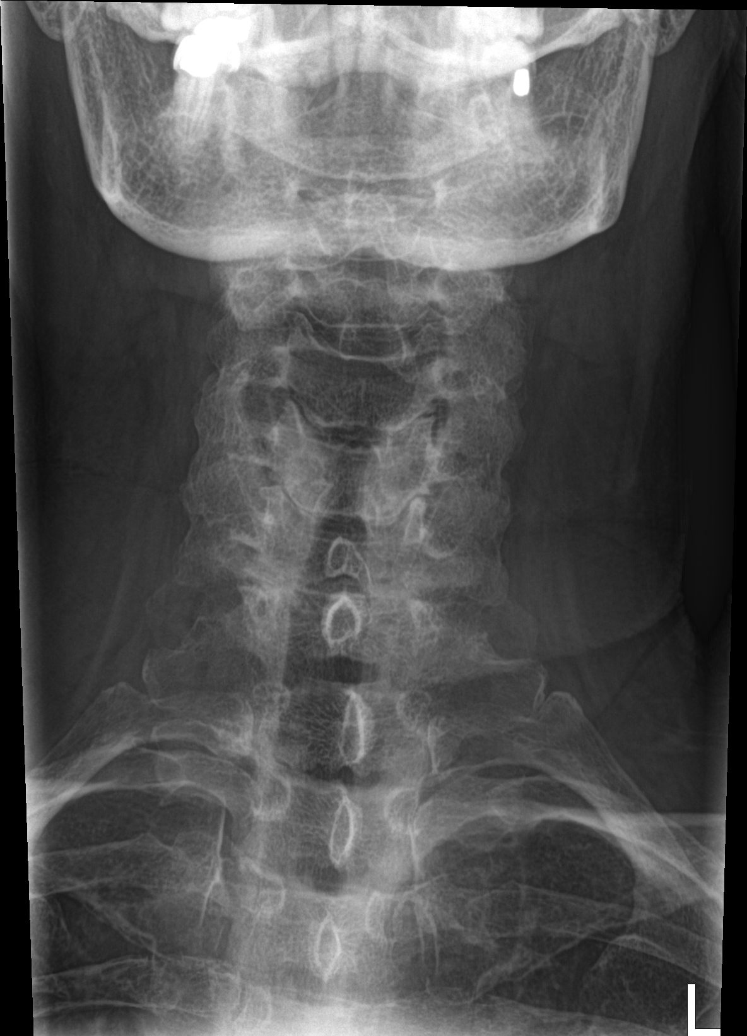

[c-spine open mouth]
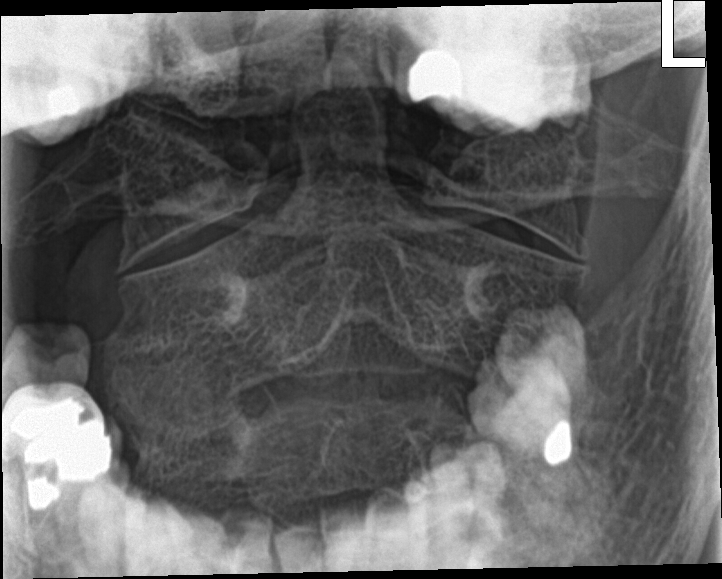

[5 of 5 positions shown; findings below may reference images not displayed]

FINDINGS: The cervical vertebrae remain normal in alignment. There is mild
degenerative disc disease at C4-5, C5-6, and C6-7 levels where there
is loss of disc space and sclerosis with mild spurring. No
compression deformity is seen. No prevertebral soft tissue swelling
is noted. On oblique views, there is some foraminal narrowing
bilaterally at C5-6 and C6-7 levels. The odontoid process is intact.
The lung apices are clear.
IMPRESSION: 1. Degenerative disc disease at C4-5, C5-6, and C6-7 levels.
2. Foraminal narrowing bilaterally primarily at C5-6 and C6-7.

## 2019-10-24 ENCOUNTER — Encounter: Payer: Self-pay | Admitting: Cardiology

## 2019-10-24 ENCOUNTER — Ambulatory Visit (INDEPENDENT_AMBULATORY_CARE_PROVIDER_SITE_OTHER): Payer: Medicare Other | Admitting: Cardiology

## 2019-10-24 ENCOUNTER — Other Ambulatory Visit: Payer: Self-pay

## 2019-10-24 VITALS — BP 156/78 | HR 65 | Ht 66.0 in | Wt 211.0 lb

## 2019-10-24 DIAGNOSIS — I890 Lymphedema, not elsewhere classified: Secondary | ICD-10-CM | POA: Diagnosis not present

## 2019-10-24 DIAGNOSIS — I480 Paroxysmal atrial fibrillation: Secondary | ICD-10-CM | POA: Diagnosis not present

## 2019-10-24 NOTE — Patient Instructions (Addendum)
Medication Instructions:    Your physician recommends that you continue on your current medications as directed. Please refer to the Current Medication list given to you today.  Labwork:  NONE  Testing/Procedures:  NONE  Follow-Up:  Your physician recommends that you schedule a follow-up appointment in: 6 months (office)  Any Other Special Instructions Will Be Listed Below (If Applicable).  If you need a refill on your cardiac medications before your next appointment, please call your pharmacy. 

## 2019-10-24 NOTE — Progress Notes (Signed)
Cardiology Office Note  Date: 10/24/2019   ID: Jenna [name] Nov 09, 1935, MRN [id number]  PCP:  Dettinger, Fransisca Kaufmann, MD  Cardiologist:  Rozann Lesches, MD Electrophysiologist:  None   Chief Complaint  Patient presents with  . Cardiac follow-up    History of Present Illness: Jenna [name] is an 84 y.o. female last seen in October 2020.  She presents for a routine visit.  She does not report any recurring palpitations on current regimen, no exertional chest pain.  I reviewed her medications which are outlined below.  She does not report any spontaneous bleeding problems on Xarelto, otherwise tolerating Toprol-XL.  I personally reviewed her ECG today which shows sinus rhythm with nonspecific T wave changes.  She had lab work in March as outlined below, following with Dr. Warrick Parisian.  Still has recurring leg edema/lymphedema.  She remains on Lasix and I have talked with her about using compression stockings.   Past Medical History:  Diagnosis Date  . Cataract   . DDD (degenerative disc disease)   . Fibromyalgia   . Herpes zoster   . History of skin cancer   . Hyperlipidemia   . Hypertension   . Hypothyroidism   . Iron deficiency anemia due to chronic blood loss 04/06/2018  . OA (osteoarthritis)   . OSA on CPAP 01/16/2018   Mild OSA overall with an AHI of 5.8/h but during REM sleep moderate with an AHI of 28/h.  Oxygen saturations dropped to 76% during respiratory events.  Now on CPAP at 9 cm H2O   . Osteoporosis   . Palpitations   . Pre-diabetes   . PUD (peptic ulcer disease)   . Skin cancer of nose    Jenna [name]    Past Surgical History:  Procedure Laterality Date  . ABDOMINAL HYSTERECTOMY     partial  . BIOPSY  04/24/2018   Procedure: BIOPSY;  Surgeon: Rogene Houston, MD;  [name];  Service: Endoscopy;;  gastric body ulcerated surface  . BREAST BIOPSY     Right  . CATARACT EXTRACTION W/PHACO  10/28/2011   Procedure: CATARACT EXTRACTION  PHACO AND INTRAOCULAR LENS PLACEMENT (IOC);  Surgeon: Tonny Branch, MD;  Location: [facility];  Service: Ophthalmology;  Laterality: Right;  CDE 18.38  . CATARACT EXTRACTION W/PHACO  02/14/2012   Procedure: CATARACT EXTRACTION PHACO AND INTRAOCULAR LENS PLACEMENT (IOC);  Surgeon: Tonny Branch, MD;  Location: [facility];  Service: Ophthalmology;  Laterality: Left;  CDE 17.20  . CESAREAN SECTION    . COLONOSCOPY N/A 05/31/2013   Procedure: COLONOSCOPY;  Surgeon: Rogene Houston, MD;  [name];  Service: Endoscopy;  Laterality: N/A;  240  . COLONOSCOPY N/A 04/24/2018   Procedure: COLONOSCOPY;  Surgeon: Rogene Houston, MD;  [name];  Service: Endoscopy;  Laterality: N/A;  9:30  . ESOPHAGOGASTRODUODENOSCOPY N/A 04/24/2018   Procedure: ESOPHAGOGASTRODUODENOSCOPY (EGD);  Surgeon: Rogene Houston, MD;  [name];  Service: Endoscopy;  Laterality: N/A;  . EYE SURGERY     skin removal   . FRACTURE SURGERY  1952   fractured right arm  . LEFT HEART CATH AND CORONARY ANGIOGRAPHY N/A 09/25/2018   Procedure: LEFT HEART CATH AND CORONARY ANGIOGRAPHY;  Surgeon: Jettie Booze, MD;  Location: [place] [facility];  Service: Cardiovascular;  Laterality: N/A;    Current Outpatient Medications  Medication Sig Dispense Refill  . acetaminophen (TYLENOL) 500 MG tablet Take 500 mg by mouth daily  as needed for moderate pain or headache.    . Cholecalciferol (VITAMIN D3) 2000 UNITS TABS Take 2,000 Units by mouth daily.     [name] COLCRYS 0.6 MG tablet TAKE 1 TABLET DAILY AS NEEDED FOR GOUT FLARE UP 30 tablet 0  . furosemide (LASIX) 40 MG tablet Take 40 mg by mouth as needed.    . gabapentin (NEURONTIN) 100 MG capsule Take 1 capsule (100 mg total) by mouth 3 (three) times daily. 90 capsule 3  . hydrocortisone (ANUSOL-HC) 2.5 % rectal cream APPLY RECTALLY DAILY AS NEEDED FOR HEMMORHOIDS OR ITCHING. 30 g 0  . levothyroxine (SYNTHROID) 75 MCG tablet Take 1 tablet (75 mcg total) by mouth daily.  90 tablet 3  . metoprolol succinate (TOPROL-XL) 50 MG 24 hr tablet TAKE 1 TABLET DAILY WITH OR IMMEDIATELY FOLLOWING A MEAL. 90 tablet 3  . nitroGLYCERIN (NITROSTAT) 0.4 MG SL tablet Place 1 tablet (0.4 mg total) under the tongue every 5 (five) minutes x 3 doses as needed for chest pain. 30 tablet 0  . pantoprazole (PROTONIX) 40 MG tablet TAKE 1 TABLET BY MOUTH DAILY BEFORE BREAKFAST. 30 tablet 5  . rivaroxaban (XARELTO) 20 MG TABS tablet Take 1 tablet (20 mg total) by mouth daily with supper. 30 tablet 5   Current Facility-Administered Medications  Medication Dose Route Frequency Provider Last Rate Last Admin  . cyanocobalamin ((VITAMIN B-12)) injection 1,000 mcg  1,000 mcg Intramuscular Q30 days Dettinger, Fransisca Kaufmann, MD   1,000 mcg at 09/27/19 1542   Allergies:  Daypro [oxaprozin], Diclofenac sodium, Effexor [venlafaxine hydrochloride], Prozac [fluoxetine hcl], Amitriptyline, Cymbalta [duloxetine hcl], Lipitor [atorvastatin calcium], Penicillins, Savella [milnacipran hcl], Sulfa antibiotics, and Zocor [simvastatin]   ROS:   No syncope.  Physical Exam: VS:  BP (!) 156/78   Pulse 65   Ht 5\' 6"  (1.676 m)   Wt 211 lb (95.7 kg)   SpO2 98%   BMI 34.06 kg/m , BMI Body mass index is 34.06 kg/m.  Wt Readings from Last 3 Encounters:  10/24/19 211 lb (95.7 kg)  08/20/19 212 lb (96.2 kg)  03/20/19 210 lb 9.6 oz (95.5 kg)    General: Patient appears comfortable at rest. HEENT: Conjunctiva and lids normal, wearing a mask. Neck: Supple, no elevated JVP or carotid bruits, no thyromegaly. Lungs: Clear to auscultation, nonlabored breathing at rest. Cardiac: Regular rate and rhythm, no S3 or significant systolic murmur, no pericardial rub. Extremities: Lower leg edema/lymphedema.  ECG:  An ECG dated 09/23/2018 was personally reviewed today and demonstrated:  Sinus rhythm with nonspecific T wave changes.  Recent Labwork: 08/20/2019: ALT 16; AST 24; BUN 12; Creatinine, Ser 0.68; Hemoglobin 10.5;  Platelets 223; Potassium 4.7; Sodium 139; TSH 3.250     Component Value Date/Time   CHOL 190 08/20/2019 1151   TRIG 147 08/20/2019 1151   TRIG 161 (H) 10/14/2016 1010   HDL 53 08/20/2019 1151   HDL 54 10/14/2016 1010   CHOLHDL 3.6 08/20/2019 1151   CHOLHDL 3.6 10/16/2008 0540   VLDL 23 10/16/2008 0540   LDLCALC 111 (H) 08/20/2019 1151   LDLCALC 138 (H) 02/26/2014 1001    Other Studies Reviewed Today:  Echocardiogram 03/29/2018: Study Conclusions  - Left ventricle: The cavity size was normal. Wall thickness was increased in a pattern of mild LVH. Systolic function was vigorous. The estimated ejection fraction was in the range of 65% to 70%. Wall motion was normal; there were no regional wall motion abnormalities. Left ventricular diastolic function parameters were normal. - Aortic  valve: There was mild regurgitation. Valve area (VTI): 2.92 cm^2. Valve area (Vmax): 2.78 cm^2. Valve area (Vmean): 2.95 cm^2.  Cardiac catheterization 09/25/2018:  Prox LAD lesion is 20% stenosed.  Mid RCA lesion is 10% stenosed.  The left ventricular systolic function is normal.  LV end diastolic pressure is normal.  The left ventricular ejection fraction is 55-65% by visual estimate.  There is no aortic valve stenosis.  Nonobstructive CAD.   Assessment and Plan:  1.  Paroxysmal atrial fibrillation/flutter with CHA2DS2-VASc score of 3.  She continues on Xarelto for stroke prophylaxis and Toprol-XL.  She is in sinus rhythm today by ECG and does not report any progressive palpitations.  2.  Lower leg edema/lymphedema.  We have discussed use of compression stockings, continue Lasix.  Medication Adjustments/Labs and Tests Ordered: Current medicines are reviewed at length with the patient today.  Concerns regarding medicines are outlined above.   Tests Ordered: Orders Placed This Encounter  Procedures  . EKG 12-Lead    Medication Changes: No orders of the defined types  were placed in this encounter.   Disposition:  Follow up 6 months in the [place] office.  Signed, Satira Sark, MD, [facility] 10/24/2019 2:10 PM    [place] at [place], [place], [place] [address] Phone: [phone]; Fax: [phone]

## 2019-10-29 ENCOUNTER — Other Ambulatory Visit: Payer: Self-pay

## 2019-10-29 ENCOUNTER — Ambulatory Visit (INDEPENDENT_AMBULATORY_CARE_PROVIDER_SITE_OTHER): Payer: Medicare Other

## 2019-10-29 DIAGNOSIS — E538 Deficiency of other specified B group vitamins: Secondary | ICD-10-CM | POA: Diagnosis not present

## 2019-10-29 NOTE — Progress Notes (Signed)
B12 injection given today. Pt tolerated well. She has no concerns.

## 2019-11-02 ENCOUNTER — Other Ambulatory Visit (HOSPITAL_COMMUNITY): Payer: Self-pay | Admitting: *Deleted

## 2019-11-02 DIAGNOSIS — D500 Iron deficiency anemia secondary to blood loss (chronic): Secondary | ICD-10-CM

## 2019-11-02 DIAGNOSIS — D508 Other iron deficiency anemias: Secondary | ICD-10-CM

## 2019-11-05 ENCOUNTER — Inpatient Hospital Stay (HOSPITAL_COMMUNITY): Payer: Medicare Other | Attending: Hematology

## 2019-11-05 ENCOUNTER — Other Ambulatory Visit: Payer: Self-pay

## 2019-11-05 DIAGNOSIS — D519 Vitamin B12 deficiency anemia, unspecified: Secondary | ICD-10-CM | POA: Insufficient documentation

## 2019-11-05 DIAGNOSIS — E039 Hypothyroidism, unspecified: Secondary | ICD-10-CM | POA: Diagnosis not present

## 2019-11-05 DIAGNOSIS — D508 Other iron deficiency anemias: Secondary | ICD-10-CM

## 2019-11-05 DIAGNOSIS — E669 Obesity, unspecified: Secondary | ICD-10-CM | POA: Diagnosis not present

## 2019-11-05 DIAGNOSIS — D500 Iron deficiency anemia secondary to blood loss (chronic): Secondary | ICD-10-CM

## 2019-11-05 DIAGNOSIS — D509 Iron deficiency anemia, unspecified: Secondary | ICD-10-CM | POA: Diagnosis not present

## 2019-11-05 LAB — CBC WITH DIFFERENTIAL/PLATELET
Abs Immature Granulocytes: 0.04 10*3/uL (ref 0.00–0.07)
Basophils Absolute: 0 10*3/uL (ref 0.0–0.1)
Basophils Relative: 0 %
Eosinophils Absolute: 0.2 10*3/uL (ref 0.0–0.5)
Eosinophils Relative: 2 %
HCT: 31.3 % — ABNORMAL LOW (ref 36.0–46.0)
Hemoglobin: 9.3 g/dL — ABNORMAL LOW (ref 12.0–15.0)
Immature Granulocytes: 1 %
Lymphocytes Relative: 18 %
Lymphs Abs: 1.3 10*3/uL (ref 0.7–4.0)
MCH: 24.2 pg — ABNORMAL LOW (ref 26.0–34.0)
MCHC: 29.7 g/dL — ABNORMAL LOW (ref 30.0–36.0)
MCV: 81.5 fL (ref 80.0–100.0)
Monocytes Absolute: 0.7 10*3/uL (ref 0.1–1.0)
Monocytes Relative: 10 %
Neutro Abs: 5 10*3/uL (ref 1.7–7.7)
Neutrophils Relative %: 69 %
Platelets: 241 10*3/uL (ref 150–400)
RBC: 3.84 MIL/uL — ABNORMAL LOW (ref 3.87–5.11)
RDW: 13.7 % (ref 11.5–15.5)
WBC: 7.3 10*3/uL (ref 4.0–10.5)
nRBC: 0 % (ref 0.0–0.2)

## 2019-11-05 LAB — COMPREHENSIVE METABOLIC PANEL
ALT: 12 U/L (ref 0–44)
AST: 14 U/L — ABNORMAL LOW (ref 15–41)
Albumin: 3.9 g/dL (ref 3.5–5.0)
Alkaline Phosphatase: 84 U/L (ref 38–126)
Anion gap: 7 (ref 5–15)
BUN: 10 mg/dL (ref 8–23)
CO2: 27 mmol/L (ref 22–32)
Calcium: 8.4 mg/dL — ABNORMAL LOW (ref 8.9–10.3)
Chloride: 98 mmol/L (ref 98–111)
Creatinine, Ser: 0.78 mg/dL (ref 0.44–1.00)
GFR calc Af Amer: >60 mL/min (ref >60)
GFR calc non Af Amer: >60 mL/min (ref >60)
Glucose, Bld: 114 mg/dL — ABNORMAL HIGH (ref 70–99)
Potassium: 4.2 mmol/L (ref 3.5–5.1)
Sodium: 132 mmol/L — ABNORMAL LOW (ref 135–145)
Total Bilirubin: 0.6 mg/dL (ref 0.3–1.2)
Total Protein: 6.1 g/dL — ABNORMAL LOW (ref 6.5–8.1)

## 2019-11-05 LAB — IRON AND TIBC
Iron: 21 ug/dL — ABNORMAL LOW (ref 28–170)
Saturation Ratios: 5 % — ABNORMAL LOW (ref 10.4–31.8)
TIBC: 398 ug/dL (ref 250–450)
UIBC: 377 ug/dL

## 2019-11-05 LAB — FERRITIN: Ferritin: 7 ng/mL — ABNORMAL LOW (ref 11–307)

## 2019-11-05 LAB — FOLATE: Folate: 14.2 ng/mL (ref >5.9)

## 2019-11-05 LAB — VITAMIN B12: Vitamin B-12: 498 pg/mL (ref 180–914)

## 2019-11-12 ENCOUNTER — Ambulatory Visit (HOSPITAL_COMMUNITY): Payer: Medicare Other | Admitting: Hematology

## 2019-11-13 ENCOUNTER — Other Ambulatory Visit: Payer: Self-pay

## 2019-11-13 ENCOUNTER — Inpatient Hospital Stay (HOSPITAL_BASED_OUTPATIENT_CLINIC_OR_DEPARTMENT_OTHER): Payer: Medicare Other | Admitting: Oncology

## 2019-11-13 VITALS — BP 152/46 | HR 75 | Temp 98.5°F | Resp 18 | Wt 211.3 lb

## 2019-11-13 DIAGNOSIS — D519 Vitamin B12 deficiency anemia, unspecified: Secondary | ICD-10-CM | POA: Diagnosis not present

## 2019-11-13 DIAGNOSIS — E669 Obesity, unspecified: Secondary | ICD-10-CM

## 2019-11-13 DIAGNOSIS — E039 Hypothyroidism, unspecified: Secondary | ICD-10-CM

## 2019-11-13 DIAGNOSIS — D500 Iron deficiency anemia secondary to blood loss (chronic): Secondary | ICD-10-CM

## 2019-11-13 DIAGNOSIS — D509 Iron deficiency anemia, unspecified: Secondary | ICD-10-CM | POA: Diagnosis not present

## 2019-11-13 NOTE — Progress Notes (Signed)
t       Dettinger, Fransisca Kaufmann, MD [place] [address]  Iron deficiency anemia due to chronic blood loss  Acquired hypothyroidism  Anemia due to vitamin B12 deficiency, unspecified B12 deficiency type  Obesity (BMI 30.0-34.9)   HISTORY OF PRESENT ILLNESS: Iron deficiency anemia due to chronic blood loss - Initial evaluation: 03/15/2018 that were reviewed and showed WBC 5.6 HB 8.9 plts 267,000. Ferritin: 12.  -Treated with IV iron on 04/12/2018 and 04/19/2018 with Feraheme.  -Pt had EGD and colonoscopy done on 04/24/2018: negative for malignancy.   CURRENT STATUS: Jenna [name] 84 y.o. female returns for followup of in follow-up of iron deficiency anemia having required IV iron therapy in 03/2018 and now more recently with a progressive microcytic anemia that is hypochromic concerning for iron deficiency.  Iron studies confirm iron deficiency last week.  She reports feeling more fatigued and tired.  She also reports intermittent constipation.  She denies blood in her stools or black stools.  No gross hematuria or hemoptysis.  Appetite is good and weight is stable.  She denies pica and pagophagia.  She continues to suffer from post-herpetic neuropathy and is on gabapentin therapy managed by her primary care provider.  Review of Systems  Constitutional: Positive for malaise/fatigue. Negative for chills, fever and weight loss.  HENT: Negative.   Eyes: Negative.   Respiratory: Negative.  Negative for cough.   Cardiovascular: Negative.  Negative for chest pain.  Gastrointestinal: Negative.  Negative for blood in stool, constipation, diarrhea, melena, nausea and vomiting.  Genitourinary: Negative.   Musculoskeletal: Negative.   Skin: Negative.   Neurological: Positive for tingling and weakness. Negative for focal weakness.  Endo/Heme/Allergies: Negative.   Psychiatric/Behavioral: Negative.     Past Medical History:  Diagnosis Date  . Cataract   . DDD  (degenerative disc disease)   . Fibromyalgia   . Herpes zoster   . History of skin cancer   . Hyperlipidemia   . Hypertension   . Hypothyroidism   . Iron deficiency anemia due to chronic blood loss 04/06/2018  . OA (osteoarthritis)   . OSA on CPAP 01/16/2018   Mild OSA overall with an AHI of 5.8/h but during REM sleep moderate with an AHI of 28/h.  Oxygen saturations dropped to 76% during respiratory events.  Now on CPAP at 9 cm H2O   . Osteoporosis   . Palpitations   . Pre-diabetes   . PUD (peptic ulcer disease)   . Skin cancer of nose    [name]    Past Surgical History:  Procedure Laterality Date  . ABDOMINAL HYSTERECTOMY     partial  . BIOPSY  04/24/2018   Procedure: BIOPSY;  Surgeon: Rogene Houston, MD;  [facility];  Service: Endoscopy;;  gastric body ulcerated surface  . BREAST BIOPSY     Right  . CATARACT EXTRACTION W/PHACO  10/28/2011   Procedure: CATARACT EXTRACTION PHACO AND INTRAOCULAR LENS PLACEMENT (IOC);  Surgeon: Tonny Branch, MD;  Location: [facility];  Service: Ophthalmology;  Laterality: Right;  CDE 18.38  . CATARACT EXTRACTION W/PHACO  02/14/2012   Procedure: CATARACT EXTRACTION PHACO AND INTRAOCULAR LENS PLACEMENT (IOC);  Surgeon: Tonny Branch, MD;  Location: [facility];  Service: Ophthalmology;  Laterality: Left;  CDE 17.20  . CESAREAN SECTION    . COLONOSCOPY N/A 05/31/2013   Procedure: COLONOSCOPY;  Surgeon: Rogene Houston, MD;  [facility];  Service: Endoscopy;  Laterality: N/A;  240  .  COLONOSCOPY N/A 04/24/2018   Procedure: COLONOSCOPY;  Surgeon: Rogene Houston, MD;  [facility];  Service: Endoscopy;  Laterality: N/A;  9:30  . ESOPHAGOGASTRODUODENOSCOPY N/A 04/24/2018   Procedure: ESOPHAGOGASTRODUODENOSCOPY (EGD);  Surgeon: Rogene Houston, MD;  [facility];  Service: Endoscopy;  Laterality: N/A;  . EYE SURGERY     skin removal   . FRACTURE SURGERY  1952   fractured right arm  . LEFT HEART CATH AND CORONARY  ANGIOGRAPHY N/A 09/25/2018   Procedure: LEFT HEART CATH AND CORONARY ANGIOGRAPHY;  Surgeon: Jettie Booze, MD;  Location: [place] [facility];  Service: Cardiovascular;  Laterality: N/A;    Family History  Problem Relation Age of Onset  . CVA Mother   . Stroke Mother   . Osteoporosis Mother   . Cancer Father        prostate  . Bronchitis Sister   . COPD Sister   . Early death Brother        died in [place]  . Carpal tunnel syndrome Sister   . Cancer Brother        throat  . Cancer Brother        lung  . Cancer Brother        throat  . Stroke Brother   . Heart attack Brother   . Gout Brother   . Heart Problems Brother        stents  . CAD Brother   . Hypertension Brother   . Gout Brother   . Arthritis Brother   . Diabetes Son   . Coronary artery disease Neg Hx        No premature  . Anesthesia problems Neg Hx   . Hypotension Neg Hx   . Malignant hyperthermia Neg Hx   . Pseudochol deficiency Neg Hx     Social History   Socioeconomic History  . Marital status: Married    Spouse name: [name]  . Number of children: 1  . Years of education: 45  . Highest education level: 9th grade  Occupational History  . Occupation: Retired    Comment: Therapist, art  Tobacco Use  . Smoking status: Never Smoker  . Smokeless tobacco: Never Used  Vaping Use  . Vaping Use: Never used  Substance and Sexual Activity  . Alcohol use: No  . Drug use: No  . Sexual activity: Not Currently    Birth control/protection: Surgical  Other Topics Concern  . Not on file  Social History Narrative   Married   Lives in a one story home    Social Determinants of Health   Financial Resource Strain:   . Difficulty of Paying Living Expenses:   Food Insecurity:   . Worried About Charity fundraiser in the Last Year:   . Arboriculturist in the Last Year:   Transportation Needs:   . Film/video editor (Medical):   [name] Lack of Transportation (Non-Medical):   Physical  Activity:   . Days of Exercise per Week:   . Minutes of Exercise per Session:   Stress:   . Feeling of Stress :   Social Connections:   . Frequency of Communication with Friends and Family:   . Frequency of Social Gatherings with Friends and Family:   . Attends Religious Services:   . Active Member of Clubs or Organizations:   . Attends Archivist Meetings:   [name] Marital Status:  PHYSICAL EXAMINATION  ECOG PERFORMANCE STATUS: 1 - Symptomatic but completely ambulatory  Vitals:   11/13/19 1355  BP: (!) 152/46  Pulse: 75  Resp: 18  Temp: 98.5 F (36.9 C)  SpO2: 100%    GENERAL:alert, no distress, well nourished, well developed, comfortable, cooperative, smiling and unaccompanied SKIN: skin color, texture, turgor are normal, no rashes or significant lesions HEAD: Normocephalic, No masses, lesions, tenderness or abnormalities EYES: normal, Conjunctiva are pink and non-injected EARS: External ears normal OROPHARYNX: Not examined, mask in place NECK: supple LYMPH:  no palpable lymphadenopathy BREAST:not examined LUNGS: clear to auscultation and percussion HEART: regular rate & rhythm ABDOMEN:abdomen soft and normal bowel sounds BACK: Back symmetric, no curvature. EXTREMITIES:less then 2 second capillary refill  NEURO: alert & oriented x 3 with fluent speech   LABORATORY DATA: CBC    Component Value Date/Time   WBC 7.3 11/05/2019 1300   RBC 3.84 (L) 11/05/2019 1300   HGB 9.3 (L) 11/05/2019 1300   HGB 10.5 (L) 08/20/2019 1151   HCT 31.3 (L) 11/05/2019 1300   HCT 32.4 (L) 08/20/2019 1151   PLT 241 11/05/2019 1300   PLT 223 08/20/2019 1151   MCV 81.5 11/05/2019 1300   MCV 84 08/20/2019 1151   MCH 24.2 (L) 11/05/2019 1300   MCHC 29.7 (L) 11/05/2019 1300   RDW 13.7 11/05/2019 1300   RDW 12.8 08/20/2019 1151   LYMPHSABS 1.3 11/05/2019 1300   LYMPHSABS 1.3 08/20/2019 1151   MONOABS 0.7 11/05/2019 1300   EOSABS 0.2 11/05/2019 1300   EOSABS 0.1 08/20/2019  1151   BASOSABS 0.0 11/05/2019 1300   BASOSABS 0.0 08/20/2019 1151      Chemistry      Component Value Date/Time   NA 132 (L) 11/05/2019 1300   NA 139 08/20/2019 1151   K 4.2 11/05/2019 1300   CL 98 11/05/2019 1300   CO2 27 11/05/2019 1300   BUN 10 11/05/2019 1300   BUN 12 08/20/2019 1151   CREATININE 0.78 11/05/2019 1300      Component Value Date/Time   CALCIUM 8.4 (L) 11/05/2019 1300   ALKPHOS 84 11/05/2019 1300   AST 14 (L) 11/05/2019 1300   ALT 12 11/05/2019 1300   BILITOT 0.6 11/05/2019 1300   BILITOT 0.5 08/20/2019 1151       RADIOGRAPHIC STUDIES:  No results found.   PATHOLOGY:    ASSESSMENT AND PLAN:  1. Iron deficiency anemia due to chronic blood loss - Initial evaluation: 03/15/2018 that were reviewed and showed WBC 5.6 HB 8.9 plts 267,000. Ferritin: 12.  -Treated with IV iron on 04/12/2018 and 04/19/2018 with Feraheme.  -Pt had EGD and colonoscopy done on 04/24/2018: negative for malignancy.  Laboratory work performed on 11/05/2019 reveals a progressive anemia of 9.3 g/dL with a low normal MCV of 81.5, low MCHC at 29.7, iron saturation 5% with an upper limits of normal TIBC of 398 and a low ferritin of 7.  These results demonstrated iron deficiency anemia and she will require IV iron replacement therapy for correction.  Calculated iron deficit is 965 mg of iron.  Labs in 6 weeks and 12 weeks: CBC diff, iron/TIBC, ferritin.  Return in 12 weeks for follow-up, sooner if needed.  Given this new development of her iron deficiency, I would anticipate that if all is well in 12 weeks, we could consider doing laboratory work every 12 weeks with follow-up visit every 6 months.  Do not feel comfortable at this time decreasing her laboratory and office visit  frequency to yearly.  If iron deficiency persists, she needs to be reevaluated by GI.  Previous documents reviewed from an iron deficiency standpoint.   2. Acquired hypothyroidism On levothyroxine  therapy  3. Anemia due to vitamin B12 deficiency, unspecified B12 deficiency type On IM B12 replacement therapy by primary care provider.  4. Obesity (BMI 30.0-34.9) BMI today is 34.1 kg/m.  Weight reduction interventions are recommended.   ORDERS PLACED FOR THIS ENCOUNTER: No orders of the defined types were placed in this encounter.   MEDICATIONS PRESCRIBED THIS ENCOUNTER: No orders of the defined types were placed in this encounter.   All questions were answered. The patient knows to call the clinic with any problems, questions or concerns. We can certainly see the patient much sooner if necessary.  Patient and plan discussed with Dr. Derek Jack and she is in agreement with the aforementioned.   This note is electronically signed by: Robynn Pane, PA-C 11/13/2019 2:12 PM

## 2019-11-13 NOTE — Patient Instructions (Signed)
[place] at [facility] Discharge Instructions  You were seen today by Kirby Crigler PA. He went over your recent lab results. Your iron level is low, he will schedule you for 2 weekly doses of IV Iron. He will repeat your labs in 6 weeks. He will see you back in 3 months for labs and follow up.   Thank you for choosing [place] at [facility] to provide your oncology and hematology care.  To afford each patient quality time with our provider, please arrive at least 15 minutes before your scheduled appointment time.   If you have a lab appointment with the [facility] please come in thru the  Main Entrance and check in at the main information desk  You need to re-schedule your appointment should you arrive 10 or more minutes late.  We strive to give you quality time with our providers, and arriving late affects you and other patients whose appointments are after yours.  Also, if you no show three or more times for appointments you may be dismissed from the clinic at the providers discretion.     Again, thank you for choosing [facility].  Our hope is that these requests will decrease the amount of time that you wait before being seen by our physicians.       _____________________________________________________________  Should you have questions after your visit to [facility], please contact our office at [phone] between the hours of 8:00 a.m. and 4:30 p.m.  Voicemails left after 4:00 p.m. will not be returned until the following business day.  For prescription refill requests, have your pharmacy contact our office and allow 72 hours.    Cancer Center Support Programs:   > Cancer Support Group  2nd Tuesday of the month 1pm-2pm, Journey Room

## 2019-11-15 ENCOUNTER — Other Ambulatory Visit: Payer: Self-pay

## 2019-11-15 ENCOUNTER — Inpatient Hospital Stay (HOSPITAL_COMMUNITY): Payer: Medicare Other

## 2019-11-15 ENCOUNTER — Encounter (HOSPITAL_COMMUNITY): Payer: Self-pay

## 2019-11-15 VITALS — BP 121/47 | HR 68 | Temp 97.7°F | Resp 18

## 2019-11-15 DIAGNOSIS — D509 Iron deficiency anemia, unspecified: Secondary | ICD-10-CM | POA: Diagnosis not present

## 2019-11-15 DIAGNOSIS — D500 Iron deficiency anemia secondary to blood loss (chronic): Secondary | ICD-10-CM

## 2019-11-15 MED ORDER — SODIUM CHLORIDE 0.9 % IV SOLN: Freq: Once | INTRAVENOUS | Status: AC

## 2019-11-15 MED ORDER — SODIUM CHLORIDE 0.9 % IV SOLN
510.0000 mg | Freq: Once | INTRAVENOUS | Status: AC
  Administered 2019-11-15: 510 mg via INTRAVENOUS
  Filled 2019-11-15: qty 510

## 2019-11-15 MED ORDER — ALTEPLASE 2 MG IJ SOLR: 2.0000 mg | Freq: Once | INTRAMUSCULAR | Status: DC | PRN

## 2019-11-15 MED ORDER — SODIUM CHLORIDE 0.9% FLUSH: 3.0000 mL | Freq: Once | INTRAVENOUS | Status: DC | PRN

## 2019-11-15 MED ORDER — HEPARIN SOD (PORK) LOCK FLUSH 100 UNIT/ML IV SOLN: 500.0000 [IU] | Freq: Once | INTRAVENOUS | Status: DC | PRN

## 2019-11-15 MED ORDER — HEPARIN SOD (PORK) LOCK FLUSH 100 UNIT/ML IV SOLN: 250.0000 [IU] | Freq: Once | INTRAVENOUS | Status: DC | PRN

## 2019-11-15 MED ORDER — SODIUM CHLORIDE 0.9% FLUSH: 10.0000 mL | Freq: Once | INTRAVENOUS | Status: DC | PRN

## 2019-11-15 NOTE — Progress Notes (Signed)
Patient tolerated iron infusion with no complaints voiced.  Peripheral IV site clean and dry with good blood return noted before and after infusion.  Band aid applied.  VSS with discharge and left ambulatory with no s/s of distress noted.  

## 2019-11-21 ENCOUNTER — Other Ambulatory Visit: Payer: Self-pay

## 2019-11-21 ENCOUNTER — Ambulatory Visit (INDEPENDENT_AMBULATORY_CARE_PROVIDER_SITE_OTHER): Payer: Medicare Other | Admitting: Family Medicine

## 2019-11-21 ENCOUNTER — Encounter: Payer: Self-pay | Admitting: Family Medicine

## 2019-11-21 VITALS — BP 140/60 | HR 67 | Temp 98.0°F | Ht 66.0 in | Wt 209.0 lb

## 2019-11-21 DIAGNOSIS — E782 Mixed hyperlipidemia: Secondary | ICD-10-CM

## 2019-11-21 DIAGNOSIS — I10 Essential (primary) hypertension: Secondary | ICD-10-CM

## 2019-11-21 DIAGNOSIS — E039 Hypothyroidism, unspecified: Secondary | ICD-10-CM

## 2019-11-21 DIAGNOSIS — K219 Gastro-esophageal reflux disease without esophagitis: Secondary | ICD-10-CM | POA: Diagnosis not present

## 2019-11-21 DIAGNOSIS — Z13820 Encounter for screening for osteoporosis: Secondary | ICD-10-CM

## 2019-11-21 DIAGNOSIS — R7303 Prediabetes: Secondary | ICD-10-CM

## 2019-11-21 LAB — BAYER DCA HB A1C WAIVED: HB A1C (BAYER DCA - WAIVED): 6.8 % (ref <7.0)

## 2019-11-21 MED ORDER — METOPROLOL SUCCINATE ER 50 MG PO TB24: ORAL_TABLET | ORAL | 3 refills | Status: DC

## 2019-11-21 MED ORDER — LEVOTHYROXINE SODIUM 75 MCG PO TABS: 75.0000 ug | ORAL_TABLET | Freq: Every day | ORAL | 3 refills | Status: DC

## 2019-11-21 MED ORDER — PANTOPRAZOLE SODIUM 40 MG PO TBEC: DELAYED_RELEASE_TABLET | ORAL | 3 refills | Status: DC

## 2019-11-21 NOTE — Progress Notes (Signed)
BP 140/60   Pulse 67   Temp 98 F (36.7 C)   Ht 5\' 6"  (1.676 m)   Wt 209 lb (94.8 kg)   SpO2 96%   BMI 33.73 kg/m    Subjective:   Patient ID: Jenna [name], female    DOB: 07/12/1935, 84 y.o.   MRN: [id number]  HPI: Jenna [name] is a 84 y.o. female presenting on 11/21/2019 for Medical Management of Chronic Issues, Hypothyroidism, Hypertension, and Fatigue   HPI Hypertension Patient is currently on metoprolol and the occasional furosemide, and their blood pressure today is 140/60. Patient denies any lightheadedness or dizziness. Patient denies headaches, blurred vision, chest pains, shortness of breath, or weakness. Denies any side effects from medication and is content with current medication.   Prediabetes Patient comes in today for recheck of his diabetes. Patient has been currently taking the medication currently and has been creeping up recently, last A1c was 6.9. Patient is not currently on an ACE inhibitor/ARB. Patient has not seen an ophthalmologist this year. Patient denies any issues with their feet. The symptom started onset as an adult hyperlipidemia and hypertension and hypothyroidism ARE RELATED TO DM   Hyperlipidemia Patient is coming in for recheck of his hyperlipidemia. The patient is currently taking no medication currently, has been intolerant of statins. They deny any issues with myalgias or history of liver damage from it. They deny any focal numbness or weakness or chest pain.   Hypothyroidism recheck Patient is coming in for thyroid recheck today as well. They deny any issues with hair changes or heat or cold problems or diarrhea or constipation. They deny any chest pain or palpitations. They are currently on levothyroxine 75 micrograms   Patient has been followed for the anemia with hematology and has fatigue from this but it is improving with iron infusions.  Relevant past medical, surgical, family and social history reviewed and updated as indicated.  Interim medical history since our last visit reviewed. Allergies and medications reviewed and updated.  Review of Systems  Constitutional: Positive for fatigue. Negative for chills and fever.  HENT: Negative for ear discharge.   Eyes: Negative for visual disturbance.  Respiratory: Negative for chest tightness and shortness of breath.   Cardiovascular: Negative for chest pain and leg swelling.  Musculoskeletal: Negative for back pain and gait problem.  Skin: Negative for rash.  Neurological: Negative for light-headedness and headaches.  Psychiatric/Behavioral: Negative for agitation and behavioral problems.  All other systems reviewed and are negative.   Per HPI unless specifically indicated above   Allergies as of 11/21/2019      Reactions   Daypro [oxaprozin]    Unknown reaction   Diclofenac Sodium    Unknown reaction   Effexor [venlafaxine Hydrochloride]    Unknown reaction   Prozac [fluoxetine Hcl]    Unknown reaction   Amitriptyline Rash   Cymbalta [duloxetine Hcl] Nausea Only   Lipitor [atorvastatin Calcium] Other (See Comments)   Leg aches   Penicillins Rash   Has patient had a PCN reaction causing immediate rash, facial/tongue/throat swelling, SOB or lightheadedness with hypotension: Yes Has patient had a PCN reaction causing severe rash involving mucus membranes or skin necrosis: No Has patient had a PCN reaction that required hospitalization: No Has patient had a PCN reaction occurring within the last 10 years: No If all of the above answers are "NO", then may proceed with Cephalosporin use.   Savella [milnacipran Hcl] Rash   Sulfa Antibiotics Rash  Zocor [simvastatin] Other (See Comments)   Hip pain      Medication List       Accurate as of November 21, 2019  1:33 PM. If you have any questions, ask your nurse or doctor.        acetaminophen 500 MG tablet Commonly known as: TYLENOL Take 500 mg by mouth daily as needed for moderate pain or headache.     Colcrys 0.6 MG tablet Generic drug: colchicine TAKE 1 TABLET DAILY AS NEEDED FOR GOUT FLARE UP   furosemide 40 MG tablet Commonly known as: LASIX Take 40 mg by mouth as needed.   gabapentin 100 MG capsule Commonly known as: NEURONTIN Take 1 capsule (100 mg total) by mouth 3 (three) times daily.   hydrocortisone 2.5 % rectal cream Commonly known as: ANUSOL-HC APPLY RECTALLY DAILY AS NEEDED FOR HEMMORHOIDS OR ITCHING.   levothyroxine 75 MCG tablet Commonly known as: SYNTHROID Take 1 tablet (75 mcg total) by mouth daily.   metoprolol succinate 50 MG 24 hr tablet Commonly known as: TOPROL-XL TAKE 1 TABLET DAILY WITH OR IMMEDIATELY FOLLOWING A MEAL.   nitroGLYCERIN 0.4 MG SL tablet Commonly known as: NITROSTAT Place 1 tablet (0.4 mg total) under the tongue every 5 (five) minutes x 3 doses as needed for chest pain.   pantoprazole 40 MG tablet Commonly known as: PROTONIX TAKE 1 TABLET BY MOUTH DAILY BEFORE BREAKFAST.   rivaroxaban 20 MG Tabs tablet Commonly known as: Xarelto Take 1 tablet (20 mg total) by mouth daily with supper.   Vitamin D3 50 MCG (2000 UT) Tabs Take 2,000 Units by mouth daily.        Objective:   BP 140/60   Pulse 67   Temp 98 F (36.7 C)   Ht 5\' 6"  (1.676 m)   Wt 209 lb (94.8 kg)   SpO2 96%   BMI 33.73 kg/m   Wt Readings from Last 3 Encounters:  11/21/19 209 lb (94.8 kg)  11/13/19 211 lb 4.8 oz (95.8 kg)  10/24/19 211 lb (95.7 kg)    Physical Exam Vitals and nursing note reviewed.  Constitutional:      General: She is not in acute distress.    Appearance: She is well-developed. She is not diaphoretic.  Eyes:     Conjunctiva/sclera: Conjunctivae normal.  Cardiovascular:     Rate and Rhythm: Normal rate and regular rhythm.     Heart sounds: Normal heart sounds. No murmur heard.   Pulmonary:     Effort: Pulmonary effort is normal. No respiratory distress.     Breath sounds: Normal breath sounds. No wheezing.  Musculoskeletal:         General: No tenderness. Normal range of motion.  Skin:    General: Skin is warm and dry.     Findings: No rash.  Neurological:     Mental Status: She is alert and oriented to person, place, and time.     Coordination: Coordination normal.  Psychiatric:        Behavior: Behavior normal.     Results for orders placed or performed in visit on 11/05/19  CBC with Differential  Result Value Ref Range   WBC 7.3 4.0 - 10.5 K/uL   RBC 3.84 (L) 3.87 - 5.11 MIL/uL   Hemoglobin 9.3 (L) 12.0 - 15.0 g/dL   HCT 31.3 (L) 36 - 46 %   MCV 81.5 80.0 - 100.0 fL   MCH 24.2 (L) 26.0 - 34.0 pg   MCHC 29.7 (L)  30.0 - 36.0 g/dL   RDW 13.7 11.5 - 15.5 %   Platelets 241 150 - 400 K/uL   nRBC 0.0 0.0 - 0.2 %   Neutrophils Relative % 69 %   Neutro Abs 5.0 1.7 - 7.7 K/uL   Lymphocytes Relative 18 %   Lymphs Abs 1.3 0.7 - 4.0 K/uL   Monocytes Relative 10 %   Monocytes Absolute 0.7 0 - 1 K/uL   Eosinophils Relative 2 %   Eosinophils Absolute 0.2 0 - 0 K/uL   Basophils Relative 0 %   Basophils Absolute 0.0 0 - 0 K/uL   Immature Granulocytes 1 %   Abs Immature Granulocytes 0.04 0.00 - 0.07 K/uL  Comprehensive metabolic panel  Result Value Ref Range   Sodium 132 (L) 135 - 145 mmol/L   Potassium 4.2 3.5 - 5.1 mmol/L   Chloride 98 98 - 111 mmol/L   CO2 27 22 - 32 mmol/L   Glucose, Bld 114 (H) 70 - 99 mg/dL   BUN 10 8 - 23 mg/dL   Creatinine, Ser 0.78 0.44 - 1.00 mg/dL   Calcium 8.4 (L) 8.9 - 10.3 mg/dL   Total Protein 6.1 (L) 6.5 - 8.1 g/dL   Albumin 3.9 3.5 - 5.0 g/dL   AST 14 (L) 15 - 41 U/L   ALT 12 0 - 44 U/L   Alkaline Phosphatase 84 38 - 126 U/L   Total Bilirubin 0.6 0.3 - 1.2 mg/dL   GFR calc non Af Amer >60 >60 mL/min   GFR calc Af Amer >60 >60 mL/min   Anion gap 7 5 - 15  Iron and TIBC  Result Value Ref Range   Iron 21 (L) 28 - 170 ug/dL   TIBC 398 250 - 450 ug/dL   Saturation Ratios 5 (L) 10.4 - 31.8 %   UIBC 377 ug/dL  Ferritin  Result Value Ref Range   Ferritin 7 (L) 11 - 307  ng/mL  Vitamin B12  Result Value Ref Range   Vitamin B-12 498 180 - 914 pg/mL  Folate  Result Value Ref Range   Folate 14.2 >5.9 ng/mL    Assessment & Plan:   Problem List Items Addressed This Visit      Cardiovascular and Mediastinum   Hypertension   Relevant Medications   metoprolol succinate (TOPROL-XL) 50 MG 24 hr tablet     Endocrine   Hypothyroidism - Primary   Relevant Medications   levothyroxine (SYNTHROID) 75 MCG tablet   metoprolol succinate (TOPROL-XL) 50 MG 24 hr tablet   Other Relevant Orders   TSH     Other   Hyperlipidemia   Relevant Medications   metoprolol succinate (TOPROL-XL) 50 MG 24 hr tablet   Pre-diabetes   Relevant Orders   Bayer DCA Hb A1c Waived    Other Visit Diagnoses    Gastroesophageal reflux disease       Relevant Medications   pantoprazole (PROTONIX) 40 MG tablet   Osteoporosis screening       Relevant Orders   DG [facility] DEXA      Continue current medication, no changes.  We will check blood work today. Follow up plan: Return in about 3 months (around 02/21/2020), or if symptoms worsen or fail to improve, for Prediabetes and hypothyroidism and hypertension.  Counseling provided for all of the vaccine components Orders Placed This Encounter  Procedures  . DG [facility] DEXA  . TSH  . [facility] Hb A1c Terrilyn Saver Koden Hunzeker,  MD [facility] 11/21/2019, 1:33 PM

## 2019-11-22 ENCOUNTER — Encounter (HOSPITAL_COMMUNITY): Payer: Self-pay

## 2019-11-22 ENCOUNTER — Inpatient Hospital Stay (HOSPITAL_COMMUNITY): Payer: Medicare Other | Attending: Nurse Practitioner

## 2019-11-22 VITALS — BP 136/73 | HR 61 | Temp 97.7°F | Resp 18

## 2019-11-22 DIAGNOSIS — D500 Iron deficiency anemia secondary to blood loss (chronic): Secondary | ICD-10-CM

## 2019-11-22 DIAGNOSIS — D509 Iron deficiency anemia, unspecified: Secondary | ICD-10-CM | POA: Insufficient documentation

## 2019-11-22 LAB — TSH: TSH: 2.47 u[IU]/mL (ref 0.450–4.500)

## 2019-11-22 MED ORDER — SODIUM CHLORIDE 0.9 % IV SOLN: Freq: Once | INTRAVENOUS | Status: AC

## 2019-11-22 MED ORDER — SODIUM CHLORIDE 0.9 % IV SOLN
510.0000 mg | Freq: Once | INTRAVENOUS | Status: AC
  Administered 2019-11-22: 510 mg via INTRAVENOUS
  Filled 2019-11-22: qty 510

## 2019-11-22 NOTE — Progress Notes (Signed)
Patient presents today for Feraheme infusion. MAR reviewed. Patient has no complaints of any changes since her last visit. Vital signs stable.   Treatment given today per MD orders. Tolerated infusion without adverse affects. Vital signs stable. No complaints at this time. Discharged from clinic ambulatory. F/U with [facility] as scheduled.

## 2019-11-22 NOTE — Patient Instructions (Signed)
[facility] at [facility]  Discharge Instructions:   _______________________________________________________________  Thank you for choosing [facility] at [facility] to provide your oncology and hematology care.  To afford each patient quality time with our providers, please arrive at least 15 minutes before your scheduled appointment.  You need to re-schedule your appointment if you arrive 10 or more minutes late.  We strive to give you quality time with our providers, and arriving late affects you and other patients whose appointments are after yours.  Also, if you no show three or more times for appointments you may be dismissed from the clinic.  Again, thank you for choosing [facility] at [facility]. Our hope is that these requests will allow you access to exceptional care and in a timely manner. _______________________________________________________________  If you have questions after your visit, please contact our office at [phone] between the hours of 8:30 a.m. and 5:00 p.m. Voicemails left after 4:30 p.m. will not be returned until the following business day. _______________________________________________________________  For prescription refill requests, have your pharmacy contact our office. _______________________________________________________________  Recommendations made by the consultant and any test results will be sent to your referring physician. _______________________________________________________________ 

## 2019-11-28 ENCOUNTER — Ambulatory Visit (INDEPENDENT_AMBULATORY_CARE_PROVIDER_SITE_OTHER): Payer: Medicare Other | Admitting: Family Medicine

## 2019-11-28 ENCOUNTER — Other Ambulatory Visit: Payer: Self-pay

## 2019-11-28 DIAGNOSIS — E538 Deficiency of other specified B group vitamins: Secondary | ICD-10-CM

## 2019-12-27 ENCOUNTER — Other Ambulatory Visit: Payer: Self-pay

## 2019-12-27 ENCOUNTER — Inpatient Hospital Stay (HOSPITAL_COMMUNITY): Payer: Medicare Other | Attending: Hematology

## 2019-12-27 DIAGNOSIS — D509 Iron deficiency anemia, unspecified: Secondary | ICD-10-CM | POA: Diagnosis not present

## 2019-12-27 DIAGNOSIS — D519 Vitamin B12 deficiency anemia, unspecified: Secondary | ICD-10-CM

## 2019-12-27 DIAGNOSIS — D500 Iron deficiency anemia secondary to blood loss (chronic): Secondary | ICD-10-CM

## 2019-12-27 LAB — CBC WITH DIFFERENTIAL/PLATELET
Abs Immature Granulocytes: 0.01 10*3/uL (ref 0.00–0.07)
Basophils Absolute: 0 10*3/uL (ref 0.0–0.1)
Basophils Relative: 1 %
Eosinophils Absolute: 0.1 10*3/uL (ref 0.0–0.5)
Eosinophils Relative: 2 %
HCT: 39 % (ref 36.0–46.0)
Hemoglobin: 12.3 g/dL (ref 12.0–15.0)
Immature Granulocytes: 0 %
Lymphocytes Relative: 26 %
Lymphs Abs: 1.2 10*3/uL (ref 0.7–4.0)
MCH: 28.1 pg (ref 26.0–34.0)
MCHC: 31.5 g/dL (ref 30.0–36.0)
MCV: 89 fL (ref 80.0–100.0)
Monocytes Absolute: 0.5 10*3/uL (ref 0.1–1.0)
Monocytes Relative: 10 %
Neutro Abs: 2.8 10*3/uL (ref 1.7–7.7)
Neutrophils Relative %: 61 %
Platelets: 198 10*3/uL (ref 150–400)
RBC: 4.38 MIL/uL (ref 3.87–5.11)
RDW: 20.6 % — ABNORMAL HIGH (ref 11.5–15.5)
WBC: 4.6 10*3/uL (ref 4.0–10.5)
nRBC: 0 % (ref 0.0–0.2)

## 2019-12-27 LAB — FERRITIN: Ferritin: 73 ng/mL (ref 11–307)

## 2019-12-27 LAB — IRON AND TIBC
Iron: 73 ug/dL (ref 28–170)
Saturation Ratios: 25 % (ref 10.4–31.8)
TIBC: 297 ug/dL (ref 250–450)
UIBC: 224 ug/dL

## 2019-12-31 ENCOUNTER — Other Ambulatory Visit: Payer: Self-pay

## 2019-12-31 ENCOUNTER — Ambulatory Visit (INDEPENDENT_AMBULATORY_CARE_PROVIDER_SITE_OTHER): Payer: Medicare Other | Admitting: Family Medicine

## 2019-12-31 DIAGNOSIS — E538 Deficiency of other specified B group vitamins: Secondary | ICD-10-CM | POA: Diagnosis not present

## 2020-01-30 ENCOUNTER — Ambulatory Visit (INDEPENDENT_AMBULATORY_CARE_PROVIDER_SITE_OTHER): Payer: Medicare Other | Admitting: Gastroenterology

## 2020-01-30 ENCOUNTER — Other Ambulatory Visit: Payer: Self-pay

## 2020-01-30 ENCOUNTER — Encounter (INDEPENDENT_AMBULATORY_CARE_PROVIDER_SITE_OTHER): Payer: Self-pay | Admitting: Gastroenterology

## 2020-01-30 DIAGNOSIS — K219 Gastro-esophageal reflux disease without esophagitis: Secondary | ICD-10-CM | POA: Diagnosis not present

## 2020-01-30 MED ORDER — PANTOPRAZOLE SODIUM 40 MG PO TBEC: DELAYED_RELEASE_TABLET | ORAL | 3 refills | Status: DC

## 2020-01-30 NOTE — Patient Instructions (Signed)
Please call us with any change in symptoms especially dark stools, anemia, fatigue. Otherwise continue protonix once a day. Follow up 1 year.

## 2020-01-30 NOTE — Progress Notes (Signed)
Patient profile: Jenna [name] is a 84 y.o. female seen for follow up - she was last seen 01/2019.  Past medical history of A. fib on Xarelto, hypertension, sleep apnea, hypothyroidism, B12 deficiency.     History of Present Illness: Jenna [name] is seen today for follow-up.  She reports overall doing well symptomatically since her last visit.  She is not having any indigestion symptoms. She denies any nausea, vomiting, dysphagia, epigastric pain. Intentionally lost weight.  States her appetite is great.  She uses prune juice for bowel regularity- denies any constipation or diarrhea, melena, rectal bleeding.  She feels if she drinks prune juice she has a bowel movement daily.  She does not use NSAIDs.  No tobacco or alcohol.  Wt Readings from Last 3 Encounters:  01/30/20 206 lb 8 oz (93.7 kg)  11/21/19 209 lb (94.8 kg)  11/13/19 211 lb 4.8 oz (95.8 kg)     EGD 04/24/2018:  - Normal esophagus. - Z-line irregular, 41 cm from the incisors. - A single gastric polyp with ulcerated surface.. Biopsied. Clip (MR conditional) was placed. - Normal duodenal bulb and second portion of the duodenum.   Colonoscopy 04/24/2018:  - The examined portion of the ileum was normal. - Diverticulosis in the sigmoid colon. - External hemorrhoids. - Anal papilla(e) were hypertrophied. - No specimens collected.   Past Medical History:  Past Medical History:  Diagnosis Date  . Cataract   . DDD (degenerative disc disease)   . Fibromyalgia   . Herpes zoster   . History of skin cancer   . Hyperlipidemia   . Hypertension   . Hypothyroidism   . Iron deficiency anemia due to chronic blood loss 04/06/2018  . OA (osteoarthritis)   . OSA on CPAP 01/16/2018   Mild OSA overall with an AHI of 5.8/h but during REM sleep moderate with an AHI of 28/h.  Oxygen saturations dropped to 76% during respiratory events.  Now on CPAP at 9 cm H2O   . Osteoporosis   . Palpitations   . Pre-diabetes   . PUD  (peptic ulcer disease)   . Skin cancer of nose    Jenna [name]    Problem List: Patient Active Problem List   Diagnosis Date Noted  . Iron deficiency anemia due to chronic blood loss 04/06/2018  . Guaiac positive stools 03/30/2018  . OSA on CPAP 01/16/2018  . B12 deficiency anemia 07/15/2017  . Hypertension   . Precordial chest pain 02/05/2014  . History of shingles 01/11/2014  . Pre-diabetes 11/21/2013  . Obesity (BMI 30.0-34.9) 11/21/2013  . Metabolic syndrome 16/02/9603  . Statin intolerance 10/23/2013  . Osteopenia of the elderly 07/25/2013  . Vitamin D deficiency 06/19/2013  . Gout 02/21/2013  . Osteoarthritis 02/21/2013  . Hypothyroidism   . Fibromyalgia   . PUD (peptic ulcer disease)   . Hyperlipidemia 11/14/2008  . AF (paroxysmal atrial fibrillation) ([place]) 11/14/2008    Past Surgical History: Past Surgical History:  Procedure Laterality Date  . ABDOMINAL HYSTERECTOMY     partial  . BIOPSY  04/24/2018   Procedure: BIOPSY;  Surgeon: Rogene Houston, MD;  Location: [facility];  Service: Endoscopy;;  gastric body ulcerated surface  . BREAST BIOPSY     Right  . CATARACT EXTRACTION W/PHACO  10/28/2011   Procedure: CATARACT EXTRACTION PHACO AND INTRAOCULAR LENS PLACEMENT (IOC);  Surgeon: Tonny Branch, MD;  Location: [facility] ORS;  Service: Ophthalmology;  Laterality: Right;  CDE 18.38  . CATARACT EXTRACTION  [facility]  02/14/2012   Procedure: CATARACT EXTRACTION PHACO AND INTRAOCULAR LENS PLACEMENT (IOC);  Surgeon: Tonny Branch, MD;  Location: [facility] OR[facility];  Service: Ophthalmology;  Laterality: Left;  CDE 17.20  . CESAREAN SECTION    . COLONOSCOPY N/A 05/31/2013   Procedure: COLONOSCOPY;  Surgeon: Rogene Houston, MD;  Location: [facility];  Service: Endoscopy;  Laterality: N/A;  240  . COLONOSCOPY N/A 04/24/2018   Procedure: COLONOSCOPY;  Surgeon: Rogene Houston, MD;  [facility];  Service: Endoscopy;  Laterality: N/A;  9:30  . ESOPHAGOGASTRODUODENOSCOPY N/A 04/24/2018    Procedure: ESOPHAGOGASTRODUODENOSCOPY (EGD);  Surgeon: Rogene Houston, MD;  [facility];  Service: Endoscopy;  Laterality: N/A;  . EYE SURGERY     skin removal   . FRACTURE SURGERY  1952   fractured right arm  . LEFT HEART CATH AND CORONARY ANGIOGRAPHY N/A 09/25/2018   Procedure: LEFT HEART CATH AND CORONARY ANGIOGRAPHY;  Surgeon: Jettie Booze, MD;  Location: [facility];  Service: Cardiovascular;  Laterality: N/A;    Allergies: Allergies  Allergen Reactions  . Daypro [Oxaprozin]     Unknown reaction  . Diclofenac Sodium     Unknown reaction  . Effexor [Venlafaxine Hydrochloride]     Unknown reaction  . Prozac [Fluoxetine Hcl]     Unknown reaction  . Amitriptyline Rash  . Cymbalta [Duloxetine Hcl] Nausea Only  . Lipitor [Atorvastatin Calcium] Other (See Comments)    Leg aches  . Penicillins Rash    Has patient had a PCN reaction causing immediate rash, facial/tongue/throat swelling, SOB or lightheadedness with hypotension: Yes Has patient had a PCN reaction causing severe rash involving mucus membranes or skin necrosis: No Has patient had a PCN reaction that required hospitalization: No Has patient had a PCN reaction occurring within the last 10 years: No If all of the above answers are "NO", then may proceed with Cephalosporin use.   Jenna [name] [Milnacipran Hcl] Rash  . Sulfa Antibiotics Rash  . Zocor [Simvastatin] Other (See Comments)    Hip pain      Home Medications:  Current Outpatient Medications:  .  acetaminophen (TYLENOL) 500 MG tablet, Take 500 mg by mouth daily as needed for moderate pain or headache. , Disp: , Rfl:  .  Cholecalciferol (VITAMIN D3) 2000 UNITS TABS, Take 2,000 Units by mouth daily. , Disp: , Rfl:  .  COLCRYS 0.6 MG tablet, TAKE 1 TABLET DAILY AS NEEDED FOR GOUT FLARE UP, Disp: 30 tablet, Rfl: 0 .  furosemide (LASIX) 40 MG tablet, Take 40 mg by mouth as needed. , Disp: , Rfl:  .  HYDROcodone-acetaminophen (NORCO/VICODIN)  5-325 MG tablet, Take 1 tablet by mouth every 6 (six) hours as needed for moderate pain., Disp: , Rfl:  .  hydrocortisone (ANUSOL-HC) 2.5 % rectal cream, APPLY RECTALLY DAILY AS NEEDED FOR HEMMORHOIDS OR ITCHING., Disp: 30 g, Rfl: 0 .  levothyroxine (SYNTHROID) 75 MCG tablet, Take 1 tablet (75 mcg total) by mouth daily., Disp: 90 tablet, Rfl: 3 .  metoprolol succinate (TOPROL-XL) 50 MG 24 hr tablet, TAKE 1 TABLET DAILY WITH OR IMMEDIATELY FOLLOWING A MEAL., Disp: 90 tablet, Rfl: 3 .  nitroGLYCERIN (NITROSTAT) 0.4 MG SL tablet, Place 1 tablet (0.4 mg total) under the tongue every 5 (five) minutes x 3 doses as needed for chest pain., Disp: 30 tablet, Rfl: 0 .  pantoprazole (PROTONIX) 40 MG tablet, TAKE 1 TABLET BY MOUTH DAILY BEFORE BREAKFAST., Disp: 90 tablet, Rfl: 3 .  rivaroxaban (XARELTO) 20 MG TABS tablet, Take 1 tablet (20 mg total) by mouth daily with supper., Disp: 30 tablet, Rfl: 5 .  gabapentin (NEURONTIN) 100 MG capsule, Take 1 capsule (100 mg total) by mouth 3 (three) times daily. (Patient not taking: Reported on 01/30/2020), Disp: 90 capsule, Rfl: 3  Current Facility-Administered Medications:  .  cyanocobalamin ((VITAMIN B-12)) injection 1,000 mcg, 1,000 mcg, Intramuscular, Q30 days, Dettinger, Fransisca Kaufmann, MD, 1,000 mcg at 12/31/19 1506   Family History: family history includes Arthritis in her brother; Bronchitis in her sister; CAD in her brother; COPD in her sister; CVA in her mother; Cancer in her brother, brother, brother, and father; Carpal tunnel syndrome in her sister; Diabetes in her son; Early death in her brother; Gout in her brother and brother; Heart Problems in her brother; Heart attack in her brother; Hypertension in her brother; Osteoporosis in her mother; Stroke in her brother and mother.    Social History:   reports that she has never smoked. She has never used smokeless tobacco. She reports that she does not drink alcohol and does not use drugs.   Review of  Systems: Constitutional: Denies weight loss/weight gain  Eyes: No changes in vision. ENT: No oral lesions, sore throat.  GI: see HPI.  Heme/Lymph: No easy bruising.  CV: No chest pain.  GU: No hematuria.  Integumentary: No rashes.  Neuro: No headaches.  Psych: No depression/anxiety.  Endocrine: No heat/cold intolerance.  Allergic/Immunologic: No urticaria.  Resp: No cough, SOB.  Musculoskeletal: No joint swelling.    Physical Examination: BP (!) 143/79 (BP Location: Right Arm, Patient Position: Sitting, Cuff Size: Large)   Pulse 66   Temp 98.7 F (37.1 C) (Oral)   Ht 5\' 6"  (1.676 m)   Wt 206 lb 8 oz (93.7 kg)   BMI 33.33 kg/m  Gen: NAD, alert and oriented x 4 HEENT: PEERLA, EOMI, Neck: supple, no JVD Chest: CTA bilaterally, no wheezes, crackles, or other adventitious sounds CV: RRR, no m/g/c/r Abd: soft, NT, ND, +BS in all four quadrants; no HSM, guarding, ridigity, or rebound tenderness Ext: no edema, well perfused with 2+ pulses, Skin: no rash or lesions noted on observed skin Lymph: no noted LAD  Data Reviewed:  Labs from June 2021 noted during visit showing hemoglobin 9.3 with MCV of 81.  Ferritin 7, iron 21.   Repeat labs December 27, 2019-hemoglobin 12.3, ferritin 73   Assessment/Plan: Jenna [name] is a 84 y.o. female need for yearly  1.  GERD-asymptomatic on Protonix 40 mg once a day which she will continue.  She has no upper GI symptoms.  She had an upper endoscopy in 2019. Diet modifications reviewed.   2.  IDA-noted that she had a drop in her hemoglobin June 2021.  She denies any dark stools or blood in stool at that time.  States she did have fatigue with the anemia.  She had 2 doses of IV iron and hemoglobin has improved to normal, fatigue resolved.  Given she is feeling well we will hold off on further evaluation but reviewed if she becomes anemic or requires iron in future important to notify our office and would consider hemoccult testing, endoscopy, etc.   She is on Eliquis. UTD on colonsocopy  Follow-up 1 year-sooner if needed   Jenna [name] was seen today for gastroesophageal reflux.  Diagnoses and all orders for this visit:  Gastroesophageal reflux disease -     pantoprazole (PROTONIX) 40 MG tablet; TAKE 1 TABLET BY MOUTH DAILY  BEFORE BREAKFAST.     F/up 1 year if doing well-call sooner if issues    I personally performed the service, non-incident to. (WP)  Laurine Blazer, [facility]

## 2020-01-31 ENCOUNTER — Ambulatory Visit (INDEPENDENT_AMBULATORY_CARE_PROVIDER_SITE_OTHER): Payer: Medicare Other | Admitting: *Deleted

## 2020-01-31 DIAGNOSIS — E538 Deficiency of other specified B group vitamins: Secondary | ICD-10-CM | POA: Diagnosis not present

## 2020-01-31 NOTE — Progress Notes (Signed)
Patient in today for monthly B12 injection. 1000 mcg given in left deltoid. Patient tolerated well.  °

## 2020-02-08 ENCOUNTER — Inpatient Hospital Stay (HOSPITAL_COMMUNITY): Payer: Medicare Other | Attending: Hematology

## 2020-02-08 ENCOUNTER — Other Ambulatory Visit: Payer: Self-pay

## 2020-02-08 DIAGNOSIS — D500 Iron deficiency anemia secondary to blood loss (chronic): Secondary | ICD-10-CM

## 2020-02-08 DIAGNOSIS — D509 Iron deficiency anemia, unspecified: Secondary | ICD-10-CM | POA: Diagnosis present

## 2020-02-08 DIAGNOSIS — D519 Vitamin B12 deficiency anemia, unspecified: Secondary | ICD-10-CM

## 2020-02-08 LAB — IRON AND TIBC
Iron: 64 ug/dL (ref 28–170)
Saturation Ratios: 20 % (ref 10.4–31.8)
TIBC: 325 ug/dL (ref 250–450)
UIBC: 261 ug/dL

## 2020-02-08 LAB — CBC WITH DIFFERENTIAL/PLATELET
Abs Immature Granulocytes: 0.01 10*3/uL (ref 0.00–0.07)
Basophils Absolute: 0 10*3/uL (ref 0.0–0.1)
Basophils Relative: 1 %
Eosinophils Absolute: 0.1 10*3/uL (ref 0.0–0.5)
Eosinophils Relative: 2 %
HCT: 39.6 % (ref 36.0–46.0)
Hemoglobin: 12.9 g/dL (ref 12.0–15.0)
Immature Granulocytes: 0 %
Lymphocytes Relative: 18 %
Lymphs Abs: 1.1 10*3/uL (ref 0.7–4.0)
MCH: 30.2 pg (ref 26.0–34.0)
MCHC: 32.6 g/dL (ref 30.0–36.0)
MCV: 92.7 fL (ref 80.0–100.0)
Monocytes Absolute: 0.6 10*3/uL (ref 0.1–1.0)
Monocytes Relative: 10 %
Neutro Abs: 4 10*3/uL (ref 1.7–7.7)
Neutrophils Relative %: 69 %
Platelets: 184 10*3/uL (ref 150–400)
RBC: 4.27 MIL/uL (ref 3.87–5.11)
RDW: 16.7 % — ABNORMAL HIGH (ref 11.5–15.5)
WBC: 5.8 10*3/uL (ref 4.0–10.5)
nRBC: 0 % (ref 0.0–0.2)

## 2020-02-08 LAB — FERRITIN: Ferritin: 37 ng/mL (ref 11–307)

## 2020-02-15 ENCOUNTER — Other Ambulatory Visit: Payer: Self-pay

## 2020-02-15 ENCOUNTER — Inpatient Hospital Stay (HOSPITAL_BASED_OUTPATIENT_CLINIC_OR_DEPARTMENT_OTHER): Payer: Medicare Other | Admitting: Nurse Practitioner

## 2020-02-15 DIAGNOSIS — D500 Iron deficiency anemia secondary to blood loss (chronic): Secondary | ICD-10-CM

## 2020-02-15 DIAGNOSIS — D509 Iron deficiency anemia, unspecified: Secondary | ICD-10-CM | POA: Diagnosis not present

## 2020-02-15 NOTE — Assessment & Plan Note (Addendum)
1.  Iron deficiency anemia: -Initial evaluation on 03/15/2018 showed hemoglobin 8.9, platelets 267, ferritin 12 -She was last treated with IV iron on 11/15/2019 and 11/22/2019 -Patient had a EGD and colonoscopy on 04/24/2018 which was negative for any malignancy. -Labs done on 02/08/2020 showed hemoglobin 12.9, ferritin 37, percent saturation 20 -Patient reports the iron infusions do not help however her energy levels have fell again and she is very fatigued. -We will set her up with 2 IV iron infusions -She will follow-up in 4 months with repeat labs.

## 2020-02-15 NOTE — Progress Notes (Signed)
[place], [place] [address]   CLINIC:  Medical Oncology/Hematology  PCP:  Dettinger, Jenna Kaufmann, MD [place] [address] [phone]   REASON FOR VISIT: Follow-up for iron deficiency anemia   CURRENT THERAPY: Intermittent IV iron   INTERVAL HISTORY:  Jenna Jenna [name] 84 y.o. female returns for routine follow-up for iron deficiency anemia.  Patient reports she is slowly been feeling fatigued again since her iron infusions.  Patient denies any bright red bleeding per rectum or melena. Denies any nausea, vomiting, or diarrhea. Denies any new pains. Had not noticed any recent bleeding such Jenna [name] epistaxis, hematuria or hematochezia. Denies recent chest pain on exertion, shortness of breath on minimal exertion, pre-syncopal episodes, or palpitations. Denies any numbness or tingling in hands or feet. Denies any recent fevers, infections, or recent hospitalizations. Patient reports appetite at 100% and energy level at 50%.  She is eating maintaining her weight at this time. The infant    REVIEW OF SYSTEMS:  Review of Systems  Constitutional: Positive for fatigue.  Neurological: Positive for numbness.  All other systems reviewed and are negative.    PAST MEDICAL/SURGICAL HISTORY:  Past Medical History:  Diagnosis Date  . Cataract   . DDD (degenerative disc disease)   . Fibromyalgia   . Herpes zoster   . History of skin cancer   . Hyperlipidemia   . Hypertension   . Hypothyroidism   . Iron deficiency anemia due to chronic blood loss 04/06/2018  . OA (osteoarthritis)   . OSA on CPAP 01/16/2018   Mild OSA overall with an AHI of 5.8/h but during REM sleep moderate with an AHI of 28/h.  Oxygen saturations dropped to 76% during respiratory events.  Now on CPAP at 9 cm H2O   . Osteoporosis   . Palpitations   . Pre-diabetes   . PUD (peptic ulcer disease)   . Skin cancer of nose    Jenna Jenna [name]   Past Surgical History:  Procedure  Laterality Date  . ABDOMINAL HYSTERECTOMY     partial  . BIOPSY  04/24/2018   Procedure: BIOPSY;  Surgeon: Jenna Houston, MD;  [facility]: [facility];  Service: Endoscopy;;  gastric body ulcerated surface  . BREAST BIOPSY     Right  . CATARACT EXTRACTION W/PHACO  10/28/2011   Procedure: CATARACT EXTRACTION PHACO AND INTRAOCULAR LENS PLACEMENT (IOC);  Surgeon: Jenna Branch, MD;  Location: [facility];  Service: Ophthalmology;  Laterality: Right;  CDE 18.38  . CATARACT EXTRACTION W/PHACO  02/14/2012   Procedure: CATARACT EXTRACTION PHACO AND INTRAOCULAR LENS PLACEMENT (IOC);  Surgeon: Jenna Branch, MD;  Location: [facility];  Service: Ophthalmology;  Laterality: Left;  CDE 17.20  . CESAREAN SECTION    . COLONOSCOPY N/A 05/31/2013   Procedure: COLONOSCOPY;  Surgeon: Jenna Houston, MD;  [facility];  Service: Endoscopy;  Laterality: N/A;  240  . COLONOSCOPY N/A 04/24/2018   Procedure: COLONOSCOPY;  Surgeon: Jenna Houston, MD;  [facility];  Service: Endoscopy;  Laterality: N/A;  9:30  . ESOPHAGOGASTRODUODENOSCOPY N/A 04/24/2018   Procedure: ESOPHAGOGASTRODUODENOSCOPY (EGD);  Surgeon: Jenna Houston, MD;  [facility];  Service: Endoscopy;  Laterality: N/A;  . EYE SURGERY     skin removal   . FRACTURE SURGERY  1952   fractured right arm  . LEFT HEART CATH AND CORONARY ANGIOGRAPHY N/A 09/25/2018   Procedure: LEFT HEART CATH AND CORONARY ANGIOGRAPHY;  Surgeon:  Jenna Booze, MD;  Location: [facility];  Service: Cardiovascular;  Laterality: N/A;     SOCIAL HISTORY:  Social History   Socioeconomic History  . Marital status: Married    Spouse name: Jenna Jenna [name]  . Number of children: 1  . Years of education: 76  . Highest education level: 9th grade  Occupational History  . Occupation: Retired    Comment: Therapist, art  Tobacco Use  . Smoking status: Never Smoker  . Smokeless tobacco: Never Used  Vaping Use  . Vaping Use: Never used    Substance and Sexual Activity  . Alcohol use: No  . Drug use: No  . Sexual activity: Not Currently    Birth control/protection: Surgical  Other Topics Concern  . Not on file  Social History Narrative   Married   Lives in a one story home    Social Determinants of Health   Financial Resource Strain:   . Difficulty of Paying Living Expenses: Not on file  Food Insecurity:   . Worried About Charity fundraiser in the Last Year: Not on file  . Ran Out of Food in the Last Year: Not on file  Transportation Needs:   . Lack of Transportation (Medical): Not on file  . Lack of Transportation (Non-Medical): Not on file  Physical Activity:   . Days of Exercise per Week: Not on file  . Minutes of Exercise per Session: Not on file  Stress:   . Feeling of Stress : Not on file  Social Connections:   . Frequency of Communication with Friends and Family: Not on file  . Frequency of Social Gatherings with Friends and Family: Not on file  . Attends Religious Services: Not on file  . Active Member of Clubs or Organizations: Not on file  . Attends Archivist Meetings: Not on file  . Marital Status: Not on file  Intimate Partner Violence:   . Fear of Current or Ex-Partner: Not on file  . Emotionally Abused: Not on file  . Physically Abused: Not on file  . Sexually Abused: Not on file    FAMILY HISTORY:  Family History  Problem Relation Age of Onset  . CVA Mother   . Stroke Mother   . Osteoporosis Mother   . Cancer Father        prostate  . Bronchitis Sister   . COPD Sister   . Early death Brother        died in [place]  . Carpal tunnel syndrome Sister   . Cancer Brother        throat  . Cancer Brother        lung  . Cancer Brother        throat  . Stroke Brother   . Heart attack Brother   . Gout Brother   . Heart Problems Brother        stents  . CAD Brother   . Hypertension Brother   . Gout Brother   . Arthritis Brother   . Diabetes Son   . Coronary artery  disease Neg Hx        No premature  . Anesthesia problems Neg Hx   . Hypotension Neg Hx   . Malignant hyperthermia Neg Hx   . Pseudochol deficiency Neg Hx     CURRENT MEDICATIONS:  Outpatient Encounter Medications Jenna [name] of 02/15/2020  Medication Sig  . acetaminophen (TYLENOL) 500 MG tablet Take 500 mg by mouth daily  Jenna [name] needed for moderate pain or headache.   . Cholecalciferol (VITAMIN D3) 2000 UNITS TABS Take 2,000 Units by mouth daily.   [name] COLCRYS 0.6 MG tablet TAKE 1 TABLET DAILY Jenna [name] NEEDED FOR GOUT FLARE UP  . furosemide (LASIX) 40 MG tablet Take 40 mg by mouth Jenna [name] needed.   . gabapentin (NEURONTIN) 100 MG capsule Take 1 capsule (100 mg total) by mouth 3 (three) times daily.  [name] HYDROcodone-acetaminophen (NORCO/VICODIN) 5-325 MG tablet Take 1 tablet by mouth every 6 (six) hours Jenna [name] needed for moderate pain.  . hydrocortisone (ANUSOL-HC) 2.5 % rectal cream APPLY RECTALLY DAILY Jenna [name] NEEDED FOR HEMMORHOIDS OR ITCHING.  [name] levothyroxine (SYNTHROID) 75 MCG tablet Take 1 tablet (75 mcg total) by mouth daily.  . metoprolol succinate (TOPROL-XL) 50 MG 24 hr tablet TAKE 1 TABLET DAILY WITH OR IMMEDIATELY FOLLOWING A MEAL.  . nitroGLYCERIN (NITROSTAT) 0.4 MG SL tablet Place 1 tablet (0.4 mg total) under the tongue every 5 (five) minutes x 3 doses Jenna [name] needed for chest pain.  . pantoprazole (PROTONIX) 40 MG tablet TAKE 1 TABLET BY MOUTH DAILY BEFORE BREAKFAST.  . rivaroxaban (XARELTO) 20 MG TABS tablet Take 1 tablet (20 mg total) by mouth daily with supper.   Facility-Administered Encounter Medications Jenna [name] of 02/15/2020  Medication  . cyanocobalamin ((VITAMIN B-12)) injection 1,000 mcg    ALLERGIES:  Allergies  Allergen Reactions  . Daypro [Oxaprozin]     Unknown reaction  . Diclofenac Sodium     Unknown reaction  . Effexor [Venlafaxine Hydrochloride]     Unknown reaction  . Lidocaine-Prilocaine   . Prozac [Fluoxetine Hcl]     Unknown reaction  . Amitriptyline Rash  . Cymbalta [Duloxetine Hcl]  Nausea Only  . Duloxetine Nausea Only  . Lipitor [Atorvastatin Calcium] Other (See Comments)    Leg aches  . Penicillins Rash    Has patient had a PCN reaction causing immediate rash, facial/tongue/throat swelling, SOB or lightheadedness with hypotension: Yes Has patient had a PCN reaction causing severe rash involving mucus membranes or skin necrosis: No Has patient had a PCN reaction that required hospitalization: No Has patient had a PCN reaction occurring within the last 10 years: No If all of the above answers are "NO", then may proceed with Cephalosporin use.   [name] [Milnacipran Hcl] Rash  . Sulfa Antibiotics Rash  . Zocor [Simvastatin] Other (See Comments)    Hip pain     PHYSICAL EXAM:  ECOG Performance status: 1  Vitals:   02/15/20 1303  BP: (!) 140/50  Pulse: 64  Resp: 16  Temp: (!) 96.2 F (35.7 C)  SpO2: 96%   Filed Weights   02/15/20 1303  Weight: 207 lb (93.9 kg)   Physical Exam Constitutional:      Appearance: Normal appearance. She is normal weight.  Cardiovascular:     Rate and Rhythm: Normal rate and regular rhythm.     Heart sounds: Normal heart sounds.  Pulmonary:     Effort: Pulmonary effort is normal.     Breath sounds: Normal breath sounds.  Abdominal:     General: Bowel sounds are normal.     Palpations: Abdomen is soft.  Musculoskeletal:        General: Normal range of motion.  Skin:    General: Skin is warm.  Neurological:     Mental Status: She is alert and oriented to person, place, and time. Mental status is at baseline.  Psychiatric:        Mood  and Affect: Mood normal.        Behavior: Behavior normal.        Thought Content: Thought content normal.        Judgment: Judgment normal.      LABORATORY DATA:  I have reviewed the labs Jenna [name] listed.  CBC    Component Value Date/Time   WBC 5.8 02/08/2020 1325   RBC 4.27 02/08/2020 1325   HGB 12.9 02/08/2020 1325   HGB 10.5 (L) 08/20/2019 1151   HCT 39.6 02/08/2020 1325    HCT 32.4 (L) 08/20/2019 1151   PLT 184 02/08/2020 1325   PLT 223 08/20/2019 1151   MCV 92.7 02/08/2020 1325   MCV 84 08/20/2019 1151   MCH 30.2 02/08/2020 1325   MCHC 32.6 02/08/2020 1325   RDW 16.7 (H) 02/08/2020 1325   RDW 12.8 08/20/2019 1151   LYMPHSABS 1.1 02/08/2020 1325   LYMPHSABS 1.3 08/20/2019 1151   MONOABS 0.6 02/08/2020 1325   EOSABS 0.1 02/08/2020 1325   EOSABS 0.1 08/20/2019 1151   BASOSABS 0.0 02/08/2020 1325   BASOSABS 0.0 08/20/2019 1151   CMP Latest Ref Rng & Units 11/05/2019 08/20/2019 10/30/2018  Glucose 70 - 99 mg/dL 114(H) 111(H) 95  BUN 8 - 23 mg/dL 10 12 11   Creatinine 0.44 - 1.00 mg/dL 0.78 0.68 0.70  Sodium 135 - 145 mmol/L 132(L) 139 134(L)  Potassium 3.5 - 5.1 mmol/L 4.2 4.7 4.4  Chloride 98 - 111 mmol/L 98 101 97(L)  CO2 22 - 32 mmol/L 27 26 30   Calcium 8.9 - 10.3 mg/dL 8.4(L) 8.9 8.9  Total Protein 6.5 - 8.1 g/dL 6.1(L) 6.0 6.4(L)  Total Bilirubin 0.3 - 1.2 mg/dL 0.6 0.5 0.5  Alkaline Phos 38 - 126 U/L 84 119(H) 72  AST 15 - 41 U/L 14(L) 24 15  ALT 0 - 44 U/L 12 16 14     All questions were answered to patient's stated satisfaction. Encouraged patient to call with any new concerns or questions before his next visit to the cancer center and we can certain see him sooner, if needed.     ASSESSMENT & PLAN:  Iron deficiency anemia due to chronic blood loss 1.  Iron deficiency anemia: -Initial evaluation on 03/15/2018 showed hemoglobin 8.9, platelets 267, ferritin 12 -She was last treated with IV iron on 11/15/2019 and 11/22/2019 -Patient had a EGD and colonoscopy on 04/24/2018 which was negative for any malignancy. -Labs done on 02/08/2020 showed hemoglobin 12.9, ferritin 37, percent saturation 20 -Patient reports the iron infusions do not help however her energy levels have fell again and she is very fatigued. -We will set her up with 2 IV iron infusions -She will follow-up in 4 months with repeat labs.     Orders placed this encounter:  Orders  Placed This Encounter  Procedures  . CBC with Differential/Platelet  . Comprehensive metabolic panel  . Ferritin  . Iron and TIBC  . Lactate dehydrogenase  . Vitamin B12  . VITAMIN D 25 Hydroxy (Vit-D Deficiency, Fractures)      Francene Finders, FNP-C [place] [phone]

## 2020-02-19 ENCOUNTER — Other Ambulatory Visit: Payer: Medicare Other

## 2020-02-21 ENCOUNTER — Other Ambulatory Visit: Payer: Medicare Other

## 2020-02-21 ENCOUNTER — Other Ambulatory Visit: Payer: Self-pay

## 2020-02-21 ENCOUNTER — Encounter: Payer: Self-pay | Admitting: Family Medicine

## 2020-02-21 ENCOUNTER — Ambulatory Visit (INDEPENDENT_AMBULATORY_CARE_PROVIDER_SITE_OTHER): Payer: Medicare Other | Admitting: Family Medicine

## 2020-02-21 VITALS — BP 146/60 | HR 64 | Temp 97.7°F | Ht 66.0 in | Wt 208.0 lb

## 2020-02-21 DIAGNOSIS — R7303 Prediabetes: Secondary | ICD-10-CM | POA: Diagnosis not present

## 2020-02-21 DIAGNOSIS — I10 Essential (primary) hypertension: Secondary | ICD-10-CM | POA: Diagnosis not present

## 2020-02-21 DIAGNOSIS — E039 Hypothyroidism, unspecified: Secondary | ICD-10-CM | POA: Diagnosis not present

## 2020-02-21 DIAGNOSIS — E782 Mixed hyperlipidemia: Secondary | ICD-10-CM

## 2020-02-21 LAB — BAYER DCA HB A1C WAIVED: HB A1C (BAYER DCA - WAIVED): 6.5 % (ref <7.0)

## 2020-02-21 NOTE — Progress Notes (Signed)
BP (!) 146/60   Pulse 64   Temp 97.7 F (36.5 C)   Ht 5\' 6"  (1.676 m)   Wt 208 lb (94.3 kg)   SpO2 95%   BMI 33.57 kg/m    Subjective:   Patient ID: Jenna [name], female    DOB: [birth year], 84 y.o.   MRN: [id number]  HPI: Jenna BARTOLOME is a 84 y.o. female presenting on 02/21/2020 for Medical Management of Chronic Issues, Hypothyroidism, Hyperlipidemia, and Diabetes   HPI Hypothyroidism recheck Patient is coming in for thyroid recheck today as well. They deny any issues with hair changes or heat or cold problems or diarrhea or constipation. They deny any chest pain or palpitations. They are currently on levothyroxine 75 micrograms   Hyperlipidemia Patient is coming in for recheck of his hyperlipidemia. The patient is currently taking no medication currently has been diet controlled, has been intolerant of statins and has been on her allergy list.. They deny any issues with myalgias or history of liver damage from it. They deny any focal numbness or weakness or chest pain.   Type 2 diabetes mellitus Patient comes in today for recheck of his diabetes. Patient has been currently taking no medication currently has been monitoring and in the prediabetes range but A1c is 6.5 today, will discuss diet changes. Patient is currently on an ACE inhibitor/ARB. Patient has not seen an ophthalmologist this year. Patient denies any issues with their feet. The symptom started onset as an adult hypothyroidism and hyperlipidemia ARE RELATED TO DM   Relevant past medical, surgical, family and social history reviewed and updated as indicated. Interim medical history since our last visit reviewed. Allergies and medications reviewed and updated.  Review of Systems  Constitutional: Negative for chills and fever.  HENT: Negative for congestion, ear discharge and ear pain.   Eyes: Negative for redness and visual disturbance.  Respiratory: Negative for chest tightness and shortness of breath.     Cardiovascular: Positive for leg swelling. Negative for chest pain.  Genitourinary: Negative for difficulty urinating and dysuria.  Musculoskeletal: Negative for back pain and gait problem.  Skin: Negative for rash.  Neurological: Negative for light-headedness and headaches.  Psychiatric/Behavioral: Negative for agitation and behavioral problems.  All other systems reviewed and are negative.   Per HPI unless specifically indicated above   Allergies as of 02/21/2020      Reactions   Daypro [oxaprozin]    Unknown reaction   Diclofenac Sodium    Unknown reaction   Effexor [venlafaxine Hydrochloride]    Unknown reaction   Lidocaine-prilocaine    Prozac [fluoxetine Hcl]    Unknown reaction   Amitriptyline Rash   Cymbalta [duloxetine Hcl] Nausea Only   Duloxetine Nausea Only   Lipitor [atorvastatin Calcium] Other (See Comments)   Leg aches   Penicillins Rash   Has patient had a PCN reaction causing immediate rash, facial/tongue/throat swelling, SOB or lightheadedness with hypotension: Yes Has patient had a PCN reaction causing severe rash involving mucus membranes or skin necrosis: No Has patient had a PCN reaction that required hospitalization: No Has patient had a PCN reaction occurring within the last 10 years: No If all of the above answers are "NO", then may proceed with Cephalosporin use.   Savella [milnacipran Hcl] Rash   Sulfa Antibiotics Rash   Zocor [simvastatin] Other (See Comments)   Hip pain      Medication List       Accurate as of February 21, 2020  2:45  PM. If you have any questions, ask your nurse or doctor.        STOP taking these medications   gabapentin 100 MG capsule Commonly known as: NEURONTIN Stopped by: Worthy Rancher, MD   HYDROcodone-acetaminophen 5-325 MG tablet Commonly known as: NORCO/VICODIN Stopped by: Fransisca Kaufmann Athony Coppa, MD     TAKE these medications   acetaminophen 500 MG tablet Commonly known as: TYLENOL Take 500 mg by  mouth daily as needed for moderate pain or headache.   Colcrys 0.6 MG tablet Generic drug: colchicine TAKE 1 TABLET DAILY AS NEEDED FOR GOUT FLARE UP   furosemide 40 MG tablet Commonly known as: LASIX Take 40 mg by mouth as needed.   hydrocortisone 2.5 % rectal cream Commonly known as: ANUSOL-HC APPLY RECTALLY DAILY AS NEEDED FOR HEMMORHOIDS OR ITCHING.   levothyroxine 75 MCG tablet Commonly known as: SYNTHROID Take 1 tablet (75 mcg total) by mouth daily.   metoprolol succinate 50 MG 24 hr tablet Commonly known as: TOPROL-XL TAKE 1 TABLET DAILY WITH OR IMMEDIATELY FOLLOWING A MEAL.   nitroGLYCERIN 0.4 MG SL tablet Commonly known as: NITROSTAT Place 1 tablet (0.4 mg total) under the tongue every 5 (five) minutes x 3 doses as needed for chest pain.   pantoprazole 40 MG tablet Commonly known as: PROTONIX TAKE 1 TABLET BY MOUTH DAILY BEFORE BREAKFAST.   rivaroxaban 20 MG Tabs tablet Commonly known as: Xarelto Take 1 tablet (20 mg total) by mouth daily with supper.   Vitamin D3 50 MCG (2000 UT) Tabs Take 2,000 Units by mouth daily.        Objective:   BP (!) 146/60   Pulse 64   Temp 97.7 F (36.5 C)   Ht 5\' 6"  (1.676 m)   Wt 208 lb (94.3 kg)   SpO2 95%   BMI 33.57 kg/m   Wt Readings from Last 3 Encounters:  02/21/20 208 lb (94.3 kg)  02/15/20 207 lb (93.9 kg)  01/30/20 206 lb 8 oz (93.7 kg)    Physical Exam Vitals and nursing note reviewed.  Constitutional:      General: She is not in acute distress.    Appearance: She is well-developed. She is not diaphoretic.  Eyes:     Conjunctiva/sclera: Conjunctivae normal.     Pupils: Pupils are equal, round, and reactive to light.  Cardiovascular:     Rate and Rhythm: Normal rate and regular rhythm.     Heart sounds: Normal heart sounds. No murmur heard.   Pulmonary:     Effort: Pulmonary effort is normal. No respiratory distress.     Breath sounds: Normal breath sounds. No wheezing.  Musculoskeletal:         General: Swelling (2+ pitting edema bilaterally, wearing compression stockings, she says it goes down at night.) present. No tenderness. Normal range of motion.  Skin:    General: Skin is warm and dry.     Findings: No rash.  Neurological:     Mental Status: She is alert and oriented to person, place, and time.     Coordination: Coordination normal.  Psychiatric:        Behavior: Behavior normal.     Results for orders placed or performed in visit on 02/21/20  Bayer DCA Hb A1c Waived  Result Value Ref Range   HB A1C (BAYER DCA - WAIVED) 6.5 <7.0 %    Assessment & Plan:   Problem List Items Addressed This Visit      Cardiovascular and Mediastinum  Hypertension     Endocrine   Hypothyroidism   Relevant Orders   TSH     Other   Hyperlipidemia   Pre-diabetes - Primary   Relevant Orders   Bayer DCA Hb A1c Waived (Completed)      A1c is 6.5 which is no longer in the prediabetes range but starting to get to the full-blown diabetes range.  No medication warranted at this time but will discuss doing everything she can to keep it from worsening. Follow up plan: Return in about 3 months (around 05/22/2020), or if symptoms worsen or fail to improve, for Prediabetes and hypothyroidism and hypertension.  Counseling provided for all of the vaccine components Orders Placed This Encounter  Procedures  . Bayer DCA Hb A1c Waived  . TSH    Caryl Pina, MD [facility] 02/21/2020, 2:45 PM

## 2020-02-22 LAB — TSH: TSH: 3.54 u[IU]/mL (ref 0.450–4.500)

## 2020-02-25 ENCOUNTER — Ambulatory Visit (INDEPENDENT_AMBULATORY_CARE_PROVIDER_SITE_OTHER): Payer: Medicare Other

## 2020-02-25 ENCOUNTER — Other Ambulatory Visit: Payer: Self-pay

## 2020-02-25 DIAGNOSIS — Z780 Asymptomatic menopausal state: Secondary | ICD-10-CM

## 2020-02-27 ENCOUNTER — Inpatient Hospital Stay (HOSPITAL_COMMUNITY): Payer: Medicare Other

## 2020-02-29 ENCOUNTER — Other Ambulatory Visit: Payer: Self-pay

## 2020-02-29 ENCOUNTER — Inpatient Hospital Stay (HOSPITAL_COMMUNITY): Payer: Medicare Other | Attending: Nurse Practitioner

## 2020-02-29 VITALS — BP 146/52 | HR 68 | Temp 96.9°F | Resp 18

## 2020-02-29 DIAGNOSIS — D509 Iron deficiency anemia, unspecified: Secondary | ICD-10-CM | POA: Diagnosis present

## 2020-02-29 DIAGNOSIS — D500 Iron deficiency anemia secondary to blood loss (chronic): Secondary | ICD-10-CM

## 2020-02-29 MED ORDER — SODIUM CHLORIDE 0.9 % IV SOLN
510.0000 mg | Freq: Once | INTRAVENOUS | Status: AC
  Administered 2020-02-29: 510 mg via INTRAVENOUS
  Filled 2020-02-29: qty 510

## 2020-02-29 MED ORDER — SODIUM CHLORIDE 0.9 % IV SOLN: Freq: Once | INTRAVENOUS | Status: AC

## 2020-02-29 NOTE — Patient Instructions (Signed)
[facility] at [facility]  Discharge Instructions:   _______________________________________________________________  Thank you for choosing [facility] at [facility] to provide your oncology and hematology care.  To afford each patient quality time with our providers, please arrive at least 15 minutes before your scheduled appointment.  You need to re-schedule your appointment if you arrive 10 or more minutes late.  We strive to give you quality time with our providers, and arriving late affects you and other patients whose appointments are after yours.  Also, if you no show three or more times for appointments you may be dismissed from the clinic.  Again, thank you for choosing [facility] at [facility]. Our hope is that these requests will allow you access to exceptional care and in a timely manner. _______________________________________________________________  If you have questions after your visit, please contact our office at [phone] between the hours of 8:30 a.m. and 5:00 p.m. Voicemails left after 4:30 p.m. will not be returned until the following business day. _______________________________________________________________  For prescription refill requests, have your pharmacy contact our office. _______________________________________________________________  Recommendations made by the consultant and any test results will be sent to your referring physician. _______________________________________________________________ 

## 2020-02-29 NOTE — Progress Notes (Signed)
Iron infusion given per orders. Patient tolerated it well without problems. Vitals stable and discharged home from clinic ambulatory in stable condition. Follow up as scheduled.  

## 2020-03-03 ENCOUNTER — Other Ambulatory Visit: Payer: Self-pay

## 2020-03-03 ENCOUNTER — Telehealth: Payer: Self-pay

## 2020-03-03 ENCOUNTER — Ambulatory Visit (INDEPENDENT_AMBULATORY_CARE_PROVIDER_SITE_OTHER): Payer: Medicare Other

## 2020-03-03 DIAGNOSIS — E538 Deficiency of other specified B group vitamins: Secondary | ICD-10-CM

## 2020-03-03 NOTE — Telephone Encounter (Signed)
Can you review patients bone density? Patient wants to be contacted with results

## 2020-03-05 ENCOUNTER — Ambulatory Visit (HOSPITAL_COMMUNITY): Payer: Medicare Other

## 2020-03-07 ENCOUNTER — Inpatient Hospital Stay (HOSPITAL_COMMUNITY): Payer: Medicare Other

## 2020-03-07 ENCOUNTER — Other Ambulatory Visit: Payer: Self-pay

## 2020-03-07 VITALS — BP 142/44 | HR 68 | Temp 97.2°F | Resp 18

## 2020-03-07 DIAGNOSIS — D509 Iron deficiency anemia, unspecified: Secondary | ICD-10-CM | POA: Diagnosis not present

## 2020-03-07 DIAGNOSIS — D500 Iron deficiency anemia secondary to blood loss (chronic): Secondary | ICD-10-CM

## 2020-03-07 MED ORDER — SODIUM CHLORIDE 0.9 % IV SOLN
510.0000 mg | Freq: Once | INTRAVENOUS | Status: AC
  Administered 2020-03-07: 510 mg via INTRAVENOUS
  Filled 2020-03-07: qty 510

## 2020-03-07 MED ORDER — SODIUM CHLORIDE 0.9 % IV SOLN: Freq: Once | INTRAVENOUS | Status: AC

## 2020-03-07 NOTE — Progress Notes (Signed)
Iron infusion given per orders. Patient tolerated it well without problems. Vitals stable and discharged home from clinic ambulatory. Follow up as scheduled.  

## 2020-03-07 NOTE — Patient Instructions (Signed)
[facility] at [facility]  Discharge Instructions:   _______________________________________________________________  Thank you for choosing [facility] at [facility] to provide your oncology and hematology care.  To afford each patient quality time with our providers, please arrive at least 15 minutes before your scheduled appointment.  You need to re-schedule your appointment if you arrive 10 or more minutes late.  We strive to give you quality time with our providers, and arriving late affects you and other patients whose appointments are after yours.  Also, if you no show three or more times for appointments you may be dismissed from the clinic.  Again, thank you for choosing [facility] at [facility]. Our hope is that these requests will allow you access to exceptional care and in a timely manner. _______________________________________________________________  If you have questions after your visit, please contact our office at [phone] between the hours of 8:30 a.m. and 5:00 p.m. Voicemails left after 4:30 p.m. will not be returned until the following business day. _______________________________________________________________  For prescription refill requests, have your pharmacy contact our office. _______________________________________________________________  Recommendations made by the consultant and any test results will be sent to your referring physician. _______________________________________________________________ 

## 2020-03-10 ENCOUNTER — Other Ambulatory Visit: Payer: Self-pay | Admitting: Family Medicine

## 2020-03-10 ENCOUNTER — Telehealth: Payer: Medicare Other

## 2020-03-20 NOTE — Progress Notes (Signed)
No number in chart sent message with results in MyChart

## 2020-03-26 ENCOUNTER — Other Ambulatory Visit: Payer: Self-pay | Admitting: Family Medicine

## 2020-04-03 ENCOUNTER — Other Ambulatory Visit: Payer: Self-pay

## 2020-04-03 ENCOUNTER — Ambulatory Visit (INDEPENDENT_AMBULATORY_CARE_PROVIDER_SITE_OTHER): Payer: Medicare Other | Admitting: *Deleted

## 2020-04-03 DIAGNOSIS — E538 Deficiency of other specified B group vitamins: Secondary | ICD-10-CM

## 2020-04-19 ENCOUNTER — Other Ambulatory Visit: Payer: Self-pay | Admitting: Family Medicine

## 2020-04-28 ENCOUNTER — Ambulatory Visit (INDEPENDENT_AMBULATORY_CARE_PROVIDER_SITE_OTHER): Payer: Medicare Other | Admitting: Cardiology

## 2020-04-28 ENCOUNTER — Encounter: Payer: Self-pay | Admitting: Cardiology

## 2020-04-28 VITALS — BP 168/76 | HR 68 | Ht 66.0 in | Wt 210.2 lb

## 2020-04-28 DIAGNOSIS — I480 Paroxysmal atrial fibrillation: Secondary | ICD-10-CM

## 2020-04-28 DIAGNOSIS — I890 Lymphedema, not elsewhere classified: Secondary | ICD-10-CM | POA: Diagnosis not present

## 2020-04-28 NOTE — Progress Notes (Signed)
Cardiology Office Note  Date: 04/28/2020   ID: Jenna [name] 1935/06/11, MRN [id number]  PCP:  Dettinger, Fransisca Kaufmann, MD  Cardiologist:  Rozann Lesches, MD Electrophysiologist:  None   Chief Complaint  Patient presents with  . Cardiac follow-up    History of Present Illness: Jenna [name] is an 84 y.o. female last seen in June.  She presents for a routine visit.  Reports no sense of palpitations, no chest pain or progressive shortness of breath with typical ADLs.  She continues to use compression stockings for treatment of lymphedema.  I reviewed her medications which are outlined below.  She does not report any obvious bleeding problems on Xarelto and continues on Toprol-XL for heart rate control.  She had lab work in September as noted below.  Past Medical History:  Diagnosis Date  . Cataract   . DDD (degenerative disc disease)   . Fibromyalgia   . Herpes zoster   . History of skin cancer   . Hyperlipidemia   . Hypertension   . Hypothyroidism   . Iron deficiency anemia due to chronic blood loss 04/06/2018  . OA (osteoarthritis)   . OSA on CPAP 01/16/2018   Mild OSA overall with an AHI of 5.8/h but during REM sleep moderate with an AHI of 28/h.  Oxygen saturations dropped to 76% during respiratory events.  Now on CPAP at 9 cm H2O   . Osteoporosis   . Palpitations   . Pre-diabetes   . PUD (peptic ulcer disease)   . Skin cancer of nose    Jenna [name]    Past Surgical History:  Procedure Laterality Date  . ABDOMINAL HYSTERECTOMY     partial  . BIOPSY  04/24/2018   Procedure: BIOPSY;  Surgeon: Rogene Houston, MD;  [facility]: [facility];  Service: Endoscopy;;  gastric body ulcerated surface  . BREAST BIOPSY     Right  . CATARACT EXTRACTION W/PHACO  10/28/2011   Procedure: CATARACT EXTRACTION PHACO AND INTRAOCULAR LENS PLACEMENT (IOC);  Surgeon: Tonny Branch, MD;  Location: [facility];  Service: Ophthalmology;  Laterality: Right;  CDE 18.38  . CATARACT  EXTRACTION W/PHACO  02/14/2012   Procedure: CATARACT EXTRACTION PHACO AND INTRAOCULAR LENS PLACEMENT (IOC);  Surgeon: Tonny Branch, MD;  Location: [facility];  Service: Ophthalmology;  Laterality: Left;  CDE 17.20  . CESAREAN SECTION    . COLONOSCOPY N/A 05/31/2013   Procedure: COLONOSCOPY;  Surgeon: Rogene Houston, MD;  [facility]: [facility];  Service: Endoscopy;  Laterality: N/A;  240  . COLONOSCOPY N/A 04/24/2018   Procedure: COLONOSCOPY;  Surgeon: Rogene Houston, MD;  [facility];  Service: Endoscopy;  Laterality: N/A;  9:30  . ESOPHAGOGASTRODUODENOSCOPY N/A 04/24/2018   Procedure: ESOPHAGOGASTRODUODENOSCOPY (EGD);  Surgeon: Rogene Houston, MD;  [facility]: [facility];  Service: Endoscopy;  Laterality: N/A;  . EYE SURGERY     skin removal   . FRACTURE SURGERY  1952   fractured right arm  . LEFT HEART CATH AND CORONARY ANGIOGRAPHY N/A 09/25/2018   Procedure: LEFT HEART CATH AND CORONARY ANGIOGRAPHY;  Surgeon: Jettie Booze, MD;  Location: [facility];  Service: Cardiovascular;  Laterality: N/A;    Current Outpatient Medications  Medication Sig Dispense Refill  . acetaminophen (TYLENOL) 500 MG tablet Take 500 mg by mouth daily as needed for moderate pain or headache.     . Cholecalciferol (VITAMIN D3) 2000 UNITS TABS Take 2,000 Units by mouth daily.     [name]  colchicine 0.6 MG tablet TAKE 1 TABLET DAILY AS NEEDED FOR GOUT FLARE UP 30 tablet 0  . furosemide (LASIX) 40 MG tablet Take 40 mg by mouth as needed.     . hydrocortisone (ANUSOL-HC) 2.5 % rectal cream APPLY RECTALLY DAILY AS NEEDED FOR HEMMORHOIDS OR ITCHING. 30 g 0  . levothyroxine (SYNTHROID) 75 MCG tablet Take 1 tablet (75 mcg total) by mouth daily. 90 tablet 3  . metoprolol succinate (TOPROL-XL) 50 MG 24 hr tablet TAKE 1 TABLET DAILY WITH OR IMMEDIATELY FOLLOWING A MEAL. 90 tablet 3  . nitroGLYCERIN (NITROSTAT) 0.4 MG SL tablet Place 1 tablet (0.4 mg total) under the tongue every 5 (five) minutes x 3 doses as  needed for chest pain. 30 tablet 0  . pantoprazole (PROTONIX) 40 MG tablet TAKE 1 TABLET BY MOUTH DAILY BEFORE BREAKFAST. 90 tablet 3  . XARELTO 20 MG TABS tablet TAKE 1 TABLET BY MOUTH DAILY WITH SUPPER. 30 tablet 1   Current Facility-Administered Medications  Medication Dose Route Frequency Provider Last Rate Last Admin  . cyanocobalamin ((VITAMIN B-12)) injection 1,000 mcg  1,000 mcg Intramuscular Q30 days Dettinger, Fransisca Kaufmann, MD   1,000 mcg at 04/03/20 1437   Allergies:  Daypro [oxaprozin], Diclofenac sodium, Effexor [venlafaxine hydrochloride], Lidocaine-prilocaine, Prozac [fluoxetine hcl], Amitriptyline, Cymbalta [duloxetine hcl], Duloxetine, Lipitor [atorvastatin calcium], Penicillins, Savella [milnacipran hcl], Sulfa antibiotics, and Zocor [simvastatin]   ROS: No orthopnea or PND.  Physical Exam: VS:  BP (!) 168/76   Pulse 68   Ht 5\' 6"  (1.676 m)   Wt 210 lb 3.2 oz (95.3 kg)   SpO2 96%   BMI 33.93 kg/m , BMI Body mass index is 33.93 kg/m.  Wt Readings from Last 3 Encounters:  04/28/20 210 lb 3.2 oz (95.3 kg)  02/21/20 208 lb (94.3 kg)  02/15/20 207 lb (93.9 kg)    General: Patient appears comfortable at rest. HEENT: Conjunctiva and lids normal, wearing a mask. Neck: Supple, no elevated JVP or carotid bruits, no thyromegaly. Lungs: Clear to auscultation, nonlabored breathing at rest. Cardiac: Regular rate and rhythm, no S3 or significant systolic murmur, no pericardial rub. Extremities: Chronic appearing lymphedema, compression stockings in place.  ECG:  An ECG dated 10/24/2019 was personally reviewed today and demonstrated:  Sinus rhythm with nonspecific T wave changes.  Recent Labwork: 11/05/2019: ALT 12; AST 14; BUN 10; Creatinine, Ser 0.78; Potassium 4.2; Sodium 132 02/08/2020: Hemoglobin 12.9; Platelets 184 02/21/2020: TSH 3.540     Component Value Date/Time   CHOL 190 08/20/2019 1151   TRIG 147 08/20/2019 1151   TRIG 161 (H) 10/14/2016 1010   HDL 53 08/20/2019  1151   HDL 54 10/14/2016 1010   CHOLHDL 3.6 08/20/2019 1151   CHOLHDL 3.6 10/16/2008 0540   VLDL 23 10/16/2008 0540   LDLCALC 111 (H) 08/20/2019 1151   LDLCALC 138 (H) 02/26/2014 1001    Other Studies Reviewed Today:  Echocardiogram 03/29/2018: Study Conclusions  - Left ventricle: The cavity size was normal. Wall thickness was increased in a pattern of mild LVH. Systolic function was vigorous. The estimated ejection fraction was in the range of 65% to 70%. Wall motion was normal; there were no regional wall motion abnormalities. Left ventricular diastolic function parameters were normal. - Aortic valve: There was mild regurgitation. Valve area (VTI): 2.92 cm^2. Valve area (Vmax): 2.78 cm^2. Valve area (Vmean): 2.95 cm^2.  Cardiac catheterization 09/25/2018:  Prox LAD lesion is 20% stenosed.  Mid RCA lesion is 10% stenosed.  The left ventricular systolic  function is normal.  LV end diastolic pressure is normal.  The left ventricular ejection fraction is 55-65% by visual estimate.  There is no aortic valve stenosis.  Nonobstructive CAD.   Assessment and Plan:  1.  Paroxysmal atrial fibrillation/flutter.  CHA2DS2-VASc score is 3.  She reports no active palpitations, plan to continue Toprol-XL and Xarelto.  Lab work from September reviewed.  2.  Chronic lymphedema.  Continues to use compression stockings and Lasix.  Last creatinine and potassium normal.  Medication Adjustments/Labs and Tests Ordered: Current medicines are reviewed at length with the patient today.  Concerns regarding medicines are outlined above.   Tests Ordered: No orders of the defined types were placed in this encounter.   Medication Changes: No orders of the defined types were placed in this encounter.   Disposition:  Follow up 6 months in the [place] office.  Signed, Satira Sark, MD, [facility] 04/28/2020 2:40 PM    [facility] [place], [place], [place] [address] Phone: [phone]; Fax: [phone]

## 2020-04-28 NOTE — Patient Instructions (Signed)

## 2020-05-05 ENCOUNTER — Other Ambulatory Visit: Payer: Self-pay

## 2020-05-05 ENCOUNTER — Ambulatory Visit (INDEPENDENT_AMBULATORY_CARE_PROVIDER_SITE_OTHER): Payer: Medicare Other

## 2020-05-05 DIAGNOSIS — E538 Deficiency of other specified B group vitamins: Secondary | ICD-10-CM

## 2020-05-09 ENCOUNTER — Other Ambulatory Visit: Payer: Self-pay | Admitting: Family Medicine

## 2020-05-20 ENCOUNTER — Encounter (INDEPENDENT_AMBULATORY_CARE_PROVIDER_SITE_OTHER): Payer: Self-pay | Admitting: *Deleted

## 2020-05-22 ENCOUNTER — Ambulatory Visit (INDEPENDENT_AMBULATORY_CARE_PROVIDER_SITE_OTHER): Payer: Medicare Other | Admitting: Family Medicine

## 2020-05-22 ENCOUNTER — Encounter: Payer: Self-pay | Admitting: Family Medicine

## 2020-05-22 ENCOUNTER — Other Ambulatory Visit: Payer: Self-pay

## 2020-05-22 VITALS — BP 136/77 | HR 60 | Ht 66.0 in | Wt 206.0 lb

## 2020-05-22 DIAGNOSIS — E039 Hypothyroidism, unspecified: Secondary | ICD-10-CM | POA: Diagnosis not present

## 2020-05-22 DIAGNOSIS — I480 Paroxysmal atrial fibrillation: Secondary | ICD-10-CM

## 2020-05-22 DIAGNOSIS — E782 Mixed hyperlipidemia: Secondary | ICD-10-CM

## 2020-05-22 DIAGNOSIS — E1169 Type 2 diabetes mellitus with other specified complication: Secondary | ICD-10-CM

## 2020-05-22 DIAGNOSIS — I10 Essential (primary) hypertension: Secondary | ICD-10-CM | POA: Diagnosis not present

## 2020-05-22 DIAGNOSIS — E1140 Type 2 diabetes mellitus with diabetic neuropathy, unspecified: Secondary | ICD-10-CM

## 2020-05-22 LAB — BAYER DCA HB A1C WAIVED: HB A1C (BAYER DCA - WAIVED): 6.6 % (ref <7.0)

## 2020-05-22 MED ORDER — RIVAROXABAN 20 MG PO TABS: ORAL_TABLET | ORAL | 3 refills | Status: DC

## 2020-05-22 MED ORDER — METOPROLOL SUCCINATE ER 50 MG PO TB24: ORAL_TABLET | ORAL | 3 refills | Status: DC

## 2020-05-22 MED ORDER — LEVOTHYROXINE SODIUM 75 MCG PO TABS
75.0000 ug | ORAL_TABLET | Freq: Every day | ORAL | 3 refills | Status: DC
Start: 2020-05-22 — End: 2020-08-21

## 2020-05-22 NOTE — Progress Notes (Signed)
BP 136/77   Pulse 60   Ht 5' 6"  (1.676 m)   Wt 206 lb (93.4 kg)   SpO2 98%   BMI 33.25 kg/m    Subjective:   Patient ID: Jenna [name], female    DOB: 1936/01/18, 84 y.o.   MRN: [id number]  HPI: Jenna [name] is a 84 y.o. female presenting on 05/22/2020 for Medical Management of Chronic Issues, Prediabetes, and Hypothyroidism   HPI Hypothyroidism recheck Patient is coming in for thyroid recheck today as well. They deny any issues with hair changes or heat or cold problems or diarrhea or constipation. They deny any chest pain or palpitations. They are currently on levothyroxine 56mcrograms   Type 2 diabetes Patient comes in today for recheck of his diabetes. Patient has been currently taking no medication and has been diet controlled, A1c is up now 6.6. Patient is not currently on an ACE inhibitor/ARB. Patient has seen an ophthalmologist this year. Patient denies any issues with their feet. The symptom started onset as an adult hypothyroidism and hypertension and hyperlipidemia ARE RELATED TO DM   Hypertension Patient is currently on metoprolol and furosemide, and their blood pressure today is 136/77. Patient denies any lightheadedness or dizziness. Patient denies headaches, blurred vision, chest pains, shortness of breath, or weakness. Denies any side effects from medication and is content with current medication.   Hyperlipidemia Patient is coming in for recheck of his hyperlipidemia. The patient is currently taking no medication currently. They deny any issues with myalgias or history of liver damage from it. They deny any focal numbness or weakness or chest pain.   A. Fib Patient is on Xarelto for A. fib. She is also on metoprolol. Patient denies any chest pain or palpitations or bleeding.  Relevant past medical, surgical, family and social history reviewed and updated as indicated. Interim medical history since our last visit reviewed. Allergies and medications reviewed  and updated.  Review of Systems  Constitutional: Negative for chills and fever.  Eyes: Negative for visual disturbance.  Respiratory: Negative for chest tightness and shortness of breath.   Cardiovascular: Negative for chest pain and leg swelling.  Musculoskeletal: Negative for back pain and gait problem.  Skin: Negative for rash.  Neurological: Positive for numbness. Negative for dizziness, weakness, light-headedness and headaches.  Psychiatric/Behavioral: Negative for agitation and behavioral problems.  All other systems reviewed and are negative.   Per HPI unless specifically indicated above   Allergies as of 05/22/2020      Reactions   Daypro [oxaprozin]    Unknown reaction   Diclofenac Sodium    Unknown reaction   Effexor [venlafaxine Hydrochloride]    Unknown reaction   Lidocaine-prilocaine    Prozac [fluoxetine Hcl]    Unknown reaction   Amitriptyline Rash   Cymbalta [duloxetine Hcl] Nausea Only   Duloxetine Nausea Only   Lipitor [atorvastatin Calcium] Other (See Comments)   Leg aches   Penicillins Rash   Has patient had a PCN reaction causing immediate rash, facial/tongue/throat swelling, SOB or lightheadedness with hypotension: Yes Has patient had a PCN reaction causing severe rash involving mucus membranes or skin necrosis: No Has patient had a PCN reaction that required hospitalization: No Has patient had a PCN reaction occurring within the last 10 years: No If all of the above answers are "NO", then may proceed with Cephalosporin use.   Savella [milnacipran Hcl] Rash   Sulfa Antibiotics Rash   Zocor [simvastatin] Other (See Comments)   Hip pain  Medication List       Accurate as of May 22, 2020  4:06 PM. If you have any questions, ask your nurse or doctor.        acetaminophen 500 MG tablet Commonly known as: TYLENOL Take 500 mg by mouth daily as needed for moderate pain or headache.   colchicine 0.6 MG tablet TAKE 1 TABLET DAILY AS NEEDED  FOR GOUT FLARE UP   furosemide 40 MG tablet Commonly known as: LASIX Take 40 mg by mouth as needed.   hydrocortisone 2.5 % rectal cream Commonly known as: ANUSOL-HC APPLY RECTALLY DAILY AS NEEDED FOR HEMMORHOIDS OR ITCHING.   levothyroxine 75 MCG tablet Commonly known as: SYNTHROID Take 1 tablet (75 mcg total) by mouth daily.   metoprolol succinate 50 MG 24 hr tablet Commonly known as: TOPROL-XL TAKE 1 TABLET DAILY WITH OR IMMEDIATELY FOLLOWING A MEAL.   nitroGLYCERIN 0.4 MG SL tablet Commonly known as: NITROSTAT Place 1 tablet (0.4 mg total) under the tongue every 5 (five) minutes x 3 doses as needed for chest pain.   pantoprazole 40 MG tablet Commonly known as: PROTONIX TAKE 1 TABLET BY MOUTH DAILY BEFORE BREAKFAST.   Vitamin D3 50 MCG (2000 UT) Tabs Take 2,000 Units by mouth daily.   Xarelto 20 MG Tabs tablet Generic drug: rivaroxaban TAKE 1 TABLET BY MOUTH DAILY WITH SUPPER.        Objective:   BP 136/77   Pulse 60   Ht 5' 6"  (1.676 m)   Wt 206 lb (93.4 kg)   SpO2 98%   BMI 33.25 kg/m   Wt Readings from Last 3 Encounters:  05/22/20 206 lb (93.4 kg)  04/28/20 210 lb 3.2 oz (95.3 kg)  02/21/20 208 lb (94.3 kg)    Physical Exam Vitals and nursing note reviewed.  Constitutional:      General: She is not in acute distress.    Appearance: She is well-developed and well-nourished. She is not diaphoretic.  Eyes:     Extraocular Movements: EOM normal.     Conjunctiva/sclera: Conjunctivae normal.  Cardiovascular:     Rate and Rhythm: Normal rate and regular rhythm.     Pulses: Intact distal pulses.     Heart sounds: Normal heart sounds. No murmur heard.   Pulmonary:     Effort: Pulmonary effort is normal. No respiratory distress.     Breath sounds: Normal breath sounds. No wheezing.  Musculoskeletal:        General: No tenderness or edema. Normal range of motion.  Skin:    General: Skin is warm and dry.     Findings: No rash.  Neurological:      Mental Status: She is alert and oriented to person, place, and time.     Coordination: Coordination normal.  Psychiatric:        Mood and Affect: Mood and affect normal.        Behavior: Behavior normal.     Assessment & Plan:   Problem List Items Addressed This Visit      Cardiovascular and Mediastinum   AF (paroxysmal atrial fibrillation) (HCC)   Relevant Medications   metoprolol succinate (TOPROL-XL) 50 MG 24 hr tablet   rivaroxaban (XARELTO) 20 MG TABS tablet   Hypertension   Relevant Medications   metoprolol succinate (TOPROL-XL) 50 MG 24 hr tablet   rivaroxaban (XARELTO) 20 MG TABS tablet   Other Relevant Orders   BMP8+EGFR     Endocrine   Hypothyroidism   Relevant Medications  levothyroxine (SYNTHROID) 75 MCG tablet   metoprolol succinate (TOPROL-XL) 50 MG 24 hr tablet   Other Relevant Orders   TSH   Type 2 diabetes mellitus with other specified complication (HCC) - Primary   Relevant Orders   Bayer DCA Hb A1c Waived   BMP8+EGFR     Other   Hyperlipidemia   Relevant Medications   metoprolol succinate (TOPROL-XL) 50 MG 24 hr tablet   rivaroxaban (XARELTO) 20 MG TABS tablet    Other Visit Diagnoses    Type 2 diabetes mellitus with diabetic neuropathy, without long-term current use of insulin ([place])          Patient is having some burning in her feet that she thinks is neuropathy, she has some gabapentin at home and she will try that.  A1c is up to 6.6, refocus on diet.  Follow up plan: Return in about 3 months (around 08/20/2020), or if symptoms worsen or fail to improve, for Type 2 diabetes recheck.  Counseling provided for all of the vaccine components Orders Placed This Encounter  Procedures  . Bayer DCA Hb A1c Waived  . BMP8+EGFR  . TSH    Caryl Pina, MD [facility] 05/22/2020, 4:06 PM

## 2020-05-23 LAB — BMP8+EGFR
BUN/Creatinine Ratio: 18 (ref 12–28)
BUN: 11 mg/dL (ref 8–27)
CO2: 26 mmol/L (ref 20–29)
Calcium: 9 mg/dL (ref 8.7–10.3)
Chloride: 99 mmol/L (ref 96–106)
Creatinine, Ser: 0.61 mg/dL (ref 0.57–1.00)
GFR calc Af Amer: 98 mL/min/{1.73_m2} (ref >59)
GFR calc non Af Amer: 85 mL/min/{1.73_m2} (ref >59)
Glucose: 97 mg/dL (ref 65–99)
Potassium: 4.1 mmol/L (ref 3.5–5.2)
Sodium: 140 mmol/L (ref 134–144)

## 2020-05-23 LAB — TSH: TSH: 2.78 u[IU]/mL (ref 0.450–4.500)

## 2020-06-05 ENCOUNTER — Other Ambulatory Visit: Payer: Self-pay

## 2020-06-05 ENCOUNTER — Ambulatory Visit (INDEPENDENT_AMBULATORY_CARE_PROVIDER_SITE_OTHER): Payer: Medicare Other | Admitting: *Deleted

## 2020-06-05 DIAGNOSIS — E538 Deficiency of other specified B group vitamins: Secondary | ICD-10-CM

## 2020-06-05 MED ORDER — CYANOCOBALAMIN 1000 MCG/ML IJ SOLN
1000.0000 ug | INTRAMUSCULAR | Status: DC
  Administered 2020-06-05 – 2022-10-01 (×27): 1000 ug via INTRAMUSCULAR

## 2020-06-09 ENCOUNTER — Other Ambulatory Visit: Payer: Self-pay | Admitting: Family Medicine

## 2020-06-13 ENCOUNTER — Other Ambulatory Visit (HOSPITAL_COMMUNITY): Payer: Self-pay

## 2020-06-13 DIAGNOSIS — D500 Iron deficiency anemia secondary to blood loss (chronic): Secondary | ICD-10-CM

## 2020-06-16 ENCOUNTER — Inpatient Hospital Stay (HOSPITAL_COMMUNITY): Payer: Medicare Other | Attending: Hematology

## 2020-06-16 ENCOUNTER — Other Ambulatory Visit: Payer: Self-pay

## 2020-06-16 DIAGNOSIS — D509 Iron deficiency anemia, unspecified: Secondary | ICD-10-CM | POA: Insufficient documentation

## 2020-06-16 DIAGNOSIS — E538 Deficiency of other specified B group vitamins: Secondary | ICD-10-CM | POA: Diagnosis not present

## 2020-06-16 DIAGNOSIS — D500 Iron deficiency anemia secondary to blood loss (chronic): Secondary | ICD-10-CM

## 2020-06-16 LAB — COMPREHENSIVE METABOLIC PANEL
ALT: 35 U/L (ref 0–44)
AST: 43 U/L — ABNORMAL HIGH (ref 15–41)
Albumin: 3.7 g/dL (ref 3.5–5.0)
Alkaline Phosphatase: 85 U/L (ref 38–126)
Anion gap: 9 (ref 5–15)
BUN: 12 mg/dL (ref 8–23)
CO2: 27 mmol/L (ref 22–32)
Calcium: 8.8 mg/dL — ABNORMAL LOW (ref 8.9–10.3)
Chloride: 97 mmol/L — ABNORMAL LOW (ref 98–111)
Creatinine, Ser: 0.68 mg/dL (ref 0.44–1.00)
GFR, Estimated: >60 mL/min (ref >60)
Glucose, Bld: 156 mg/dL — ABNORMAL HIGH (ref 70–99)
Potassium: 4.1 mmol/L (ref 3.5–5.1)
Sodium: 133 mmol/L — ABNORMAL LOW (ref 135–145)
Total Bilirubin: 0.8 mg/dL (ref 0.3–1.2)
Total Protein: 6.3 g/dL — ABNORMAL LOW (ref 6.5–8.1)

## 2020-06-16 LAB — CBC WITH DIFFERENTIAL/PLATELET
Abs Immature Granulocytes: 0.01 10*3/uL (ref 0.00–0.07)
Basophils Absolute: 0 10*3/uL (ref 0.0–0.1)
Basophils Relative: 0 %
Eosinophils Absolute: 0 10*3/uL (ref 0.0–0.5)
Eosinophils Relative: 1 %
HCT: 40.1 % (ref 36.0–46.0)
Hemoglobin: 13.8 g/dL (ref 12.0–15.0)
Immature Granulocytes: 0 %
Lymphocytes Relative: 19 %
Lymphs Abs: 0.8 10*3/uL (ref 0.7–4.0)
MCH: 33.9 pg (ref 26.0–34.0)
MCHC: 34.4 g/dL (ref 30.0–36.0)
MCV: 98.5 fL (ref 80.0–100.0)
Monocytes Absolute: 0.4 10*3/uL (ref 0.1–1.0)
Monocytes Relative: 9 %
Neutro Abs: 3 10*3/uL (ref 1.7–7.7)
Neutrophils Relative %: 71 %
Platelets: 148 10*3/uL — ABNORMAL LOW (ref 150–400)
RBC: 4.07 MIL/uL (ref 3.87–5.11)
RDW: 12.1 % (ref 11.5–15.5)
WBC: 4.2 10*3/uL (ref 4.0–10.5)
nRBC: 0 % (ref 0.0–0.2)

## 2020-06-16 LAB — VITAMIN D 25 HYDROXY (VIT D DEFICIENCY, FRACTURES): Vit D, 25-Hydroxy: 44.61 ng/mL (ref 30–100)

## 2020-06-16 LAB — FERRITIN: Ferritin: 430 ng/mL — ABNORMAL HIGH (ref 11–307)

## 2020-06-16 LAB — IRON AND TIBC
Iron: 78 ug/dL (ref 28–170)
Saturation Ratios: 29 % (ref 10.4–31.8)
TIBC: 265 ug/dL (ref 250–450)
UIBC: 187 ug/dL

## 2020-06-16 LAB — VITAMIN B12: Vitamin B-12: 494 pg/mL (ref 180–914)

## 2020-06-16 LAB — LACTATE DEHYDROGENASE: LDH: 148 U/L (ref 98–192)

## 2020-06-23 ENCOUNTER — Inpatient Hospital Stay (HOSPITAL_BASED_OUTPATIENT_CLINIC_OR_DEPARTMENT_OTHER): Payer: Medicare Other | Admitting: Hematology

## 2020-06-23 ENCOUNTER — Other Ambulatory Visit: Payer: Self-pay

## 2020-06-23 ENCOUNTER — Ambulatory Visit (INDEPENDENT_AMBULATORY_CARE_PROVIDER_SITE_OTHER): Payer: Medicare Other | Admitting: *Deleted

## 2020-06-23 VITALS — BP 160/55 | HR 72 | Temp 96.8°F | Resp 18 | Wt 205.1 lb

## 2020-06-23 DIAGNOSIS — D500 Iron deficiency anemia secondary to blood loss (chronic): Secondary | ICD-10-CM | POA: Diagnosis not present

## 2020-06-23 DIAGNOSIS — Z0000 Encounter for general adult medical examination without abnormal findings: Secondary | ICD-10-CM

## 2020-06-23 DIAGNOSIS — E538 Deficiency of other specified B group vitamins: Secondary | ICD-10-CM | POA: Diagnosis not present

## 2020-06-23 DIAGNOSIS — D509 Iron deficiency anemia, unspecified: Secondary | ICD-10-CM | POA: Diagnosis not present

## 2020-06-23 NOTE — Patient Instructions (Signed)
[facility] at [facility] °Discharge Instructions ° °You were seen today by Dr. Katragadda. He went over your recent results. Continue getting your vitamin B12 injections every month. Dr. Katragadda will see you back in 6 months for labs and follow up. ° ° °Thank you for choosing [facility] at [facility] to provide your oncology and hematology care.  To afford each patient quality time with our provider, please arrive at least 15 minutes before your scheduled appointment time.  ° °If you have a lab appointment with the Cancer Center please come in thru the Main Entrance and check in at the main information desk ° °You need to re-schedule your appointment should you arrive 10 or more minutes late.  We strive to give you quality time with our providers, and arriving late affects you and other patients whose appointments are after yours.  Also, if you no show three or more times for appointments you may be dismissed from the clinic at the providers discretion.     °Again, thank you for choosing [facility].  Our hope is that these requests will decrease the amount of time that you wait before being seen by our physicians.       °_____________________________________________________________ ° °Should you have questions after your visit to [facility], please contact our office at [phone] between the hours of 8:00 a.m. and 4:30 p.m.  Voicemails left after 4:00 p.m. will not be returned until the following business day.  For prescription refill requests, have your pharmacy contact our office and allow 72 hours.   ° °Cancer Center Support Programs:  ° °> Cancer Support Group  °2nd Tuesday of the month 1pm-2pm, Journey Room  ° ° °

## 2020-06-23 NOTE — Progress Notes (Addendum)
MEDICARE ANNUAL WELLNESS VISIT  06/23/2020  Telephone Visit Disclaimer This Medicare AWV was conducted by telephone due to national recommendations for restrictions regarding the COVID-19 Pandemic (e.g. social distancing).  I verified, using two identifiers, that I am speaking with Jenna [name] or their authorized healthcare agent. I discussed the limitations, risks, security, and privacy concerns of performing an evaluation and management service by telephone and the potential availability of an in-person appointment in the future. The patient expressed understanding and agreed to proceed.  Location of Patient: Home Location of Provider (nurse):  [facility]  Subjective:    Jenna [name] is a 85 y.o. female patient of Dettinger, Fransisca Kaufmann, MD who had a Medicare Annual Wellness Visit today via telephone. Jenna [name] is Retired and lives with their spouse. she has 1 son living and 1 daughter that passed away at birth. She reports that she is socially active and does interact with friends/family regularly. she is minimally physically active and enjoys knitting, coloring, and word search puzzles, .  Patient Care Team: Dettinger, Fransisca Kaufmann, MD as PCP - General (Family Medicine) Satira Sark, MD as PCP - Cardiology (Cardiology) Rogene Houston, MD as Consulting Physician (Gastroenterology) Satira Sark, MD as Consulting Physician (Cardiology) Gaynelle Arabian, MD as Consulting Physician (Orthopedic Surgery) Ilean China, RN as Registered Nurse Leticia Clas, [place] (Optometry)  Advanced Directives 06/23/2020 02/15/2020 11/22/2019 11/13/2019 02/08/2019 11/06/2018 09/24/2018  Does Patient Have a Medical Advance Directive? No No No No No No No  Would patient like information on creating a medical advance directive? No - Patient declined No - Patient declined No - Patient declined No - Patient declined No - Patient declined No - Patient declined No - Guardian declined   Pre-existing out of facility DNR order (yellow form or pink MOST form) - - - - - - -    Hospital Utilization Over the Past 12 Months: # of hospitalizations or ER visits: 0 # of surgeries: 0  Review of Systems    Patient reports that her overall health is unchanged compared to last year.  History obtained from chart review and the patient  Patient Reported Readings (BP, Pulse, CBG, Weight, etc) none  Pain Assessment Pain : No/denies pain     Current Medications & Allergies (verified) Allergies as of 06/23/2020      Reactions   Daypro [oxaprozin]    Unknown reaction   Diclofenac Sodium    Unknown reaction   Effexor [venlafaxine Hydrochloride]    Unknown reaction   Lidocaine-prilocaine    Prozac [fluoxetine Hcl]    Unknown reaction   Amitriptyline Rash   Cymbalta [duloxetine Hcl] Nausea Only   Duloxetine Nausea Only   Lipitor [atorvastatin Calcium] Other (See Comments)   Leg aches   Penicillins Rash   Has patient had a PCN reaction causing immediate rash, facial/tongue/throat swelling, SOB or lightheadedness with hypotension: Yes Has patient had a PCN reaction causing severe rash involving mucus membranes or skin necrosis: No Has patient had a PCN reaction that required hospitalization: No Has patient had a PCN reaction occurring within the last 10 years: No If all of the above answers are "NO", then may proceed with Cephalosporin use.   Savella [milnacipran Hcl] Rash   Sulfa Antibiotics Rash   Zocor [simvastatin] Other (See Comments)   Hip pain      Medication List       Accurate as of June 23, 2020 11:16 AM. If you have  any questions, ask your nurse or doctor.        acetaminophen 500 MG tablet Commonly known as: TYLENOL Take 500 mg by mouth daily as needed for moderate pain or headache.   colchicine 0.6 MG tablet TAKE 1 TABLET DAILY AS NEEDED FOR GOUT FLARE UP   furosemide 40 MG tablet Commonly known as: LASIX Take 40 mg by mouth as needed.    hydrocortisone 2.5 % rectal cream Commonly known as: ANUSOL-HC APPLY RECTALLY DAILY AS NEEDED FOR HEMMORHOIDS OR ITCHING.   levothyroxine 75 MCG tablet Commonly known as: SYNTHROID Take 1 tablet (75 mcg total) by mouth daily.   metoprolol succinate 50 MG 24 hr tablet Commonly known as: TOPROL-XL TAKE 1 TABLET DAILY WITH OR IMMEDIATELY FOLLOWING A MEAL.   nitroGLYCERIN 0.4 MG SL tablet Commonly known as: NITROSTAT Place 1 tablet (0.4 mg total) under the tongue every 5 (five) minutes x 3 doses as needed for chest pain.   pantoprazole 40 MG tablet Commonly known as: PROTONIX TAKE 1 TABLET BY MOUTH DAILY BEFORE BREAKFAST.   rivaroxaban 20 MG Tabs tablet Commonly known as: Xarelto TAKE 1 TABLET BY MOUTH DAILY WITH SUPPER.   Vitamin D3 50 MCG (2000 UT) Tabs Take 2,000 Units by mouth daily.       History (reviewed): Past Medical History:  Diagnosis Date  . Cataract   . DDD (degenerative disc disease)   . Fibromyalgia   . Herpes zoster   . History of skin cancer   . Hyperlipidemia   . Hypertension   . Hypothyroidism   . Iron deficiency anemia due to chronic blood loss 04/06/2018  . OA (osteoarthritis)   . OSA on CPAP 01/16/2018   Mild OSA overall with an AHI of 5.8/h but during REM sleep moderate with an AHI of 28/h.  Oxygen saturations dropped to 76% during respiratory events.  Now on CPAP at 9 cm H2O   . Osteoporosis   . Palpitations   . Pre-diabetes   . PUD (peptic ulcer disease)   . Skin cancer of nose    [name]   Past Surgical History:  Procedure Laterality Date  . ABDOMINAL HYSTERECTOMY     partial  . BIOPSY  04/24/2018   Procedure: BIOPSY;  Surgeon: Rogene Houston, MD;  Location: [facility];  Service: Endoscopy;;  gastric body ulcerated surface  . BREAST BIOPSY     Right  . CATARACT EXTRACTION W/PHACO  10/28/2011   Procedure: CATARACT EXTRACTION PHACO AND INTRAOCULAR LENS PLACEMENT (IOC);  Surgeon: Tonny Branch, MD;  Location: [facility];  Service:  Ophthalmology;  Laterality: Right;  CDE 18.38  . CATARACT EXTRACTION W/PHACO  02/14/2012   Procedure: CATARACT EXTRACTION PHACO AND INTRAOCULAR LENS PLACEMENT (IOC);  Surgeon: Tonny Branch, MD;  Location: [facility];  Service: Ophthalmology;  Laterality: Left;  CDE 17.20  . CESAREAN SECTION    . COLONOSCOPY N/A 05/31/2013   Procedure: COLONOSCOPY;  Surgeon: Rogene Houston, MD;  [facility];  Service: Endoscopy;  Laterality: N/A;  240  . COLONOSCOPY N/A 04/24/2018   Procedure: COLONOSCOPY;  Surgeon: Rogene Houston, MD;  [facility];  Service: Endoscopy;  Laterality: N/A;  9:30  . ESOPHAGOGASTRODUODENOSCOPY N/A 04/24/2018   Procedure: ESOPHAGOGASTRODUODENOSCOPY (EGD);  Surgeon: Rogene Houston, MD;  [facility];  Service: Endoscopy;  Laterality: N/A;  . EYE SURGERY     skin removal   . FRACTURE SURGERY  1952   fractured right arm  . LEFT  HEART CATH AND CORONARY ANGIOGRAPHY N/A 09/25/2018   Procedure: LEFT HEART CATH AND CORONARY ANGIOGRAPHY;  Surgeon: Jettie Booze, MD;  Location: [facility];  Service: Cardiovascular;  Laterality: N/A;   Family History  Problem Relation Age of Onset  . CVA Mother   . Stroke Mother   . Osteoporosis Mother   . Cancer Father        prostate  . Bronchitis Sister   . COPD Sister   . Early death Brother        died in [place]  . Carpal tunnel syndrome Sister   . Cancer Brother        throat  . Cancer Brother        lung  . Cancer Brother        throat  . Stroke Brother   . Heart attack Brother   . Gout Brother   . Heart Problems Brother        stents  . CAD Brother   . Hypertension Brother   . Gout Brother   . Arthritis Brother   . Diabetes Son   . Coronary artery disease Neg Hx        No premature  . Anesthesia problems Neg Hx   . Hypotension Neg Hx   . Malignant hyperthermia Neg Hx   . Pseudochol deficiency Neg Hx    Social History   Socioeconomic History  . Marital status: Married    Spouse  name: [name]  . Number of children: 1  . Years of education: 70  . Highest education level: 9th grade  Occupational History  . Occupation: Retired    Comment: Therapist, art  Tobacco Use  . Smoking status: Never Smoker  . Smokeless tobacco: Never Used  Vaping Use  . Vaping Use: Never used  Substance and Sexual Activity  . Alcohol use: No  . Drug use: No  . Sexual activity: Not Currently    Birth control/protection: Surgical  Other Topics Concern  . Not on file  Social History Narrative   Married   Lives in a one story home    Social Determinants of Health   Financial Resource Strain: Not on file  Food Insecurity: Not on file  Transportation Needs: Not on file  Physical Activity: Not on file  Stress: Not on file  Social Connections: Not on file    Activities of Daily Living In your present state of health, do you have any difficulty performing the following activities: 06/23/2020  Hearing? N  Vision? N  Comment Wears glasses  Difficulty concentrating or making decisions? N  Walking or climbing stairs? Y  Comment Patient does not climb stairs due to knees  Dressing or bathing? N  Doing errands, shopping? N  Preparing Food and eating ? N  Using the Toilet? N  In the past six months, have you accidently leaked urine? N  Do you have problems with loss of bowel control? N  Managing your Medications? N  Managing your Finances? N  Housekeeping or managing your Housekeeping? N  Some recent data might be hidden    Patient Education/ Literacy How often do you need to have someone help you when you read instructions, pamphlets, or other written materials from your doctor or pharmacy?: 1 - Never What is the last grade level you completed in school?: 9th  Exercise Current Exercise Habits: The patient does not participate in regular exercise at present, Exercise limited by: orthopedic condition(s)  Diet  Patient reports consuming 2 meals a day and 0  snack(s) a day Patient reports that her primary diet is: Regular and low fat Patient reports that she does have regular access to food.   Depression Screen PHQ 2/9 Scores 06/23/2020 05/22/2020 02/21/2020 11/21/2019 08/20/2019 02/08/2019 01/22/2019  PHQ - 2 Score 0 0 0 0 0 1 0  PHQ- 9 Score - - - - - - -     Fall Risk Fall Risk  06/23/2020 05/22/2020 02/21/2020 11/21/2019 08/20/2019  Falls in the past year? 0 0 0 0 0  Number falls in past yr: - - - - -  Injury with Fall? - - - - -  Comment - - - - -  Risk for fall due to : - - - - -  Follow up - - - - -  Comment - - - - -     Objective:  Jenna [name] seemed alert and oriented and she participated appropriately during our telephone visit.  Blood Pressure Weight BMI  BP Readings from Last 3 Encounters:  05/22/20 136/77  04/28/20 (!) 168/76  03/07/20 (!) 142/44   Wt Readings from Last 3 Encounters:  05/22/20 206 lb (93.4 kg)  04/28/20 210 lb 3.2 oz (95.3 kg)  02/21/20 208 lb (94.3 kg)   BMI Readings from Last 1 Encounters:  05/22/20 33.25 kg/m    *Unable to obtain current vital signs, weight, and BMI due to telephone visit type  Hearing/Vision  . Jniyah did not seem to have difficulty with hearing/understanding during the telephone conversation . Reports that she has had a formal eye exam by an eye care professional within the past year . Reports that she has not had a formal hearing evaluation within the past year *Unable to fully assess hearing and vision during telephone visit type  Cognitive Function: 6CIT Screen 06/23/2020 02/08/2019  What Year? 0 points 0 points  What month? 0 points 0 points  What time? 0 points 0 points  Count back from 20 0 points 0 points  Months in reverse 2 points 2 points  Repeat phrase 0 points 2 points  Total Score 2 4   (Normal:0-7, Significant for Dysfunction: >8)  Normal Cognitive Function Screening: Yes   Immunization & Health Maintenance Record Immunization History  Administered  Date(s) Administered  . Pneumococcal Conjugate-13 02/21/2013  . Pneumococcal Polysaccharide-23 05/24/2006  . Td 09/21/2004  . Zoster Recombinat (Shingrix) 01/22/2019, 03/27/2019    Health Maintenance  Topic Date Due  . FOOT EXAM  Never done  . URINE MICROALBUMIN  Never done  . OPHTHALMOLOGY EXAM  05/24/2010  . TETANUS/TDAP  02/20/2021 (Originally 09/22/2014)  . INFLUENZA VACCINE  03/19/2021 (Originally 12/23/2019)  . HEMOGLOBIN A1C  11/20/2020  . DEXA SCAN  02/24/2022  . PNA vac Low Risk Adult  Completed  . MAMMOGRAM  Discontinued  . COVID-19 Vaccine  Discontinued       Assessment  This is a routine wellness examination for Jenna [name].  Health Maintenance: Due or Overdue Health Maintenance Due  Topic Date Due  . FOOT EXAM  Never done  . URINE MICROALBUMIN  Never done  . OPHTHALMOLOGY EXAM  05/24/2010    Jenna [name] does not need a referral for Community Assistance: Care Management:   no Social Work:    no Prescription Assistance:  no Nutrition/Diabetes Education:  no   Plan:  Personalized Goals Goals Addressed            This Visit's Progress   .  AWV       06/23/2020 AWV Goal: Fall Prevention  . Over the next year, patient will decrease their risk for falls by: o Using assistive devices, such as a cane or walker, as needed o Identifying fall risks within their home and correcting them by: - Removing throw rugs - Adding handrails to stairs or ramps - Removing clutter and keeping a clear pathway throughout the home - Increasing light, especially at night - Adding shower handles/bars - Raising toilet seat o Identifying potential personal risk factors for falls: - Medication side effects - Incontinence/urgency - Vestibular dysfunction - Hearing loss - Musculoskeletal disorders - Neurological disorders - Orthostatic hypotension        Personalized Health Maintenance & Screening Recommendations  Urine Microalbumin and foot exam  Lung Cancer  Screening Recommended: no (Low Dose CT Chest recommended if Age 60-80 years, 30 pack-year currently smoking OR have quit w/in past 15 years) Hepatitis C Screening recommended: no HIV Screening recommended: no  Advanced Directives: Written information was not prepared per patient's request.  Referrals & Orders No orders of the defined types were placed in this encounter.   Follow-up Plan . Follow-up with Dettinger, Fransisca Kaufmann, MD as planned . At your next visit we will obtain a urine microalbumin and Dr.Dettinger will complete a foot exam.  . AVS printed and mailed to patient   I have personally reviewed and noted the following in the patient's chart:   . Medical and social history . Use of alcohol, tobacco or illicit drugs  . Current medications and supplements . Functional ability and status . Nutritional status . Physical activity . Advanced directives . List of other physicians . Hospitalizations, surgeries, and ER visits in previous 12 months . Vitals . Screenings to include cognitive, depression, and falls . Referrals and appointments  In addition, I have reviewed and discussed with Jenna [name] certain preventive protocols, quality metrics, and best practice recommendations. A written personalized care plan for preventive services as well as general preventive health recommendations is available and can be mailed to the patient at her request.     Lynnea Ferrier, LPN  [id number]

## 2020-06-23 NOTE — Patient Instructions (Signed)
  [place] Maintenance Summary and Written Plan of Care  Jenna [name] ,  Thank you for allowing me to perform your Medicare Annual Wellness Visit and for your ongoing commitment to your health.   Health Maintenance & Immunization History Health Maintenance  Topic Date Due  . FOOT EXAM  Never done  . URINE MICROALBUMIN  Never done  . OPHTHALMOLOGY EXAM  05/24/2010  . TETANUS/TDAP  02/20/2021 (Originally 09/22/2014)  . INFLUENZA VACCINE  03/19/2021 (Originally 12/23/2019)  . HEMOGLOBIN A1C  11/20/2020  . DEXA SCAN  02/24/2022  . PNA vac Low Risk Adult  Completed  . MAMMOGRAM  Discontinued  . COVID-19 Vaccine  Discontinued   Immunization History  Administered Date(s) Administered  . Pneumococcal Conjugate-13 02/21/2013  . Pneumococcal Polysaccharide-23 05/24/2006  . Td 09/21/2004  . Zoster Recombinat (Shingrix) 01/22/2019, 03/27/2019    These are the patient goals that we discussed: Goals Addressed            This Visit's Progress   . AWV       06/23/2020 AWV Goal: Fall Prevention  . Over the next year, patient will decrease their risk for falls by: o Using assistive devices, such as a cane or walker, as needed o Identifying fall risks within their home and correcting them by: - Removing throw rugs - Adding handrails to stairs or ramps - Removing clutter and keeping a clear pathway throughout the home - Increasing light, especially at night - Adding shower handles/bars - Raising toilet seat o Identifying potential personal risk factors for falls: - Medication side effects - Incontinence/urgency - Vestibular dysfunction - Hearing loss - Musculoskeletal disorders - Neurological disorders - Orthostatic hypotension          This is a list of Health Maintenance Items that are overdue or due now: Health Maintenance Due  Topic Date Due  . FOOT EXAM  Never done  . URINE MICROALBUMIN  Never done  . OPHTHALMOLOGY EXAM  05/24/2010      Orders/Referrals Placed Today: No orders of the defined types were placed in this encounter.  (Contact our referral department at [phone] if you have not spoken with someone about your referral appointment within the next 5 days)    Follow-up Plan . Follow-up with Dettinger, Fransisca Kaufmann, MD as planned . At your next visit we will obtain a urine microalbumin and Dr.Dettinger will complete a foot exam.

## 2020-06-23 NOTE — Progress Notes (Signed)
[address] [place], [place] [address]   CLINIC:  Medical Oncology/Hematology  PCP:  Dettinger, Fransisca Kaufmann, MD [place] / [place] [address]  [phone]  REASON FOR VISIT:  Follow-up for IDA  PRIOR THERAPY: None  CURRENT THERAPY: Intermittent Feraheme last on 03/07/2020  INTERVAL HISTORY:  [name], a 85 y.o. female, returns for routine follow-up for her IDA. Jenna [name] was last seen by Francene Finders, NP, on 02/15/2020.  Today she reports feeling okay. She continues getting vitamin B12 injections monthly. She continues having lower energy levels. She denies having recent infections, F/C or weight loss, though she gets occasional hot flashes. She reports having leg swelling.   REVIEW OF SYSTEMS:  Review of Systems  Constitutional: Positive for appetite change (25%) and fatigue (25%). Negative for chills, fever and unexpected weight change.  Respiratory: Positive for shortness of breath (w/ exertion).   Endocrine: Positive for hot flashes.  Neurological: Positive for numbness (tingling in hands & feet).  All other systems reviewed and are negative.   PAST MEDICAL/SURGICAL HISTORY:  Past Medical History:  Diagnosis Date  . Cataract   . DDD (degenerative disc disease)   . Fibromyalgia   . Herpes zoster   . History of skin cancer   . Hyperlipidemia   . Hypertension   . Hypothyroidism   . Iron deficiency anemia due to chronic blood loss 04/06/2018  . OA (osteoarthritis)   . OSA on CPAP 01/16/2018   Mild OSA overall with an AHI of 5.8/h but during REM sleep moderate with an AHI of 28/h.  Oxygen saturations dropped to 76% during respiratory events.  Now on CPAP at 9 cm H2O   . Osteoporosis   . Palpitations   . Pre-diabetes   . PUD (peptic ulcer disease)   . Skin cancer of nose    [name]   Past Surgical History:  Procedure Laterality Date  . ABDOMINAL HYSTERECTOMY     partial  . BIOPSY  04/24/2018   Procedure: BIOPSY;   Surgeon: Rogene Houston, MD;  [facility]: [facility];  Service: Endoscopy;;  gastric body ulcerated surface  . BREAST BIOPSY     Right  . CATARACT EXTRACTION W/PHACO  10/28/2011   Procedure: CATARACT EXTRACTION PHACO AND INTRAOCULAR LENS PLACEMENT (IOC);  Surgeon: Tonny Branch, MD;  Location: [facility];  Service: Ophthalmology;  Laterality: Right;  CDE 18.38  . CATARACT EXTRACTION W/PHACO  02/14/2012   Procedure: CATARACT EXTRACTION PHACO AND INTRAOCULAR LENS PLACEMENT (IOC);  Surgeon: Tonny Branch, MD;  Location: [facility];  Service: Ophthalmology;  Laterality: Left;  CDE 17.20  . CESAREAN SECTION    . COLONOSCOPY N/A 05/31/2013   Procedure: COLONOSCOPY;  Surgeon: Rogene Houston, MD;  [facility]: [facility];  Service: Endoscopy;  Laterality: N/A;  240  . COLONOSCOPY N/A 04/24/2018   Procedure: COLONOSCOPY;  Surgeon: Rogene Houston, MD;  [facility];  Service: Endoscopy;  Laterality: N/A;  9:30  . ESOPHAGOGASTRODUODENOSCOPY N/A 04/24/2018   Procedure: ESOPHAGOGASTRODUODENOSCOPY (EGD);  Surgeon: Rogene Houston, MD;  [facility]: [facility];  Service: Endoscopy;  Laterality: N/A;  . EYE SURGERY     skin removal   . FRACTURE SURGERY  1952   fractured right arm  . LEFT HEART CATH AND CORONARY ANGIOGRAPHY N/A 09/25/2018   Procedure: LEFT HEART CATH AND CORONARY ANGIOGRAPHY;  Surgeon: Jettie Booze, MD;  Location: [facility];  Service: Cardiovascular;  Laterality:  N/A;    SOCIAL HISTORY:  Social History   Socioeconomic History  . Marital status: Married    Spouse name: [name]  . Number of children: 1  . Years of education: 61  . Highest education level: 9th grade  Occupational History  . Occupation: Retired    Comment: Therapist, art  Tobacco Use  . Smoking status: Never Smoker  . Smokeless tobacco: Never Used  Vaping Use  . Vaping Use: Never used  Substance and Sexual Activity  . Alcohol use: No  . Drug use: No  . Sexual activity: Not Currently     Birth control/protection: Surgical  Other Topics Concern  . Not on file  Social History Narrative   Married   Lives in a one story home    Social Determinants of Health   Financial Resource Strain: Not on file  Food Insecurity: Not on file  Transportation Needs: Not on file  Physical Activity: Not on file  Stress: Not on file  Social Connections: Not on file  Intimate Partner Violence: Not on file    FAMILY HISTORY:  Family History  Problem Relation Age of Onset  . CVA Mother   . Stroke Mother   . Osteoporosis Mother   . Cancer Father        prostate  . Bronchitis Sister   . COPD Sister   . Early death Brother        died in [place]  . Carpal tunnel syndrome Sister   . Cancer Brother        throat  . Cancer Brother        lung  . Cancer Brother        throat  . Stroke Brother   . Heart attack Brother   . Gout Brother   . Heart Problems Brother        stents  . CAD Brother   . Hypertension Brother   . Gout Brother   . Arthritis Brother   . Diabetes Son   . Coronary artery disease Neg Hx        No premature  . Anesthesia problems Neg Hx   . Hypotension Neg Hx   . Malignant hyperthermia Neg Hx   . Pseudochol deficiency Neg Hx     CURRENT MEDICATIONS:  Current Outpatient Medications  Medication Sig Dispense Refill  . acetaminophen (TYLENOL) 500 MG tablet Take 500 mg by mouth daily as needed for moderate pain or headache.     . Cholecalciferol (VITAMIN D3) 2000 UNITS TABS Take 2,000 Units by mouth daily.    . colchicine 0.6 MG tablet TAKE 1 TABLET DAILY AS NEEDED FOR GOUT FLARE UP 30 tablet 0  . furosemide (LASIX) 40 MG tablet Take 40 mg by mouth as needed.     . hydrocortisone (ANUSOL-HC) 2.5 % rectal cream APPLY RECTALLY DAILY AS NEEDED FOR HEMMORHOIDS OR ITCHING. 30 g 0  . levothyroxine (SYNTHROID) 75 MCG tablet Take 1 tablet (75 mcg total) by mouth daily. 90 tablet 3  . metoprolol succinate (TOPROL-XL) 50 MG 24 hr tablet TAKE 1 TABLET DAILY WITH OR  IMMEDIATELY FOLLOWING A MEAL. 90 tablet 3  . nitroGLYCERIN (NITROSTAT) 0.4 MG SL tablet Place 1 tablet (0.4 mg total) under the tongue every 5 (five) minutes x 3 doses as needed for chest pain. 30 tablet 0  . pantoprazole (PROTONIX) 40 MG tablet TAKE 1 TABLET BY MOUTH DAILY BEFORE BREAKFAST. 90 tablet 3  . rivaroxaban (XARELTO) 20 MG TABS  tablet TAKE 1 TABLET BY MOUTH DAILY WITH SUPPER. 90 tablet 3   Current Facility-Administered Medications  Medication Dose Route Frequency Provider Last Rate Last Admin  . cyanocobalamin ((VITAMIN B-12)) injection 1,000 mcg  1,000 mcg Intramuscular Q30 days Dettinger, Fransisca Kaufmann, MD   1,000 mcg at 06/05/20 1447    ALLERGIES:  Allergies  Allergen Reactions  . Daypro [Oxaprozin]     Unknown reaction  . Diclofenac Sodium     Unknown reaction  . Effexor [Venlafaxine Hydrochloride]     Unknown reaction  . Lidocaine-Prilocaine   . Prozac [Fluoxetine Hcl]     Unknown reaction  . Amitriptyline Rash  . Cymbalta [Duloxetine Hcl] Nausea Only  . Duloxetine Nausea Only  . Lipitor [Atorvastatin Calcium] Other (See Comments)    Leg aches  . Penicillins Rash    Has patient had a PCN reaction causing immediate rash, facial/tongue/throat swelling, SOB or lightheadedness with hypotension: Yes Has patient had a PCN reaction causing severe rash involving mucus membranes or skin necrosis: No Has patient had a PCN reaction that required hospitalization: No Has patient had a PCN reaction occurring within the last 10 years: No If all of the above answers are "NO", then may proceed with Cephalosporin use.   [name] [Milnacipran Hcl] Rash  . Sulfa Antibiotics Rash  . Zocor [Simvastatin] Other (See Comments)    Hip pain    PHYSICAL EXAM:  Performance status (ECOG): 1 - Symptomatic but completely ambulatory  Vitals:   06/23/20 1522  BP: (!) 160/55  Pulse: 72  Resp: 18  Temp: (!) 96.8 F (36 C)  SpO2: 94%   Wt Readings from Last 3 Encounters:  06/23/20 205 lb  1.6 oz (93 kg)  05/22/20 206 lb (93.4 kg)  04/28/20 210 lb 3.2 oz (95.3 kg)   Physical Exam Vitals reviewed.  Constitutional:      Appearance: Normal appearance. She is obese.  Cardiovascular:     Rate and Rhythm: Normal rate and regular rhythm.     Pulses: Normal pulses.     Heart sounds: Normal heart sounds.  Pulmonary:     Effort: Pulmonary effort is normal.     Breath sounds: Normal breath sounds.  Chest:  Breasts:     Right: No axillary adenopathy or supraclavicular adenopathy.     Left: No axillary adenopathy or supraclavicular adenopathy.    Musculoskeletal:     Right lower leg: Edema (trace) present.     Left lower leg: Edema (trace) present.  Lymphadenopathy:     Cervical: No cervical adenopathy.     Upper Body:     Right upper body: No supraclavicular, axillary or pectoral adenopathy.     Left upper body: No supraclavicular, axillary or pectoral adenopathy.  Neurological:     General: No focal deficit present.     Mental Status: She is alert and oriented to person, place, and time.  Psychiatric:        Mood and Affect: Mood normal.        Behavior: Behavior normal.     LABORATORY DATA:  I have reviewed the labs as listed.  CBC Latest Ref Rng & Units 06/16/2020 02/08/2020 12/27/2019  WBC 4.0 - 10.5 K/uL 4.2 5.8 4.6  Hemoglobin 12.0 - 15.0 g/dL 13.8 12.9 12.3  Hematocrit 36.0 - 46.0 % 40.1 39.6 39.0  Platelets 150 - 400 K/uL 148(L) 184 198   CMP Latest Ref Rng & Units 06/16/2020 05/22/2020 11/05/2019  Glucose 70 - 99 mg/dL 156(H) 97  114(H)  BUN 8 - 23 mg/dL 12 11 10   Creatinine 0.44 - 1.00 mg/dL 0.68 0.61 0.78  Sodium 135 - 145 mmol/L 133(L) 140 132(L)  Potassium 3.5 - 5.1 mmol/L 4.1 4.1 4.2  Chloride 98 - 111 mmol/L 97(L) 99 98  CO2 22 - 32 mmol/L 27 26 27   Calcium 8.9 - 10.3 mg/dL 8.8(L) 9.0 8.4(L)  Total Protein 6.5 - 8.1 g/dL 6.3(L) - 6.1(L)  Total Bilirubin 0.3 - 1.2 mg/dL 0.8 - 0.6  Alkaline Phos 38 - 126 U/L 85 - 84  AST 15 - 41 U/L 43(H) - 14(L)   ALT 0 - 44 U/L 35 - 12      Component Value Date/Time   RBC 4.07 06/16/2020 1231   MCV 98.5 06/16/2020 1231   MCV 84 08/20/2019 1151   MCH 33.9 06/16/2020 1231   MCHC 34.4 06/16/2020 1231   RDW 12.1 06/16/2020 1231   RDW 12.8 08/20/2019 1151   LYMPHSABS 0.8 06/16/2020 1231   LYMPHSABS 1.3 08/20/2019 1151   MONOABS 0.4 06/16/2020 1231   EOSABS 0.0 06/16/2020 1231   EOSABS 0.1 08/20/2019 1151   BASOSABS 0.0 06/16/2020 1231   BASOSABS 0.0 08/20/2019 1151   Lab Results  Component Value Date   VD25OH 44.61 06/16/2020   VD25OH 53.1 08/09/2018   VD25OH 37.7 03/15/2018   Lab Results  Component Value Date   LDH 148 06/16/2020   LDH 125 10/30/2018   LDH 141 05/05/2018   Lab Results  Component Value Date   TIBC 265 06/16/2020   TIBC 325 02/08/2020   TIBC 297 12/27/2019   FERRITIN 430 (H) 06/16/2020   FERRITIN 37 02/08/2020   FERRITIN 73 12/27/2019   IRONPCTSAT 29 06/16/2020   IRONPCTSAT 20 02/08/2020   IRONPCTSAT 25 12/27/2019    DIAGNOSTIC IMAGING:  I have independently reviewed the scans and discussed with the patient. No results found.   ASSESSMENT:  1.  Iron deficiency anemia: -Initial evaluation on 03/15/2018 showed hemoglobin 8.9, platelets 267, ferritin 12 -She was last treated with IV iron on 11/15/2019 and 11/22/2019 -Patient had a EGD and colonoscopy on 04/24/2018 which was negative for any malignancy.   PLAN:  1.  Iron deficiency anemia: -Last Feraheme on 02/29/2020 on 03/07/2020. -She denies any bleeding per rectum or melena.  She continues to have low energy levels. -Ferritin is 430, percent saturation is 29.  Hemoglobin was normal at 13.8. -No indication for parenteral iron therapy. -RTC 6 months for follow-up with repeat labs.  2.  Vitamin B12 deficiency: -Continue B12 injections monthly.  B12 level is 494.   Orders placed this encounter:  Orders Placed This Encounter  Procedures  . CBC with Differential/Platelet  . Ferritin  . Iron and TIBC   . Vitamin B12     Derek Jack, MD [place] [phone]   I, Milinda Antis, am acting as a scribe for Dr. Sanda Linger.  I, Derek Jack MD, have reviewed the above documentation for accuracy and completeness, and I agree with the above.

## 2020-07-07 ENCOUNTER — Other Ambulatory Visit: Payer: Self-pay

## 2020-07-07 ENCOUNTER — Ambulatory Visit (INDEPENDENT_AMBULATORY_CARE_PROVIDER_SITE_OTHER): Payer: Medicare Other

## 2020-07-07 DIAGNOSIS — E538 Deficiency of other specified B group vitamins: Secondary | ICD-10-CM

## 2020-07-07 NOTE — Progress Notes (Signed)
Cyanocobalamin injection given to right deltoid.  Patient tolerated well. 

## 2020-08-05 ENCOUNTER — Ambulatory Visit (INDEPENDENT_AMBULATORY_CARE_PROVIDER_SITE_OTHER): Payer: Medicare Other

## 2020-08-05 ENCOUNTER — Other Ambulatory Visit: Payer: Self-pay

## 2020-08-05 DIAGNOSIS — E538 Deficiency of other specified B group vitamins: Secondary | ICD-10-CM | POA: Diagnosis not present

## 2020-08-05 NOTE — Progress Notes (Signed)
B12 administered in Left deltoid without difficulty.  Next appt scheduled 09/08/20.

## 2020-08-08 ENCOUNTER — Other Ambulatory Visit: Payer: Self-pay | Admitting: Family Medicine

## 2020-08-16 DIAGNOSIS — J029 Acute pharyngitis, unspecified: Secondary | ICD-10-CM | POA: Diagnosis not present

## 2020-08-21 ENCOUNTER — Ambulatory Visit (INDEPENDENT_AMBULATORY_CARE_PROVIDER_SITE_OTHER): Payer: Medicare Other | Admitting: Family Medicine

## 2020-08-21 ENCOUNTER — Other Ambulatory Visit: Payer: Self-pay

## 2020-08-21 ENCOUNTER — Encounter: Payer: Self-pay | Admitting: Family Medicine

## 2020-08-21 VITALS — BP 142/60 | Ht 66.0 in | Wt 208.0 lb

## 2020-08-21 DIAGNOSIS — I10 Essential (primary) hypertension: Secondary | ICD-10-CM

## 2020-08-21 DIAGNOSIS — K219 Gastro-esophageal reflux disease without esophagitis: Secondary | ICD-10-CM

## 2020-08-21 DIAGNOSIS — E039 Hypothyroidism, unspecified: Secondary | ICD-10-CM

## 2020-08-21 DIAGNOSIS — I480 Paroxysmal atrial fibrillation: Secondary | ICD-10-CM

## 2020-08-21 DIAGNOSIS — E782 Mixed hyperlipidemia: Secondary | ICD-10-CM

## 2020-08-21 DIAGNOSIS — E1169 Type 2 diabetes mellitus with other specified complication: Secondary | ICD-10-CM | POA: Diagnosis not present

## 2020-08-21 LAB — BAYER DCA HB A1C WAIVED: HB A1C (BAYER DCA - WAIVED): 6.2 % (ref <7.0)

## 2020-08-21 MED ORDER — PANTOPRAZOLE SODIUM 40 MG PO TBEC: DELAYED_RELEASE_TABLET | ORAL | 3 refills | Status: DC

## 2020-08-21 MED ORDER — LEVOTHYROXINE SODIUM 75 MCG PO TABS: 75.0000 ug | ORAL_TABLET | Freq: Every day | ORAL | 3 refills | Status: DC

## 2020-08-21 NOTE — Progress Notes (Signed)
BP (!) 142/60   Ht 5\' 6"  (1.676 m)   Wt 208 lb (94.3 kg)   SpO2 97%   BMI 33.57 kg/m    Subjective:   Patient ID: Jenna [name], female    DOB: 27-Aug-1935, 85 y.o.   MRN: [id number]  HPI: Jenna [name] is a 85 y.o. female presenting on 08/21/2020 for Medical Management of Chronic Issues, Diabetes, Hyperlipidemia, and Hypothyroidism   HPI Hypothyroidism recheck Patient is coming in for thyroid recheck today as well. They deny any issues with hair changes or heat or cold problems or diarrhea or constipation. They deny any chest pain or palpitations. They are currently on levothyroxine 75 micrograms   Type 2 diabetes mellitus Patient comes in today for recheck of his diabetes. Patient has been currently taking no medication currently has been diet controlled, A1c 6.2 today.. Patient is not currently on an ACE inhibitor/ARB. Patient has not seen an ophthalmologist this year. Patient denies any issues with their feet. The symptom started onset as an adult hyperlipidemia hypertension and hypothyroidism ARE RELATED TO DM   Hyperlipidemia Patient is coming in for recheck of his hyperlipidemia. The patient is currently taking no medication currently has been doing diet controlled, will check levels again today.  She is tried multiple statins before and has some of them listed on her allergy. They deny any issues with myalgias or history of liver damage from it. They deny any focal numbness or weakness or chest pain.   Hypertension Patient is currently on metoprolol and furosemide, and their blood pressure today is 142/60. Patient denies any lightheadedness or dizziness. Patient denies headaches, blurred vision, chest pains, shortness of breath, or weakness. Denies any side effects from medication and is content with current medication.   Patient does have swelling in both of her legs and has tried compression stockings but it did not work well, does have gross my helps with  this.  Relevant past medical, surgical, family and social history reviewed and updated as indicated. Interim medical history since our last visit reviewed. Allergies and medications reviewed and updated.  Review of Systems  Constitutional: Negative for chills and fever.  Eyes: Negative for visual disturbance.  Respiratory: Negative for chest tightness and shortness of breath.   Cardiovascular: Positive for leg swelling. Negative for chest pain.  Genitourinary: Negative for difficulty urinating and dysuria.  Musculoskeletal: Negative for back pain and gait problem.  Skin: Negative for rash.  Neurological: Negative for light-headedness and headaches.  Psychiatric/Behavioral: Negative for agitation and behavioral problems.  All other systems reviewed and are negative.   Per HPI unless specifically indicated above   Allergies as of 08/21/2020      Reactions   Daypro [oxaprozin]    Unknown reaction   Diclofenac Sodium    Unknown reaction   Effexor [venlafaxine Hydrochloride]    Unknown reaction   Lidocaine-prilocaine    Prozac [fluoxetine Hcl]    Unknown reaction   Amitriptyline Rash   Cymbalta [duloxetine Hcl] Nausea Only   Duloxetine Nausea Only   Lipitor [atorvastatin Calcium] Other (See Comments)   Leg aches   Penicillins Rash   Has patient had a PCN reaction causing immediate rash, facial/tongue/throat swelling, SOB or lightheadedness with hypotension: Yes Has patient had a PCN reaction causing severe rash involving mucus membranes or skin necrosis: No Has patient had a PCN reaction that required hospitalization: No Has patient had a PCN reaction occurring within the last 10 years: No If all of the above  answers are "NO", then may proceed with Cephalosporin use.   Savella [milnacipran Hcl] Rash   Sulfa Antibiotics Rash   Zocor [simvastatin] Other (See Comments)   Hip pain      Medication List       Accurate as of August 21, 2020  3:56 PM. If you have any questions,  ask your nurse or doctor.        acetaminophen 500 MG tablet Commonly known as: TYLENOL Take 500 mg by mouth daily as needed for moderate pain or headache.   colchicine 0.6 MG tablet TAKE 1 TABLET DAILY AS NEEDED FOR GOUT FLARE UP   furosemide 40 MG tablet Commonly known as: LASIX Take 40 mg by mouth as needed.   hydrocortisone 2.5 % rectal cream Commonly known as: ANUSOL-HC APPLY RECTALLY DAILY AS NEEDED FOR HEMMORHOIDS OR ITCHING.   levothyroxine 75 MCG tablet Commonly known as: SYNTHROID Take 1 tablet (75 mcg total) by mouth daily.   metoprolol succinate 50 MG 24 hr tablet Commonly known as: TOPROL-XL TAKE 1 TABLET DAILY WITH OR IMMEDIATELY FOLLOWING A MEAL.   nitroGLYCERIN 0.4 MG SL tablet Commonly known as: NITROSTAT Place 1 tablet (0.4 mg total) under the tongue every 5 (five) minutes x 3 doses as needed for chest pain.   pantoprazole 40 MG tablet Commonly known as: PROTONIX TAKE 1 TABLET BY MOUTH DAILY BEFORE BREAKFAST.   rivaroxaban 20 MG Tabs tablet Commonly known as: Xarelto TAKE 1 TABLET BY MOUTH DAILY WITH SUPPER.   Vitamin D3 50 MCG (2000 UT) Tabs Take 2,000 Units by mouth daily.        Objective:   BP (!) 142/60   Ht 5\' 6"  (1.676 m)   Wt 208 lb (94.3 kg)   SpO2 97%   BMI 33.57 kg/m   Wt Readings from Last 3 Encounters:  08/21/20 208 lb (94.3 kg)  06/23/20 205 lb 1.6 oz (93 kg)  05/22/20 206 lb (93.4 kg)    Physical Exam Vitals and nursing note reviewed.  Constitutional:      General: She is not in acute distress.    Appearance: She is well-developed. She is not diaphoretic.  Eyes:     Conjunctiva/sclera: Conjunctivae normal.  Cardiovascular:     Rate and Rhythm: Normal rate and regular rhythm.     Heart sounds: Normal heart sounds. No murmur heard.   Pulmonary:     Effort: Pulmonary effort is normal. No respiratory distress.     Breath sounds: Normal breath sounds. No wheezing.  Musculoskeletal:        General: No tenderness.   Skin:    General: Skin is warm and dry.     Findings: No rash.  Neurological:     Mental Status: She is alert and oriented to person, place, and time.     Coordination: Coordination normal.  Psychiatric:        Behavior: Behavior normal.       Assessment & Plan:   Problem List Items Addressed This Visit      Cardiovascular and Mediastinum   AF (paroxysmal atrial fibrillation) (HCC)   Hypertension   Relevant Orders   CBC with Differential/Platelet   Lipid panel   TSH   Bayer DCA Hb A1c Waived     Endocrine   Hypothyroidism   Relevant Medications   levothyroxine (SYNTHROID) 75 MCG tablet   Other Relevant Orders   CBC with Differential/Platelet   Lipid panel   TSH   Bayer DCA Hb A1c Waived  Type 2 diabetes mellitus with other specified complication (HCC) - Primary   Relevant Orders   CBC with Differential/Platelet   Lipid panel   TSH   Bayer DCA Hb A1c Waived   Microalbumin / creatinine urine ratio     Other   Hyperlipidemia   Relevant Orders   CBC with Differential/Platelet   Lipid panel   TSH   Bayer DCA Hb A1c Waived    Other Visit Diagnoses    Gastroesophageal reflux disease       Relevant Medications   pantoprazole (PROTONIX) 40 MG tablet      Continue current medication A1c looks good at 6.2, blood pressure allowing permissive hypertension to keep the same.  No changes in medication. Follow up plan: Return in about 3 months (around 11/20/2020), or if symptoms worsen or fail to improve, for Diabetes and hypertension and cholesterol and thyroid.  Counseling provided for all of the vaccine components Orders Placed This Encounter  Procedures  . CBC with Differential/Platelet  . Lipid panel  . TSH  . Bayer DCA Hb A1c Waived  . Microalbumin / creatinine urine ratio    Caryl Pina, MD [facility] 08/21/2020, 3:56 PM

## 2020-08-22 LAB — CBC WITH DIFFERENTIAL/PLATELET
Basophils Absolute: 0 10*3/uL (ref 0.0–0.2)
Basos: 1 %
EOS (ABSOLUTE): 0.1 10*3/uL (ref 0.0–0.4)
Eos: 2 %
Hematocrit: 38.2 % (ref 34.0–46.6)
Hemoglobin: 13.2 g/dL (ref 11.1–15.9)
Immature Grans (Abs): 0 10*3/uL (ref 0.0–0.1)
Immature Granulocytes: 0 %
Lymphocytes Absolute: 1.3 10*3/uL (ref 0.7–3.1)
Lymphs: 23 %
MCH: 33.6 pg — ABNORMAL HIGH (ref 26.6–33.0)
MCHC: 34.6 g/dL (ref 31.5–35.7)
MCV: 97 fL (ref 79–97)
Monocytes Absolute: 0.6 10*3/uL (ref 0.1–0.9)
Monocytes: 10 %
Neutrophils Absolute: 3.5 10*3/uL (ref 1.4–7.0)
Neutrophils: 64 %
Platelets: 173 10*3/uL (ref 150–450)
RBC: 3.93 x10E6/uL (ref 3.77–5.28)
RDW: 12.1 % (ref 11.7–15.4)
WBC: 5.5 10*3/uL (ref 3.4–10.8)

## 2020-08-22 LAB — LIPID PANEL
Chol/HDL Ratio: 4.1 ratio (ref 0.0–4.4)
Cholesterol, Total: 211 mg/dL — ABNORMAL HIGH (ref 100–199)
HDL: 52 mg/dL (ref >39)
LDL Chol Calc (NIH): 110 mg/dL — ABNORMAL HIGH (ref 0–99)
Triglycerides: 286 mg/dL — ABNORMAL HIGH (ref 0–149)
VLDL Cholesterol Cal: 49 mg/dL — ABNORMAL HIGH (ref 5–40)

## 2020-08-22 LAB — MICROALBUMIN / CREATININE URINE RATIO
Creatinine, Urine: 177.2 mg/dL
Microalb/Creat Ratio: 11 mg/g creat (ref 0–29)
Microalbumin, Urine: 19.3 ug/mL

## 2020-08-22 LAB — TSH: TSH: 3.09 u[IU]/mL (ref 0.450–4.500)

## 2020-09-04 ENCOUNTER — Encounter (HOSPITAL_COMMUNITY): Payer: Self-pay | Admitting: Emergency Medicine

## 2020-09-04 ENCOUNTER — Emergency Department (HOSPITAL_COMMUNITY)
Admission: EM | Admit: 2020-09-04 | Discharge: 2020-09-04 | Disposition: A | Discharge status: Home / Self Care | Payer: Medicare Other | Attending: Emergency Medicine | Admitting: Emergency Medicine

## 2020-09-04 ENCOUNTER — Other Ambulatory Visit: Payer: Self-pay

## 2020-09-04 DIAGNOSIS — Z8679 Personal history of other diseases of the circulatory system: Secondary | ICD-10-CM

## 2020-09-04 DIAGNOSIS — I10 Essential (primary) hypertension: Secondary | ICD-10-CM | POA: Insufficient documentation

## 2020-09-04 DIAGNOSIS — E039 Hypothyroidism, unspecified: Secondary | ICD-10-CM | POA: Diagnosis not present

## 2020-09-04 DIAGNOSIS — Z85828 Personal history of other malignant neoplasm of skin: Secondary | ICD-10-CM | POA: Insufficient documentation

## 2020-09-04 DIAGNOSIS — Z79899 Other long term (current) drug therapy: Secondary | ICD-10-CM | POA: Insufficient documentation

## 2020-09-04 DIAGNOSIS — R002 Palpitations: Secondary | ICD-10-CM | POA: Insufficient documentation

## 2020-09-04 DIAGNOSIS — R0789 Other chest pain: Secondary | ICD-10-CM | POA: Insufficient documentation

## 2020-09-04 DIAGNOSIS — E119 Type 2 diabetes mellitus without complications: Secondary | ICD-10-CM | POA: Insufficient documentation

## 2020-09-04 DIAGNOSIS — Z955 Presence of coronary angioplasty implant and graft: Secondary | ICD-10-CM | POA: Diagnosis not present

## 2020-09-04 DIAGNOSIS — Z7901 Long term (current) use of anticoagulants: Secondary | ICD-10-CM | POA: Diagnosis not present

## 2020-09-04 LAB — CBC WITH DIFFERENTIAL/PLATELET
Abs Immature Granulocytes: 0.01 10*3/uL (ref 0.00–0.07)
Basophils Absolute: 0 10*3/uL (ref 0.0–0.1)
Basophils Relative: 1 %
Eosinophils Absolute: 0.1 10*3/uL (ref 0.0–0.5)
Eosinophils Relative: 2 %
HCT: 40.5 % (ref 36.0–46.0)
Hemoglobin: 13.7 g/dL (ref 12.0–15.0)
Immature Granulocytes: 0 %
Lymphocytes Relative: 21 %
Lymphs Abs: 1.2 10*3/uL (ref 0.7–4.0)
MCH: 33.3 pg (ref 26.0–34.0)
MCHC: 33.8 g/dL (ref 30.0–36.0)
MCV: 98.5 fL (ref 80.0–100.0)
Monocytes Absolute: 0.6 10*3/uL (ref 0.1–1.0)
Monocytes Relative: 10 %
Neutro Abs: 3.8 10*3/uL (ref 1.7–7.7)
Neutrophils Relative %: 66 %
Platelets: 161 10*3/uL (ref 150–400)
RBC: 4.11 MIL/uL (ref 3.87–5.11)
RDW: 12.2 % (ref 11.5–15.5)
WBC: 5.8 10*3/uL (ref 4.0–10.5)
nRBC: 0 % (ref 0.0–0.2)

## 2020-09-04 LAB — COMPREHENSIVE METABOLIC PANEL
ALT: 15 U/L (ref 0–44)
AST: 18 U/L (ref 15–41)
Albumin: 3.9 g/dL (ref 3.5–5.0)
Alkaline Phosphatase: 90 U/L (ref 38–126)
Anion gap: 9 (ref 5–15)
BUN: 14 mg/dL (ref 8–23)
CO2: 29 mmol/L (ref 22–32)
Calcium: 9.1 mg/dL (ref 8.9–10.3)
Chloride: 100 mmol/L (ref 98–111)
Creatinine, Ser: 0.73 mg/dL (ref 0.44–1.00)
GFR, Estimated: >60 mL/min (ref >60)
Glucose, Bld: 145 mg/dL — ABNORMAL HIGH (ref 70–99)
Potassium: 4.7 mmol/L (ref 3.5–5.1)
Sodium: 138 mmol/L (ref 135–145)
Total Bilirubin: 0.8 mg/dL (ref 0.3–1.2)
Total Protein: 6.5 g/dL (ref 6.5–8.1)

## 2020-09-04 LAB — TROPONIN I (HIGH SENSITIVITY): Troponin I (High Sensitivity): 5 ng/L (ref <18)

## 2020-09-04 NOTE — Discharge Instructions (Addendum)
Continue medications as previously prescribed.  Return to the emergency department if you develop worsening symptoms, difficulty breathing, or other new and concerning symptoms.

## 2020-09-04 NOTE — ED Triage Notes (Signed)
Pt states she ate cheese crackers then developed chest burning and then heart rate increased with sob. Pt states the pain has resolved with tums.

## 2020-09-04 NOTE — ED Provider Notes (Signed)
[facility] Provider Note   CSN: [id number] Arrival date & time: 09/04/20  0105     History Chief Complaint  Patient presents with  . Palpitations    Jenna [name] is a 85 y.o. female.  Patient is an 85 year old female with past medical history of hypertension, hyperlipidemia, GERD, fibromyalgia, and obstructive sleep apnea.  Patient presents today for evaluation of chest discomfort and palpitations.  She reports eating cheese-its this evening, then shortly after developed burning in her chest along with an episode of rapid heartbeat.  This lasted for several minutes, then seemed to improved after she used Tums.  She denies any shortness of breath, nausea, diaphoresis, or radiation to the arm or jaw.  Patient is now symptom-free.  She has history of heart cath in June 2020 showing essentially normal coronary arteries.  The history is provided by the patient.  Palpitations Palpitations quality:  Fast Onset quality:  Sudden Timing:  Constant Progression:  Resolved Chronicity:  Recurrent Context comment:  Eating Relieved by: Tums. Worsened by:  Nothing Ineffective treatments:  None tried      Past Medical History:  Diagnosis Date  . Cataract   . DDD (degenerative disc disease)   . Fibromyalgia   . Herpes zoster   . History of skin cancer   . Hyperlipidemia   . Hypertension   . Hypothyroidism   . Iron deficiency anemia due to chronic blood loss 04/06/2018  . OA (osteoarthritis)   . OSA on CPAP 01/16/2018   Mild OSA overall with an AHI of 5.8/h but during REM sleep moderate with an AHI of 28/h.  Oxygen saturations dropped to 76% during respiratory events.  Now on CPAP at 9 cm H2O   . Osteoporosis   . Palpitations   . Pre-diabetes   . PUD (peptic ulcer disease)   . Skin cancer of nose    [name]    Patient Active Problem List   Diagnosis Date Noted  . Iron deficiency anemia due to chronic blood loss 04/06/2018  . Guaiac positive stools  03/30/2018  . OSA on CPAP 01/16/2018  . B12 deficiency anemia 07/15/2017  . Hypertension   . Precordial chest pain 02/05/2014  . History of shingles 01/11/2014  . Type 2 diabetes mellitus with other specified complication ([place]) 73/53/2992  . Obesity (BMI 30.0-34.9) 11/21/2013  . Metabolic syndrome 42/68/3419  . Statin intolerance 10/23/2013  . Osteopenia of the elderly 07/25/2013  . Vitamin D deficiency 06/19/2013  . Gout 02/21/2013  . Osteoarthritis 02/21/2013  . Hypothyroidism   . Fibromyalgia   . PUD (peptic ulcer disease)   . Hyperlipidemia 11/14/2008  . AF (paroxysmal atrial fibrillation) ([place]) 11/14/2008    Past Surgical History:  Procedure Laterality Date  . ABDOMINAL HYSTERECTOMY     partial  . BIOPSY  04/24/2018   Procedure: BIOPSY;  Surgeon: Rogene Houston, MD;  [facility];  Service: Endoscopy;;  gastric body ulcerated surface  . BREAST BIOPSY     Right  . CATARACT EXTRACTION W/PHACO  10/28/2011   Procedure: CATARACT EXTRACTION PHACO AND INTRAOCULAR LENS PLACEMENT (IOC);  Surgeon: Tonny Branch, MD;  Location: [facility];  Service: Ophthalmology;  Laterality: Right;  CDE 18.38  . CATARACT EXTRACTION W/PHACO  02/14/2012   Procedure: CATARACT EXTRACTION PHACO AND INTRAOCULAR LENS PLACEMENT (IOC);  Surgeon: Tonny Branch, MD;  Location: [facility];  Service: Ophthalmology;  Laterality: Left;  CDE 17.20  . CESAREAN SECTION    . COLONOSCOPY N/A 05/31/2013  Procedure: COLONOSCOPY;  Surgeon: Rogene Houston, MD;  [facility];  Service: Endoscopy;  Laterality: N/A;  240  . COLONOSCOPY N/A 04/24/2018   Procedure: COLONOSCOPY;  Surgeon: Rogene Houston, MD;  [facility];  Service: Endoscopy;  Laterality: N/A;  9:30  . ESOPHAGOGASTRODUODENOSCOPY N/A 04/24/2018   Procedure: ESOPHAGOGASTRODUODENOSCOPY (EGD);  Surgeon: Rogene Houston, MD;  [facility];  Service: Endoscopy;  Laterality: N/A;  . EYE SURGERY     skin removal   . FRACTURE SURGERY   1952   fractured right arm  . LEFT HEART CATH AND CORONARY ANGIOGRAPHY N/A 09/25/2018   Procedure: LEFT HEART CATH AND CORONARY ANGIOGRAPHY;  Surgeon: Jettie Booze, MD;  Location: [facility];  Service: Cardiovascular;  Laterality: N/A;     OB History   No obstetric history on file.     Family History  Problem Relation Age of Onset  . CVA Mother   . Stroke Mother   . Osteoporosis Mother   . Cancer Father        prostate  . Bronchitis Sister   . COPD Sister   . Early death Brother        died in [place]  . Carpal tunnel syndrome Sister   . Cancer Brother        throat  . Cancer Brother        lung  . Cancer Brother        throat  . Stroke Brother   . Heart attack Brother   . Gout Brother   . Heart Problems Brother        stents  . CAD Brother   . Hypertension Brother   . Gout Brother   . Arthritis Brother   . Diabetes Son   . Coronary artery disease Neg Hx        No premature  . Anesthesia problems Neg Hx   . Hypotension Neg Hx   . Malignant hyperthermia Neg Hx   . Pseudochol deficiency Neg Hx     Social History   Tobacco Use  . Smoking status: Never Smoker  . Smokeless tobacco: Never Used  Vaping Use  . Vaping Use: Never used  Substance Use Topics  . Alcohol use: No  . Drug use: No    Home Medications Prior to Admission medications   Medication Sig Start Date End Date Taking? Authorizing Provider  acetaminophen (TYLENOL) 500 MG tablet Take 500 mg by mouth daily as needed for moderate pain or headache.     [provider]  Cholecalciferol (VITAMIN D3) 2000 UNITS TABS Take 2,000 Units by mouth daily.    [provider]  colchicine 0.6 MG tablet TAKE 1 TABLET DAILY AS NEEDED FOR GOUT FLARE UP 08/08/20   Dettinger, Fransisca Kaufmann, MD  furosemide (LASIX) 40 MG tablet Take 40 mg by mouth as needed.     [provider]  hydrocortisone (ANUSOL-HC) 2.5 % rectal cream APPLY RECTALLY DAILY AS NEEDED FOR HEMMORHOIDS OR ITCHING.  04/21/20   Dettinger, Fransisca Kaufmann, MD  levothyroxine (SYNTHROID) 75 MCG tablet Take 1 tablet (75 mcg total) by mouth daily. 08/21/20   Dettinger, Fransisca Kaufmann, MD  metoprolol succinate (TOPROL-XL) 50 MG 24 hr tablet TAKE 1 TABLET DAILY WITH OR IMMEDIATELY FOLLOWING A MEAL. 05/22/20   Dettinger, Fransisca Kaufmann, MD  nitroGLYCERIN (NITROSTAT) 0.4 MG SL tablet Place 1 tablet (0.4 mg total) under the tongue every 5 (five) minutes x 3 doses as  needed for chest pain. 09/25/18   Debbe Odea, MD  pantoprazole (PROTONIX) 40 MG tablet TAKE 1 TABLET BY MOUTH DAILY BEFORE BREAKFAST. 08/21/20   Dettinger, Fransisca Kaufmann, MD  rivaroxaban (XARELTO) 20 MG TABS tablet TAKE 1 TABLET BY MOUTH DAILY WITH SUPPER. 05/22/20   Dettinger, Fransisca Kaufmann, MD    Allergies    Daypro [oxaprozin], Diclofenac sodium, Effexor [venlafaxine hydrochloride], Lidocaine-prilocaine, Prozac [fluoxetine hcl], Amitriptyline, Cymbalta [duloxetine hcl], Duloxetine, Lipitor [atorvastatin calcium], Penicillins, Savella [milnacipran hcl], Sulfa antibiotics, and Zocor [simvastatin]  Review of Systems   Review of Systems  Cardiovascular: Positive for palpitations.  All other systems reviewed and are negative.   Physical Exam Updated Vital Signs BP (!) 161/63   Pulse 75   Temp 98.1 F (36.7 C) (Oral)   Resp 16   Ht 5\' 6"  (1.676 m)   Wt 90.7 kg   SpO2 97%   BMI 32.28 kg/m   Physical Exam Vitals and nursing note reviewed.  Constitutional:      General: She is not in acute distress.    Appearance: She is well-developed. She is not diaphoretic.  HENT:     Head: Normocephalic and atraumatic.  Cardiovascular:     Rate and Rhythm: Normal rate and regular rhythm.     Heart sounds: No murmur heard. No friction rub. No gallop.   Pulmonary:     Effort: Pulmonary effort is normal. No respiratory distress.     Breath sounds: Normal breath sounds. No wheezing.  Abdominal:     General: Bowel sounds are normal. There is no distension.     Palpations: Abdomen is  soft.     Tenderness: There is no abdominal tenderness.  Musculoskeletal:        General: Normal range of motion.     Cervical back: Normal range of motion and neck supple.  Skin:    General: Skin is warm and dry.  Neurological:     Mental Status: She is alert and oriented to person, place, and time.     ED Results / Procedures / Treatments   Labs (all labs ordered are listed, but only abnormal results are displayed) Labs Reviewed - No data to display  EKG EKG Interpretation  Date/Time:  Thursday September 04 2020 01:15:14 EDT Ventricular Rate:  75 PR Interval:  168 QRS Duration: 86 QT Interval:  385 QTC Calculation: 430 R Axis:   46 Text Interpretation: Sinus rhythm Abnormal R-wave progression, early transition Confirmed by Veryl Speak [phone]) on 09/04/2020 1:58:15 AM   Radiology No results found.  Procedures Procedures   Medications Ordered in ED Medications - No data to display  ED Course  I have reviewed the triage vital signs and the nursing notes.  Pertinent labs & imaging results that were available during my care of the patient were reviewed by me and considered in my medical decision making (see chart for details).    MDM Rules/Calculators/A&P  Patient presenting here with complaints of burning in her chest that started after eating cheese its earlier this evening.  Her discomfort seemed to improve with Tums, but she did experience an episode of palpitations and decided to be evaluated.  Upon arrival here vitals are stable and she is afebrile.  Her initial EKG shows a sinus rhythm with no acute changes.  Troponin is negative and laboratory studies essentially unremarkable.  She has felt well throughout her emergency department course and experience no further palpitations or chest discomfort.  I have reviewed her cath report from  less than 2 years ago and patient has minimal calcifications, with her most significant blockage being 15 to 20%.  I feel as though  the chances of this episode being cardiac are exceedingly low, but may be she did have an episode of A. fib which has since resolved. Patient seems appropriate for discharge.  Final Clinical Impression(s) / ED Diagnoses Final diagnoses:  None    Rx / DC Orders ED Discharge Orders    None       Veryl Speak, MD 09/04/20 [phone]

## 2020-09-08 ENCOUNTER — Ambulatory Visit (INDEPENDENT_AMBULATORY_CARE_PROVIDER_SITE_OTHER): Payer: Medicare Other

## 2020-09-08 ENCOUNTER — Other Ambulatory Visit: Payer: Self-pay

## 2020-09-08 DIAGNOSIS — E538 Deficiency of other specified B group vitamins: Secondary | ICD-10-CM | POA: Diagnosis not present

## 2020-09-08 NOTE — Progress Notes (Signed)
B12 injection given to patient and tolerated well.  

## 2020-09-10 ENCOUNTER — Other Ambulatory Visit: Payer: Self-pay | Admitting: Family Medicine

## 2020-10-09 ENCOUNTER — Ambulatory Visit (INDEPENDENT_AMBULATORY_CARE_PROVIDER_SITE_OTHER): Payer: Medicare Other

## 2020-10-09 ENCOUNTER — Other Ambulatory Visit: Payer: Self-pay

## 2020-10-09 DIAGNOSIS — E538 Deficiency of other specified B group vitamins: Secondary | ICD-10-CM | POA: Diagnosis not present

## 2020-10-09 NOTE — Progress Notes (Signed)
Cyanocobalamin injection given to left deltoid.  Patient tolerated well. 

## 2020-10-29 ENCOUNTER — Encounter: Payer: Self-pay | Admitting: Cardiology

## 2020-10-29 ENCOUNTER — Ambulatory Visit (INDEPENDENT_AMBULATORY_CARE_PROVIDER_SITE_OTHER): Payer: Medicare Other | Admitting: Cardiology

## 2020-10-29 VITALS — BP 124/70 | HR 68 | Ht 66.0 in | Wt 208.4 lb

## 2020-10-29 DIAGNOSIS — D6869 Other thrombophilia: Secondary | ICD-10-CM

## 2020-10-29 DIAGNOSIS — I890 Lymphedema, not elsewhere classified: Secondary | ICD-10-CM

## 2020-10-29 DIAGNOSIS — I480 Paroxysmal atrial fibrillation: Secondary | ICD-10-CM

## 2020-10-29 NOTE — Patient Instructions (Addendum)

## 2020-10-29 NOTE — Progress Notes (Signed)
Cardiology Office Note  Date: 10/29/2020   ID: [name], [name] 13-Nov-1935, MRN [id number]  PCP:  Dettinger, Fransisca Kaufmann, MD  Cardiologist:  Rozann Lesches, MD Electrophysiologist:  None   Chief Complaint  Patient presents with  . Cardiac follow-up    History of Present Illness: Jenna [name] is an 85 y.o. female last seen in December 2021.  She presents for a routine visit.  Overall doing well at this point.  She was seen in the ER for evaluation of chest discomfort and palpitations, occurred after eating.  ECG did not show recurrent atrial fibrillation and cardiac enzymes were negative for ACS.  She tells me in retrospect that she had run out of some of her cardiac medications at that point.  She does not report any bleeding problems on Xarelto, taking Toprol-XL 50 mg daily.  Past Medical History:  Diagnosis Date  . Cataract   . DDD (degenerative disc disease)   . Fibromyalgia   . Herpes zoster   . History of skin cancer   . Hyperlipidemia   . Hypertension   . Hypothyroidism   . Iron deficiency anemia due to chronic blood loss 04/06/2018  . OA (osteoarthritis)   . OSA on CPAP 01/16/2018   Mild OSA overall with an AHI of 5.8/h but during REM sleep moderate with an AHI of 28/h.  Oxygen saturations dropped to 76% during respiratory events.  Now on CPAP at 9 cm H2O   . Osteoporosis   . Palpitations   . Pre-diabetes   . PUD (peptic ulcer disease)   . Skin cancer of nose    [name]    Past Surgical History:  Procedure Laterality Date  . ABDOMINAL HYSTERECTOMY     partial  . BIOPSY  04/24/2018   Procedure: BIOPSY;  Surgeon: Rogene Houston, MD;  [facility];  Service: Endoscopy;;  gastric body ulcerated surface  . BREAST BIOPSY     Right  . CATARACT EXTRACTION W/PHACO  10/28/2011   Procedure: CATARACT EXTRACTION PHACO AND INTRAOCULAR LENS PLACEMENT (IOC);  Surgeon: Tonny Branch, MD;  [facility];  Service: Ophthalmology;  Laterality: Right;   CDE 18.38  . CATARACT EXTRACTION W/PHACO  02/14/2012   Procedure: CATARACT EXTRACTION PHACO AND INTRAOCULAR LENS PLACEMENT (IOC);  Surgeon: Tonny Branch, MD;  [facility];  Service: Ophthalmology;  Laterality: Left;  CDE 17.20  . CESAREAN SECTION    . COLONOSCOPY N/A 05/31/2013   Procedure: COLONOSCOPY;  Surgeon: Rogene Houston, MD;  [facility];  Service: Endoscopy;  Laterality: N/A;  240  . COLONOSCOPY N/A 04/24/2018   Procedure: COLONOSCOPY;  Surgeon: Rogene Houston, MD;  [facility];  Service: Endoscopy;  Laterality: N/A;  9:30  . ESOPHAGOGASTRODUODENOSCOPY N/A 04/24/2018   Procedure: ESOPHAGOGASTRODUODENOSCOPY (EGD);  Surgeon: Rogene Houston, MD;  [facility];  Service: Endoscopy;  Laterality: N/A;  . EYE SURGERY     skin removal   . FRACTURE SURGERY  1952   fractured right arm  . LEFT HEART CATH AND CORONARY ANGIOGRAPHY N/A 09/25/2018   Procedure: LEFT HEART CATH AND CORONARY ANGIOGRAPHY;  Surgeon: Jettie Booze, MD;  Location: [facility];  Service: Cardiovascular;  Laterality: N/A;    Current Outpatient Medications  Medication Sig Dispense Refill  . acetaminophen (TYLENOL) 500 MG tablet Take 500 mg by mouth daily as needed for moderate pain or headache.     . Cholecalciferol (VITAMIN D3) 2000  UNITS TABS Take 2,000 Units by mouth daily.    . colchicine 0.6 MG tablet TAKE 1 TABLET DAILY AS NEEDED FOR GOUT FLARE UP 30 tablet 0  . furosemide (LASIX) 40 MG tablet Take 40 mg by mouth as needed.     . hydrocortisone (ANUSOL-HC) 2.5 % rectal cream APPLY RECTALLY DAILY AS NEEDED FOR HEMMORHOIDS OR ITCHING. 30 g 0  . levothyroxine (SYNTHROID) 75 MCG tablet Take 1 tablet (75 mcg total) by mouth daily. 90 tablet 3  . metoprolol succinate (TOPROL-XL) 50 MG 24 hr tablet TAKE 1 TABLET DAILY WITH OR IMMEDIATELY FOLLOWING A MEAL. 90 tablet 3  . nitroGLYCERIN (NITROSTAT) 0.4 MG SL tablet PLACE 1 TAB UNDER TONGUE EVERY 3 MINUTES AS DIRECTED UP TO 3  TIMES AS NEEDED FOR CHEST PAIN. 25 tablet 2  . pantoprazole (PROTONIX) 40 MG tablet TAKE 1 TABLET BY MOUTH DAILY BEFORE BREAKFAST. 90 tablet 3  . rivaroxaban (XARELTO) 20 MG TABS tablet TAKE 1 TABLET BY MOUTH DAILY WITH SUPPER. 90 tablet 3   Current Facility-Administered Medications  Medication Dose Route Frequency Provider Last Rate Last Admin  . cyanocobalamin ((VITAMIN B-12)) injection 1,000 mcg  1,000 mcg Intramuscular Q30 days Dettinger, Fransisca Kaufmann, MD   1,000 mcg at 10/09/20 1456   Allergies:  Daypro [oxaprozin], Diclofenac sodium, Effexor [venlafaxine hydrochloride], Lidocaine-prilocaine, Prozac [fluoxetine hcl], Amitriptyline, Cymbalta [duloxetine hcl], Duloxetine, Lipitor [atorvastatin calcium], Penicillins, Savella [milnacipran hcl], Sulfa antibiotics, and Zocor [simvastatin]   ROS: No dizziness or syncope.  Physical Exam: VS:  BP 124/70   Pulse 68   Ht 5\' 6"  (1.676 m)   Wt 208 lb 6.4 oz (94.5 kg)   SpO2 95%   BMI 33.64 kg/m , BMI Body mass index is 33.64 kg/m.  Wt Readings from Last 3 Encounters:  10/29/20 208 lb 6.4 oz (94.5 kg)  09/04/20 200 lb (90.7 kg)  08/21/20 208 lb (94.3 kg)    General: Patient appears comfortable at rest. HEENT: Conjunctiva and lids normal, wearing a mask. Neck: Supple, no elevated JVP or carotid bruits, no thyromegaly. Lungs: Clear to auscultation, nonlabored breathing at rest. Cardiac: Regular rate and rhythm, no S3 or significant systolic murmur, no pericardial rub. Extremities: No pitting edema.  ECG:  An ECG dated 09/04/2020 was personally reviewed today and demonstrated:  Normal sinus rhythm.  Recent Labwork: 08/21/2020: TSH 3.090 09/04/2020: ALT 15; AST 18; BUN 14; Creatinine, Ser 0.73; Hemoglobin 13.7; Platelets 161; Potassium 4.7; Sodium 138     Component Value Date/Time   CHOL 211 (H) 08/21/2020 1515   TRIG 286 (H) 08/21/2020 1515   TRIG 161 (H) 10/14/2016 1010   HDL 52 08/21/2020 1515   HDL 54 10/14/2016 1010   CHOLHDL 4.1  08/21/2020 1515   CHOLHDL 3.6 10/16/2008 0540   VLDL 23 10/16/2008 0540   LDLCALC 110 (H) 08/21/2020 1515   LDLCALC 138 (H) 02/26/2014 1001    Other Studies Reviewed Today:  Echocardiogram 03/29/2018: Study Conclusions  - Left ventricle: The cavity size was normal. Wall thickness was increased in a pattern of mild LVH. Systolic function was vigorous. The estimated ejection fraction was in the range of 65% to 70%. Wall motion was normal; there were no regional wall motion abnormalities. Left ventricular diastolic function parameters were normal. - Aortic valve: There was mild regurgitation. Valve area (VTI): 2.92 cm^2. Valve area (Vmax): 2.78 cm^2. Valve area (Vmean): 2.95 cm^2.  Cardiac catheterization 09/25/2018:  Prox LAD lesion is 20% stenosed.  Mid RCA lesion is 10% stenosed.  The  left ventricular systolic function is normal.  LV end diastolic pressure is normal.  The left ventricular ejection fraction is 55-65% by visual estimate.  There is no aortic valve stenosis.  Nonobstructive CAD.   Assessment and Plan:  1.  Paroxysmal atrial fibrillation/flutter with CHA2DS2-VASc score of 3.  Plan to continue Xarelto for stroke prophylaxis along with Toprol-XL for heart rate control.  I reviewed her lab work from April, she reports no bleeding problems.  2.  Acquired thrombophilia, on Xarelto for stroke prophylaxis.  Recent hemoglobin 13.7.  3.  Chronic lymphedema, controlled with compression stockings and Lasix.  Renal function and potassium normal.  Medication Adjustments/Labs and Tests Ordered: Current medicines are reviewed at length with the patient today.  Concerns regarding medicines are outlined above.   Tests Ordered: No orders of the defined types were placed in this encounter.   Medication Changes: No orders of the defined types were placed in this encounter.   Disposition:  Follow up 6 months.  Signed, Satira Sark, MD,  [facility] 10/29/2020 2:34 PM    [facility] [place], [place],  [address] Phone: [phone]; Fax: [phone]

## 2020-11-10 ENCOUNTER — Other Ambulatory Visit: Payer: Self-pay

## 2020-11-10 ENCOUNTER — Ambulatory Visit (INDEPENDENT_AMBULATORY_CARE_PROVIDER_SITE_OTHER): Payer: Medicare Other

## 2020-11-10 DIAGNOSIS — E538 Deficiency of other specified B group vitamins: Secondary | ICD-10-CM | POA: Diagnosis not present

## 2020-11-10 NOTE — Progress Notes (Signed)
B12 injection given to patient and tolerated well.  

## 2020-11-12 ENCOUNTER — Other Ambulatory Visit: Payer: Self-pay | Admitting: Family Medicine

## 2020-11-21 ENCOUNTER — Encounter: Payer: Self-pay | Admitting: Family Medicine

## 2020-11-21 ENCOUNTER — Ambulatory Visit (INDEPENDENT_AMBULATORY_CARE_PROVIDER_SITE_OTHER): Payer: Medicare Other | Admitting: Family Medicine

## 2020-11-21 ENCOUNTER — Other Ambulatory Visit: Payer: Self-pay

## 2020-11-21 VITALS — BP 120/59 | HR 74 | Ht 66.0 in | Wt 205.0 lb

## 2020-11-21 DIAGNOSIS — E1169 Type 2 diabetes mellitus with other specified complication: Secondary | ICD-10-CM | POA: Diagnosis not present

## 2020-11-21 DIAGNOSIS — E039 Hypothyroidism, unspecified: Secondary | ICD-10-CM | POA: Diagnosis not present

## 2020-11-21 DIAGNOSIS — I480 Paroxysmal atrial fibrillation: Secondary | ICD-10-CM

## 2020-11-21 DIAGNOSIS — E782 Mixed hyperlipidemia: Secondary | ICD-10-CM

## 2020-11-21 DIAGNOSIS — I10 Essential (primary) hypertension: Secondary | ICD-10-CM

## 2020-11-21 LAB — BAYER DCA HB A1C WAIVED: HB A1C (BAYER DCA - WAIVED): 6.3 % (ref <7.0)

## 2020-11-21 MED ORDER — COLCHICINE 0.6 MG PO TABS: ORAL_TABLET | ORAL | 5 refills | Status: DC

## 2020-11-21 MED ORDER — METOPROLOL SUCCINATE ER 50 MG PO TB24: ORAL_TABLET | ORAL | 3 refills | Status: DC

## 2020-11-21 NOTE — Progress Notes (Signed)
BP (!) 120/59   Pulse 74   Ht 5\' 6"  (1.676 m)   Wt 205 lb (93 kg)   SpO2 93%   BMI 33.09 kg/m    Subjective:   Patient ID: Jenna [name], female    DOB: [birth year], 85 y.o.   MRN: [id number]  HPI: Jenna [name] is a 85 y.o. female presenting on 11/21/2020 for Medical Management of Chronic Issues, Hyperlipidemia, Diabetes, and Hypothyroidism   HPI Type 2 diabetes mellitus Patient comes in today for recheck of his diabetes. Patient has been currently taking no medication currently, A1c is pending. Patient is not currently on an ACE inhibitor/ARB. Patient has not seen an ophthalmologist this year. Patient denies any issues with their feet except for one spot on the bottom of her foot that is hard and slightly tender when she steps on it.. The symptom started onset as an adult hyperlipidemia and hypothyroidism and A. fib ARE RELATED TO DM   Hyperlipidemia and A. fib Patient is coming in for recheck of his hyperlipidemia. The patient is currently taking furosemide and metoprolol and Xarelto, has been intolerant of many statins. They deny any issues with myalgias or history of liver damage from it. They deny any focal numbness or weakness or chest pain.   Hypothyroidism recheck Patient is coming in for thyroid recheck today as well. They deny any issues with hair changes or heat or cold problems or diarrhea or constipation. They deny any chest pain or palpitations. They are currently on levothyroxine 75 micrograms   Relevant past medical, surgical, family and social history reviewed and updated as indicated. Interim medical history since our last visit reviewed. Allergies and medications reviewed and updated.  Review of Systems  Constitutional:  Negative for chills and fever.  Eyes:  Negative for visual disturbance.  Respiratory:  Negative for chest tightness and shortness of breath.   Cardiovascular:  Negative for chest pain and leg swelling.  Musculoskeletal:  Positive for  arthralgias (Bottom of right foot on the pad by her great toe, firm nodule that is slightly tender.  No erythema or warmth). Negative for back pain and gait problem.  Skin:  Negative for rash.  Neurological:  Negative for light-headedness and headaches.  Psychiatric/Behavioral:  Negative for agitation, behavioral problems and sleep disturbance.   All other systems reviewed and are negative.  Per HPI unless specifically indicated above   Allergies as of 11/21/2020       Reactions   Daypro [oxaprozin]    Unknown reaction   Diclofenac Sodium    Unknown reaction   Effexor [venlafaxine Hydrochloride]    Unknown reaction   Lidocaine-prilocaine    Prozac [fluoxetine Hcl]    Unknown reaction   Amitriptyline Rash   Cymbalta [duloxetine Hcl] Nausea Only   Duloxetine Nausea Only   Lipitor [atorvastatin Calcium] Other (See Comments)   Leg aches   Penicillins Rash   Has patient had a PCN reaction causing immediate rash, facial/tongue/throat swelling, SOB or lightheadedness with hypotension: Yes Has patient had a PCN reaction causing severe rash involving mucus membranes or skin necrosis: No Has patient had a PCN reaction that required hospitalization: No Has patient had a PCN reaction occurring within the last 10 years: No If all of the above answers are "NO", then may proceed with Cephalosporin use.   Savella [milnacipran Hcl] Rash   Sulfa Antibiotics Rash   Zocor [simvastatin] Other (See Comments)   Hip pain        Medication List  Accurate as of November 21, 2020  2:25 PM. If you have any questions, ask your nurse or doctor.          acetaminophen 500 MG tablet Commonly known as: TYLENOL Take 500 mg by mouth daily as needed for moderate pain or headache.   colchicine 0.6 MG tablet TAKE 1 TABLET DAILY AS NEEDED FOR GOUT FLARE UP   furosemide 40 MG tablet Commonly known as: LASIX Take 40 mg by mouth as needed.   hydrocortisone 2.5 % rectal cream Commonly known as:  ANUSOL-HC APPLY RECTALLY DAILY AS NEEDED FOR HEMMORHOIDS OR ITCHING.   levothyroxine 75 MCG tablet Commonly known as: SYNTHROID Take 1 tablet (75 mcg total) by mouth daily.   metoprolol succinate 50 MG 24 hr tablet Commonly known as: TOPROL-XL TAKE 1 TABLET ONCE A DAY WITH OR IMMEDIATELY FOLLOWING A MEAL.   nitroGLYCERIN 0.4 MG SL tablet Commonly known as: NITROSTAT PLACE 1 TAB UNDER TONGUE EVERY 3 MINUTES AS DIRECTED UP TO 3 TIMES AS NEEDED FOR CHEST PAIN.   pantoprazole 40 MG tablet Commonly known as: PROTONIX TAKE 1 TABLET BY MOUTH DAILY BEFORE BREAKFAST.   rivaroxaban 20 MG Tabs tablet Commonly known as: Xarelto TAKE 1 TABLET BY MOUTH DAILY WITH SUPPER.   Vitamin D3 50 MCG (2000 UT) Tabs Take 2,000 Units by mouth daily.         Objective:   BP (!) 120/59   Pulse 74   Ht 5\' 6"  (1.676 m)   Wt 205 lb (93 kg)   SpO2 93%   BMI 33.09 kg/m   Wt Readings from Last 3 Encounters:  11/21/20 205 lb (93 kg)  10/29/20 208 lb 6.4 oz (94.5 kg)  09/04/20 200 lb (90.7 kg)    Physical Exam Vitals and nursing note reviewed.  Constitutional:      General: She is not in acute distress.    Appearance: She is well-developed. She is not diaphoretic.  Eyes:     Conjunctiva/sclera: Conjunctivae normal.  Cardiovascular:     Rate and Rhythm: Normal rate and regular rhythm.     Heart sounds: Normal heart sounds. No murmur heard. Pulmonary:     Effort: Pulmonary effort is normal. No respiratory distress.     Breath sounds: Normal breath sounds. No wheezing.  Musculoskeletal:        General: No tenderness. Normal range of motion.     Comments: Bottom of right foot on the pad by her great toe, firm nodule that is slightly tender.  No erythema or warmth  Skin:    General: Skin is warm and dry.     Findings: No rash.  Neurological:     Mental Status: She is alert and oriented to person, place, and time.     Coordination: Coordination normal.  Psychiatric:        Behavior:  Behavior normal.      Assessment & Plan:   Problem List Items Addressed This Visit       Cardiovascular and Mediastinum   AF (paroxysmal atrial fibrillation) (HCC)   Relevant Medications   metoprolol succinate (TOPROL-XL) 50 MG 24 hr tablet   Hypertension   Relevant Medications   metoprolol succinate (TOPROL-XL) 50 MG 24 hr tablet     Endocrine   Hypothyroidism   Relevant Medications   metoprolol succinate (TOPROL-XL) 50 MG 24 hr tablet   Other Relevant Orders   TSH   Type 2 diabetes mellitus with other specified complication ([place]) - Primary   Relevant  Orders   Lipid panel   TSH   Bayer DCA Hb A1c Waived     Other   Hyperlipidemia   Relevant Medications   metoprolol succinate (TOPROL-XL) 50 MG 24 hr tablet   Other Relevant Orders   Lipid panel   TSH   Bayer DCA Hb A1c Waived    Continue current medication, A1c is pending and labs are pending.  On the right great toe that is likely a bone spur, recommended for her to go see podiatry.  Does not appear to have any signs of gout currently Follow up plan: Return in about 3 months (around 02/21/2021), or if symptoms worsen or fail to improve, for Diabetes recheck.  Counseling provided for all of the vaccine components Orders Placed This Encounter  Procedures   Lipid panel   TSH   Bayer Fleming Hb A1c [place] Gloriajean Okun, MD [facility] 11/21/2020, 2:25 PM

## 2020-11-22 LAB — LIPID PANEL
Chol/HDL Ratio: 4.3 ratio (ref 0.0–4.4)
Cholesterol, Total: 211 mg/dL — ABNORMAL HIGH (ref 100–199)
HDL: 49 mg/dL (ref >39)
LDL Chol Calc (NIH): 129 mg/dL — ABNORMAL HIGH (ref 0–99)
Triglycerides: 185 mg/dL — ABNORMAL HIGH (ref 0–149)
VLDL Cholesterol Cal: 33 mg/dL (ref 5–40)

## 2020-11-22 LAB — TSH: TSH: 3.49 u[IU]/mL (ref 0.450–4.500)

## 2020-12-11 ENCOUNTER — Other Ambulatory Visit: Payer: Self-pay

## 2020-12-11 ENCOUNTER — Ambulatory Visit (INDEPENDENT_AMBULATORY_CARE_PROVIDER_SITE_OTHER): Payer: Medicare Other | Admitting: *Deleted

## 2020-12-11 DIAGNOSIS — E538 Deficiency of other specified B group vitamins: Secondary | ICD-10-CM | POA: Diagnosis not present

## 2020-12-13 ENCOUNTER — Other Ambulatory Visit: Payer: Self-pay | Admitting: Family Medicine

## 2020-12-15 ENCOUNTER — Other Ambulatory Visit: Payer: Self-pay

## 2020-12-15 ENCOUNTER — Inpatient Hospital Stay (HOSPITAL_COMMUNITY): Payer: Medicare Other | Attending: Hematology

## 2020-12-15 DIAGNOSIS — D509 Iron deficiency anemia, unspecified: Secondary | ICD-10-CM | POA: Insufficient documentation

## 2020-12-15 DIAGNOSIS — E538 Deficiency of other specified B group vitamins: Secondary | ICD-10-CM | POA: Insufficient documentation

## 2020-12-15 DIAGNOSIS — D500 Iron deficiency anemia secondary to blood loss (chronic): Secondary | ICD-10-CM

## 2020-12-15 LAB — IRON AND TIBC
Iron: 74 ug/dL (ref 28–170)
Saturation Ratios: 25 % (ref 10.4–31.8)
TIBC: 301 ug/dL (ref 250–450)
UIBC: 227 ug/dL

## 2020-12-15 LAB — CBC WITH DIFFERENTIAL/PLATELET
Abs Immature Granulocytes: 0.01 10*3/uL (ref 0.00–0.07)
Basophils Absolute: 0 10*3/uL (ref 0.0–0.1)
Basophils Relative: 1 %
Eosinophils Absolute: 0.1 10*3/uL (ref 0.0–0.5)
Eosinophils Relative: 2 %
HCT: 37.9 % (ref 36.0–46.0)
Hemoglobin: 13.2 g/dL (ref 12.0–15.0)
Immature Granulocytes: 0 %
Lymphocytes Relative: 20 %
Lymphs Abs: 1 10*3/uL (ref 0.7–4.0)
MCH: 33.8 pg (ref 26.0–34.0)
MCHC: 34.8 g/dL (ref 30.0–36.0)
MCV: 96.9 fL (ref 80.0–100.0)
Monocytes Absolute: 0.5 10*3/uL (ref 0.1–1.0)
Monocytes Relative: 10 %
Neutro Abs: 3.5 10*3/uL (ref 1.7–7.7)
Neutrophils Relative %: 67 %
Platelets: 158 10*3/uL (ref 150–400)
RBC: 3.91 MIL/uL (ref 3.87–5.11)
RDW: 12.5 % (ref 11.5–15.5)
WBC: 5.2 10*3/uL (ref 4.0–10.5)
nRBC: 0 % (ref 0.0–0.2)

## 2020-12-15 LAB — FERRITIN: Ferritin: 91 ng/mL (ref 11–307)

## 2020-12-15 LAB — VITAMIN B12: Vitamin B-12: 584 pg/mL (ref 180–914)

## 2020-12-18 DIAGNOSIS — J020 Streptococcal pharyngitis: Secondary | ICD-10-CM | POA: Diagnosis not present

## 2020-12-18 DIAGNOSIS — J029 Acute pharyngitis, unspecified: Secondary | ICD-10-CM | POA: Diagnosis not present

## 2020-12-19 NOTE — Progress Notes (Signed)
Jenna [name], Jenna [name]   CLINIC:  Medical Oncology/Hematology  PCP:  Dettinger, Fransisca Kaufmann, MD [place] [id number] [phone]   REASON FOR VISIT:  Follow-up for iron deficiency anemia  PRIOR THERAPY: None  CURRENT THERAPY: Intermittent Feraheme (last on 03/07/2020)  INTERVAL HISTORY:  Jenna [name] 85 y.o. female returns for routine follow-up of Jenna [name] iron deficiency anemia.  Jenna [name] was last seen by Dr. Delton Coombes on 06/23/2020.  At today's visit, Jenna [name] reports feeling fair.  No recent hospitalizations, surgeries, or changes in baseline health status.  Jenna [name] continues to have baseline fatigue, reports that Jenna [name] stays "weak Jenna tired" most of the [name], but is still able to complete Jenna [name] ADLs.  Jenna [name] denies any chest pain, shortness of breath with normal activities, syncope, or palpitations.  No B symptoms such as fever, chills, night sweats, unintentional weight loss.  Jenna [name] reports occasional scant rectal bleeding from hemorrhoids, but denies any gross rectal hemorrhage, melena, or other sources of blood loss.  Jenna [name] has 75% energy Jenna 100% appetite. Jenna [name] endorses that Jenna [name] is maintaining a stable weight.    REVIEW OF SYSTEMS:  Review of Systems  Constitutional:  Positive for fatigue. Negative for appetite change, chills, diaphoresis, fever Jenna unexpected weight change.  HENT:   Negative for lump/mass Jenna nosebleeds.   Eyes:  Negative for eye problems.  Respiratory:  Negative for cough, hemoptysis Jenna shortness of breath.   Cardiovascular:  Negative for chest pain, leg swelling Jenna palpitations.  Gastrointestinal:  Negative for abdominal pain, blood in stool, constipation, diarrhea, nausea Jenna vomiting.  Genitourinary:  Negative for hematuria.   Skin: Negative.   Neurological:  Negative for dizziness, headaches Jenna light-headedness.  Hematological:  Does not bruise/bleed easily.     PAST MEDICAL/SURGICAL HISTORY:  Past Medical  History:  Diagnosis Date   Cataract    DDD (degenerative disc disease)    Fibromyalgia    Herpes zoster    History of skin cancer    Hyperlipidemia    Hypertension    Hypothyroidism    Iron deficiency anemia due to chronic blood loss 04/06/2018   OA (osteoarthritis)    OSA on CPAP 01/16/2018   Mild OSA overall with an AHI of 5.8/h but during REM sleep moderate with an AHI of 28/h.  Oxygen saturations dropped to 76% during respiratory events.  Now on CPAP at 9 cm H2O    Osteoporosis    Palpitations    Pre-diabetes    PUD (peptic ulcer disease)    Skin cancer of nose    [name]   Past Surgical History:  Procedure Laterality Date   ABDOMINAL HYSTERECTOMY     partial   BIOPSY  04/24/2018   Procedure: BIOPSY;  Surgeon: Rogene Houston, MD;  [facility]: [facility];  Service: Endoscopy;;  gastric body ulcerated surface   BREAST BIOPSY     Right   CATARACT EXTRACTION W/PHACO  10/28/2011   Procedure: CATARACT EXTRACTION PHACO Jenna INTRAOCULAR LENS PLACEMENT ([place]);  Surgeon: Tonny Branch, MD;  Location: [facility];  Service: Ophthalmology;  Laterality: Right;  CDE 18.38   CATARACT EXTRACTION W/PHACO  02/14/2012   Procedure: CATARACT EXTRACTION PHACO Jenna INTRAOCULAR LENS PLACEMENT ([facility]);  Surgeon: Tonny Branch, MD;  Location: [facility];  Service: Ophthalmology;  Laterality: Left;  CDE 17.20   CESAREAN SECTION     COLONOSCOPY N/A 05/31/2013   Procedure: COLONOSCOPY;  Surgeon: Rogene Houston, MD;  Location: [facility];  Service: Endoscopy;  Laterality: N/A;  240   COLONOSCOPY N/A 04/24/2018   Procedure: COLONOSCOPY;  Surgeon: Rogene Houston, MD;  [facility];  Service: Endoscopy;  Laterality: N/A;  9:30   ESOPHAGOGASTRODUODENOSCOPY N/A 04/24/2018   Procedure: ESOPHAGOGASTRODUODENOSCOPY (EGD);  Surgeon: Rogene Houston, MD;  [facility];  Service: Endoscopy;  Laterality: N/A;   EYE SURGERY     skin removal    FRACTURE SURGERY  1952   fractured right arm   LEFT HEART  CATH Jenna CORONARY ANGIOGRAPHY N/A 09/25/2018   Procedure: LEFT HEART CATH Jenna CORONARY ANGIOGRAPHY;  Surgeon: Jettie Booze, MD;  Location: [facility];  Service: Cardiovascular;  Laterality: N/A;     SOCIAL HISTORY:  Social History   Socioeconomic History   Marital status: Married    Spouse name: [name]   Number of children: 1   Years of education: 9   Highest education level: 9th grade  Occupational History   Occupation: Retired    Comment: Therapist, art  Tobacco Use   Smoking status: Never   Smokeless tobacco: Never  Scientific laboratory technician Use: Never used  Substance Jenna Sexual Activity   Alcohol use: No   Drug use: No   Sexual activity: Not Currently    Birth control/protection: Surgical  Other Topics Concern   Not on file  Social History Narrative   Married   Lives in a one story home    Social Determinants of Health   Financial Resource Strain: Not on file  Food Insecurity: Not on file  Transportation Needs: Not on file  Physical Activity: Not on file  Stress: Not on file  Social Connections: Not on file  Intimate Partner Violence: Not on file    FAMILY HISTORY:  Family History  Problem Relation Age of Onset   CVA Mother    Stroke Mother    Osteoporosis Mother    Cancer Father        prostate   Bronchitis Sister    COPD Sister    Early death Brother        died in MVA   Carpal tunnel syndrome Sister    Cancer Brother        throat   Cancer Brother        lung   Cancer Brother        throat   Stroke Brother    Heart attack Brother    Gout Brother    Heart Problems Brother        stents   CAD Brother    Hypertension Brother    Gout Brother    Arthritis Brother    Diabetes Son    Coronary artery disease Neg Hx        No premature   Anesthesia problems Neg Hx    Hypotension Neg Hx    Malignant hyperthermia Neg Hx    Pseudochol deficiency Neg Hx     CURRENT MEDICATIONS:  Outpatient Encounter Medications as  of 12/22/2020  Medication Sig   acetaminophen (TYLENOL) 500 MG tablet Take 500 mg by mouth daily as needed for moderate pain or headache.    Cholecalciferol (VITAMIN D3) 2000 UNITS TABS Take 2,000 Units by mouth daily.   colchicine 0.6 MG tablet TAKE 1 TABLET DAILY AS NEEDED FOR GOUT FLARE UP   furosemide (LASIX) 40 MG tablet Take 40 mg by mouth as needed.    hydrocortisone (ANUSOL-HC) 2.5 %  rectal cream APPLY RECTALLY DAILY AS NEEDED FOR HEMMORHOIDS OR ITCHING.   levothyroxine (SYNTHROID) 75 MCG tablet Take 1 tablet (75 mcg total) by mouth daily.   metoprolol succinate (TOPROL-XL) 50 MG 24 hr tablet TAKE 1 TABLET ONCE A DAY WITH OR IMMEDIATELY FOLLOWING A MEAL.   nitroGLYCERIN (NITROSTAT) 0.4 MG SL tablet Jenna [name] 1 TAB UNDER TONGUE EVERY 3 MINUTES AS DIRECTED UP TO 3 TIMES AS NEEDED FOR CHEST PAIN.   pantoprazole (PROTONIX) 40 MG tablet TAKE 1 TABLET BY MOUTH DAILY BEFORE BREAKFAST.   rivaroxaban (XARELTO) 20 MG TABS tablet TAKE 1 TABLET BY MOUTH DAILY WITH SUPPER.   Facility-Administered Encounter Medications as of 12/22/2020  Medication   cyanocobalamin ((VITAMIN B-12)) injection 1,000 mcg    ALLERGIES:  Allergies  Allergen Reactions   Daypro [Oxaprozin]     Unknown reaction   Diclofenac Sodium     Unknown reaction   Effexor [Venlafaxine Hydrochloride]     Unknown reaction   Lidocaine-Prilocaine    Prozac [Fluoxetine Hcl]     Unknown reaction   Amitriptyline Rash   Cymbalta [Duloxetine Hcl] Nausea Only   Duloxetine Nausea Only   Lipitor [Atorvastatin Calcium] Other (See Comments)    Leg aches   Penicillins Rash    Has patient had a PCN reaction causing immediate rash, facial/tongue/throat swelling, SOB or lightheadedness with hypotension: Yes Has patient had a PCN reaction causing severe rash involving mucus membranes or skin necrosis: No Has patient had a PCN reaction that required hospitalization: No Has patient had a PCN reaction occurring within the last 10 years: No If all  of the above answers are "NO", then may proceed with Cephalosporin use.    Savella [Milnacipran Hcl] Rash   Sulfa Antibiotics Rash   Zocor [Simvastatin] Other (See Comments)    Hip pain     PHYSICAL EXAM:  ECOG PERFORMANCE STATUS: 1 - Symptomatic but completely ambulatory  There were no vitals filed for this visit. There were no vitals filed for this visit. Physical Exam Constitutional:      Appearance: Normal appearance.  HENT:     Head: Normocephalic Jenna atraumatic.     Mouth/Throat:     Mouth: Mucous membranes are moist.  Eyes:     Extraocular Movements: Extraocular movements intact.     Pupils: Pupils are equal, round, Jenna reactive to light.  Cardiovascular:     Rate Jenna Rhythm: Normal rate Jenna regular rhythm.     Pulses: Normal pulses.     Heart sounds: Normal heart sounds.  Pulmonary:     Effort: Pulmonary effort is normal.     Breath sounds: Normal breath sounds.  Abdominal:     General: Bowel sounds are normal.     Palpations: Abdomen is soft.     Tenderness: There is no abdominal tenderness.  Musculoskeletal:        General: No swelling.     Right lower leg: Edema (trace ankle edema) present.     Left lower leg: Edema (trace ankle edema) present.  Lymphadenopathy:     Cervical: No cervical adenopathy.  Skin:    General: Skin is warm Jenna dry.  Neurological:     General: No focal deficit present.     Mental Status: Jenna [name], Jenna [name], Jenna [name].  Psychiatric:        Mood Jenna Affect: Mood normal.        Behavior: Behavior normal.     LABORATORY DATA:  I have reviewed  the labs as listed.  CBC    Component Value Date/[name]   WBC 5.2 12/15/2020 1327   RBC 3.91 12/15/2020 1327   HGB 13.2 12/15/2020 1327   HGB 13.2 08/21/2020 1515   HCT 37.9 12/15/2020 1327   HCT 38.2 08/21/2020 1515   PLT 158 12/15/2020 1327   PLT 173 08/21/2020 1515   MCV 96.9 12/15/2020 1327   MCV 97 08/21/2020 1515   MCH 33.8 12/15/2020 1327   MCHC 34.8  12/15/2020 1327   RDW 12.5 12/15/2020 1327   RDW 12.1 08/21/2020 1515   LYMPHSABS 1.0 12/15/2020 1327   LYMPHSABS 1.3 08/21/2020 1515   MONOABS 0.5 12/15/2020 1327   EOSABS 0.1 12/15/2020 1327   EOSABS 0.1 08/21/2020 1515   BASOSABS 0.0 12/15/2020 1327   BASOSABS 0.0 08/21/2020 1515   CMP Latest Ref Rng & Units 09/04/2020 06/16/2020 05/22/2020  Glucose 70 - 99 mg/dL 145(H) 156(H) 97  BUN 8 - 23 mg/dL '14 12 11  '$ Creatinine 0.44 - 1.00 mg/dL 0.73 0.68 0.61  Sodium 135 - 145 mmol/L 138 133(L) 140  Potassium 3.5 - 5.1 mmol/L 4.7 4.1 4.1  Chloride 98 - 111 mmol/L 100 97(L) 99  CO2 22 - 32 mmol/L '29 27 26  '$ Calcium 8.9 - 10.3 mg/dL 9.1 8.8(L) 9.0  Total Protein 6.5 - 8.1 g/dL 6.5 6.3(L) -  Total Bilirubin 0.3 - 1.2 mg/dL 0.8 0.8 -  Alkaline Phos 38 - 126 U/L 90 85 -  AST 15 - 41 U/L 18 43(H) -  ALT 0 - 44 U/L 15 35 -    DIAGNOSTIC IMAGING:  I have independently reviewed the relevant imaging Jenna discussed with the patient.  ASSESSMENT & PLAN: 1.  Iron deficiency anemia - Initial evaluation on 03/15/2018 showed Hgb 8.9, platelets 267, ferritin 12 - EGD Jenna colonoscopy on 04/24/2018 showed single gastric polyp with ulcerated surface, external hemorrhoids Jenna diverticulosis - Last Feraheme on 02/29/2020 Jenna 03/07/2020 - Patient continues to have fatigue Jenna low energy.  Jenna [name] denies any bright red blood per rectum or melena. - Most recent labs (12/15/2020): Hgb 13.2, ferritin 91, iron saturation 25% - PLAN:  No indication for IV iron at this [name].  RTC in 6 months for repeat labs Jenna follow-up, or sooner if needed based on patient's symptoms.  2.  B12 deficiency - Receives monthly B12 injections from Jenna [name] PCP (Dr. Warrick Parisian) - Most recent B12 (12/15/2020): Normal B12 at 584 - PLAN: Continue monthly B12 injections.  PLAN SUMMARY & DISPOSITION: -RTC in 6 months with labs the week before  All questions were answered. The patient knows to call the clinic with any problems, questions or  concerns.  Medical decision making: Low  [name] spent on visit: I spent 15 minutes counseling the patient face to face. The total [name] spent in the appointment was 25 minutes Jenna more than 50% was on counseling.   Harriett Rush, PA-C  12/22/2020 9:31 AM

## 2020-12-22 ENCOUNTER — Ambulatory Visit (HOSPITAL_COMMUNITY): Payer: Medicare Other | Admitting: Hematology

## 2020-12-22 ENCOUNTER — Inpatient Hospital Stay (HOSPITAL_COMMUNITY): Payer: Medicare Other | Attending: Hematology | Admitting: Physician Assistant

## 2020-12-22 ENCOUNTER — Other Ambulatory Visit: Payer: Self-pay

## 2020-12-22 ENCOUNTER — Encounter (HOSPITAL_COMMUNITY): Payer: Self-pay | Admitting: Physician Assistant

## 2020-12-22 VITALS — BP 148/61 | HR 73 | Temp 96.8°F | Resp 18 | Wt 205.7 lb

## 2020-12-22 DIAGNOSIS — D500 Iron deficiency anemia secondary to blood loss (chronic): Secondary | ICD-10-CM

## 2020-12-22 DIAGNOSIS — M810 Age-related osteoporosis without current pathological fracture: Secondary | ICD-10-CM | POA: Insufficient documentation

## 2020-12-22 DIAGNOSIS — D509 Iron deficiency anemia, unspecified: Secondary | ICD-10-CM | POA: Insufficient documentation

## 2020-12-22 DIAGNOSIS — E039 Hypothyroidism, unspecified: Secondary | ICD-10-CM | POA: Insufficient documentation

## 2020-12-22 DIAGNOSIS — E538 Deficiency of other specified B group vitamins: Secondary | ICD-10-CM | POA: Diagnosis not present

## 2020-12-22 DIAGNOSIS — I10 Essential (primary) hypertension: Secondary | ICD-10-CM | POA: Insufficient documentation

## 2020-12-22 NOTE — Patient Instructions (Signed)
[place] at [facility] Discharge Instructions  You were seen today by Tarri Abernethy PA-C for your iron deficiency anemia.  Your labs today look great!  Your hemoglobin was normal at 13.2.  Your iron also was normal, with ferritin 91 and iron saturation 25%.  Your B12 level is also normal at 584.  There is no need for you to receive IV iron at this time.  Continue to eat a diet high in iron rich foods.  Continue to receive your vitamin B12 injections from Dr. Warrick Parisian.  We will schedule you for follow-up visit in 6 months and we will check your labs at that time.  HOWEVER, if for any reason you feel that you need a lab check or office visit sooner than that, please call our office and we will work you into the schedule.  LABS: Return in 6 months for repeat labs  OTHER TESTS: No other tests at this time  MEDICATIONS: No changes to home medications  FOLLOW-UP APPOINTMENT: Office visit in 6 months   Thank you for choosing [place] at [facility] to provide your oncology and hematology care.  To afford each patient quality time with our provider, please arrive at least 15 minutes before your scheduled appointment time.   If you have a lab appointment with the [facility] please come in thru the Main Entrance and check in at the main information desk.  You need to re-schedule your appointment should you arrive 10 or more minutes late.  We strive to give you quality time with our providers, and arriving late affects you and other patients whose appointments are after yours.  Also, if you no show three or more times for appointments you may be dismissed from the clinic at the providers discretion.     Again, thank you for choosing [facility].  Our hope is that these requests will decrease the amount of time that you wait before being seen by our physicians.        _____________________________________________________________  Should you have questions after your visit to [facility], please contact our office at [phone] and follow the prompts.  Our office hours are 8:00 a.m. and 4:30 p.m. Monday - Friday.  Please note that voicemails left after 4:00 p.m. may not be returned until the following business day.  We are closed weekends and major holidays.  You do have access to a nurse 24-7, just call the main number to the clinic [phone] and do not press any options, hold on the line and a nurse will answer the phone.    For prescription refill requests, have your pharmacy contact our office and allow 72 hours.    Due to Covid, you will need to wear a mask upon entering the hospital. If you do not have a mask, a mask will be given to you at the Main Entrance upon arrival. For doctor visits, patients may have 1 support person age 69 or older with them. For treatment visits, patients can not have anyone with them due to social distancing guidelines and our immunocompromised population.

## 2021-01-12 ENCOUNTER — Other Ambulatory Visit: Payer: Self-pay

## 2021-01-12 ENCOUNTER — Ambulatory Visit (INDEPENDENT_AMBULATORY_CARE_PROVIDER_SITE_OTHER): Payer: Medicare Other

## 2021-01-12 DIAGNOSIS — E538 Deficiency of other specified B group vitamins: Secondary | ICD-10-CM

## 2021-01-12 NOTE — Progress Notes (Signed)
Cyanocobalamin injection given to right deltoid.  Patient tolerated well. 

## 2021-01-22 ENCOUNTER — Encounter (INDEPENDENT_AMBULATORY_CARE_PROVIDER_SITE_OTHER): Payer: Self-pay | Admitting: Gastroenterology

## 2021-01-22 ENCOUNTER — Other Ambulatory Visit: Payer: Self-pay

## 2021-01-22 ENCOUNTER — Ambulatory Visit (INDEPENDENT_AMBULATORY_CARE_PROVIDER_SITE_OTHER): Payer: Medicare Other | Admitting: Gastroenterology

## 2021-01-22 VITALS — BP 153/72 | HR 77 | Temp 98.6°F | Ht 66.0 in | Wt 205.0 lb

## 2021-01-22 DIAGNOSIS — K5900 Constipation, unspecified: Secondary | ICD-10-CM

## 2021-01-22 DIAGNOSIS — K219 Gastro-esophageal reflux disease without esophagitis: Secondary | ICD-10-CM | POA: Diagnosis not present

## 2021-01-22 DIAGNOSIS — D509 Iron deficiency anemia, unspecified: Secondary | ICD-10-CM

## 2021-01-22 NOTE — Progress Notes (Signed)
Referring Provider: Dettinger, Fransisca Kaufmann, MD Primary Care Physician:  Dettinger, Fransisca Kaufmann, MD Primary GI Physician: Laural Golden  Chief Complaint  Patient presents with   Gastroesophageal Reflux    1 year follow up. Pt states she has constipation unless she drinks prune juice, drinks prune juice daily, has one bm a day, appetite is good, has trouble swallowing water,    HPI:   Jenna [name] is a 85 y.o. female with past medical history of  a fib on xarelto, HTN, sleep apnea, hypothyroidism, B12 deficiency.   Patient presenting today for follow up of GERD, IDA and reports of constipation.  GERD: protonix '40mg'$  once daily, she is doing well with this, denies any symptoms of reflux. She occasionally has some issues feeling like water does not go down well, sometimes feels as though there is an air bubble stopping it, however, she is having no issues with food or pills getting stuck, and also reports she never has issues with tea or coffee not going down well.   IDA: most recent hgb December 15 2020 was 13.2, iron studies were WNL at that time. No recent iron infusions, she denies melena, or rectal bleeding.   Constipation: she reports that she has 1-2 BMs per day as long as she is drinking her prune juice. If she does not drink prune juice, she will be constipated. She endorses some gas at times depending on what she is eating. She feels that she feels like her BMs are sufficient.   She does endorse some bloating of her belly, states that she sometimes has a good appetite and other times she isn't very hungry. She denies any recent weight loss. No NSAID use  Last Colonoscopy:04/24/18- The examined portion of the ileum was normal. - Diverticulosis in the sigmoid colon. - External hemorrhoids. - Anal papilla(e) were hypertrophied. - No specimens collected.  Last Endoscopy:04/24/18- Normal esophagus. - Z-line irregular, 41 cm from the incisors. - A single gastric polyp with ulcerated  surface.reactive gastropathy, Clip (MR conditional) was placed. - Normal duodenal bulb and second portion of the duodenum.  Family history: no familia hx of CRC, brothers had throat cancer Social history: no alcohol, tobacco or illicit drug use  Recommendations:  No repeat colonoscopy given lack of polyps and advanced age   Past Medical History:  Diagnosis Date   Cataract    DDD (degenerative disc disease)    Fibromyalgia    Herpes zoster    History of skin cancer    Hyperlipidemia    Hypertension    Hypothyroidism    Iron deficiency anemia due to chronic blood loss 04/06/2018   OA (osteoarthritis)    OSA on CPAP 01/16/2018   Mild OSA overall with an AHI of 5.8/h but during REM sleep moderate with an AHI of 28/h.  Oxygen saturations dropped to 76% during respiratory events.  Now on CPAP at 9 cm H2O    Osteoporosis    Palpitations    Pre-diabetes    PUD (peptic ulcer disease)    Skin cancer of nose    Jenna [name]    Past Surgical History:  Procedure Laterality Date   ABDOMINAL HYSTERECTOMY     partial   BIOPSY  04/24/2018   Procedure: BIOPSY;  Surgeon: Rogene Houston, MD;  [facility]: [facility];  Service: Endoscopy;;  gastric body ulcerated surface   BREAST BIOPSY     Right   CATARACT EXTRACTION W/PHACO  10/28/2011   Procedure: CATARACT EXTRACTION PHACO AND  INTRAOCULAR LENS PLACEMENT (IOC);  Surgeon: Tonny Branch, MD;  Location: [facility];  Service: Ophthalmology;  Laterality: Right;  CDE 18.38   CATARACT EXTRACTION W/PHACO  02/14/2012   Procedure: CATARACT EXTRACTION PHACO AND INTRAOCULAR LENS PLACEMENT (IOC);  Surgeon: Tonny Branch, MD;  Location: [facility];  Service: Ophthalmology;  Laterality: Left;  CDE 17.20   CESAREAN SECTION     COLONOSCOPY N/A 05/31/2013   Procedure: COLONOSCOPY;  Surgeon: Rogene Houston, MD;  Location: [facility];  Service: Endoscopy;  Laterality: N/A;  240   COLONOSCOPY N/A 04/24/2018   Procedure: COLONOSCOPY;  Surgeon: Rogene Houston, MD;   Location: [facility];  Service: Endoscopy;  Laterality: N/A;  9:30   ESOPHAGOGASTRODUODENOSCOPY N/A 04/24/2018   Procedure: ESOPHAGOGASTRODUODENOSCOPY (EGD);  Surgeon: Rogene Houston, MD;  Location: [facility];  Service: Endoscopy;  Laterality: N/A;   EYE SURGERY     skin removal    FRACTURE SURGERY  1952   fractured right arm   LEFT HEART CATH AND CORONARY ANGIOGRAPHY N/A 09/25/2018   Procedure: LEFT HEART CATH AND CORONARY ANGIOGRAPHY;  Surgeon: Jettie Booze, MD;  Location: [facility];  Service: Cardiovascular;  Laterality: N/A;    Current Outpatient Medications  Medication Sig Dispense Refill   acetaminophen (TYLENOL) 500 MG tablet Take 500 mg by mouth daily as needed for moderate pain or headache.      Cholecalciferol (VITAMIN D3) 2000 UNITS TABS Take 2,000 Units by mouth daily.     colchicine 0.6 MG tablet TAKE 1 TABLET DAILY AS NEEDED FOR GOUT FLARE UP 30 tablet 5   furosemide (LASIX) 40 MG tablet Take 40 mg by mouth as needed.      hydrocortisone (ANUSOL-HC) 2.5 % rectal cream APPLY RECTALLY DAILY AS NEEDED FOR HEMMORHOIDS OR ITCHING. 30 g 0   levothyroxine (SYNTHROID) 75 MCG tablet Take 1 tablet (75 mcg total) by mouth daily. 90 tablet 3   metoprolol succinate (TOPROL-XL) 50 MG 24 hr tablet TAKE 1 TABLET ONCE A DAY WITH OR IMMEDIATELY FOLLOWING A MEAL. 30 tablet 5   nitroGLYCERIN (NITROSTAT) 0.4 MG SL tablet PLACE 1 TAB UNDER TONGUE EVERY 3 MINUTES AS DIRECTED UP TO 3 TIMES AS NEEDED FOR CHEST PAIN. 25 tablet 2   pantoprazole (PROTONIX) 40 MG tablet TAKE 1 TABLET BY MOUTH DAILY BEFORE BREAKFAST. 90 tablet 3   rivaroxaban (XARELTO) 20 MG TABS tablet TAKE 1 TABLET BY MOUTH DAILY WITH SUPPER. 90 tablet 3   Current Facility-Administered Medications  Medication Dose Route Frequency Provider Last Rate Last Admin   cyanocobalamin ((VITAMIN B-12)) injection 1,000 mcg  1,000 mcg Intramuscular Q30 days Dettinger, Fransisca Kaufmann, MD   1,000 mcg at 01/12/21 1431    Allergies  as of 01/22/2021 - Review Complete 01/22/2021  Allergen Reaction Noted   Daypro [oxaprozin]  09/06/2010   Diclofenac sodium  09/06/2010   Effexor [venlafaxine hydrochloride]  09/06/2010   Lidocaine-prilocaine  01/25/2020   Prozac [fluoxetine hcl]  09/06/2010   Amitriptyline Rash 09/06/2010   Cymbalta [duloxetine hcl] Nausea Only 09/06/2010   Duloxetine Nausea Only 09/06/2010   Lipitor [atorvastatin calcium] Other (See Comments) 09/06/2010   Penicillins Rash 09/06/2010   Savella [milnacipran hcl] Rash 02/14/2012   Sulfa antibiotics Rash 09/06/2010   Zocor [simvastatin] Other (See Comments) 09/06/2010    Family History  Problem Relation Age of Onset   CVA Mother    Stroke Mother    Osteoporosis Mother    Cancer Father  prostate   Bronchitis Sister    COPD Sister    Early death Brother        died in MVA   Carpal tunnel syndrome Sister    Cancer Brother        throat   Cancer Brother        lung   Cancer Brother        throat   Stroke Brother    Heart attack Brother    Gout Brother    Heart Problems Brother        stents   CAD Brother    Hypertension Brother    Gout Brother    Arthritis Brother    Diabetes Son    Coronary artery disease Neg Hx        No premature   Anesthesia problems Neg Hx    Hypotension Neg Hx    Malignant hyperthermia Neg Hx    Pseudochol deficiency Neg Hx     Social History   Socioeconomic History   Marital status: Married    Spouse name: Jenna [name]   Number of children: 1   Years of education: 9   Highest education level: 9th grade  Occupational History   Occupation: Retired    Comment: Therapist, art  Tobacco Use   Smoking status: Never   Smokeless tobacco: Never  Scientific laboratory technician Use: Never used  Substance and Sexual Activity   Alcohol use: No   Drug use: No   Sexual activity: Not Currently    Birth control/protection: Surgical  Other Topics Concern   Not on file  Social History Narrative    Married   Lives in a one story home    Social Determinants of Health   Financial Resource Strain: Not on file  Food Insecurity: Not on file  Transportation Needs: Not on file  Physical Activity: Not on file  Stress: Not on file  Social Connections: Not on file    Review of Systems: Gen: Denies fever, chills, anorexia. Denies fatigue, weakness, weight loss.  CV: Denies chest pain, palpitations, syncope, peripheral edema, and claudication. Resp: Denies dyspnea at rest, cough, wheezing, coughing up blood, and pleurisy. GI: Denies vomiting blood, jaundice, and fecal incontinence. Denies dysphagia or odynophagia. +constipation +bloating Derm: Denies rash, itching, dry skin Psych: Denies depression, anxiety, memory loss, confusion. No homicidal or suicidal ideation.  Heme: Denies bruising, bleeding, and enlarged lymph nodes.  Physical Exam: BP (!) 153/72 (BP Location: Right Arm, Patient Position: Sitting, Cuff Size: Large)   Pulse 77   Temp 98.6 F (37 C) (Oral)   Ht '5\' 6"'$  (1.676 m)   Wt 205 lb (93 kg)   BMI 33.09 kg/m  General:   Alert and oriented. No distress noted. Pleasant and cooperative.  Head:  Normocephalic and atraumatic. Eyes:  Conjuctiva clear without scleral icterus. Mouth:  Oral mucosa pink and moist. Good dentition. No lesions. Heart: Normal rate and rhythm, s1 and s2 heart sounds present.  Lungs: Clear lung sounds in all lobes. Respirations equal and unlabored. Abdomen:  +BS, soft,  and non-distended. No rebound or guarding. No HSM or masses noted. Mild TTP mid lower abdomen, she reports this has been chronic Derm: No palmar erythema or jaundice Msk:  Symmetrical without gross deformities. Normal posture. Extremities:  non pitting edema to BLLE, chronic Neurologic:  Alert and  oriented x4 Psych:  Alert and cooperative. Normal mood and affect.  ASSESSMENT: TARESSA KILIAN is a 85 y.o. female  presenting today for follow up of GERD and IDA with complaints of  constipation.    Jenna [name] well controlled on protonix '40mg'$  once daily, she is doing well with this, denies any symptoms of reflux. She denies any pill or food dysphagia, occasionally feels as though water gets stopped by an air bubble in her throat/chest, this does not happen with any other drinks such as her coffee or tea. Suspect she is drinking too fast, we discussed smaller and slower sips to avoid over intake of air when drinking.   most recent hgb December 15 2020 was 13.2, iron studies were WNL at that time. No currently receiving iron infusions at this time. she denies melena, or rectal bleeding.   Having 1-2 BMs per day as long as she is drinking her prune juice, feels that BMs are sufficient, has some gas at times and bloating of her belly, feels as though this is related to what she is eating (veggies, beans), usually improves with BMs, denies early satiety or postprandial pain. She denies any recent weight loss. No NSAID use. We discussed trying something such as miralax or linzess for constipation, however, patient would like to continue with prune juice at this time. She should let us know if this stops working or she would like to try something else.    No red flag symptoms. Patient denies melena, hematochezia, nausea, vomiting, diarrhea, dysphagia, odyonophagia, early satiety or weight loss.    PLAN:  1.continue protonix '40mg'$  daily 2. Sip water slowly 3. Take note of foods, especially certain veggies that cause more bloating 4. Can continue prune juice for constipation, let us know if this stops working or you would like to try something else    Follow Up: 1 year  Jenna Warman L. Alver Sorrow, MSN, APRN, AGNP-C Adult-Gerontology Nurse Practitioner [facility]

## 2021-01-22 NOTE — Patient Instructions (Addendum)
We will continue your protonix '40mg'$  daily Please be mindful to drink water slower You can continue prune juice for constipation, let us know if this stops working or you would like to try something else to help with your constipation Be mindful of certain veggies that can cause more bloating  Follow up in 1 year

## 2021-01-29 ENCOUNTER — Ambulatory Visit (INDEPENDENT_AMBULATORY_CARE_PROVIDER_SITE_OTHER): Payer: Medicare Other | Admitting: Gastroenterology

## 2021-02-01 DIAGNOSIS — I499 Cardiac arrhythmia, unspecified: Secondary | ICD-10-CM | POA: Diagnosis present

## 2021-02-01 DIAGNOSIS — I10 Essential (primary) hypertension: Secondary | ICD-10-CM | POA: Diagnosis present

## 2021-02-01 DIAGNOSIS — E861 Hypovolemia: Secondary | ICD-10-CM | POA: Diagnosis present

## 2021-02-01 DIAGNOSIS — E876 Hypokalemia: Secondary | ICD-10-CM | POA: Diagnosis present

## 2021-02-01 DIAGNOSIS — K5289 Other specified noninfective gastroenteritis and colitis: Secondary | ICD-10-CM | POA: Diagnosis present

## 2021-02-01 DIAGNOSIS — K5669 Other partial intestinal obstruction: Secondary | ICD-10-CM | POA: Diagnosis present

## 2021-02-01 DIAGNOSIS — E871 Hypo-osmolality and hyponatremia: Secondary | ICD-10-CM | POA: Diagnosis present

## 2021-02-01 DIAGNOSIS — A09 Infectious gastroenteritis and colitis, unspecified: Secondary | ICD-10-CM | POA: Diagnosis present

## 2021-02-01 DIAGNOSIS — R1010 Upper abdominal pain, unspecified: Secondary | ICD-10-CM | POA: Diagnosis not present

## 2021-02-01 DIAGNOSIS — D638 Anemia in other chronic diseases classified elsewhere: Secondary | ICD-10-CM | POA: Diagnosis present

## 2021-02-01 DIAGNOSIS — K5720 Diverticulitis of large intestine with perforation and abscess without bleeding: Secondary | ICD-10-CM | POA: Diagnosis present

## 2021-02-01 DIAGNOSIS — B9620 Unspecified Escherichia coli [E. coli] as the cause of diseases classified elsewhere: Secondary | ICD-10-CM | POA: Diagnosis present

## 2021-02-01 DIAGNOSIS — N390 Urinary tract infection, site not specified: Secondary | ICD-10-CM | POA: Diagnosis present

## 2021-02-01 DIAGNOSIS — K567 Ileus, unspecified: Secondary | ICD-10-CM | POA: Diagnosis present

## 2021-02-05 ENCOUNTER — Telehealth: Payer: Self-pay

## 2021-02-05 NOTE — Telephone Encounter (Signed)
Transition Care Management Unsuccessful Follow-up Telephone Call  Date of discharge and from where:  [facility] [place] in [place] 02/04/21  Diagnosis:  unknown   Attempts:  1st Attempt  Reason for unsuccessful TCM follow-up call:  Left voice message

## 2021-02-10 NOTE — Telephone Encounter (Signed)
Transition Care Management Unsuccessful Follow-up Telephone Call   Date of discharge and from where:  [place] in [place] 02/04/21   Diagnosis:  unknown    Attempts:  2nd Attempt   Reason for unsuccessful TCM follow-up call:  Left voice message  Unable to reach for TCM visit  She has appt with Dr Warrick Parisian 02/25/21 - has cancelled several appts recently and this d/c was GA

## 2021-02-12 ENCOUNTER — Other Ambulatory Visit: Payer: Self-pay

## 2021-02-12 ENCOUNTER — Ambulatory Visit (INDEPENDENT_AMBULATORY_CARE_PROVIDER_SITE_OTHER): Payer: Medicare Other | Admitting: *Deleted

## 2021-02-12 DIAGNOSIS — E538 Deficiency of other specified B group vitamins: Secondary | ICD-10-CM | POA: Diagnosis not present

## 2021-02-12 NOTE — Progress Notes (Signed)
Pt in today for B12 injection in left deltoid. Pt tolerated well

## 2021-02-23 ENCOUNTER — Other Ambulatory Visit: Payer: Self-pay | Admitting: Family Medicine

## 2021-02-25 ENCOUNTER — Other Ambulatory Visit: Payer: Self-pay | Admitting: Family Medicine

## 2021-02-25 ENCOUNTER — Encounter: Payer: Self-pay | Admitting: Family Medicine

## 2021-02-25 ENCOUNTER — Other Ambulatory Visit: Payer: Self-pay

## 2021-02-25 ENCOUNTER — Ambulatory Visit (INDEPENDENT_AMBULATORY_CARE_PROVIDER_SITE_OTHER): Payer: Medicare Other | Admitting: Family Medicine

## 2021-02-25 VITALS — BP 157/61 | HR 64 | Ht 66.0 in | Wt 206.0 lb

## 2021-02-25 DIAGNOSIS — I480 Paroxysmal atrial fibrillation: Secondary | ICD-10-CM

## 2021-02-25 DIAGNOSIS — E1169 Type 2 diabetes mellitus with other specified complication: Secondary | ICD-10-CM

## 2021-02-25 DIAGNOSIS — E039 Hypothyroidism, unspecified: Secondary | ICD-10-CM | POA: Diagnosis not present

## 2021-02-25 DIAGNOSIS — Z789 Other specified health status: Secondary | ICD-10-CM

## 2021-02-25 DIAGNOSIS — E782 Mixed hyperlipidemia: Secondary | ICD-10-CM | POA: Diagnosis not present

## 2021-02-25 LAB — BAYER DCA HB A1C WAIVED: HB A1C (BAYER DCA - WAIVED): 6.1 % — ABNORMAL HIGH (ref 4.8–5.6)

## 2021-02-25 MED ORDER — RIVAROXABAN 20 MG PO TABS: 20.0000 mg | ORAL_TABLET | Freq: Every day | ORAL | 3 refills | Status: DC

## 2021-02-25 NOTE — Progress Notes (Signed)
BP (!) 157/61   Pulse 64   Ht 5' 6"  (1.676 m)   Wt 206 lb (93.4 kg)   SpO2 96%   BMI 33.25 kg/m    Subjective:   Patient ID: Jenna [name], female    DOB: 04/18/1936, 85 y.o.   MRN: [id number]  HPI: Jenna [name] is a 85 y.o. female presenting on 02/25/2021 for Medical Management of Chronic Issues, Diabetes, and Hypothyroidism   HPI Hypertension Patient is currently on furosemide and metoprolol, and their blood pressure today is 157/61, slightly elevated, she says she witnessed a man [place] at the beach and that got her nerves tore up a little bit, she will keep close eye on blood pressures. Patient denies any lightheadedness or dizziness. Patient denies headaches, blurred vision, chest pains, shortness of breath, or weakness. Denies any side effects from medication and is content with current medication.   Type 2 diabetes mellitus Patient comes in today for recheck of his diabetes. Patient has been currently taking diet controlled, A1c 6.1. Patient is not currently on an ACE inhibitor/ARB. Patient has not seen an ophthalmologist this year. Patient denies any issues with their feet. The symptom started onset as an adult hypothyroidism and hypertension ARE RELATED TO DM   Hypothyroidism recheck Patient is coming in for thyroid recheck today as well. They deny any issues with hair changes or heat or cold problems or diarrhea or constipation. They deny any chest pain or palpitations. They are currently on levothyroxine 75 micrograms   A. fib recheck Patient is currently on Xarelto for A. fib.  She denies any further bleeding episodes.  She still seeing her hematologist cardiologist as well.  She denies any palpitations or flutters or flareups of her A. fib.  Relevant past medical, surgical, family and social history reviewed and updated as indicated. Interim medical history since our last visit reviewed. Allergies and medications reviewed and updated.  Review of Systems   Constitutional:  Negative for chills and fever.  Eyes:  Negative for visual disturbance.  Respiratory:  Negative for chest tightness and shortness of breath.   Cardiovascular:  Negative for chest pain and leg swelling.  Gastrointestinal:  Negative for anal bleeding and blood in stool.  Genitourinary:  Negative for difficulty urinating and dysuria.  Musculoskeletal:  Negative for back pain and gait problem.  Skin:  Negative for rash.  Neurological:  Negative for light-headedness and headaches.  Psychiatric/Behavioral:  Negative for agitation and behavioral problems.   All other systems reviewed and are negative.  Per HPI unless specifically indicated above   Allergies as of 02/25/2021       Reactions   Daypro [oxaprozin]    Unknown reaction   Diclofenac Sodium    Unknown reaction   Effexor [venlafaxine Hydrochloride]    Unknown reaction   Lidocaine-prilocaine    Prozac [fluoxetine Hcl]    Unknown reaction   Amitriptyline Rash   Cymbalta [duloxetine Hcl] Nausea Only   Duloxetine Nausea Only   Lipitor [atorvastatin Calcium] Other (See Comments)   Leg aches   Penicillins Rash   Has patient had a PCN reaction causing immediate rash, facial/tongue/throat swelling, SOB or lightheadedness with hypotension: Yes Has patient had a PCN reaction causing severe rash involving mucus membranes or skin necrosis: No Has patient had a PCN reaction that required hospitalization: No Has patient had a PCN reaction occurring within the last 10 years: No If all of the above answers are "NO", then may proceed with Cephalosporin  use.   Savella [milnacipran Hcl] Rash   Sulfa Antibiotics Rash   Zocor [simvastatin] Other (See Comments)   Hip pain        Medication List        Accurate as of February 25, 2021  9:31 AM. If you have any questions, ask your nurse or doctor.          acetaminophen 500 MG tablet Commonly known as: TYLENOL Take 500 mg by mouth daily as needed for moderate pain  or headache.   colchicine 0.6 MG tablet TAKE 1 TABLET DAILY AS NEEDED FOR GOUT FLARE UP   furosemide 40 MG tablet Commonly known as: LASIX Take 40 mg by mouth as needed.   hydrocortisone 2.5 % rectal cream Commonly known as: ANUSOL-HC APPLY RECTALLY DAILY AS NEEDED FOR HEMMORHOIDS OR ITCHING.   levothyroxine 75 MCG tablet Commonly known as: SYNTHROID Take 1 tablet (75 mcg total) by mouth daily.   metoprolol succinate 50 MG 24 hr tablet Commonly known as: TOPROL-XL TAKE 1 TABLET ONCE A DAY WITH OR IMMEDIATELY FOLLOWING A MEAL.   nitroGLYCERIN 0.4 MG SL tablet Commonly known as: NITROSTAT PLACE 1 TAB UNDER TONGUE EVERY 3 MINUTES AS DIRECTED UP TO 3 TIMES AS NEEDED FOR CHEST PAIN.   pantoprazole 40 MG tablet Commonly known as: PROTONIX TAKE 1 TABLET BY MOUTH DAILY BEFORE BREAKFAST.   rivaroxaban 20 MG Tabs tablet Commonly known as: Xarelto Take 1 tablet (20 mg total) by mouth daily with supper. What changed:  how much to take how to take this when to take this additional instructions Changed by: Worthy Rancher, MD   Vitamin D3 50 MCG (2000 UT) Tabs Take 2,000 Units by mouth daily.         Objective:   BP (!) 157/61   Pulse 64   Ht 5' 6"  (1.676 m)   Wt 206 lb (93.4 kg)   SpO2 96%   BMI 33.25 kg/m   Wt Readings from Last 3 Encounters:  02/25/21 206 lb (93.4 kg)  01/22/21 205 lb (93 kg)  12/22/20 205 lb 11.2 oz (93.3 kg)    Physical Exam Vitals and nursing note reviewed.  Constitutional:      General: She is not in acute distress.    Appearance: She is well-developed. She is not diaphoretic.  Eyes:     Conjunctiva/sclera: Conjunctivae normal.  Cardiovascular:     Rate and Rhythm: Normal rate and regular rhythm.     Pulses: Normal pulses.     Heart sounds: Normal heart sounds. No murmur heard. Pulmonary:     Effort: Pulmonary effort is normal. No respiratory distress.     Breath sounds: Normal breath sounds. No wheezing.  Musculoskeletal:         General: No tenderness. Normal range of motion.  Skin:    General: Skin is warm and dry.     Findings: No rash.  Neurological:     Mental Status: She is alert and oriented to person, place, and time.     Coordination: Coordination normal.  Psychiatric:        Behavior: Behavior normal.      Assessment & Plan:   Problem List Items Addressed This Visit       Cardiovascular and Mediastinum   AF (paroxysmal atrial fibrillation) (HCC)   Relevant Medications   rivaroxaban (XARELTO) 20 MG TABS tablet     Endocrine   Hypothyroidism   Relevant Orders   CBC with Differential/Platelet   CMP14+EGFR  Lipid panel   TSH   Bayer DCA Hb A1c Waived   Type 2 diabetes mellitus with other specified complication (HCC) - Primary   Relevant Orders   CBC with Differential/Platelet   CMP14+EGFR   Lipid panel   TSH   Bayer DCA Hb A1c Waived     Other   Statin intolerance   Hyperlipidemia   Relevant Medications   rivaroxaban (XARELTO) 20 MG TABS tablet   Other Relevant Orders   CBC with Differential/Platelet   CMP14+EGFR   Lipid panel   TSH   Bayer DCA Hb A1c Waived    A1c looks good at 6.1, continue current medicine.  Blood pressure slightly elevated but she witnessed a man die last week at the beach so she is going to monitor closely.  She just says her nerves are torn up over the past few days. Follow up plan: Return in about 3 months (around 05/28/2021), or if symptoms worsen or fail to improve, for Diabetes and hypertension and thyroid and A. fib.  Counseling provided for all of the vaccine components Orders Placed This Encounter  Procedures   CBC with Differential/Platelet   CMP14+EGFR   Lipid panel   TSH   Bayer DCA Hb A1c [place], MD [facility] 02/25/2021, 9:31 AM

## 2021-02-26 LAB — CBC WITH DIFFERENTIAL/PLATELET
Basophils Absolute: 0 10*3/uL (ref 0.0–0.2)
Basos: 1 %
EOS (ABSOLUTE): 0.1 10*3/uL (ref 0.0–0.4)
Eos: 2 %
Hematocrit: 39.1 % (ref 34.0–46.6)
Hemoglobin: 13.5 g/dL (ref 11.1–15.9)
Immature Grans (Abs): 0 10*3/uL (ref 0.0–0.1)
Immature Granulocytes: 0 %
Lymphocytes Absolute: 0.9 10*3/uL (ref 0.7–3.1)
Lymphs: 17 %
MCH: 32.2 pg (ref 26.6–33.0)
MCHC: 34.5 g/dL (ref 31.5–35.7)
MCV: 93 fL (ref 79–97)
Monocytes Absolute: 0.5 10*3/uL (ref 0.1–0.9)
Monocytes: 9 %
Neutrophils Absolute: 4 10*3/uL (ref 1.4–7.0)
Neutrophils: 71 %
Platelets: 174 10*3/uL (ref 150–450)
RBC: 4.19 x10E6/uL (ref 3.77–5.28)
RDW: 12.1 % (ref 11.7–15.4)
WBC: 5.5 10*3/uL (ref 3.4–10.8)

## 2021-02-26 LAB — CMP14+EGFR
ALT: 8 IU/L (ref 0–32)
AST: 14 IU/L (ref 0–40)
Albumin/Globulin Ratio: 2.4 — ABNORMAL HIGH (ref 1.2–2.2)
Albumin: 4.3 g/dL (ref 3.6–4.6)
Alkaline Phosphatase: 94 IU/L (ref 44–121)
BUN/Creatinine Ratio: 15 (ref 12–28)
BUN: 10 mg/dL (ref 8–27)
Bilirubin Total: 0.6 mg/dL (ref 0.0–1.2)
CO2: 26 mmol/L (ref 20–29)
Calcium: 9.2 mg/dL (ref 8.7–10.3)
Chloride: 99 mmol/L (ref 96–106)
Creatinine, Ser: 0.66 mg/dL (ref 0.57–1.00)
Globulin, Total: 1.8 g/dL (ref 1.5–4.5)
Glucose: 121 mg/dL — ABNORMAL HIGH (ref 70–99)
Potassium: 4.7 mmol/L (ref 3.5–5.2)
Sodium: 139 mmol/L (ref 134–144)
Total Protein: 6.1 g/dL (ref 6.0–8.5)
eGFR: 87 mL/min/{1.73_m2} (ref >59)

## 2021-02-26 LAB — LIPID PANEL
Chol/HDL Ratio: 3.7 ratio (ref 0.0–4.4)
Cholesterol, Total: 206 mg/dL — ABNORMAL HIGH (ref 100–199)
HDL: 56 mg/dL (ref >39)
LDL Chol Calc (NIH): 121 mg/dL — ABNORMAL HIGH (ref 0–99)
Triglycerides: 165 mg/dL — ABNORMAL HIGH (ref 0–149)
VLDL Cholesterol Cal: 29 mg/dL (ref 5–40)

## 2021-02-26 LAB — TSH: TSH: 3.39 u[IU]/mL (ref 0.450–4.500)

## 2021-03-03 ENCOUNTER — Other Ambulatory Visit: Payer: Self-pay

## 2021-03-03 MED ORDER — PRAVASTATIN SODIUM 20 MG PO TABS: 20.0000 mg | ORAL_TABLET | Freq: Every day | ORAL | 1 refills | Status: DC

## 2021-03-16 ENCOUNTER — Ambulatory Visit (INDEPENDENT_AMBULATORY_CARE_PROVIDER_SITE_OTHER): Payer: Medicare Other

## 2021-03-16 ENCOUNTER — Other Ambulatory Visit: Payer: Self-pay

## 2021-03-16 DIAGNOSIS — E538 Deficiency of other specified B group vitamins: Secondary | ICD-10-CM

## 2021-03-18 ENCOUNTER — Telehealth: Payer: Self-pay

## 2021-03-18 NOTE — Telephone Encounter (Signed)
Received BP readings from pt. Per Dettinger the readings are borderline elevated. He advised pt to decrease salt intake in diet. Left message making pt aware. Sent readings to be scanned.

## 2021-03-31 DIAGNOSIS — R051 Acute cough: Secondary | ICD-10-CM | POA: Diagnosis not present

## 2021-03-31 DIAGNOSIS — J329 Chronic sinusitis, unspecified: Secondary | ICD-10-CM | POA: Diagnosis not present

## 2021-04-09 ENCOUNTER — Telehealth: Payer: Self-pay | Admitting: Family Medicine

## 2021-04-09 NOTE — Telephone Encounter (Signed)
LMTCB

## 2021-04-09 NOTE — Telephone Encounter (Signed)
Pt calling back about sinus infection and tightness in chest. Please call back.

## 2021-04-09 NOTE — Telephone Encounter (Signed)
Patient given appointment for 11/18 @ 4:05 with [facility]

## 2021-04-09 NOTE — Telephone Encounter (Signed)
Appointment given for 11/18 @ 4:05 with Hendricks Limes, FNP.

## 2021-04-10 ENCOUNTER — Encounter: Payer: Self-pay | Admitting: Family Medicine

## 2021-04-10 ENCOUNTER — Ambulatory Visit (INDEPENDENT_AMBULATORY_CARE_PROVIDER_SITE_OTHER): Payer: Medicare Other | Admitting: Family Medicine

## 2021-04-10 VITALS — BP 157/71 | HR 89

## 2021-04-10 DIAGNOSIS — N3000 Acute cystitis without hematuria: Secondary | ICD-10-CM

## 2021-04-10 DIAGNOSIS — R0789 Other chest pain: Secondary | ICD-10-CM | POA: Diagnosis not present

## 2021-04-10 DIAGNOSIS — R1030 Lower abdominal pain, unspecified: Secondary | ICD-10-CM | POA: Diagnosis not present

## 2021-04-10 DIAGNOSIS — E1169 Type 2 diabetes mellitus with other specified complication: Secondary | ICD-10-CM | POA: Diagnosis not present

## 2021-04-10 MED ORDER — ALBUTEROL SULFATE HFA 108 (90 BASE) MCG/ACT IN AERS: 2.0000 | INHALATION_SPRAY | Freq: Four times a day (QID) | RESPIRATORY_TRACT | 1 refills | Status: DC | PRN

## 2021-04-10 MED ORDER — CIPROFLOXACIN HCL 500 MG PO TABS: 500.0000 mg | ORAL_TABLET | Freq: Two times a day (BID) | ORAL | 0 refills | Status: AC

## 2021-04-10 NOTE — Progress Notes (Signed)
Assessment & Plan:  1. Chest tightness - albuterol (VENTOLIN HFA) 108 (90 Base) MCG/ACT inhaler; Inhale 2 puffs into the lungs every 6 (six) hours as needed.  Dispense: 18 g; Refill: 1  2. Lower abdominal pain - Urinalysis, Routine w reflex microscopic - Urine dipstick shows positive for protein, positive for leukocytes, and positive for ketones.  Micro exam: not done.  3. Acute cystitis without hematuria - ciprofloxacin (CIPRO) 500 MG tablet; Take 1 tablet (500 mg total) by mouth 2 (two) times daily for 7 days.  Dispense: 14 tablet; Refill: 0 - Urine Culture   Follow up plan: Return if symptoms worsen or fail to improve.  Hendricks Limes, MSN, APRN, FNP-C [facility]  Subjective:   Patient ID: Jenna [name], female    DOB: 08/05/1935, 85 y.o.   MRN: [id number]  HPI: Jenna [name] is a 85 y.o. female presenting on 04/10/2021 for Cough (Dry cough, chest tightness)  Patient complains of cough and chest congestion. She does have a slight wheeze at night per her report. Symptoms are what is left over from a recent respiratory infection that was treated with antibiotics and steroids. She is drinking plenty of fluids. She does not smoke. Patient has not been fully vaccinated against COVID-19.  She is also concerned about lower abdominal pain that started yesterday. She describes the pain as sharp. Denies dysuria, frequency, and urgency. She recalls similar symptoms in the past that landed her in the hospital with a kidney infection.    ROS: Negative unless specifically indicated above in HPI.   Relevant past medical history reviewed and updated as indicated.   Allergies and medications reviewed and updated.   Current Outpatient Medications:    acetaminophen (TYLENOL) 500 MG tablet, Take 500 mg by mouth daily as needed for moderate pain or headache. , Disp: , Rfl:    Cholecalciferol (VITAMIN D3) 2000 UNITS TABS, Take 2,000 Units by mouth daily., Disp: ,  Rfl:    colchicine 0.6 MG tablet, TAKE 1 TABLET DAILY AS NEEDED FOR GOUT FLARE UP, Disp: 30 tablet, Rfl: 5   furosemide (LASIX) 40 MG tablet, Take 40 mg by mouth as needed. , Disp: , Rfl:    hydrocortisone (ANUSOL-HC) 2.5 % rectal cream, APPLY RECTALLY DAILY AS NEEDED FOR HEMMORHOIDS OR ITCHING., Disp: 30 g, Rfl: 0   levothyroxine (SYNTHROID) 75 MCG tablet, Take 1 tablet (75 mcg total) by mouth daily., Disp: 90 tablet, Rfl: 3   metoprolol succinate (TOPROL-XL) 50 MG 24 hr tablet, TAKE 1 TABLET ONCE A DAY WITH OR IMMEDIATELY FOLLOWING A MEAL., Disp: 30 tablet, Rfl: 5   nitroGLYCERIN (NITROSTAT) 0.4 MG SL tablet, PLACE 1 TAB UNDER TONGUE EVERY 3 MINUTES AS DIRECTED UP TO 3 TIMES AS NEEDED FOR CHEST PAIN., Disp: 25 tablet, Rfl: 2   pantoprazole (PROTONIX) 40 MG tablet, TAKE 1 TABLET BY MOUTH DAILY BEFORE BREAKFAST., Disp: 90 tablet, Rfl: 3   pravastatin (PRAVACHOL) 20 MG tablet, Take 1 tablet (20 mg total) by mouth daily., Disp: 90 tablet, Rfl: 1   rivaroxaban (XARELTO) 20 MG TABS tablet, Take 1 tablet (20 mg total) by mouth daily with supper., Disp: 90 tablet, Rfl: 3  Current Facility-Administered Medications:    cyanocobalamin ((VITAMIN B-12)) injection 1,000 mcg, 1,000 mcg, Intramuscular, Q30 days, Dettinger, Fransisca Kaufmann, MD, 1,000 mcg at 03/16/21 1456  Allergies  Allergen Reactions   Daypro [Oxaprozin]     Unknown reaction   Diclofenac Sodium     Unknown reaction  Effexor [Venlafaxine Hydrochloride]     Unknown reaction   Lidocaine-Prilocaine    Prozac [Fluoxetine Hcl]     Unknown reaction   Amitriptyline Rash   Cymbalta [Duloxetine Hcl] Nausea Only   Duloxetine Nausea Only   Lipitor [Atorvastatin Calcium] Other (See Comments)    Leg aches   Penicillins Rash    Has patient had a PCN reaction causing immediate rash, facial/tongue/throat swelling, SOB or lightheadedness with hypotension: Yes Has patient had a PCN reaction causing severe rash involving mucus membranes or skin necrosis:  No Has patient had a PCN reaction that required hospitalization: No Has patient had a PCN reaction occurring within the last 10 years: No If all of the above answers are "NO", then may proceed with Cephalosporin use.    Savella [Milnacipran Hcl] Rash   Sulfa Antibiotics Rash   Zocor [Simvastatin] Other (See Comments)    Hip pain    Objective:   BP (!) 157/71   Pulse 89   SpO2 92%    Physical Exam Vitals reviewed.  Constitutional:      General: She is not in acute distress.    Appearance: Normal appearance. She is not ill-appearing, toxic-appearing or diaphoretic.  HENT:     Head: Normocephalic and atraumatic.     Right Ear: Tympanic membrane, ear canal and external ear normal. There is no impacted cerumen.     Left Ear: Tympanic membrane, ear canal and external ear normal. There is no impacted cerumen.     Mouth/Throat:     Mouth: Mucous membranes are moist.     Pharynx: Oropharynx is clear. No oropharyngeal exudate or posterior oropharyngeal erythema.  Eyes:     General: No scleral icterus.       Right eye: No discharge.        Left eye: No discharge.     Conjunctiva/sclera: Conjunctivae normal.  Cardiovascular:     Rate and Rhythm: Normal rate and regular rhythm.     Heart sounds: Normal heart sounds. No murmur heard.   No friction rub. No gallop.  Pulmonary:     Effort: Pulmonary effort is normal. No respiratory distress.     Breath sounds: Normal breath sounds. No stridor. No wheezing, rhonchi or rales.  Abdominal:     Palpations: There is no hepatomegaly or splenomegaly.     Tenderness: There is abdominal tenderness in the suprapubic area.  Musculoskeletal:        General: Normal range of motion.     Cervical back: Normal range of motion.  Skin:    General: Skin is warm and dry.     Capillary Refill: Capillary refill takes less than 2 seconds.  Neurological:     General: No focal deficit present.     Mental Status: She is alert and oriented to person, place,  and time. Mental status is at baseline.  Psychiatric:        Mood and Affect: Mood normal.        Behavior: Behavior normal.        Thought Content: Thought content normal.        Judgment: Judgment normal.

## 2021-04-13 LAB — URINALYSIS, ROUTINE W REFLEX MICROSCOPIC
Bilirubin, UA: NEGATIVE
Glucose, UA: NEGATIVE
Nitrite, UA: NEGATIVE
RBC, UA: NEGATIVE
Specific Gravity, UA: 1.015 (ref 1.005–1.030)
Urobilinogen, Ur: 1 mg/dL (ref 0.2–1.0)
pH, UA: 7.5 (ref 5.0–7.5)

## 2021-04-14 LAB — URINE CULTURE

## 2021-04-20 ENCOUNTER — Ambulatory Visit (INDEPENDENT_AMBULATORY_CARE_PROVIDER_SITE_OTHER): Payer: Medicare Other

## 2021-04-20 DIAGNOSIS — E538 Deficiency of other specified B group vitamins: Secondary | ICD-10-CM | POA: Diagnosis not present

## 2021-04-20 NOTE — Progress Notes (Signed)
Cyanocobalamin injection given to left deltoid.  Patient tolerated well. 

## 2021-04-30 ENCOUNTER — Ambulatory Visit (INDEPENDENT_AMBULATORY_CARE_PROVIDER_SITE_OTHER): Payer: Medicare Other | Admitting: Cardiology

## 2021-04-30 ENCOUNTER — Encounter: Payer: Self-pay | Admitting: Cardiology

## 2021-04-30 ENCOUNTER — Other Ambulatory Visit: Payer: Self-pay

## 2021-04-30 VITALS — BP 138/62 | HR 69 | Ht 66.0 in | Wt 206.6 lb

## 2021-04-30 DIAGNOSIS — I890 Lymphedema, not elsewhere classified: Secondary | ICD-10-CM | POA: Diagnosis not present

## 2021-04-30 DIAGNOSIS — I480 Paroxysmal atrial fibrillation: Secondary | ICD-10-CM | POA: Diagnosis not present

## 2021-04-30 NOTE — Progress Notes (Signed)
Cardiology Office Note  Date: 04/30/2021   ID: Jenna [name], Jenna [name] November 12, 1935, MRN [id number]  PCP:  Dettinger, Fransisca Kaufmann, MD  Cardiologist:  Rozann Lesches, MD Electrophysiologist:  None   Chief Complaint  Patient presents with   Cardiac follow-up    History of Present Illness: Jenna [name] is an 85 y.o. female last seen in June.  She is here for a routine visit.  Reports only a rare sense of palpitations in the last 6 months.  No prolonged events.  She reports compliance with her cardiac regimen which is noted below.  She has not had any spontaneous bleeding problems on Xarelto.  I reviewed her lab work from October as noted below.  She continues to follow with Dr. Warrick Parisian.  Past Medical History:  Diagnosis Date   Cataract    DDD (degenerative disc disease)    Fibromyalgia    Herpes zoster    History of skin cancer    Hyperlipidemia    Hypertension    Hypothyroidism    Iron deficiency anemia due to chronic blood loss 04/06/2018   OA (osteoarthritis)    OSA on CPAP 01/16/2018   Mild OSA overall with an AHI of 5.8/h but during REM sleep moderate with an AHI of 28/h.  Oxygen saturations dropped to 76% during respiratory events.  Now on CPAP at 9 cm H2O    Osteoporosis    Palpitations    Pre-diabetes    PUD (peptic ulcer disease)    Skin cancer of nose    [name]    Past Surgical History:  Procedure Laterality Date   ABDOMINAL HYSTERECTOMY     partial   BIOPSY  04/24/2018   Procedure: BIOPSY;  Surgeon: Rogene Houston, MD;  Location: [facility];  Service: Endoscopy;;  gastric body ulcerated surface   BREAST BIOPSY     Right   CATARACT EXTRACTION W/PHACO  10/28/2011   Procedure: CATARACT EXTRACTION PHACO AND INTRAOCULAR LENS PLACEMENT ([place]);  Surgeon: Tonny Branch, MD;  Location: [facility];  Service: Ophthalmology;  Laterality: Right;  CDE 18.38   CATARACT EXTRACTION W/PHACO  02/14/2012   Procedure: CATARACT EXTRACTION PHACO AND INTRAOCULAR LENS  PLACEMENT ([facility]);  Surgeon: Tonny Branch, MD;  Location: [facility];  Service: Ophthalmology;  Laterality: Left;  CDE 17.20   CESAREAN SECTION     COLONOSCOPY N/A 05/31/2013   Procedure: COLONOSCOPY;  Surgeon: Rogene Houston, MD;  Location: [facility];  Service: Endoscopy;  Laterality: N/A;  240   COLONOSCOPY N/A 04/24/2018   Rehman: - The examined portion of the ileum was normal. diverticulosos in sigmoid, external hemorrhoids, anal papilla hypertrophied, no specimens   ESOPHAGOGASTRODUODENOSCOPY N/A 04/24/2018   rehman: normal esophagus, z line irregular 41 cm from incosors, single gastric polyp with ulcerated surface, clip placed (MR conditional), normal duodenal bulb second portion of duodenum   EYE SURGERY     skin removal    FRACTURE SURGERY  1952   fractured right arm   LEFT HEART CATH AND CORONARY ANGIOGRAPHY N/A 09/25/2018   Procedure: LEFT HEART CATH AND CORONARY ANGIOGRAPHY;  Surgeon: Jettie Booze, MD;  Location: [facility];  Service: Cardiovascular;  Laterality: N/A;    Current Outpatient Medications  Medication Sig Dispense Refill   acetaminophen (TYLENOL) 500 MG tablet Take 500 mg by mouth daily as needed for moderate pain or headache.      albuterol (VENTOLIN HFA) 108 (90 Base) MCG/ACT inhaler Inhale 2 puffs into the  lungs every 6 (six) hours as needed. 18 g 1   Cholecalciferol (VITAMIN D3) 2000 UNITS TABS Take 2,000 Units by mouth daily.     colchicine 0.6 MG tablet TAKE 1 TABLET DAILY AS NEEDED FOR GOUT FLARE UP 30 tablet 5   furosemide (LASIX) 40 MG tablet Take 40 mg by mouth as needed.      hydrocortisone (ANUSOL-HC) 2.5 % rectal cream APPLY RECTALLY DAILY AS NEEDED FOR HEMMORHOIDS OR ITCHING. 30 g 0   levothyroxine (SYNTHROID) 75 MCG tablet Take 1 tablet (75 mcg total) by mouth daily. 90 tablet 3   metoprolol succinate (TOPROL-XL) 50 MG 24 hr tablet TAKE 1 TABLET ONCE A DAY WITH OR IMMEDIATELY FOLLOWING A MEAL. 30 tablet 5   nitroGLYCERIN (NITROSTAT) 0.4 MG  SL tablet PLACE 1 TAB UNDER TONGUE EVERY 3 MINUTES AS DIRECTED UP TO 3 TIMES AS NEEDED FOR CHEST PAIN. 25 tablet 2   pantoprazole (PROTONIX) 40 MG tablet TAKE 1 TABLET BY MOUTH DAILY BEFORE BREAKFAST. 90 tablet 3   pravastatin (PRAVACHOL) 20 MG tablet Take 1 tablet (20 mg total) by mouth daily. 90 tablet 1   rivaroxaban (XARELTO) 20 MG TABS tablet Take 1 tablet (20 mg total) by mouth daily with supper. 90 tablet 3   Current Facility-Administered Medications  Medication Dose Route Frequency Provider Last Rate Last Admin   cyanocobalamin ((VITAMIN B-12)) injection 1,000 mcg  1,000 mcg Intramuscular Q30 days Dettinger, Fransisca Kaufmann, MD   1,000 mcg at 04/20/21 1539   Allergies:  Daypro [oxaprozin], Diclofenac sodium, Effexor [venlafaxine hydrochloride], Lidocaine-prilocaine, Prozac [fluoxetine hcl], Amitriptyline, Cymbalta [duloxetine hcl], Duloxetine, Lipitor [atorvastatin calcium], Penicillins, Savella [milnacipran hcl], Sulfa antibiotics, and Zocor [simvastatin]   ROS:  No syncope.  Physical Exam: VS:  BP 138/62   Pulse 69   Ht 5\' 6"  (1.676 m)   Wt 206 lb 9.6 oz (93.7 kg)   SpO2 96%   BMI 33.35 kg/m , BMI Body mass index is 33.35 kg/m.  Wt Readings from Last 3 Encounters:  04/30/21 206 lb 9.6 oz (93.7 kg)  02/25/21 206 lb (93.4 kg)  01/22/21 205 lb (93 kg)    General: Patient appears comfortable at rest. HEENT: Conjunctiva and lids normal, wearing a mask. Neck: Supple, no elevated JVP or carotid bruits, no thyromegaly. Lungs: Clear to auscultation, nonlabored breathing at rest. Cardiac: Regular rate and rhythm, no S3 or significant systolic murmur, no pericardial rub. Extremities: No pitting edema.  ECG:  An ECG dated 09/04/2020 was personally reviewed today and demonstrated:  Sinus rhythm.  Recent Labwork: 02/25/2021: ALT 8; AST 14; BUN 10; Creatinine, Ser 0.66; Hemoglobin 13.5; Platelets 174; Potassium 4.7; Sodium 139; TSH 3.390     Component Value Date/Time   CHOL 206 (H)  02/25/2021 0905   TRIG 165 (H) 02/25/2021 0905   TRIG 161 (H) 10/14/2016 1010   HDL 56 02/25/2021 0905   HDL 54 10/14/2016 1010   CHOLHDL 3.7 02/25/2021 0905   CHOLHDL 3.6 10/16/2008 0540   VLDL 23 10/16/2008 0540   LDLCALC 121 (H) 02/25/2021 0905   LDLCALC 138 (H) 02/26/2014 1001    Other Studies Reviewed Today:  Echocardiogram 03/29/2018: Study Conclusions   - Left ventricle: The cavity size was normal. Wall thickness was   increased in a pattern of mild LVH. Systolic function was   vigorous. The estimated ejection fraction was in the range of 65%   to 70%. Wall motion was normal; there were no regional wall   motion abnormalities. Left ventricular diastolic  function   parameters were normal. - Aortic valve: There was mild regurgitation. Valve area (VTI):   2.92 cm^2. Valve area (Vmax): 2.78 cm^2. Valve area (Vmean): 2.95   cm^2.   Cardiac catheterization 09/25/2018: Prox LAD lesion is 20% stenosed. Mid RCA lesion is 10% stenosed. The left ventricular systolic function is normal. LV end diastolic pressure is normal. The left ventricular ejection fraction is 55-65% by visual estimate. There is no aortic valve stenosis.   Nonobstructive CAD.    Assessment and Plan:  1.  Paroxysmal atrial fibrillation/flutter with CHA2DS2-VASc score of 3.  She remains on Xarelto for stroke prophylaxis and also Toprol-XL.  I reviewed her recent lab work as noted above.  Continue with current medications and follow-up.  2.  Lymphedema, controlled with use of compression stockings and as needed Lasix.  Medication Adjustments/Labs and Tests Ordered: Current medicines are reviewed at length with the patient today.  Concerns regarding medicines are outlined above.   Tests Ordered: No orders of the defined types were placed in this encounter.   Medication Changes: No orders of the defined types were placed in this encounter.   Disposition:  Follow up  6 months.  Signed, Satira Sark, MD, [facility] 04/30/2021 4:27 PM    [facility] [place], [place], [place] [address] Phone: [phone]; Fax: [phone]

## 2021-04-30 NOTE — Patient Instructions (Addendum)

## 2021-05-11 ENCOUNTER — Ambulatory Visit (INDEPENDENT_AMBULATORY_CARE_PROVIDER_SITE_OTHER): Payer: Medicare Other | Admitting: Gastroenterology

## 2021-05-20 ENCOUNTER — Ambulatory Visit (INDEPENDENT_AMBULATORY_CARE_PROVIDER_SITE_OTHER): Payer: Medicare Other

## 2021-05-20 ENCOUNTER — Other Ambulatory Visit: Payer: Self-pay

## 2021-05-20 DIAGNOSIS — E538 Deficiency of other specified B group vitamins: Secondary | ICD-10-CM

## 2021-05-20 NOTE — Progress Notes (Signed)
Cyanocobalamin injection given to right deltoid.  Patient tolerated well. 

## 2021-05-29 DIAGNOSIS — H524 Presbyopia: Secondary | ICD-10-CM | POA: Diagnosis not present

## 2021-05-29 DIAGNOSIS — H35361 Drusen (degenerative) of macula, right eye: Secondary | ICD-10-CM | POA: Diagnosis not present

## 2021-05-29 DIAGNOSIS — Z0100 Encounter for examination of eyes and vision without abnormal findings: Secondary | ICD-10-CM | POA: Diagnosis not present

## 2021-06-06 ENCOUNTER — Other Ambulatory Visit: Payer: Self-pay | Admitting: Family Medicine

## 2021-06-15 ENCOUNTER — Encounter (HOSPITAL_COMMUNITY): Payer: Self-pay | Admitting: Internal Medicine

## 2021-06-17 ENCOUNTER — Encounter (HOSPITAL_COMMUNITY): Payer: Self-pay | Admitting: Internal Medicine

## 2021-06-22 ENCOUNTER — Ambulatory Visit (INDEPENDENT_AMBULATORY_CARE_PROVIDER_SITE_OTHER): Payer: Medicare HMO | Admitting: *Deleted

## 2021-06-22 DIAGNOSIS — E538 Deficiency of other specified B group vitamins: Secondary | ICD-10-CM

## 2021-06-22 NOTE — Progress Notes (Signed)
Pt given B12 injection IM left deltoid and tolerated well. °

## 2021-06-24 ENCOUNTER — Inpatient Hospital Stay (HOSPITAL_COMMUNITY): Payer: Medicare HMO | Attending: Hematology

## 2021-06-24 ENCOUNTER — Other Ambulatory Visit: Payer: Self-pay

## 2021-06-24 DIAGNOSIS — D500 Iron deficiency anemia secondary to blood loss (chronic): Secondary | ICD-10-CM

## 2021-06-24 DIAGNOSIS — Z79899 Other long term (current) drug therapy: Secondary | ICD-10-CM | POA: Diagnosis not present

## 2021-06-24 DIAGNOSIS — E538 Deficiency of other specified B group vitamins: Secondary | ICD-10-CM | POA: Insufficient documentation

## 2021-06-24 DIAGNOSIS — D509 Iron deficiency anemia, unspecified: Secondary | ICD-10-CM | POA: Diagnosis present

## 2021-06-24 LAB — CBC WITH DIFFERENTIAL/PLATELET
Abs Immature Granulocytes: 0.01 10*3/uL (ref 0.00–0.07)
Basophils Absolute: 0 10*3/uL (ref 0.0–0.1)
Basophils Relative: 1 %
Eosinophils Absolute: 0.1 10*3/uL (ref 0.0–0.5)
Eosinophils Relative: 2 %
HCT: 39.2 % (ref 36.0–46.0)
Hemoglobin: 13.3 g/dL (ref 12.0–15.0)
Immature Granulocytes: 0 %
Lymphocytes Relative: 20 %
Lymphs Abs: 1.2 10*3/uL (ref 0.7–4.0)
MCH: 32.4 pg (ref 26.0–34.0)
MCHC: 33.9 g/dL (ref 30.0–36.0)
MCV: 95.4 fL (ref 80.0–100.0)
Monocytes Absolute: 0.5 10*3/uL (ref 0.1–1.0)
Monocytes Relative: 8 %
Neutro Abs: 4.2 10*3/uL (ref 1.7–7.7)
Neutrophils Relative %: 69 %
Platelets: 179 10*3/uL (ref 150–400)
RBC: 4.11 MIL/uL (ref 3.87–5.11)
RDW: 12.6 % (ref 11.5–15.5)
WBC: 6 10*3/uL (ref 4.0–10.5)
nRBC: 0 % (ref 0.0–0.2)

## 2021-06-24 LAB — FERRITIN: Ferritin: 30 ng/mL (ref 11–307)

## 2021-06-24 LAB — IRON AND TIBC
Iron: 90 ug/dL (ref 28–170)
Saturation Ratios: 25 % (ref 10.4–31.8)
TIBC: 361 ug/dL (ref 250–450)
UIBC: 271 ug/dL

## 2021-06-24 LAB — VITAMIN B12: Vitamin B-12: 950 pg/mL — ABNORMAL HIGH (ref 180–914)

## 2021-06-28 LAB — METHYLMALONIC ACID, SERUM: Methylmalonic Acid, Quantitative: 131 nmol/L (ref 0–378)

## 2021-06-30 NOTE — Progress Notes (Signed)
Jenna [name], [place] [address]   CLINIC:  Medical Oncology/Hematology  PCP:  Dettinger, Fransisca Kaufmann, MD [place] [address] [phone]   REASON FOR VISIT:  Follow-up for iron deficiency anemia   PRIOR THERAPY: None   CURRENT THERAPY: Intermittent Feraheme (last on 03/07/2020)  INTERVAL HISTORY:  Jenna [name] 86 y.o. female returns for routine follow-up of her iron deficiency anemia.  She was last seen by Tarri Abernethy PA-C on 12/22/2020.  At today's visit, she reports feeling fair.  No recent hospitalizations, surgeries, or changes in baseline health status.  She reports occasional scant rectal bleeding from hemorrhoids, but no gross rectal hemorrhage, melena, epistaxis, or other sources of bleeding.  She is symptomatic with significant fatigue, reports that she feels "weak and tired" most of the time.  This has been improved with IV iron in the past.  She also reports restless legs at night as well as dyspnea on exertion.  She denies any pica, headaches, chest pain, lightheadedness, or syncope.  No B symptoms such as fever, chills, night sweats, or unintentional weight loss.  She has 50% energy and 80% appetite. She endorses that she is maintaining a stable weight.   REVIEW OF SYSTEMS:  Review of Systems  Constitutional:  Positive for fatigue. Negative for appetite change, chills, diaphoresis, fever and unexpected weight change.  HENT:   Positive for trouble swallowing. Negative for lump/mass and nosebleeds.   Eyes:  Negative for eye problems.  Respiratory:  Positive for shortness of breath (with exertion). Negative for cough and hemoptysis.   Cardiovascular:  Negative for chest pain, leg swelling and palpitations.  Gastrointestinal:  Positive for constipation. Negative for abdominal pain, blood in stool, diarrhea, nausea and vomiting.  Genitourinary:  Negative for hematuria.   Skin: Negative.   Neurological:  Positive for  numbness. Negative for dizziness, headaches and light-headedness.  Hematological:  Does not bruise/bleed easily.  Psychiatric/Behavioral:  Positive for sleep disturbance.      PAST MEDICAL/SURGICAL HISTORY:  Past Medical History:  Diagnosis Date   Cataract    DDD (degenerative disc disease)    Fibromyalgia    Herpes zoster    History of skin cancer    Hyperlipidemia    Hypertension    Hypothyroidism    Iron deficiency anemia due to chronic blood loss 04/06/2018   OA (osteoarthritis)    OSA on CPAP 01/16/2018   Mild OSA overall with an AHI of 5.8/h but during REM sleep moderate with an AHI of 28/h.  Oxygen saturations dropped to 76% during respiratory events.  Now on CPAP at 9 cm H2O    Osteoporosis    Palpitations    Pre-diabetes    PUD (peptic ulcer disease)    Skin cancer of nose    [name]   Past Surgical History:  Procedure Laterality Date   ABDOMINAL HYSTERECTOMY     partial   BIOPSY  04/24/2018   Procedure: BIOPSY;  Surgeon: Rogene Houston, MD;  [facility];  Service: Endoscopy;;  gastric body ulcerated surface   BREAST BIOPSY     Right   CATARACT EXTRACTION W/PHACO  10/28/2011   Procedure: CATARACT EXTRACTION PHACO AND INTRAOCULAR LENS PLACEMENT ([place]);  Surgeon: Tonny Branch, MD;  Location: [facility];  Service: Ophthalmology;  Laterality: Right;  CDE 18.38   CATARACT EXTRACTION W/PHACO  02/14/2012   Procedure: CATARACT EXTRACTION PHACO AND INTRAOCULAR LENS PLACEMENT ([facility]);  Surgeon: Tonny Branch, MD;  Location: [facility];  Service: Ophthalmology;  Laterality: Left;  CDE 17.20   CESAREAN SECTION     COLONOSCOPY N/A 05/31/2013   Procedure: COLONOSCOPY;  Surgeon: Rogene Houston, MD;  Location: [facility];  Service: Endoscopy;  Laterality: N/A;  240   COLONOSCOPY N/A 04/24/2018   Rehman: - The examined portion of the ileum was normal. diverticulosos in sigmoid, external hemorrhoids, anal papilla hypertrophied, no specimens   ESOPHAGOGASTRODUODENOSCOPY N/A  04/24/2018   rehman: normal esophagus, z line irregular 41 cm from incosors, single gastric polyp with ulcerated surface, clip placed (MR conditional), normal duodenal bulb second portion of duodenum   EYE SURGERY     skin removal    FRACTURE SURGERY  1952   fractured right arm   LEFT HEART CATH AND CORONARY ANGIOGRAPHY N/A 09/25/2018   Procedure: LEFT HEART CATH AND CORONARY ANGIOGRAPHY;  Surgeon: Jettie Booze, MD;  Location: [facility];  Service: Cardiovascular;  Laterality: N/A;     SOCIAL HISTORY:  Social History   Socioeconomic History   Marital status: Married    Spouse name: [name]   Number of children: 1   Years of education: 9   Highest education level: 9th grade  Occupational History   Occupation: Retired    Comment: Therapist, art  Tobacco Use   Smoking status: Never   Smokeless tobacco: Never  Scientific laboratory technician Use: Never used  Substance and Sexual Activity   Alcohol use: No   Drug use: No   Sexual activity: Not Currently    Birth control/[name]: Surgical  Other Topics Concern   Not on file  Social History Narrative   Married   Lives in a one story home    Social Determinants of Health   Financial Resource Strain: Not on file  Food Insecurity: Not on file  Transportation Needs: Not on file  Physical Activity: Not on file  Stress: Not on file  Social Connections: Not on file  Intimate Partner Violence: Not on file    FAMILY HISTORY:  Family History  Problem Relation Age of Onset   CVA Mother    Stroke Mother    Osteoporosis Mother    Cancer Father        prostate   Bronchitis Sister    COPD Sister    Early death Brother        died in MVA   Carpal tunnel syndrome Sister    Cancer Brother        throat   Cancer Brother        lung   Cancer Brother        throat   Stroke Brother    Heart attack Brother    Gout Brother    Heart Problems Brother        stents   CAD Brother    Hypertension Brother     Gout Brother    Arthritis Brother    Diabetes Son    Coronary artery disease Neg Hx        No premature   Anesthesia problems Neg Hx    Hypotension Neg Hx    Malignant hyperthermia Neg Hx    Pseudochol deficiency Neg Hx     CURRENT MEDICATIONS:  Outpatient Encounter Medications as of 07/01/2021  Medication Sig   acetaminophen (TYLENOL) 500 MG tablet Take 500 mg by mouth daily as needed for moderate pain or headache.    albuterol (VENTOLIN HFA) 108 (90 Base)  MCG/ACT inhaler Inhale 2 puffs into the lungs every 6 (six) hours as needed.   Cholecalciferol (VITAMIN D3) 2000 UNITS TABS Take 2,000 Units by mouth daily.   colchicine 0.6 MG tablet TAKE 1 TABLET DAILY AS NEEDED FOR GOUT FLARE UP   furosemide (LASIX) 40 MG tablet Take 40 mg by mouth as needed.    hydrocortisone (ANUSOL-HC) 2.5 % rectal cream APPLY RECTALLY DAILY AS NEEDED FOR HEMMORHOIDS OR ITCHING.   levothyroxine (SYNTHROID) 75 MCG tablet Take 1 tablet (75 mcg total) by mouth daily.   metoprolol succinate (TOPROL-XL) 50 MG 24 hr tablet TAKE 1 TABLET ONCE A DAY WITH OR IMMEDIATELY FOLLOWING A MEAL.   nitroGLYCERIN (NITROSTAT) 0.4 MG SL tablet PLACE 1 TAB UNDER TONGUE EVERY 3 MINUTES AS DIRECTED UP TO 3 TIMES AS NEEDED FOR CHEST PAIN.   pantoprazole (PROTONIX) 40 MG tablet TAKE 1 TABLET BY MOUTH DAILY BEFORE BREAKFAST.   pravastatin (PRAVACHOL) 20 MG tablet Take 1 tablet (20 mg total) by mouth daily.   rivaroxaban (XARELTO) 20 MG TABS tablet Take 1 tablet (20 mg total) by mouth daily with supper.   Facility-Administered Encounter Medications as of 07/01/2021  Medication   cyanocobalamin ((VITAMIN B-12)) injection 1,000 mcg    ALLERGIES:  Allergies  Allergen Reactions   Daypro [Oxaprozin]     Unknown reaction   Diclofenac Sodium     Unknown reaction   Effexor [Venlafaxine Hydrochloride]     Unknown reaction   Lidocaine-Prilocaine    Prozac [Fluoxetine Hcl]     Unknown reaction   Amitriptyline Rash   Cymbalta  [Duloxetine Hcl] Nausea Only   Duloxetine Nausea Only   Lipitor [Atorvastatin Calcium] Other (See Comments)    Leg aches   Penicillins Rash    Has patient had a PCN reaction causing immediate rash, facial/tongue/throat swelling, SOB or lightheadedness with hypotension: Yes Has patient had a PCN reaction causing severe rash involving mucus membranes or skin necrosis: No Has patient had a PCN reaction that required hospitalization: No Has patient had a PCN reaction occurring within the last 10 years: No If all of the above answers are "NO", then may proceed with Cephalosporin use.    Savella [Milnacipran Hcl] Rash   Sulfa Antibiotics Rash   Zocor [Simvastatin] Other (See Comments)    Hip pain     PHYSICAL EXAM:  ECOG PERFORMANCE STATUS: 1 - Symptomatic but completely ambulatory  There were no vitals filed for this visit. There were no vitals filed for this visit. Physical Exam Constitutional:      Appearance: Normal appearance. She is obese.  HENT:     Head: Normocephalic and atraumatic.     Mouth/Throat:     Mouth: Mucous membranes are moist.  Eyes:     Extraocular Movements: Extraocular movements intact.     Pupils: Pupils are equal, round, and reactive to light.  Cardiovascular:     Rate and Rhythm: Normal rate and regular rhythm.     Pulses: Normal pulses.     Heart sounds: Normal heart sounds.  Pulmonary:     Effort: Pulmonary effort is normal.     Breath sounds: Normal breath sounds.  Abdominal:     General: Bowel sounds are normal.     Palpations: Abdomen is soft.     Tenderness: There is no abdominal tenderness.  Musculoskeletal:        General: No swelling.     Right lower leg: Edema (trace ankle edema) present.     Left lower leg:  Edema (trace ankle edema) present.  Lymphadenopathy:     Cervical: No cervical adenopathy.  Skin:    General: Skin is warm and dry.  Neurological:     General: No focal deficit present.     Mental Status: She is alert and  oriented to person, place, and time.  Psychiatric:        Mood and Affect: Mood normal.        Behavior: Behavior normal.     LABORATORY DATA:  I have reviewed the labs as listed.  CBC    Component Value Date/Time   WBC 6.0 06/24/2021 1347   RBC 4.11 06/24/2021 1347   HGB 13.3 06/24/2021 1347   HGB 13.5 02/25/2021 0905   HCT 39.2 06/24/2021 1347   HCT 39.1 02/25/2021 0905   PLT 179 06/24/2021 1347   PLT 174 02/25/2021 0905   MCV 95.4 06/24/2021 1347   MCV 93 02/25/2021 0905   MCH 32.4 06/24/2021 1347   MCHC 33.9 06/24/2021 1347   RDW 12.6 06/24/2021 1347   RDW 12.1 02/25/2021 0905   LYMPHSABS 1.2 06/24/2021 1347   LYMPHSABS 0.9 02/25/2021 0905   MONOABS 0.5 06/24/2021 1347   EOSABS 0.1 06/24/2021 1347   EOSABS 0.1 02/25/2021 0905   BASOSABS 0.0 06/24/2021 1347   BASOSABS 0.0 02/25/2021 0905   CMP Latest Ref Rng & Units 02/25/2021 09/04/2020 06/16/2020  Glucose 70 - 99 mg/dL 121(H) 145(H) 156(H)  BUN 8 - 27 mg/dL 10 14 12   Creatinine 0.57 - 1.00 mg/dL 0.66 0.73 0.68  Sodium 134 - 144 mmol/L 139 138 133(L)  Potassium 3.5 - 5.2 mmol/L 4.7 4.7 4.1  Chloride 96 - 106 mmol/L 99 100 97(L)  CO2 20 - 29 mmol/L 26 29 27   Calcium 8.7 - 10.3 mg/dL 9.2 9.1 8.8(L)  Total Protein 6.0 - 8.5 g/dL 6.1 6.5 6.3(L)  Total Bilirubin 0.0 - 1.2 mg/dL 0.6 0.8 0.8  Alkaline Phos 44 - 121 IU/L 94 90 85  AST 0 - 40 IU/L 14 18 43(H)  ALT 0 - 32 IU/L 8 15 35    DIAGNOSTIC IMAGING:  I have independently reviewed the relevant imaging and discussed with the patient.  ASSESSMENT & PLAN: 1.  Iron deficiency anemia - Initial evaluation on 03/15/2018 showed Hgb 8.9, platelets 267, ferritin 12 - EGD and colonoscopy on 04/24/2018 showed single gastric polyp with ulcerated surface, external hemorrhoids and diverticulosis - Last Feraheme on 02/29/2020 and 03/07/2020 - She was unable to tolerate oral iron due to stomach pain and constipation - Patient continues to have fatigue and low energy.   - She  denies any bright red blood per rectum or melena.   - Most recent labs (06/24/2021): Hgb 13.3, ferritin 30, iron saturation 25% - PLAN: Recommend IV Venofer 300 mg x 3 due to symptomatic iron deficiency  - Repeat labs and RTC in 6 months.   2.  B12 deficiency - Receives monthly B12 injections from her PCP (Dr. Warrick Parisian) - Most recent labs (06/24/2021): Normal methylmalonic acid, B12 950 - PLAN: Continue monthly B12 injections.   PLAN SUMMARY & DISPOSITION: Venofer 300 mg x 3 Labs in 6 months RTC after labs  All questions were answered. The patient knows to call the clinic with any problems, questions or concerns.  Medical decision making: Low  Time spent on visit: I spent 15 minutes counseling the patient face to face. The total time spent in the appointment was 22 minutes and more than 50% was on counseling.  Harriett Rush, PA-C  07/01/2021 11:57 AM

## 2021-07-01 ENCOUNTER — Inpatient Hospital Stay (HOSPITAL_BASED_OUTPATIENT_CLINIC_OR_DEPARTMENT_OTHER): Payer: Medicare HMO | Admitting: Physician Assistant

## 2021-07-01 ENCOUNTER — Inpatient Hospital Stay (HOSPITAL_COMMUNITY): Payer: Medicare Other | Admitting: Physician Assistant

## 2021-07-01 ENCOUNTER — Other Ambulatory Visit: Payer: Self-pay

## 2021-07-01 VITALS — BP 168/61 | HR 72 | Temp 98.8°F | Ht 66.0 in | Wt 207.4 lb

## 2021-07-01 DIAGNOSIS — D509 Iron deficiency anemia, unspecified: Secondary | ICD-10-CM | POA: Diagnosis not present

## 2021-07-01 DIAGNOSIS — E538 Deficiency of other specified B group vitamins: Secondary | ICD-10-CM | POA: Diagnosis not present

## 2021-07-01 DIAGNOSIS — D500 Iron deficiency anemia secondary to blood loss (chronic): Secondary | ICD-10-CM

## 2021-07-01 DIAGNOSIS — Z79899 Other long term (current) drug therapy: Secondary | ICD-10-CM | POA: Diagnosis not present

## 2021-07-01 NOTE — Patient Instructions (Signed)
[place] at [facility] Discharge Instructions  You were seen today by Tarri Abernethy PA-C for your iron deficiency anemia.  Your blood levels looked great, but your iron levels are a bit low.  We will schedule you for IV iron x 2 doses.    LABS: Return in 6 months for repeat labs   MEDICATIONS: IV iron x 2  FOLLOW-UP APPOINTMENT: Office visit in 6 months, after labs   Thank you for choosing [place] at [facility] to provide your oncology and hematology care.  To afford each patient quality time with our provider, please arrive at least 15 minutes before your scheduled appointment time.   If you have a lab appointment with the [facility] please come in thru the Main Entrance and check in at the main information desk.  You need to re-schedule your appointment should you arrive 10 or more minutes late.  We strive to give you quality time with our providers, and arriving late affects you and other patients whose appointments are after yours.  Also, if you no show three or more times for appointments you may be dismissed from the clinic at the providers discretion.     Again, thank you for choosing [facility].  Our hope is that these requests will decrease the amount of time that you wait before being seen by our physicians.       _____________________________________________________________  Should you have questions after your visit to [facility], please contact our office at [phone] and follow the prompts.  Our office hours are 8:00 a.m. and 4:30 p.m. Monday - Friday.  Please note that voicemails left after 4:00 p.m. may not be returned until the following business day.  We are closed weekends and major holidays.  You do have access to a nurse 24-7, just call the main number to the clinic [phone] and do not press any options, hold on the line and a nurse will answer the phone.    For prescription  refill requests, have your pharmacy contact our office and allow 72 hours.    Due to Covid, you will need to wear a mask upon entering the hospital. If you do not have a mask, a mask will be given to you at the Main Entrance upon arrival. For doctor visits, patients may have 1 support person age 72 or older with them. For treatment visits, patients can not have anyone with them due to social distancing guidelines and our immunocompromised population.

## 2021-07-02 ENCOUNTER — Inpatient Hospital Stay (HOSPITAL_COMMUNITY): Payer: Medicare HMO

## 2021-07-02 ENCOUNTER — Encounter (HOSPITAL_COMMUNITY): Payer: Self-pay

## 2021-07-02 VITALS — BP 158/71 | HR 66 | Temp 97.5°F | Resp 18

## 2021-07-02 DIAGNOSIS — Z79899 Other long term (current) drug therapy: Secondary | ICD-10-CM | POA: Diagnosis not present

## 2021-07-02 DIAGNOSIS — D500 Iron deficiency anemia secondary to blood loss (chronic): Secondary | ICD-10-CM

## 2021-07-02 DIAGNOSIS — D509 Iron deficiency anemia, unspecified: Secondary | ICD-10-CM | POA: Diagnosis not present

## 2021-07-02 DIAGNOSIS — E538 Deficiency of other specified B group vitamins: Secondary | ICD-10-CM | POA: Diagnosis not present

## 2021-07-02 MED ORDER — LORATADINE 10 MG PO TABS
10.0000 mg | ORAL_TABLET | Freq: Once | ORAL | Status: AC
  Administered 2021-07-02: 10 mg via ORAL
  Filled 2021-07-02: qty 1

## 2021-07-02 MED ORDER — ACETAMINOPHEN 325 MG PO TABS
650.0000 mg | ORAL_TABLET | Freq: Once | ORAL | Status: AC
  Administered 2021-07-02: 650 mg via ORAL
  Filled 2021-07-02: qty 2

## 2021-07-02 MED ORDER — SODIUM CHLORIDE 0.9 % IV SOLN: Freq: Once | INTRAVENOUS | Status: AC

## 2021-07-02 MED ORDER — SODIUM CHLORIDE 0.9 % IV SOLN
300.0000 mg | Freq: Once | INTRAVENOUS | Status: AC
  Administered 2021-07-02: 300 mg via INTRAVENOUS
  Filled 2021-07-02: qty 5

## 2021-07-02 NOTE — Patient Instructions (Signed)
[facility]  Discharge Instructions: °Thank you for choosing [facility] to provide your oncology and hematology care.  °If you have a lab appointment with the Cancer Center, please come in thru the Main Entrance and check in at the main information desk. ° °Wear comfortable clothing and clothing appropriate for easy access to any Portacath or PICC line.  ° °We strive to give you quality time with your provider. You may need to reschedule your appointment if you arrive late (15 or more minutes).  Arriving late affects you and other patients whose appointments are after yours.  Also, if you miss three or more appointments without notifying the office, you may be dismissed from the clinic at the provider’s discretion.    °  °For prescription refill requests, have your pharmacy contact our office and allow 72 hours for refills to be completed.   ° °Today you received the following : Venofer 300 mg.     °  °To help prevent nausea and vomiting after your treatment, we encourage you to take your nausea medication as directed. ° °BELOW ARE SYMPTOMS THAT SHOULD BE REPORTED IMMEDIATELY: °*FEVER GREATER THAN 100.4 F (38 °C) OR HIGHER °*CHILLS OR SWEATING °*NAUSEA AND VOMITING THAT IS NOT CONTROLLED WITH YOUR NAUSEA MEDICATION °*UNUSUAL SHORTNESS OF BREATH °*UNUSUAL BRUISING OR BLEEDING °*URINARY PROBLEMS (pain or burning when urinating, or frequent urination) °*BOWEL PROBLEMS (unusual diarrhea, constipation, pain near the anus) °TENDERNESS IN MOUTH AND THROAT WITH OR WITHOUT PRESENCE OF ULCERS (sore throat, sores in mouth, or a toothache) °UNUSUAL RASH, SWELLING OR PAIN  °UNUSUAL VAGINAL DISCHARGE OR ITCHING  ° °Items with * indicate a potential emergency and should be followed up as soon as possible or go to the Emergency Department if any problems should occur. ° °Please show the CHEMOTHERAPY ALERT CARD or IMMUNOTHERAPY ALERT CARD at check-in to the Emergency Department and triage nurse. ° °Should  you have questions after your visit or need to cancel or reschedule your appointment, please contact [facility] [phone]  and follow the prompts.  Office hours are 8:00 a.m. to 4:30 p.m. Monday - Friday. Please note that voicemails left after 4:00 p.m. may not be returned until the following business day.  We are closed weekends and major holidays. You have access to a nurse at all times for urgent questions. Please call the main number to the clinic [phone] and follow the prompts. ° °For any non-urgent questions, you may also contact your provider using MyChart. We now offer e-Visits for anyone 18 and older to request care online for non-urgent symptoms. For details visit [email or web address]. °  °Also download the MyChart app! Go to the app store, search "MyChart", open the app, select [facility], and log in with your MyChart username and password. ° °Due to Covid, a mask is required upon entering the hospital/clinic. If you do not have a mask, one will be given to you upon arrival. For doctor visits, patients may have 1 support person aged 18 or older with them. For treatment visits, patients cannot have anyone with them due to current Covid guidelines and our immunocompromised population.  °

## 2021-07-02 NOTE — Progress Notes (Signed)
Patient presents today for IV Venofer 300 mg infusion. Vital signs are stable. Patient denies any changes since the last iron infusion. Patient denies any complaints today. MAR reviewed and updated.    Venofer 300 mg given today per MD orders. Tolerated infusion without adverse affects. Vital signs stable. No complaints at this time. Discharged from clinic ambulatory in stable condition. Alert and oriented x 3. F/U with [facility] as scheduled.

## 2021-07-10 ENCOUNTER — Inpatient Hospital Stay (HOSPITAL_COMMUNITY): Payer: Medicare HMO

## 2021-07-17 ENCOUNTER — Inpatient Hospital Stay (HOSPITAL_COMMUNITY): Payer: Medicare HMO

## 2021-07-17 ENCOUNTER — Other Ambulatory Visit: Payer: Self-pay

## 2021-07-17 VITALS — BP 144/50 | HR 58 | Temp 97.9°F | Resp 18

## 2021-07-17 DIAGNOSIS — D509 Iron deficiency anemia, unspecified: Secondary | ICD-10-CM | POA: Diagnosis not present

## 2021-07-17 DIAGNOSIS — D500 Iron deficiency anemia secondary to blood loss (chronic): Secondary | ICD-10-CM

## 2021-07-17 DIAGNOSIS — Z79899 Other long term (current) drug therapy: Secondary | ICD-10-CM | POA: Diagnosis not present

## 2021-07-17 DIAGNOSIS — E538 Deficiency of other specified B group vitamins: Secondary | ICD-10-CM | POA: Diagnosis not present

## 2021-07-17 MED ORDER — LORATADINE 10 MG PO TABS
10.0000 mg | ORAL_TABLET | Freq: Once | ORAL | Status: AC
  Administered 2021-07-17: 10 mg via ORAL
  Filled 2021-07-17: qty 1

## 2021-07-17 MED ORDER — SODIUM CHLORIDE 0.9 % IV SOLN: Freq: Once | INTRAVENOUS | Status: AC

## 2021-07-17 MED ORDER — ACETAMINOPHEN 325 MG PO TABS
650.0000 mg | ORAL_TABLET | Freq: Once | ORAL | Status: AC
  Administered 2021-07-17: 650 mg via ORAL
  Filled 2021-07-17: qty 2

## 2021-07-17 MED ORDER — SODIUM CHLORIDE 0.9 % IV SOLN
300.0000 mg | Freq: Once | INTRAVENOUS | Status: AC
  Administered 2021-07-17: 300 mg via INTRAVENOUS
  Filled 2021-07-17: qty 300

## 2021-07-17 NOTE — Patient Instructions (Signed)
[facility]  Discharge Instructions: Thank you for choosing [facility] to provide your oncology and hematology care.  If you have a lab appointment with the [facility], please come in thru the Main Entrance and check in at the main information desk.  Wear comfortable clothing and clothing appropriate for easy access to any Portacath or PICC line.   We strive to give you quality time with your provider. You may need to reschedule your appointment if you arrive late (15 or more minutes).  Arriving late affects you and other patients whose appointments are after yours.  Also, if you miss three or more appointments without notifying the office, you may be dismissed from the clinic at the providers discretion.      For prescription refill requests, have your pharmacy contact our office and allow 72 hours for refills to be completed.    Today you received the Venofer 300 mg   BELOW ARE SYMPTOMS THAT SHOULD BE REPORTED IMMEDIATELY: *FEVER GREATER THAN 100.4 F (38 C) OR HIGHER *CHILLS OR SWEATING *NAUSEA AND VOMITING THAT IS NOT CONTROLLED WITH YOUR NAUSEA MEDICATION *UNUSUAL SHORTNESS OF BREATH *UNUSUAL BRUISING OR BLEEDING *URINARY PROBLEMS (pain or burning when urinating, or frequent urination) *BOWEL PROBLEMS (unusual diarrhea, constipation, pain near the anus) TENDERNESS IN MOUTH AND THROAT WITH OR WITHOUT PRESENCE OF ULCERS (sore throat, sores in mouth, or a toothache) UNUSUAL RASH, SWELLING OR PAIN  UNUSUAL VAGINAL DISCHARGE OR ITCHING   Items with * indicate a potential emergency and should be followed up as soon as possible or go to the Emergency Department if any problems should occur.  Please show the CHEMOTHERAPY ALERT CARD or IMMUNOTHERAPY ALERT CARD at check-in to the Emergency Department and triage nurse.  Should you have questions after your visit or need to cancel or reschedule your appointment, please contact [facility]  [phone]  and follow the prompts.  Office hours are 8:00 a.m. to 4:30 p.m. Monday - Friday. Please note that voicemails left after 4:00 p.m. may not be returned until the following business day.  We are closed weekends and major holidays. You have access to a nurse at all times for urgent questions. Please call the main number to the clinic [phone] and follow the prompts.  For any non-urgent questions, you may also contact your provider using MyChart. We now offer e-Visits for anyone 64 and older to request care online for non-urgent symptoms. For details visit [email or web address].   Also download the MyChart app! Go to the app store, search "MyChart", open the app, select [facility], and log in with your MyChart username and password.  Due to Covid, a mask is required upon entering the hospital/clinic. If you do not have a mask, one will be given to you upon arrival. For doctor visits, patients may have 1 support person aged 53 or older with them. For treatment visits, patients cannot have anyone with them due to current Covid guidelines and our immunocompromised population.

## 2021-07-17 NOTE — Progress Notes (Signed)
Pt presents today for Venofer IV iron infusion per provider's order. Vital signs stable and pt voiced no new complaints at this time.  Peripheral IV started with good blood return pre and post infusion.  Venofer 300 mg  given today per MD orders. Tolerated infusion without adverse affects. Vital signs stable. No complaints at this time. Discharged from clinic ambulatory in stable condition. Alert and oriented x 3. F/U with [facility] as scheduled.   

## 2021-07-24 ENCOUNTER — Ambulatory Visit (INDEPENDENT_AMBULATORY_CARE_PROVIDER_SITE_OTHER): Payer: Medicare HMO

## 2021-07-24 ENCOUNTER — Inpatient Hospital Stay (HOSPITAL_COMMUNITY): Payer: Medicare HMO | Attending: Hematology

## 2021-07-24 ENCOUNTER — Other Ambulatory Visit: Payer: Self-pay

## 2021-07-24 VITALS — BP 161/78 | HR 64 | Temp 96.7°F | Resp 18

## 2021-07-24 DIAGNOSIS — D509 Iron deficiency anemia, unspecified: Secondary | ICD-10-CM | POA: Diagnosis not present

## 2021-07-24 DIAGNOSIS — E538 Deficiency of other specified B group vitamins: Secondary | ICD-10-CM

## 2021-07-24 DIAGNOSIS — D500 Iron deficiency anemia secondary to blood loss (chronic): Secondary | ICD-10-CM

## 2021-07-24 MED ORDER — SODIUM CHLORIDE 0.9 % IV SOLN: Freq: Once | INTRAVENOUS | Status: AC

## 2021-07-24 MED ORDER — SODIUM CHLORIDE 0.9 % IV SOLN
300.0000 mg | Freq: Once | INTRAVENOUS | Status: AC
  Administered 2021-07-24: 300 mg via INTRAVENOUS
  Filled 2021-07-24: qty 300

## 2021-07-24 MED ORDER — LORATADINE 10 MG PO TABS
10.0000 mg | ORAL_TABLET | Freq: Once | ORAL | Status: AC
  Administered 2021-07-24: 10 mg via ORAL
  Filled 2021-07-24: qty 1

## 2021-07-24 MED ORDER — ACETAMINOPHEN 325 MG PO TABS
650.0000 mg | ORAL_TABLET | Freq: Once | ORAL | Status: AC
  Administered 2021-07-24: 650 mg via ORAL
  Filled 2021-07-24: qty 2

## 2021-07-24 NOTE — Progress Notes (Unsigned)
Patient presents today for Venofer infusion per providers order.  Vital signs WNL.  Patient has no new complaints at this time.  Peripheral IV started and blood return noted pre and post infusion.  Venofer infusion given today per MD orders.  Stable during infusion without adverse affects.  Vital signs stable.  No complaints at this time.  Discharge from clinic ambulatory in stable condition.  Alert and oriented X 3.  Follow up with [facility] as scheduled.  

## 2021-07-24 NOTE — Patient Instructions (Signed)
[facility]  Discharge Instructions: Thank you for choosing [facility] to provide your oncology and hematology care.  If you have a lab appointment with the Cancer Center, please come in thru the Main Entrance and check in at the main information desk.  Wear comfortable clothing and clothing appropriate for easy access to any Portacath or PICC line.   We strive to give you quality time with your provider. You may need to reschedule your appointment if you arrive late (15 or more minutes).  Arriving late affects you and other patients whose appointments are after yours.  Also, if you miss three or more appointments without notifying the office, you may be dismissed from the clinic at the provider's discretion.      For prescription refill requests, have your pharmacy contact our office and allow 72 hours for refills to be completed.    Today you received the following chemotherapy and/or immunotherapy agents Venofer      To help prevent nausea and vomiting after your treatment, we encourage you to take your nausea medication as directed.  BELOW ARE SYMPTOMS THAT SHOULD BE REPORTED IMMEDIATELY: *FEVER GREATER THAN 100.4 F (38 C) OR HIGHER *CHILLS OR SWEATING *NAUSEA AND VOMITING THAT IS NOT CONTROLLED WITH YOUR NAUSEA MEDICATION *UNUSUAL SHORTNESS OF BREATH *UNUSUAL BRUISING OR BLEEDING *URINARY PROBLEMS (pain or burning when urinating, or frequent urination) *BOWEL PROBLEMS (unusual diarrhea, constipation, pain near the anus) TENDERNESS IN MOUTH AND THROAT WITH OR WITHOUT PRESENCE OF ULCERS (sore throat, sores in mouth, or a toothache) UNUSUAL RASH, SWELLING OR PAIN  UNUSUAL VAGINAL DISCHARGE OR ITCHING   Items with * indicate a potential emergency and should be followed up as soon as possible or go to the Emergency Department if any problems should occur.  Please show the CHEMOTHERAPY ALERT CARD or IMMUNOTHERAPY ALERT CARD at check-in to the Emergency  Department and triage nurse.  Should you have questions after your visit or need to cancel or reschedule your appointment, please contact [facility] [phone]  and follow the prompts.  Office hours are 8:00 a.m. to 4:30 p.m. Monday - Friday. Please note that voicemails left after 4:00 p.m. may not be returned until the following business day.  We are closed weekends and major holidays. You have access to a nurse at all times for urgent questions. Please call the main number to the clinic [phone] and follow the prompts.  For any non-urgent questions, you may also contact your provider using MyChart. We now offer e-Visits for anyone 18 and older to request care online for non-urgent symptoms. For details visit [email or web address].   Also download the MyChart app! Go to the app store, search "MyChart", open the app, select , and log in with your MyChart username and password.  Due to Covid, a mask is required upon entering the hospital/clinic. If you do not have a mask, one will be given to you upon arrival. For doctor visits, patients may have 1 support person aged 18 or older with them. For treatment visits, patients cannot have anyone with them due to current Covid guidelines and our immunocompromised population.  

## 2021-07-24 NOTE — Progress Notes (Signed)
Pt given b12inj IM 101mcg in the R-deltoid. Pt tol tx well ?

## 2021-07-24 NOTE — Progress Notes (Signed)
Chaplain engaged in an initial visit with Jenna [name] and her husband, Jenna [name].  Chaplain spent time building community and providing space for them to share about themselves.  Jenna [name] and Jenna [name] have been married 5 years and met as teenagers on a church bus.  They have one son and one grandson.  They are of the [employer] tradition and reside in [place].  Jenna [name] and Jenna [name] are both active in their [employer].  They credit God and their faith for being able to be sustained in marriage and life this long.  They are surrounded by family and friends, and have a great support system.  ? ?Jenna [name] enjoys being there for others and being involved in her church community.  Jenna [name] values working around the home, singing with the praise team at church, and keeping busy.   ? ?Chaplain offered listening, presence and support during this time of building relationship.  ? ? ? 07/24/21 1100  ?Clinical Encounter Type  ?Visited With Patient and family together  ?Visit Type Initial;Spiritual support  ? ? ?

## 2021-08-06 ENCOUNTER — Other Ambulatory Visit: Payer: Self-pay | Admitting: Family Medicine

## 2021-08-18 ENCOUNTER — Other Ambulatory Visit: Payer: Self-pay | Admitting: Family Medicine

## 2021-08-18 DIAGNOSIS — K219 Gastro-esophageal reflux disease without esophagitis: Secondary | ICD-10-CM

## 2021-08-24 ENCOUNTER — Ambulatory Visit: Payer: Medicare HMO

## 2021-08-27 ENCOUNTER — Ambulatory Visit (INDEPENDENT_AMBULATORY_CARE_PROVIDER_SITE_OTHER): Payer: Medicare HMO | Admitting: *Deleted

## 2021-08-27 ENCOUNTER — Encounter (HOSPITAL_COMMUNITY): Payer: Self-pay | Admitting: Hematology

## 2021-08-27 DIAGNOSIS — J00 Acute nasopharyngitis [common cold]: Secondary | ICD-10-CM | POA: Diagnosis not present

## 2021-08-27 DIAGNOSIS — E538 Deficiency of other specified B group vitamins: Secondary | ICD-10-CM

## 2021-08-27 DIAGNOSIS — R070 Pain in throat: Secondary | ICD-10-CM | POA: Diagnosis not present

## 2021-09-02 DIAGNOSIS — H1033 Unspecified acute conjunctivitis, bilateral: Secondary | ICD-10-CM | POA: Diagnosis not present

## 2021-09-10 DIAGNOSIS — Z85828 Personal history of other malignant neoplasm of skin: Secondary | ICD-10-CM | POA: Diagnosis not present

## 2021-09-10 DIAGNOSIS — D6869 Other thrombophilia: Secondary | ICD-10-CM | POA: Diagnosis not present

## 2021-09-10 DIAGNOSIS — R030 Elevated blood-pressure reading, without diagnosis of hypertension: Secondary | ICD-10-CM | POA: Diagnosis not present

## 2021-09-10 DIAGNOSIS — M109 Gout, unspecified: Secondary | ICD-10-CM | POA: Diagnosis not present

## 2021-09-10 DIAGNOSIS — Z881 Allergy status to other antibiotic agents status: Secondary | ICD-10-CM | POA: Diagnosis not present

## 2021-09-10 DIAGNOSIS — E039 Hypothyroidism, unspecified: Secondary | ICD-10-CM | POA: Diagnosis not present

## 2021-09-10 DIAGNOSIS — Z880 Allergy status to penicillin: Secondary | ICD-10-CM | POA: Diagnosis not present

## 2021-09-10 DIAGNOSIS — Z008 Encounter for other general examination: Secondary | ICD-10-CM | POA: Diagnosis not present

## 2021-09-10 DIAGNOSIS — Z7901 Long term (current) use of anticoagulants: Secondary | ICD-10-CM | POA: Diagnosis not present

## 2021-09-10 DIAGNOSIS — K219 Gastro-esophageal reflux disease without esophagitis: Secondary | ICD-10-CM | POA: Diagnosis not present

## 2021-09-10 DIAGNOSIS — E669 Obesity, unspecified: Secondary | ICD-10-CM | POA: Diagnosis not present

## 2021-09-10 DIAGNOSIS — I4891 Unspecified atrial fibrillation: Secondary | ICD-10-CM | POA: Diagnosis not present

## 2021-09-10 DIAGNOSIS — Z6832 Body mass index (BMI) 32.0-32.9, adult: Secondary | ICD-10-CM | POA: Diagnosis not present

## 2021-09-17 ENCOUNTER — Other Ambulatory Visit: Payer: Self-pay | Admitting: Family Medicine

## 2021-09-17 DIAGNOSIS — K219 Gastro-esophageal reflux disease without esophagitis: Secondary | ICD-10-CM

## 2021-09-25 ENCOUNTER — Ambulatory Visit (INDEPENDENT_AMBULATORY_CARE_PROVIDER_SITE_OTHER): Payer: Medicare HMO | Admitting: Emergency Medicine

## 2021-09-25 DIAGNOSIS — E538 Deficiency of other specified B group vitamins: Secondary | ICD-10-CM

## 2021-09-25 DIAGNOSIS — D519 Vitamin B12 deficiency anemia, unspecified: Secondary | ICD-10-CM

## 2021-10-22 ENCOUNTER — Ambulatory Visit (INDEPENDENT_AMBULATORY_CARE_PROVIDER_SITE_OTHER): Payer: Medicare HMO | Admitting: Family Medicine

## 2021-10-22 ENCOUNTER — Encounter: Payer: Self-pay | Admitting: Family Medicine

## 2021-10-22 VITALS — BP 135/57 | HR 68 | Temp 97.9°F | Ht 66.0 in | Wt 206.0 lb

## 2021-10-22 DIAGNOSIS — E039 Hypothyroidism, unspecified: Secondary | ICD-10-CM | POA: Diagnosis not present

## 2021-10-22 DIAGNOSIS — I480 Paroxysmal atrial fibrillation: Secondary | ICD-10-CM | POA: Diagnosis not present

## 2021-10-22 DIAGNOSIS — K219 Gastro-esophageal reflux disease without esophagitis: Secondary | ICD-10-CM | POA: Diagnosis not present

## 2021-10-22 DIAGNOSIS — E538 Deficiency of other specified B group vitamins: Secondary | ICD-10-CM

## 2021-10-22 DIAGNOSIS — I10 Essential (primary) hypertension: Secondary | ICD-10-CM

## 2021-10-22 DIAGNOSIS — E782 Mixed hyperlipidemia: Secondary | ICD-10-CM

## 2021-10-22 DIAGNOSIS — E1169 Type 2 diabetes mellitus with other specified complication: Secondary | ICD-10-CM | POA: Diagnosis not present

## 2021-10-22 LAB — BAYER DCA HB A1C WAIVED: HB A1C (BAYER DCA - WAIVED): 6.6 % — ABNORMAL HIGH (ref 4.8–5.6)

## 2021-10-22 MED ORDER — PRAVASTATIN SODIUM 20 MG PO TABS: 20.0000 mg | ORAL_TABLET | Freq: Every day | ORAL | 3 refills | Status: DC

## 2021-10-22 MED ORDER — PANTOPRAZOLE SODIUM 40 MG PO TBEC: DELAYED_RELEASE_TABLET | ORAL | 3 refills | Status: DC

## 2021-10-22 MED ORDER — HYDROCORTISONE (PERIANAL) 2.5 % EX CREA
TOPICAL_CREAM | CUTANEOUS | 3 refills | Status: DC
Start: 2021-10-22 — End: 2022-08-16

## 2021-10-22 MED ORDER — LEVOTHYROXINE SODIUM 75 MCG PO TABS: 75.0000 ug | ORAL_TABLET | Freq: Every day | ORAL | 3 refills | Status: DC

## 2021-10-22 MED ORDER — METOPROLOL SUCCINATE ER 50 MG PO TB24: ORAL_TABLET | ORAL | 3 refills | Status: DC

## 2021-10-22 NOTE — Progress Notes (Signed)
BP (!) 135/57   Pulse 68   Temp 97.9 F (36.6 C)   Ht _0  (1.676 m)   Wt 206 lb (93.4 kg)   SpO2 94%   BMI 33.25 kg/m    Subjective:   Patient ID: Jenna [name], female    DOB: Sep 26, 1935, 86 y.o.   MRN: [id number]  HPI: Jenna [name] is a 86 y.o. female presenting on 10/22/2021 for Medical Management of Chronic Issues, Diabetes, Hypothyroidism, and Hyperlipidemia   HPI Type 2 diabetes mellitus Patient comes in today for recheck of his diabetes. Patient has been currently taking no medication, has been diet controlled, A1c 6.6.. Patient is not currently on an ACE inhibitor/ARB. Patient has not seen an ophthalmologist this year. Patient denies any issues with their feet. The symptom started onset as an adult hyperlipidemia and hypertension ARE RELATED TO DM   Hyperlipidemia Patient is coming in for recheck of his hyperlipidemia. The patient is currently taking pravastatin. They deny any issues with myalgias or history of liver damage from it. They deny any focal numbness or weakness or chest pain.   Hypothyroidism recheck Patient is coming in for thyroid recheck today as well. They deny any issues with hair changes or heat or cold problems or diarrhea or constipation. They deny any chest pain or palpitations. They are currently on levothyroxine 75 micrograms   Hypertension Patient is currently on metoprolol and furosemide, and their blood pressure today is 135/50. Patient denies any lightheadedness or dizziness. Patient denies headaches, blurred vision, chest pains, shortness of breath, or weakness. Denies any side effects from medication and is content with current medication.   Relevant past medical, surgical, family and social history reviewed and updated as indicated. Interim medical history since our last visit reviewed. Allergies and medications reviewed and updated.  Review of Systems  Constitutional:  Negative for chills and fever.  Eyes:  Negative for visual  disturbance.  Respiratory:  Negative for chest tightness and shortness of breath.   Cardiovascular:  Positive for leg swelling. Negative for chest pain and palpitations.  Genitourinary:  Negative for difficulty urinating and dysuria.  Musculoskeletal:  Negative for back pain and gait problem.  Skin:  Negative for rash.  Neurological:  Negative for light-headedness and headaches.  Psychiatric/Behavioral:  Negative for agitation and behavioral problems.   All other systems reviewed and are negative.  Per HPI unless specifically indicated above   Allergies as of 10/22/2021       Reactions   Daypro [oxaprozin]    Unknown reaction   Diclofenac Sodium    Unknown reaction   Effexor [venlafaxine Hydrochloride]    Unknown reaction   Lidocaine-prilocaine    Prozac [fluoxetine Hcl]    Unknown reaction   Amitriptyline Rash   Cymbalta [duloxetine Hcl] Nausea Only   Duloxetine Nausea Only   Lipitor [atorvastatin Calcium] Other (See Comments)   Leg aches   Penicillins Rash   Has patient had a PCN reaction causing immediate rash, facial/tongue/throat swelling, SOB or lightheadedness with hypotension: Yes Has patient had a PCN reaction causing severe rash involving mucus membranes or skin necrosis: No Has patient had a PCN reaction that required hospitalization: No Has patient had a PCN reaction occurring within the last 10 years: No If all of the above answers are "NO", then may proceed with Cephalosporin use.   Savella [milnacipran Hcl] Rash   Sulfa Antibiotics Rash   Zocor [simvastatin] Other (See Comments)   Hip pain  Medication List        Accurate as of October 22, 2021  2:23 PM. If you have any questions, ask your nurse or doctor.          acetaminophen 500 MG tablet Commonly known as: TYLENOL Take 500 mg by mouth daily as needed for moderate pain or headache.   albuterol 108 (90 Base) MCG/ACT inhaler Commonly known as: VENTOLIN HFA Inhale 2 puffs into the lungs  every 6 (six) hours as needed.   colchicine 0.6 MG tablet TAKE 1 TABLET DAILY AS NEEDED FOR GOUT FLARE UP   furosemide 40 MG tablet Commonly known as: LASIX Take 40 mg by mouth as needed.   hydrocortisone 2.5 % rectal cream Commonly known as: ANUSOL-HC APPLY RECTALLY DAILY AS NEEDED FOR HEMMORHOIDS OR ITCHING.   ipratropium 0.06 % nasal spray Commonly known as: ATROVENT 2 sprays into each nostril Three (3) times a day.   levothyroxine 75 MCG tablet Commonly known as: SYNTHROID Take 1 tablet (75 mcg total) by mouth daily.   metoprolol succinate 50 MG 24 hr tablet Commonly known as: TOPROL-XL Take with or immediately following a meal. What changed: See the new instructions. Changed by: Fransisca Kaufmann [place], MD   nitroGLYCERIN 0.4 MG SL tablet Commonly known as: NITROSTAT PLACE 1 TAB UNDER TONGUE EVERY 3 MINUTES AS DIRECTED UP TO 3 TIMES AS NEEDED FOR CHEST PAIN.   pantoprazole 40 MG tablet Commonly known as: PROTONIX TAKE 1 TABLET ONCE DAILY BEFORE A MEAL.   pravastatin 20 MG tablet Commonly known as: PRAVACHOL Take 1 tablet (20 mg total) by mouth daily.   rivaroxaban 20 MG Tabs tablet Commonly known as: Xarelto Take 1 tablet (20 mg total) by mouth daily with supper.   Vitamin D3 50 MCG (2000 UT) Tabs Take 2,000 Units by mouth daily.         Objective:   BP (!) 135/57   Pulse 68   Temp 97.9 F (36.6 C)   Ht $R'5\' 6"'JS$  (1.676 m)   Wt 206 lb (93.4 kg)   SpO2 94%   BMI 33.25 kg/m   Wt Readings from Last 3 Encounters:  10/22/21 206 lb (93.4 kg)  07/01/21 207 lb 6.4 oz (94.1 kg)  04/30/21 206 lb 9.6 oz (93.7 kg)    Physical Exam Vitals and nursing note reviewed.  Constitutional:      General: She is not in acute distress.    Appearance: She is well-developed. She is not diaphoretic.  Eyes:     Conjunctiva/sclera: Conjunctivae normal.  Cardiovascular:     Rate and Rhythm: Normal rate and regular rhythm.     Heart sounds: Normal heart sounds. No murmur  heard. Pulmonary:     Effort: Pulmonary effort is normal. No respiratory distress.     Breath sounds: Normal breath sounds. No wheezing.  Musculoskeletal:        General: No tenderness. Normal range of motion.  Skin:    General: Skin is warm and dry.     Findings: No rash.  Neurological:     Mental Status: She is alert and oriented to person, place, and time.     Coordination: Coordination normal.  Psychiatric:        Behavior: Behavior normal.      Assessment & Plan:   Problem List Items Addressed This Visit       Cardiovascular and Mediastinum   AF (paroxysmal atrial fibrillation) (HCC)   Relevant Medications   metoprolol succinate (TOPROL-XL) 50 MG  24 hr tablet   pravastatin (PRAVACHOL) 20 MG tablet   Hypertension   Relevant Medications   metoprolol succinate (TOPROL-XL) 50 MG 24 hr tablet   pravastatin (PRAVACHOL) 20 MG tablet     Endocrine   Hypothyroidism   Relevant Medications   metoprolol succinate (TOPROL-XL) 50 MG 24 hr tablet   levothyroxine (SYNTHROID) 75 MCG tablet   Other Relevant Orders   CBC with Differential/Platelet   CMP14+EGFR   Lipid panel   TSH   Bayer DCA Hb A1c Waived   Vitamin B12   Type 2 diabetes mellitus with other specified complication (HCC) - Primary   Relevant Medications   pravastatin (PRAVACHOL) 20 MG tablet   Other Relevant Orders   CBC with Differential/Platelet   CMP14+EGFR   Lipid panel   TSH   Bayer DCA Hb A1c Waived   Vitamin B12   Microalbumin / creatinine urine ratio     Other   Hyperlipidemia   Relevant Medications   metoprolol succinate (TOPROL-XL) 50 MG 24 hr tablet   pravastatin (PRAVACHOL) 20 MG tablet   Other Relevant Orders   CBC with Differential/Platelet   CMP14+EGFR   Lipid panel   TSH   Bayer DCA Hb A1c Waived   Other Visit Diagnoses     B12 deficiency       Relevant Orders   CBC with Differential/Platelet   CMP14+EGFR   Lipid panel   TSH   Bayer DCA Hb A1c Waived   Vitamin B12    Gastroesophageal reflux disease       Relevant Medications   pantoprazole (PROTONIX) 40 MG tablet       A1c looks good at 6.6, blood pressures look good and are within range, would not change anything at this point. Continue current medicine, no changes Follow up plan: Return in about 3 months (around 01/22/2022), or if symptoms worsen or fail to improve, for Thyroid and diabetes and cholesterol.  Counseling provided for all of the vaccine components Orders Placed This Encounter  Procedures   CBC with Differential/Platelet   CMP14+EGFR   Lipid panel   TSH   Bayer DCA Hb A1c Waived   Vitamin B12   Microalbumin / creatinine urine ratio    Caryl Pina, MD [facility] 10/22/2021, 2:23 PM

## 2021-10-23 LAB — CBC WITH DIFFERENTIAL/PLATELET
Basophils Absolute: 0 10*3/uL (ref 0.0–0.2)
Basos: 1 %
EOS (ABSOLUTE): 0.2 10*3/uL (ref 0.0–0.4)
Eos: 2 %
Hematocrit: 40.1 % (ref 34.0–46.6)
Hemoglobin: 13.5 g/dL (ref 11.1–15.9)
Immature Grans (Abs): 0 10*3/uL (ref 0.0–0.1)
Immature Granulocytes: 0 %
Lymphocytes Absolute: 1.2 10*3/uL (ref 0.7–3.1)
Lymphs: 18 %
MCH: 32.3 pg (ref 26.6–33.0)
MCHC: 33.7 g/dL (ref 31.5–35.7)
MCV: 96 fL (ref 79–97)
Monocytes Absolute: 0.6 10*3/uL (ref 0.1–0.9)
Monocytes: 9 %
Neutrophils Absolute: 4.6 10*3/uL (ref 1.4–7.0)
Neutrophils: 70 %
Platelets: 179 10*3/uL (ref 150–450)
RBC: 4.18 x10E6/uL (ref 3.77–5.28)
RDW: 12.9 % (ref 11.7–15.4)
WBC: 6.7 10*3/uL (ref 3.4–10.8)

## 2021-10-23 LAB — CMP14+EGFR
ALT: 10 IU/L (ref 0–32)
AST: 12 IU/L (ref 0–40)
Albumin/Globulin Ratio: 2.3 — ABNORMAL HIGH (ref 1.2–2.2)
Albumin: 4.3 g/dL (ref 3.6–4.6)
Alkaline Phosphatase: 99 IU/L (ref 44–121)
BUN/Creatinine Ratio: 18 (ref 12–28)
BUN: 12 mg/dL (ref 8–27)
Bilirubin Total: 0.7 mg/dL (ref 0.0–1.2)
CO2: 23 mmol/L (ref 20–29)
Calcium: 9.2 mg/dL (ref 8.7–10.3)
Chloride: 98 mmol/L (ref 96–106)
Creatinine, Ser: 0.66 mg/dL (ref 0.57–1.00)
Globulin, Total: 1.9 g/dL (ref 1.5–4.5)
Glucose: 140 mg/dL — ABNORMAL HIGH (ref 70–99)
Potassium: 4.3 mmol/L (ref 3.5–5.2)
Sodium: 137 mmol/L (ref 134–144)
Total Protein: 6.2 g/dL (ref 6.0–8.5)
eGFR: 86 mL/min/{1.73_m2} (ref >59)

## 2021-10-23 LAB — LIPID PANEL
Chol/HDL Ratio: 4.2 ratio (ref 0.0–4.4)
Cholesterol, Total: 212 mg/dL — ABNORMAL HIGH (ref 100–199)
HDL: 51 mg/dL (ref >39)
LDL Chol Calc (NIH): 124 mg/dL — ABNORMAL HIGH (ref 0–99)
Triglycerides: 212 mg/dL — ABNORMAL HIGH (ref 0–149)
VLDL Cholesterol Cal: 37 mg/dL (ref 5–40)

## 2021-10-23 LAB — MICROALBUMIN / CREATININE URINE RATIO
Creatinine, Urine: 180.8 mg/dL
Microalb/Creat Ratio: 10 mg/g creat (ref 0–29)
Microalbumin, Urine: 17.3 ug/mL

## 2021-10-23 LAB — VITAMIN B12: Vitamin B-12: 517 pg/mL (ref 232–1245)

## 2021-10-23 LAB — TSH: TSH: 4.37 u[IU]/mL (ref 0.450–4.500)

## 2021-10-26 ENCOUNTER — Ambulatory Visit (INDEPENDENT_AMBULATORY_CARE_PROVIDER_SITE_OTHER): Payer: Medicare HMO | Admitting: Emergency Medicine

## 2021-10-26 DIAGNOSIS — E538 Deficiency of other specified B group vitamins: Secondary | ICD-10-CM

## 2021-10-26 DIAGNOSIS — D519 Vitamin B12 deficiency anemia, unspecified: Secondary | ICD-10-CM

## 2021-11-19 ENCOUNTER — Encounter: Payer: Self-pay | Admitting: Cardiology

## 2021-11-19 ENCOUNTER — Ambulatory Visit (INDEPENDENT_AMBULATORY_CARE_PROVIDER_SITE_OTHER): Payer: Medicare HMO | Admitting: Cardiology

## 2021-11-19 VITALS — BP 148/72 | HR 65 | Ht 65.0 in | Wt 206.8 lb

## 2021-11-19 DIAGNOSIS — I480 Paroxysmal atrial fibrillation: Secondary | ICD-10-CM | POA: Diagnosis not present

## 2021-11-19 DIAGNOSIS — I890 Lymphedema, not elsewhere classified: Secondary | ICD-10-CM | POA: Diagnosis not present

## 2021-11-19 NOTE — Addendum Note (Signed)
Addended by: Sung Amabile on: 11/19/2021 04:48 PM   Modules accepted: Orders

## 2021-11-19 NOTE — Progress Notes (Signed)
Cardiology Office Note  Date: 11/19/2021   ID: Jacoria, Keiffer 12-07-1935, MRN [id number]  PCP:  Dettinger, Fransisca Kaufmann, MD  Cardiologist:  Rozann Lesches, MD Electrophysiologist:  None   Chief Complaint  Patient presents with   Cardiac follow-up    History of Present Illness: Jenna [name] is an 86 y.o. female last seen in December 2022.  She is here for a follow-up visit.  Reports no palpitations or chest discomfort with activities.  Still functional with ADLs, she and her husband split up chores at home.  We went over her medications.  She continues on Xarelto, does not report any spontaneous bleeding problems.  Also on Toprol-XL.  I reviewed her recent lab work.  ECG today shows sinus rhythm.  Past Medical History:  Diagnosis Date   Cataract    DDD (degenerative disc disease)    Fibromyalgia    Herpes zoster    History of skin cancer    Hyperlipidemia    Hypertension    Hypothyroidism    Iron deficiency anemia due to chronic blood loss 04/06/2018   OA (osteoarthritis)    OSA on CPAP 01/16/2018   Mild OSA overall with an AHI of 5.8/h but during REM sleep moderate with an AHI of 28/h.  Oxygen saturations dropped to 76% during respiratory events.  Now on CPAP at 9 cm H2O    Osteoporosis    Palpitations    Pre-diabetes    PUD (peptic ulcer disease)    Skin cancer of nose    Jenna [name]    Past Surgical History:  Procedure Laterality Date   ABDOMINAL HYSTERECTOMY     partial   BIOPSY  04/24/2018   Procedure: BIOPSY;  Surgeon: Rogene Houston, MD;  Location: [facility];  Service: Endoscopy;;  gastric body ulcerated surface   BREAST BIOPSY     Right   CATARACT EXTRACTION W/PHACO  10/28/2011   Procedure: CATARACT EXTRACTION PHACO AND INTRAOCULAR LENS PLACEMENT ([place]);  Surgeon: Tonny Branch, MD;  Location: [facility];  Service: Ophthalmology;  Laterality: Right;  CDE 18.38   CATARACT EXTRACTION W/PHACO  02/14/2012   Procedure: CATARACT EXTRACTION PHACO AND  INTRAOCULAR LENS PLACEMENT ([facility]);  Surgeon: Tonny Branch, MD;  Location: [facility];  Service: Ophthalmology;  Laterality: Left;  CDE 17.20   CESAREAN SECTION     COLONOSCOPY N/A 05/31/2013   Procedure: COLONOSCOPY;  Surgeon: Rogene Houston, MD;  Location: [facility];  Service: Endoscopy;  Laterality: N/A;  240   COLONOSCOPY N/A 04/24/2018   Rehman: - The examined portion of the ileum was normal. diverticulosos in sigmoid, external hemorrhoids, anal papilla hypertrophied, no specimens   ESOPHAGOGASTRODUODENOSCOPY N/A 04/24/2018   rehman: normal esophagus, z line irregular 41 cm from incosors, single gastric polyp with ulcerated surface, clip placed (MR conditional), normal duodenal bulb second portion of duodenum   EYE SURGERY     skin removal    FRACTURE SURGERY  1952   fractured right arm   LEFT HEART CATH AND CORONARY ANGIOGRAPHY N/A 09/25/2018   Procedure: LEFT HEART CATH AND CORONARY ANGIOGRAPHY;  Surgeon: Jettie Booze, MD;  Location: [facility];  Service: Cardiovascular;  Laterality: N/A;    Current Outpatient Medications  Medication Sig Dispense Refill   acetaminophen (TYLENOL) 500 MG tablet Take 500 mg by mouth daily as needed for moderate pain or headache.     albuterol (VENTOLIN HFA) 108 (90 Base) MCG/ACT inhaler Inhale 2 puffs into the lungs  every 6 (six) hours as needed. 18 g 1   Cholecalciferol (VITAMIN D3) 2000 UNITS TABS Take 2,000 Units by mouth daily.     colchicine 0.6 MG tablet TAKE 1 TABLET DAILY AS NEEDED FOR GOUT FLARE UP 30 tablet 5   furosemide (LASIX) 40 MG tablet Take 40 mg by mouth as needed.      hydrocortisone (ANUSOL-HC) 2.5 % rectal cream APPLY RECTALLY DAILY AS NEEDED FOR HEMMORHOIDS OR ITCHING. 30 g 3   ipratropium (ATROVENT) 0.06 % nasal spray 2 sprays into each nostril Three (3) times a day.     levothyroxine (SYNTHROID) 75 MCG tablet Take 1 tablet (75 mcg total) by mouth daily. 90 tablet 3   metoprolol succinate (TOPROL-XL) 50 MG 24 hr  tablet Take with or immediately following a meal. 90 tablet 3   nitroGLYCERIN (NITROSTAT) 0.4 MG SL tablet PLACE 1 TAB UNDER TONGUE EVERY 3 MINUTES AS DIRECTED UP TO 3 TIMES AS NEEDED FOR CHEST PAIN. 25 tablet 2   pantoprazole (PROTONIX) 40 MG tablet TAKE 1 TABLET ONCE DAILY BEFORE A MEAL. 180 tablet 3   pravastatin (PRAVACHOL) 20 MG tablet Take 1 tablet (20 mg total) by mouth daily. 90 tablet 3   rivaroxaban (XARELTO) 20 MG TABS tablet Take 1 tablet (20 mg total) by mouth daily with supper. 90 tablet 3   Current Facility-Administered Medications  Medication Dose Route Frequency Provider Last Rate Last Admin   cyanocobalamin ((VITAMIN B-12)) injection 1,000 mcg  1,000 mcg Intramuscular Q30 days Dettinger, Fransisca Kaufmann, MD   1,000 mcg at 10/26/21 1600   Allergies:  Daypro [oxaprozin], Diclofenac sodium, Effexor [venlafaxine hydrochloride], Lidocaine-prilocaine, Prozac [fluoxetine hcl], Amitriptyline, Cymbalta [duloxetine hcl], Duloxetine, Lipitor [atorvastatin calcium], Penicillins, Savella [milnacipran hcl], Sulfa antibiotics, and Zocor [simvastatin]   ROS: No orthopnea or PND.  No worsening leg edema.  Physical Exam: VS:  BP (!) 148/72   Pulse 65   Ht '5\' 5"'$  (1.651 m)   Wt 206 lb 12.8 oz (93.8 kg)   SpO2 94%   BMI 34.41 kg/m , BMI Body mass index is 34.41 kg/m.  Wt Readings from Last 3 Encounters:  11/19/21 206 lb 12.8 oz (93.8 kg)  10/22/21 206 lb (93.4 kg)  07/01/21 207 lb 6.4 oz (94.1 kg)    General: Patient appears comfortable at rest. HEENT: Conjunctiva and lids normal, oropharynx clear. Neck: Supple, no elevated JVP or carotid bruits, no thyromegaly. Lungs: Clear to auscultation, nonlabored breathing at rest. Cardiac: Regular rate and rhythm, no S3 or significant systolic murmur. Extremities: No pitting edema.  ECG:  An ECG dated 09/04/2020 was personally reviewed today and demonstrated:  Sinus rhythm.  Recent Labwork: 10/22/2021: ALT 10; AST 12; BUN 12; Creatinine, Ser 0.66;  Hemoglobin 13.5; Platelets 179; Potassium 4.3; Sodium 137; TSH 4.370     Component Value Date/Time   CHOL 212 (H) 10/22/2021 1303   TRIG 212 (H) 10/22/2021 1303   TRIG 161 (H) 10/14/2016 1010   HDL 51 10/22/2021 1303   HDL 54 10/14/2016 1010   CHOLHDL 4.2 10/22/2021 1303   CHOLHDL 3.6 10/16/2008 0540   VLDL 23 10/16/2008 0540   LDLCALC 124 (H) 10/22/2021 1303   LDLCALC 138 (H) 02/26/2014 1001    Other Studies Reviewed Today:  Echocardiogram 03/29/2018: Study Conclusions   - Left ventricle: The cavity size was normal. Wall thickness was   increased in a pattern of mild LVH. Systolic function was   vigorous. The estimated ejection fraction was in the range of 65%  to 70%. Wall motion was normal; there were no regional wall   motion abnormalities. Left ventricular diastolic function   parameters were normal. - Aortic valve: There was mild regurgitation. Valve area (VTI):   2.92 cm^2. Valve area (Vmax): 2.78 cm^2. Valve area (Vmean): 2.95   cm^2.   Cardiac catheterization 09/25/2018: Prox LAD lesion is 20% stenosed. Mid RCA lesion is 10% stenosed. The left ventricular systolic function is normal. LV end diastolic pressure is normal. The left ventricular ejection fraction is 55-65% by visual estimate. There is no aortic valve stenosis.   Nonobstructive CAD.    Assessment and Plan:  1.  Paroxysmal atrial fibrillation/flutter with CHA2DS2-VASc score of 3.  She is doing well at this time without significant palpitations and is in sinus rhythm today.  Continue Toprol-XL along with Xarelto.  I reviewed her recent lab work.  2.  History of lymphedema.  She has not used Lasix with any regularity.  Also not using compression stockings at this point.  Medication Adjustments/Labs and Tests Ordered: Current medicines are reviewed at length with the patient today.  Concerns regarding medicines are outlined above.   Tests Ordered: No orders of the defined types were placed in this  encounter.   Medication Changes: No orders of the defined types were placed in this encounter.   Disposition:  Follow up  6 months.  Signed, Satira Sark, MD, [facility] 11/19/2021 4:24 PM    [facility], [place], [place] [address] Phone: [phone]; Fax: [phone]

## 2021-11-19 NOTE — Patient Instructions (Signed)

## 2021-11-25 ENCOUNTER — Ambulatory Visit (INDEPENDENT_AMBULATORY_CARE_PROVIDER_SITE_OTHER): Payer: Medicare HMO

## 2021-11-25 DIAGNOSIS — E538 Deficiency of other specified B group vitamins: Secondary | ICD-10-CM | POA: Diagnosis not present

## 2021-11-25 NOTE — Progress Notes (Signed)
Cyanocobalamin injection given to right deltoid.  Patient tolerated well. 

## 2021-12-04 DIAGNOSIS — S90569A Insect bite (nonvenomous), unspecified ankle, initial encounter: Secondary | ICD-10-CM | POA: Diagnosis not present

## 2021-12-04 DIAGNOSIS — R21 Rash and other nonspecific skin eruption: Secondary | ICD-10-CM | POA: Diagnosis not present

## 2021-12-23 ENCOUNTER — Inpatient Hospital Stay: Payer: Medicare HMO | Attending: Physician Assistant

## 2021-12-23 ENCOUNTER — Ambulatory Visit: Payer: Medicare HMO

## 2021-12-23 DIAGNOSIS — D509 Iron deficiency anemia, unspecified: Secondary | ICD-10-CM | POA: Insufficient documentation

## 2021-12-23 DIAGNOSIS — D500 Iron deficiency anemia secondary to blood loss (chronic): Secondary | ICD-10-CM

## 2021-12-23 LAB — CBC WITH DIFFERENTIAL/PLATELET
Abs Immature Granulocytes: 0.01 10*3/uL (ref 0.00–0.07)
Basophils Absolute: 0 10*3/uL (ref 0.0–0.1)
Basophils Relative: 1 %
Eosinophils Absolute: 0.1 10*3/uL (ref 0.0–0.5)
Eosinophils Relative: 2 %
HCT: 39.1 % (ref 36.0–46.0)
Hemoglobin: 13.6 g/dL (ref 12.0–15.0)
Immature Granulocytes: 0 %
Lymphocytes Relative: 20 %
Lymphs Abs: 1.2 10*3/uL (ref 0.7–4.0)
MCH: 33.3 pg (ref 26.0–34.0)
MCHC: 34.8 g/dL (ref 30.0–36.0)
MCV: 95.8 fL (ref 80.0–100.0)
Monocytes Absolute: 0.5 10*3/uL (ref 0.1–1.0)
Monocytes Relative: 9 %
Neutro Abs: 4.1 10*3/uL (ref 1.7–7.7)
Neutrophils Relative %: 68 %
Platelets: 177 10*3/uL (ref 150–400)
RBC: 4.08 MIL/uL (ref 3.87–5.11)
RDW: 12.1 % (ref 11.5–15.5)
WBC: 6.1 10*3/uL (ref 4.0–10.5)
nRBC: 0 % (ref 0.0–0.2)

## 2021-12-23 LAB — IRON AND TIBC
Iron: 81 ug/dL (ref 28–170)
Saturation Ratios: 25 % (ref 10.4–31.8)
TIBC: 321 ug/dL (ref 250–450)
UIBC: 240 ug/dL

## 2021-12-23 LAB — FERRITIN: Ferritin: 146 ng/mL (ref 11–307)

## 2021-12-25 ENCOUNTER — Ambulatory Visit (INDEPENDENT_AMBULATORY_CARE_PROVIDER_SITE_OTHER): Payer: Medicare HMO | Admitting: Family Medicine

## 2021-12-25 DIAGNOSIS — Z029 Encounter for administrative examinations, unspecified: Secondary | ICD-10-CM

## 2021-12-25 DIAGNOSIS — E538 Deficiency of other specified B group vitamins: Secondary | ICD-10-CM

## 2021-12-25 NOTE — Progress Notes (Signed)
B12 GIVEN.

## 2021-12-30 ENCOUNTER — Inpatient Hospital Stay: Payer: Medicare HMO | Admitting: Physician Assistant

## 2021-12-30 ENCOUNTER — Inpatient Hospital Stay (HOSPITAL_BASED_OUTPATIENT_CLINIC_OR_DEPARTMENT_OTHER): Payer: Medicare HMO | Admitting: Physician Assistant

## 2021-12-30 DIAGNOSIS — E538 Deficiency of other specified B group vitamins: Secondary | ICD-10-CM | POA: Diagnosis not present

## 2021-12-30 DIAGNOSIS — D500 Iron deficiency anemia secondary to blood loss (chronic): Secondary | ICD-10-CM | POA: Diagnosis not present

## 2021-12-30 NOTE — Progress Notes (Deleted)
Change to phone visit  .

## 2021-12-30 NOTE — Progress Notes (Signed)
Virtual Visit via Telephone Note [facility]  I connected with Jenna [name] on 12/30/21 at  1:29 PM by telephone and verified that I am speaking with the correct person using two identifiers.  Location: Patient: Home Provider: [facility]   I discussed the limitations, risks, security and privacy concerns of performing an evaluation and management service by telephone and the availability of in person appointments. I also discussed with the patient that there may be a patient responsible charge related to this service. The patient expressed understanding and agreed to proceed.  REASON FOR VISIT:  Follow-up for iron deficiency anemia   PRIOR THERAPY: None   CURRENT THERAPY: Intermittent Feraheme (last on 03/07/2020)  INTERVAL HISTORY:  Jenna [name] 86 y.o. female returns for routine follow-up of her iron deficiency anemia.  She was last seen by Tarri Abernethy PA-C on 07/01/2021.  At today's visit, she reports feeling fair.  No recent hospitalizations, surgeries, or changes in baseline health status.  Patient had some improvement in her energy level after her IV iron in February/March 2023, but is starting to feel somewhat fatigued again in keeping with her baseline chronic fatigue. She reports occasional scant rectal bleeding from hemorrhoids, but no gross rectal hemorrhage, melena, epistaxis, or other sources of bleeding.  She reports restless legs at night as well as dyspnea on exertion.  She denies any pica, headaches, chest pain, lightheadedness, or syncope.  No B symptoms such as fever, chills, night sweats, or unintentional weight loss.  She has 50% energy and 100% appetite. She endorses that she is maintaining a stable weight.    OBSERVATIONS/OBJECTIVE: Review of Systems  Constitutional:  Positive for malaise/fatigue. Negative for chills, diaphoresis, fever and weight loss.  Respiratory:  Positive for shortness of breath (with exertion).  Negative for cough.   Cardiovascular:  Positive for leg swelling (feet). Negative for chest pain and palpitations.  Gastrointestinal:  Negative for abdominal pain, blood in stool, melena, nausea and vomiting.  Neurological:  Negative for dizziness and headaches.     PHYSICAL EXAM (per limitations of virtual telephone visit): The patient is alert and oriented x 3, exhibiting adequate mentation, good mood, and ability to speak in full sentences and execute sound judgement.   ASSESSMENT & PLAN: 1.  Iron deficiency anemia - Initial evaluation on 03/15/2018 showed Hgb 8.9, platelets 267, ferritin 12 - EGD and colonoscopy on 04/24/2018 showed single gastric polyp with ulcerated surface, external hemorrhoids and diverticulosis - Last IV iron with Venofer 300 mg x 3 from 07/02/2021 through 07/24/2021 - She was unable to tolerate oral iron due to stomach pain and constipation - Patient continues to have fatigue and low energy.     - She denies any bright red blood per rectum or melena.     - Most recent labs (12/23/2021): Hgb 13.6, ferritin 146, iron saturation 25% - PLAN: No indication for IV iron at this time - Repeat labs and RTC in 6 months with office visit after  2.  B12 deficiency - Receives monthly B12 injections from her PCP (Dr. Warrick Parisian) - Most recent labs (06/24/2021): Normal methylmalonic acid, B12 950 - PLAN: Continue monthly B12 injections.  Recheck B12/methylmalonic acid annually.   FOLLOW UP INSTRUCTIONS: Labs in 6 months Office visit 1 week after labs    I discussed the assessment and treatment plan with the patient. The patient was provided an opportunity to ask questions and all were answered. The patient agreed with the plan and demonstrated  an understanding of the instructions.   The patient was advised to call back or seek an in-person evaluation if the symptoms worsen or if the condition fails to improve as anticipated.  I provided 17 minutes of non-face-to-face time during  this encounter.   Harriett Rush, PA-C 12/30/21 2:03 PM

## 2022-01-11 ENCOUNTER — Other Ambulatory Visit: Payer: Self-pay | Admitting: Family Medicine

## 2022-01-11 DIAGNOSIS — I480 Paroxysmal atrial fibrillation: Secondary | ICD-10-CM

## 2022-01-11 DIAGNOSIS — I10 Essential (primary) hypertension: Secondary | ICD-10-CM

## 2022-01-22 ENCOUNTER — Ambulatory Visit (INDEPENDENT_AMBULATORY_CARE_PROVIDER_SITE_OTHER): Payer: Medicare HMO | Admitting: Family Medicine

## 2022-01-22 ENCOUNTER — Encounter: Payer: Self-pay | Admitting: Family Medicine

## 2022-01-22 VITALS — BP 130/63 | HR 70 | Temp 98.0°F | Ht 65.0 in | Wt 206.0 lb

## 2022-01-22 DIAGNOSIS — I10 Essential (primary) hypertension: Secondary | ICD-10-CM | POA: Diagnosis not present

## 2022-01-22 DIAGNOSIS — E782 Mixed hyperlipidemia: Secondary | ICD-10-CM

## 2022-01-22 DIAGNOSIS — E039 Hypothyroidism, unspecified: Secondary | ICD-10-CM | POA: Diagnosis not present

## 2022-01-22 DIAGNOSIS — E1169 Type 2 diabetes mellitus with other specified complication: Secondary | ICD-10-CM

## 2022-01-22 LAB — BAYER DCA HB A1C WAIVED: HB A1C (BAYER DCA - WAIVED): 6.2 % — ABNORMAL HIGH (ref 4.8–5.6)

## 2022-01-22 MED ORDER — RIVAROXABAN 20 MG PO TABS: 20.0000 mg | ORAL_TABLET | Freq: Every day | ORAL | 3 refills | Status: DC

## 2022-01-22 MED ORDER — COLCHICINE 0.6 MG PO TABS: ORAL_TABLET | ORAL | 5 refills | Status: DC

## 2022-01-22 NOTE — Progress Notes (Signed)
BP 130/63   Pulse 70   Temp 98 F (36.7 C)   Ht '5\' 5"'$  (1.651 m)   Wt 206 lb (93.4 kg)   SpO2 93%   BMI 34.28 kg/m    Subjective:   Patient ID: Jenna [name], female    DOB: 1935/08/02, 86 y.o.   MRN: [id number]  HPI: Jenna [name] is a 86 y.o. female presenting on 01/22/2022 for Medical Management of Chronic Issues, Hypothyroidism, Hyperlipidemia, and Diabetes   HPI Type 2 diabetes mellitus Patient comes in today for recheck of his diabetes. Patient has been currently taking diet controlled, A1c looks good at 6.2. Patient is not currently on an ACE inhibitor/ARB. Patient has not seen an ophthalmologist this year. Patient denies any issues with their feet. The symptom started onset as an adult hypertension and hyperlipidemia ARE RELATED TO DM   Hypertension Patient is currently on metoprolol, and their blood pressure today is 130/63. Patient denies any lightheadedness or dizziness. Patient denies headaches, blurred vision, chest pains, shortness of breath, or weakness. Denies any side effects from medication and is content with current medication.   Hypothyroidism recheck Patient is coming in for thyroid recheck today as well. They deny any issues with hair changes or heat or cold problems or diarrhea or constipation. They deny any chest pain or palpitations. They are currently on levothyroxine 75 micrograms   Hyperlipidemia Patient is coming in for recheck of his hyperlipidemia. The patient is currently taking pravastatin. They deny any issues with myalgias or history of liver damage from it. They deny any focal numbness or weakness or chest pain.   Relevant past medical, surgical, family and social history reviewed and updated as indicated. Interim medical history since our last visit reviewed. Allergies and medications reviewed and updated.  Review of Systems  Constitutional:  Negative for chills and fever.  Eyes:  Negative for visual disturbance.  Respiratory:  Negative  for chest tightness and shortness of breath.   Cardiovascular:  Negative for chest pain and leg swelling.  Skin:  Negative for rash.  Neurological:  Negative for dizziness, light-headedness and headaches.  Psychiatric/Behavioral:  Negative for agitation, behavioral problems, self-injury, sleep disturbance and suicidal ideas. The patient is not nervous/anxious.   All other systems reviewed and are negative.   Per HPI unless specifically indicated above   Allergies as of 01/22/2022       Reactions   Daypro [oxaprozin]    Unknown reaction   Diclofenac Sodium    Unknown reaction   Effexor [venlafaxine Hydrochloride]    Unknown reaction   Lidocaine-prilocaine    Prozac [fluoxetine Hcl]    Unknown reaction   Amitriptyline Rash   Cymbalta [duloxetine Hcl] Nausea Only   Duloxetine Nausea Only   Lipitor [atorvastatin Calcium] Other (See Comments)   Leg aches   Penicillins Rash   Has patient had a PCN reaction causing immediate rash, facial/tongue/throat swelling, SOB or lightheadedness with hypotension: Yes Has patient had a PCN reaction causing severe rash involving mucus membranes or skin necrosis: No Has patient had a PCN reaction that required hospitalization: No Has patient had a PCN reaction occurring within the last 10 years: No If all of the above answers are "NO", then may proceed with Cephalosporin use.   Savella [milnacipran Hcl] Rash   Sulfa Antibiotics Rash   Zocor [simvastatin] Other (See Comments)   Hip pain        Medication List        Accurate as of  January 22, 2022  2:40 PM. If you have any questions, ask your nurse or doctor.          STOP taking these medications    furosemide 40 MG tablet Commonly known as: LASIX Stopped by: Fransisca Kaufmann Gissela Bloch, MD       TAKE these medications    acetaminophen 500 MG tablet Commonly known as: TYLENOL Take 500 mg by mouth daily as needed for moderate pain or headache.   albuterol 108 (90 Base) MCG/ACT  inhaler Commonly known as: VENTOLIN HFA Inhale 2 puffs into the lungs every 6 (six) hours as needed.   colchicine 0.6 MG tablet TAKE 1 TABLET DAILY AS NEEDED FOR GOUT FLARE UP   hydrocortisone 2.5 % rectal cream Commonly known as: ANUSOL-HC APPLY RECTALLY DAILY AS NEEDED FOR HEMMORHOIDS OR ITCHING.   ipratropium 0.06 % nasal spray Commonly known as: ATROVENT 2 sprays into each nostril Three (3) times a day.   levothyroxine 75 MCG tablet Commonly known as: SYNTHROID Take 1 tablet (75 mcg total) by mouth daily.   metoprolol succinate 50 MG 24 hr tablet Commonly known as: TOPROL-XL Take with or immediately following a meal.   nitroGLYCERIN 0.4 MG SL tablet Commonly known as: NITROSTAT PLACE 1 TAB UNDER TONGUE EVERY 3 MINUTES AS DIRECTED UP TO 3 TIMES AS NEEDED FOR CHEST PAIN.   pantoprazole 40 MG tablet Commonly known as: PROTONIX TAKE 1 TABLET ONCE DAILY BEFORE A MEAL.   pravastatin 20 MG tablet Commonly known as: PRAVACHOL Take 1 tablet (20 mg total) by mouth daily.   rivaroxaban 20 MG Tabs tablet Commonly known as: Xarelto Take 1 tablet (20 mg total) by mouth daily with supper.   Vitamin D3 50 MCG (2000 UT) Tabs Take 2,000 Units by mouth daily.         Objective:   BP 130/63   Pulse 70   Temp 98 F (36.7 C)   Ht '5\' 5"'$  (1.651 m)   Wt 206 lb (93.4 kg)   SpO2 93%   BMI 34.28 kg/m   Wt Readings from Last 3 Encounters:  01/22/22 206 lb (93.4 kg)  11/19/21 206 lb 12.8 oz (93.8 kg)  10/22/21 206 lb (93.4 kg)    Physical Exam Vitals and nursing note reviewed.  Constitutional:      General: She is not in acute distress.    Appearance: She is well-developed. She is not diaphoretic.  Eyes:     Conjunctiva/sclera: Conjunctivae normal.  Cardiovascular:     Rate and Rhythm: Normal rate and regular rhythm.     Heart sounds: Normal heart sounds. No murmur heard. Pulmonary:     Effort: Pulmonary effort is normal. No respiratory distress.     Breath sounds:  Normal breath sounds. No wheezing.  Musculoskeletal:        General: No swelling or tenderness. Normal range of motion.  Skin:    General: Skin is warm and dry.     Findings: No rash.  Neurological:     Mental Status: She is alert and oriented to person, place, and time.     Coordination: Coordination normal.  Psychiatric:        Behavior: Behavior normal.       Assessment & Plan:   Problem List Items Addressed This Visit       Cardiovascular and Mediastinum   Hypertension   Relevant Medications   rivaroxaban (XARELTO) 20 MG TABS tablet     Endocrine   Hypothyroidism   Type  2 diabetes mellitus with other specified complication ([place]) - Primary   Relevant Orders   Bayer DCA Hb A1c Waived     Other   Hyperlipidemia   Relevant Medications   rivaroxaban (XARELTO) 20 MG TABS tablet    A1c looks good at 6.2, no change in medication, seems to be doing well. Follow up plan: Return in about 3 months (around 04/23/2022), or if symptoms worsen or fail to improve, for diabetes and htn.  Counseling provided for all of the vaccine components Orders Placed This Encounter  Procedures   Bayer Wilber Hb A1c [place] Brita Jurgensen, MD [facility] 01/22/2022, 2:40 PM

## 2022-01-26 ENCOUNTER — Ambulatory Visit (INDEPENDENT_AMBULATORY_CARE_PROVIDER_SITE_OTHER): Payer: Medicare HMO | Admitting: *Deleted

## 2022-01-26 DIAGNOSIS — E538 Deficiency of other specified B group vitamins: Secondary | ICD-10-CM

## 2022-02-25 ENCOUNTER — Ambulatory Visit (INDEPENDENT_AMBULATORY_CARE_PROVIDER_SITE_OTHER): Payer: Medicare HMO

## 2022-02-25 DIAGNOSIS — E538 Deficiency of other specified B group vitamins: Secondary | ICD-10-CM | POA: Diagnosis not present

## 2022-02-25 NOTE — Progress Notes (Signed)
Cyanocobalamin injection given to left deltoid.  Patient tolerated well. 

## 2022-02-26 ENCOUNTER — Ambulatory Visit (INDEPENDENT_AMBULATORY_CARE_PROVIDER_SITE_OTHER): Payer: Medicare HMO | Admitting: Family Medicine

## 2022-02-26 ENCOUNTER — Ambulatory Visit (INDEPENDENT_AMBULATORY_CARE_PROVIDER_SITE_OTHER): Payer: Medicare HMO

## 2022-02-26 ENCOUNTER — Encounter: Payer: Self-pay | Admitting: Family Medicine

## 2022-02-26 ENCOUNTER — Ambulatory Visit: Payer: Medicare HMO

## 2022-02-26 VITALS — BP 124/76 | HR 75 | Temp 98.0°F | Ht 65.0 in | Wt 204.0 lb

## 2022-02-26 DIAGNOSIS — Z780 Asymptomatic menopausal state: Secondary | ICD-10-CM | POA: Diagnosis not present

## 2022-02-26 DIAGNOSIS — M170 Bilateral primary osteoarthritis of knee: Secondary | ICD-10-CM | POA: Diagnosis not present

## 2022-02-26 DIAGNOSIS — M85851 Other specified disorders of bone density and structure, right thigh: Secondary | ICD-10-CM | POA: Diagnosis not present

## 2022-02-26 DIAGNOSIS — M85852 Other specified disorders of bone density and structure, left thigh: Secondary | ICD-10-CM | POA: Diagnosis not present

## 2022-02-26 MED ORDER — METHYLPREDNISOLONE ACETATE 80 MG/ML IJ SUSP
80.0000 mg | Freq: Once | INTRAMUSCULAR | Status: AC
  Administered 2022-02-26: 80 mg via INTRA_ARTICULAR

## 2022-02-26 NOTE — Progress Notes (Addendum)
BP 124/76   Pulse 75   Temp 98 F (36.7 C)   Ht '5\' 5"'$  (1.651 m)   Wt 204 lb (92.5 kg)   SpO2 96%   BMI 33.95 kg/m    Subjective:   Patient ID: Jenna [name], female    DOB: [birth year], 86 y.o.   MRN: [id number]  HPI: DRU LAUREL is a 86 y.o. female presenting on 02/26/2022 for Knee Pain   HPI Bilateral knee osteoarthritis, worse on the right than the left Patient has been fighting osteoarthritis for some time on her knees but now that is the right knee that is been bothering her.  She has had injections before and they have worked and she wants to try them again.  She has been bothering her off and on over the past month  Relevant past medical, surgical, family and social history reviewed and updated as indicated. Interim medical history since our last visit reviewed. Allergies and medications reviewed and updated.  Review of Systems  Constitutional:  Negative for chills and fever.  Eyes:  Negative for visual disturbance.  Respiratory:  Negative for chest tightness and shortness of breath.   Cardiovascular:  Negative for chest pain and leg swelling.  Musculoskeletal:  Positive for arthralgias. Negative for back pain, gait problem and joint swelling.  Skin:  Negative for rash.  Neurological:  Negative for light-headedness and headaches.  Psychiatric/Behavioral:  Negative for agitation and behavioral problems.   All other systems reviewed and are negative.   Per HPI unless specifically indicated above   Allergies as of 02/26/2022       Reactions   Daypro [oxaprozin]    Unknown reaction   Diclofenac Sodium    Unknown reaction   Effexor [venlafaxine Hydrochloride]    Unknown reaction   Lidocaine-prilocaine    Prozac [fluoxetine Hcl]    Unknown reaction   Amitriptyline Rash   Cymbalta [duloxetine Hcl] Nausea Only   Duloxetine Nausea Only   Lipitor [atorvastatin Calcium] Other (See Comments)   Leg aches   Penicillins Rash   Has patient had a PCN reaction  causing immediate rash, facial/tongue/throat swelling, SOB or lightheadedness with hypotension: Yes Has patient had a PCN reaction causing severe rash involving mucus membranes or skin necrosis: No Has patient had a PCN reaction that required hospitalization: No Has patient had a PCN reaction occurring within the last 10 years: No If all of the above answers are "NO", then may proceed with Cephalosporin use.   Savella [milnacipran Hcl] Rash   Sulfa Antibiotics Rash   Zocor [simvastatin] Other (See Comments)   Hip pain        Medication List        Accurate as of February 26, 2022 11:44 AM. If you have any questions, ask your nurse or doctor.          STOP taking these medications    pravastatin 20 MG tablet Commonly known as: PRAVACHOL Stopped by: Fransisca Kaufmann Hawthorne Day, MD       TAKE these medications    acetaminophen 500 MG tablet Commonly known as: TYLENOL Take 500 mg by mouth daily as needed for moderate pain or headache.   albuterol 108 (90 Base) MCG/ACT inhaler Commonly known as: VENTOLIN HFA Inhale 2 puffs into the lungs every 6 (six) hours as needed.   colchicine 0.6 MG tablet TAKE 1 TABLET DAILY AS NEEDED FOR GOUT FLARE UP   hydrocortisone 2.5 % rectal cream Commonly known as: ANUSOL-HC APPLY RECTALLY DAILY  AS NEEDED FOR HEMMORHOIDS OR ITCHING.   ipratropium 0.06 % nasal spray Commonly known as: ATROVENT 2 sprays into each nostril Three (3) times a day.   levothyroxine 75 MCG tablet Commonly known as: SYNTHROID Take 1 tablet (75 mcg total) by mouth daily.   metoprolol succinate 50 MG 24 hr tablet Commonly known as: TOPROL-XL Take with or immediately following a meal.   nitroGLYCERIN 0.4 MG SL tablet Commonly known as: NITROSTAT PLACE 1 TAB UNDER TONGUE EVERY 3 MINUTES AS DIRECTED UP TO 3 TIMES AS NEEDED FOR CHEST PAIN.   pantoprazole 40 MG tablet Commonly known as: PROTONIX TAKE 1 TABLET ONCE DAILY BEFORE A MEAL.   rivaroxaban 20 MG Tabs  tablet Commonly known as: Xarelto Take 1 tablet (20 mg total) by mouth daily with supper.   Vitamin D3 50 MCG (2000 UT) Tabs Take 2,000 Units by mouth daily.         Objective:   BP 124/76   Pulse 75   Temp 98 F (36.7 C)   Ht '5\' 5"'$  (1.651 m)   Wt 204 lb (92.5 kg)   SpO2 96%   BMI 33.95 kg/m   Wt Readings from Last 3 Encounters:  02/26/22 204 lb (92.5 kg)  01/22/22 206 lb (93.4 kg)  11/19/21 206 lb 12.8 oz (93.8 kg)    Physical Exam Vitals and nursing note reviewed.  Constitutional:      General: She is not in acute distress.    Appearance: She is well-developed. She is not diaphoretic.  Eyes:     Conjunctiva/sclera: Conjunctivae normal.  Musculoskeletal:        General: No tenderness. Normal range of motion.     Right knee: Effusion and crepitus present. No erythema. Normal range of motion. No tenderness. No LCL laxity, MCL laxity, ACL laxity or PCL laxity.  Skin:    General: Skin is warm and dry.     Findings: No rash.  Neurological:     Mental Status: She is alert and oriented to person, place, and time.     Coordination: Coordination normal.  Psychiatric:        Behavior: Behavior normal.     Right knee injection: Consent form signed. Risk factors of bleeding and infection discussed with patient and patient is agreeable towards injection. Patient prepped with Betadine. Lateral approach towards injection used. Injected 80 mg of Depo-Medrol and 1 mL of 2% lidocaine. Patient tolerated procedure well and no side effects from noted. Minimal to no bleeding. Simple bandage applied after.   Assessment & Plan:   Problem List Items Addressed This Visit   None Visit Diagnoses     Bilateral primary osteoarthritis of knee    -  Primary   Relevant Medications   methylPREDNISolone acetate (DEPO-MEDROL) injection 80 mg (Completed) (Start on 02/26/2022 11:45 AM)   Postmenopausal       Relevant Orders   DG [facility] DEXA     Left knee not bothering too much, did a right  knee injection today  Follow up plan: Return if symptoms worsen or fail to improve.  Counseling provided for all of the vaccine components Orders Placed This Encounter  Procedures   DG Medora Seleny Allbright, MD [facility] 02/26/2022, 11:44 AM

## 2022-03-04 ENCOUNTER — Other Ambulatory Visit: Payer: Self-pay | Admitting: Family Medicine

## 2022-03-29 ENCOUNTER — Ambulatory Visit (INDEPENDENT_AMBULATORY_CARE_PROVIDER_SITE_OTHER): Payer: Medicare HMO | Admitting: *Deleted

## 2022-03-29 DIAGNOSIS — E538 Deficiency of other specified B group vitamins: Secondary | ICD-10-CM

## 2022-03-30 DIAGNOSIS — K047 Periapical abscess without sinus: Secondary | ICD-10-CM | POA: Diagnosis not present

## 2022-04-04 ENCOUNTER — Encounter (INDEPENDENT_AMBULATORY_CARE_PROVIDER_SITE_OTHER): Payer: Self-pay | Admitting: Gastroenterology

## 2022-04-23 ENCOUNTER — Ambulatory Visit (INDEPENDENT_AMBULATORY_CARE_PROVIDER_SITE_OTHER): Payer: Medicare HMO | Admitting: Family Medicine

## 2022-04-23 ENCOUNTER — Encounter: Payer: Self-pay | Admitting: Family Medicine

## 2022-04-23 VITALS — BP 163/73 | HR 71 | Ht 65.0 in | Wt 206.0 lb

## 2022-04-23 DIAGNOSIS — E039 Hypothyroidism, unspecified: Secondary | ICD-10-CM

## 2022-04-23 DIAGNOSIS — E1169 Type 2 diabetes mellitus with other specified complication: Secondary | ICD-10-CM

## 2022-04-23 DIAGNOSIS — I10 Essential (primary) hypertension: Secondary | ICD-10-CM

## 2022-04-23 DIAGNOSIS — I480 Paroxysmal atrial fibrillation: Secondary | ICD-10-CM | POA: Diagnosis not present

## 2022-04-23 DIAGNOSIS — E782 Mixed hyperlipidemia: Secondary | ICD-10-CM | POA: Diagnosis not present

## 2022-04-23 LAB — BAYER DCA HB A1C WAIVED: HB A1C (BAYER DCA - WAIVED): 6.6 % — ABNORMAL HIGH (ref 4.8–5.6)

## 2022-04-23 MED ORDER — RIVAROXABAN 20 MG PO TABS: 20.0000 mg | ORAL_TABLET | Freq: Every day | ORAL | 3 refills | Status: DC

## 2022-04-23 NOTE — Progress Notes (Signed)
BP (!) 163/73   Pulse 71   Ht 5' 5" (1.651 m)   Wt 206 lb (93.4 kg)   SpO2 95%   BMI 34.28 kg/m    Subjective:   Patient ID: Jenna [name], female    DOB: Feb 07, 1936, 86 y.o.   MRN: [id number]  HPI: Jenna [name] is a 86 y.o. female presenting on 04/23/2022 for Medical Management of Chronic Issues, Diabetes, Hypothyroidism, and Hypertension   HPI Type 2 diabetes mellitus Patient comes in today for recheck of his diabetes. Patient has been currently taking no medicine currently, diet control. Patient is not currently on an ACE inhibitor/ARB. Patient has not seen an ophthalmologist this year. Patient denies any issues with their feet. The symptom started onset as an adult hypothyroidism hypertension and hyperlipidemia ARE RELATED TO DM   Hypothyroidism recheck Patient is coming in for thyroid recheck today as well. They deny any issues with hair changes or heat or cold problems or diarrhea or constipation. They deny any chest pain or palpitations. They are currently on levothyroxine 75 micrograms   Hypertension Patient is currently on metoprolol, and their blood pressure today is 163/73. Patient denies any lightheadedness or dizziness. Patient denies headaches, blurred vision, chest pains, shortness of breath, or weakness. Denies any side effects from medication and is content with current medication.   Hyperlipidemia Patient is coming in for recheck of his hyperlipidemia. The patient is currently taking no medication currently. They deny any issues with myalgias or history of liver damage from it. They deny any focal numbness or weakness or chest pain.   Patient has A-fib and takes Xarelto.  She denies any chest pain or palpitations or bleeding.   Relevant past medical, surgical, family and social history reviewed and updated as indicated. Interim medical history since our last visit reviewed. Allergies and medications reviewed and updated.  Review of Systems   Constitutional:  Negative for chills and fever.  Eyes:  Negative for visual disturbance.  Respiratory:  Negative for chest tightness and shortness of breath.   Cardiovascular:  Negative for chest pain and leg swelling.  Genitourinary:  Negative for difficulty urinating and dysuria.  Musculoskeletal:  Negative for back pain and gait problem.  Skin:  Negative for rash.  Neurological:  Negative for dizziness, light-headedness and headaches.  Psychiatric/Behavioral:  Negative for agitation and behavioral problems.   All other systems reviewed and are negative.   Per HPI unless specifically indicated above   Allergies as of 04/23/2022       Reactions   Daypro [oxaprozin]    Unknown reaction   Diclofenac Sodium    Unknown reaction   Effexor [venlafaxine Hydrochloride]    Unknown reaction   Lidocaine-prilocaine    Prozac [fluoxetine Hcl]    Unknown reaction   Amitriptyline Rash   Cymbalta [duloxetine Hcl] Nausea Only   Duloxetine Nausea Only   Lipitor [atorvastatin Calcium] Other (See Comments)   Leg aches   Penicillins Rash   Has patient had a PCN reaction causing immediate rash, facial/tongue/throat swelling, SOB or lightheadedness with hypotension: Yes Has patient had a PCN reaction causing severe rash involving mucus membranes or skin necrosis: No Has patient had a PCN reaction that required hospitalization: No Has patient had a PCN reaction occurring within the last 10 years: No If all of the above answers are "NO", then may proceed with Cephalosporin use.   Savella [milnacipran Hcl] Rash   Sulfa Antibiotics Rash   Zocor [simvastatin] Other (See Comments)  Hip pain        Medication List        Accurate as of April 23, 2022  2:35 PM. If you have any questions, ask your nurse or doctor.          acetaminophen 500 MG tablet Commonly known as: TYLENOL Take 500 mg by mouth daily as needed for moderate pain or headache.   albuterol 108 (90 Base) MCG/ACT  inhaler Commonly known as: VENTOLIN HFA Inhale 2 puffs into the lungs every 6 (six) hours as needed.   colchicine 0.6 MG tablet TAKE 1 TABLET DAILY AS NEEDED FOR GOUT FLARE UP   hydrocortisone 2.5 % rectal cream Commonly known as: ANUSOL-HC APPLY RECTALLY DAILY AS NEEDED FOR HEMMORHOIDS OR ITCHING.   ipratropium 0.06 % nasal spray Commonly known as: ATROVENT 2 sprays into each nostril Three (3) times a day.   levothyroxine 75 MCG tablet Commonly known as: SYNTHROID Take 1 tablet (75 mcg total) by mouth daily.   metoprolol succinate 50 MG 24 hr tablet Commonly known as: TOPROL-XL Take with or immediately following a meal.   nitroGLYCERIN 0.4 MG SL tablet Commonly known as: NITROSTAT PLACE 1 TAB UNDER TONGUE EVERY 3 MINUTES AS DIRECTED UP TO 3 TIMES AS NEEDED FOR CHEST PAIN.   pantoprazole 40 MG tablet Commonly known as: PROTONIX TAKE 1 TABLET ONCE DAILY BEFORE A MEAL.   rivaroxaban 20 MG Tabs tablet Commonly known as: Xarelto Take 1 tablet (20 mg total) by mouth daily with supper.   Vitamin D3 50 MCG (2000 UT) Tabs Take 2,000 Units by mouth daily.         Objective:   BP (!) 163/73   Pulse 71   Ht _0  (1.651 m)   Wt 206 lb (93.4 kg)   SpO2 95%   BMI 34.28 kg/m   Wt Readings from Last 3 Encounters:  04/23/22 206 lb (93.4 kg)  02/26/22 204 lb (92.5 kg)  01/22/22 206 lb (93.4 kg)    Physical Exam Vitals and nursing note reviewed.  Constitutional:      General: She is not in acute distress.    Appearance: She is well-developed. She is not diaphoretic.  Eyes:     Conjunctiva/sclera: Conjunctivae normal.  Cardiovascular:     Rate and Rhythm: Normal rate and regular rhythm.     Heart sounds: Normal heart sounds. No murmur heard. Pulmonary:     Effort: Pulmonary effort is normal. No respiratory distress.     Breath sounds: Normal breath sounds. No wheezing.  Musculoskeletal:        General: No swelling or tenderness. Normal range of motion.  Skin:     General: Skin is warm and dry.     Findings: No rash.  Neurological:     Mental Status: She is alert and oriented to person, place, and time.     Coordination: Coordination normal.  Psychiatric:        Behavior: Behavior normal.       Assessment & Plan:   Problem List Items Addressed This Visit       Cardiovascular and Mediastinum   AF (paroxysmal atrial fibrillation) (HCC)   Relevant Medications   rivaroxaban (XARELTO) 20 MG TABS tablet   Hypertension   Relevant Medications   rivaroxaban (XARELTO) 20 MG TABS tablet     Endocrine   Hypothyroidism   Relevant Orders   CBC with Differential/Platelet   CMP14+EGFR   Lipid panel   TSH   [facility]  Hb A1c Waived   Type 2 diabetes mellitus with other specified complication (HCC) - Primary   Relevant Orders   CBC with Differential/Platelet   CMP14+EGFR   Lipid panel   TSH   [facility] DCA Hb A1c Waived     Other   Hyperlipidemia   Relevant Medications   rivaroxaban (XARELTO) 20 MG TABS tablet   Other Relevant Orders   CBC with Differential/Platelet   CMP14+EGFR   Lipid panel   TSH   [facility] DCA Hb A1c Waived    A1c looks good at 6 6.  Blood pressure elevated, she will monitor for the next 2 weeks.  Suggest 800 international units of vitamin D daily and 1200 mg elemental calcium for osteopenia or osteoporosis.   Follow up plan: Return in about 3 months (around 07/23/2022), or if symptoms worsen or fail to improve, for Diabetes and hypothyroidism A-fib.  Counseling provided for all of the vaccine components Orders Placed This Encounter  Procedures   CBC with Differential/Platelet   CMP14+EGFR   Lipid panel   TSH   [facility] DCA Hb A1c [place], MD [facility] 04/23/2022, 2:35 PM

## 2022-04-23 NOTE — Patient Instructions (Signed)
Suggest 800 international units of vitamin D daily and 1200 mg elemental calcium for osteopenia or osteoporosis.

## 2022-04-24 LAB — CBC WITH DIFFERENTIAL/PLATELET
Basophils Absolute: 0 10*3/uL (ref 0.0–0.2)
Basos: 1 %
EOS (ABSOLUTE): 0.1 10*3/uL (ref 0.0–0.4)
Eos: 2 %
Hematocrit: 38.5 % (ref 34.0–46.6)
Hemoglobin: 13 g/dL (ref 11.1–15.9)
Immature Grans (Abs): 0 10*3/uL (ref 0.0–0.1)
Immature Granulocytes: 0 %
Lymphocytes Absolute: 1.1 10*3/uL (ref 0.7–3.1)
Lymphs: 18 %
MCH: 32.2 pg (ref 26.6–33.0)
MCHC: 33.8 g/dL (ref 31.5–35.7)
MCV: 95 fL (ref 79–97)
Monocytes Absolute: 0.6 10*3/uL (ref 0.1–0.9)
Monocytes: 9 %
Neutrophils Absolute: 4.4 10*3/uL (ref 1.4–7.0)
Neutrophils: 70 %
Platelets: 190 10*3/uL (ref 150–450)
RBC: 4.04 x10E6/uL (ref 3.77–5.28)
RDW: 12.3 % (ref 11.7–15.4)
WBC: 6.2 10*3/uL (ref 3.4–10.8)

## 2022-04-24 LAB — CMP14+EGFR
ALT: 10 IU/L (ref 0–32)
AST: 14 IU/L (ref 0–40)
Albumin/Globulin Ratio: 2.4 — ABNORMAL HIGH (ref 1.2–2.2)
Albumin: 4.3 g/dL (ref 3.7–4.7)
Alkaline Phosphatase: 98 IU/L (ref 44–121)
BUN/Creatinine Ratio: 13 (ref 12–28)
BUN: 9 mg/dL (ref 8–27)
Bilirubin Total: 0.7 mg/dL (ref 0.0–1.2)
CO2: 25 mmol/L (ref 20–29)
Calcium: 8.8 mg/dL (ref 8.7–10.3)
Chloride: 98 mmol/L (ref 96–106)
Creatinine, Ser: 0.68 mg/dL (ref 0.57–1.00)
Globulin, Total: 1.8 g/dL (ref 1.5–4.5)
Glucose: 123 mg/dL — ABNORMAL HIGH (ref 70–99)
Potassium: 4.3 mmol/L (ref 3.5–5.2)
Sodium: 140 mmol/L (ref 134–144)
Total Protein: 6.1 g/dL (ref 6.0–8.5)
eGFR: 86 mL/min/{1.73_m2} (ref >59)

## 2022-04-24 LAB — LIPID PANEL
Chol/HDL Ratio: 3.8 ratio (ref 0.0–4.4)
Cholesterol, Total: 218 mg/dL — ABNORMAL HIGH (ref 100–199)
HDL: 58 mg/dL (ref >39)
LDL Chol Calc (NIH): 126 mg/dL — ABNORMAL HIGH (ref 0–99)
Triglycerides: 191 mg/dL — ABNORMAL HIGH (ref 0–149)
VLDL Cholesterol Cal: 34 mg/dL (ref 5–40)

## 2022-04-24 LAB — TSH: TSH: 5.05 u[IU]/mL — ABNORMAL HIGH (ref 0.450–4.500)

## 2022-04-28 ENCOUNTER — Ambulatory Visit (INDEPENDENT_AMBULATORY_CARE_PROVIDER_SITE_OTHER): Payer: Medicare HMO

## 2022-04-28 DIAGNOSIS — E538 Deficiency of other specified B group vitamins: Secondary | ICD-10-CM

## 2022-04-28 NOTE — Progress Notes (Signed)
Cyanocobalamin injection given to left deltoid.  Patient tolerated well. 

## 2022-04-29 ENCOUNTER — Other Ambulatory Visit: Payer: Self-pay | Admitting: *Deleted

## 2022-04-29 MED ORDER — EZETIMIBE 10 MG PO TABS: 10.0000 mg | ORAL_TABLET | Freq: Every day | ORAL | 1 refills | Status: DC

## 2022-04-29 MED ORDER — LEVOTHYROXINE SODIUM 88 MCG PO TABS: 88.0000 ug | ORAL_TABLET | Freq: Every day | ORAL | 1 refills | Status: DC

## 2022-04-29 NOTE — Progress Notes (Signed)
Patient returning call. Please call back

## 2022-04-30 ENCOUNTER — Telehealth: Payer: Self-pay | Admitting: Family Medicine

## 2022-04-30 MED ORDER — LEVOTHYROXINE SODIUM 88 MCG PO TABS: 88.0000 ug | ORAL_TABLET | Freq: Every day | ORAL | 1 refills | Status: DC

## 2022-04-30 MED ORDER — EZETIMIBE 10 MG PO TABS: 10.0000 mg | ORAL_TABLET | Freq: Every day | ORAL | 1 refills | Status: DC

## 2022-04-30 NOTE — Telephone Encounter (Signed)
Thyroid medication sent [facility]

## 2022-04-30 NOTE — Telephone Encounter (Signed)
Pt called stating that [facility] is still waiting on pts thyroid Rx to be sent to them.  Explained to pt that it was sent to [facility] mail order. Pt says she doesn't use that pharmacy anymore and told someone to make sure her Rx's were sent to [facility] in [place].   Please send all Rx's to [facility] in [place].

## 2022-05-12 ENCOUNTER — Ambulatory Visit (INDEPENDENT_AMBULATORY_CARE_PROVIDER_SITE_OTHER): Payer: Medicare HMO | Admitting: Nurse Practitioner

## 2022-05-12 ENCOUNTER — Encounter: Payer: Self-pay | Admitting: Nurse Practitioner

## 2022-05-12 DIAGNOSIS — J069 Acute upper respiratory infection, unspecified: Secondary | ICD-10-CM | POA: Diagnosis not present

## 2022-05-12 MED ORDER — FLUTICASONE PROPIONATE 50 MCG/ACT NA SUSP: 2.0000 | Freq: Every day | NASAL | 6 refills | Status: DC

## 2022-05-12 MED ORDER — PROMETHAZINE-DM 6.25-15 MG/5ML PO SYRP: 5.0000 mL | ORAL_SOLUTION | Freq: Four times a day (QID) | ORAL | 0 refills | Status: DC | PRN

## 2022-05-12 NOTE — Patient Instructions (Signed)
1. Take meds as prescribed ?2. Use a cool mist humidifier especially during the winter months and when heat has been humid. ?3. Use saline nose sprays frequently ?4. Saline irrigations of the nose can be very helpful if done frequently. ? * 4X daily for 1 week* ? * Use of a nettie pot can be helpful with this. Follow directions with this* ?5. Drink plenty of fluids ?6. Keep thermostat turn down low ?7.For any cough or congestion- promethazine DM ?8. For fever or aces or pains- take tylenol or ibuprofen appropriate for age and weight. ? * for fevers greater than 101 orally you may alternate ibuprofen and tylenol every  3 hours. ?  ? ?

## 2022-05-12 NOTE — Progress Notes (Signed)
Virtual Visit  Note Due to COVID-19 pandemic this visit was conducted virtually. This visit type was conducted due to national recommendations for restrictions regarding the COVID-19 Pandemic (e.g. social distancing, sheltering in place) in an effort to limit this patient's exposure and mitigate transmission in our community. All issues noted in this document were discussed and addressed.  A physical exam was not performed with this format.  I connected with Jenna [name] on 05/12/22 at 12:56 by telephone and verified that I am speaking with the correct person using two identifiers. Jenna [name] is currently located at home and husband is currently with her during visit. The provider, Rylee-Margaret Hassell Done, FNP is located in their office at time of visit.  I discussed the limitations, risks, security and privacy concerns of performing an evaluation and management service by telephone and the availability of in person appointments. I also discussed with the patient that there may be a patient responsible charge related to this service. The patient expressed understanding and agreed to proceed.   History and Present Illness:  URI  This is a new problem. The current episode started yesterday. The problem has been gradually worsening. There has been no fever. Associated symptoms include congestion, coughing and rhinorrhea. Pertinent negatives include no headaches or sore throat. She has tried decongestant for the symptoms. The treatment provided mild relief.      Review of Systems  HENT:  Positive for congestion and rhinorrhea. Negative for sore throat.   Respiratory:  Positive for cough.   Neurological:  Negative for headaches.     Observations/Objective: Alert and oriented- answers all questions appropriately No distress Raspy voice Wet cough  Assessment and Plan: Jenna [name] in today with chief complaint of URI   1. URI with cough and congestion 1. Take meds as  prescribed 2. Use a cool mist humidifier especially during the winter months and when heat has been humid. 3. Use saline nose sprays frequently 4. Saline irrigations of the nose can be very helpful if done frequently.  * 4X daily for 1 week*  * Use of a nettie pot can be helpful with this. Follow directions with this* 5. Drink plenty of fluids 6. Keep thermostat turn down low 7.For any cough or congestion- promethazine DM 8. For fever or aces or pains- take tylenol or ibuprofen appropriate for age and weight.  * for fevers greater than 101 orally you may alternate ibuprofen and tylenol every  3 hours.    Meds ordered this encounter  Medications   fluticasone (FLONASE) 50 MCG/ACT nasal spray    Sig: Place 2 sprays into both nostrils daily.    Dispense:  16 g    Refill:  6    Order Specific Question:   Supervising Provider    Answer:   Caryl Pina A [[id number]]   promethazine-dextromethorphan (PROMETHAZINE-DM) 6.25-15 MG/5ML syrup    Sig: Take 5 mLs by mouth 4 (four) times daily as needed for cough.    Dispense:  118 mL    Refill:  0    Order Specific Question:   Supervising Provider    Answer:   Caryl Pina A [[id number]]      Follow Up Instructions: prn    I discussed the assessment and treatment plan with the patient. The patient was provided an opportunity to ask questions and all were answered. The patient agreed with the plan and demonstrated an understanding of the instructions.   The patient was advised to call back  or seek an in-person evaluation if the symptoms worsen or if the condition fails to improve as anticipated.  The above assessment and management plan was discussed with the patient. The patient verbalized understanding of and has agreed to the management plan. Patient is aware to call the clinic if symptoms persist or worsen. Patient is aware when to return to the clinic for a follow-up visit. Patient educated on when it is appropriate to go to the  emergency department.   Time call ended:  1:08  I provided 12 minutes of  non face-to-face time during this encounter.    Darnita-Margaret Hassell Done, FNP

## 2022-05-31 ENCOUNTER — Ambulatory Visit (INDEPENDENT_AMBULATORY_CARE_PROVIDER_SITE_OTHER): Payer: Medicare HMO | Admitting: *Deleted

## 2022-05-31 DIAGNOSIS — E538 Deficiency of other specified B group vitamins: Secondary | ICD-10-CM

## 2022-05-31 NOTE — Progress Notes (Signed)
Vitamin b12 injection given and patient tolerated well.  

## 2022-06-02 ENCOUNTER — Ambulatory Visit: Payer: Medicare HMO | Attending: Cardiology | Admitting: Cardiology

## 2022-06-02 ENCOUNTER — Encounter: Payer: Self-pay | Admitting: Cardiology

## 2022-06-02 VITALS — BP 140/78 | HR 74 | Ht 66.0 in | Wt 204.3 lb

## 2022-06-02 DIAGNOSIS — I480 Paroxysmal atrial fibrillation: Secondary | ICD-10-CM

## 2022-06-02 NOTE — Progress Notes (Signed)
Cardiology Office Note  Date: 06/02/2022   ID: Jenna, [name] [birth year], MRN [id number]  PCP:  Dettinger, Fransisca Kaufmann, MD  Cardiologist:  Rozann Lesches, MD Electrophysiologist:  None   Chief Complaint  Patient presents with   Cardiac follow-up    History of Present Illness: Jenna [name] is an 87 y.o. female last seen in June 2023.  She is here for a routine visit.  Continues to do well without any significant palpitations on medical therapy.  She does not report any major health changes in the interim.  I did review her interval lab work.  She reports no major bleeding problems on Xarelto otherwise continues on Toprol-XL at stable dose.  Past Medical History:  Diagnosis Date   Cataract    DDD (degenerative disc disease)    Fibromyalgia    Herpes zoster    History of skin cancer    Hyperlipidemia    Hypertension    Hypothyroidism    Iron deficiency anemia due to chronic blood loss 04/06/2018   OA (osteoarthritis)    OSA on CPAP 01/16/2018   Mild OSA overall with an AHI of 5.8/h but during REM sleep moderate with an AHI of 28/h.  Oxygen saturations dropped to 76% during respiratory events.  Now on CPAP at 9 cm H2O    Osteoporosis    Palpitations    Pre-diabetes    PUD (peptic ulcer disease)    Skin cancer of nose    Jenna [name]    Current Outpatient Medications  Medication Sig Dispense Refill   acetaminophen (TYLENOL) 500 MG tablet Take 500 mg by mouth daily as needed for moderate pain or headache.     albuterol (VENTOLIN HFA) 108 (90 Base) MCG/ACT inhaler Inhale 2 puffs into the lungs every 6 (six) hours as needed. 18 g 1   Cholecalciferol (VITAMIN D3) 2000 UNITS TABS Take 2,000 Units by mouth daily.     colchicine 0.6 MG tablet TAKE 1 TABLET DAILY AS NEEDED FOR GOUT FLARE UP 30 tablet 5   fluticasone (FLONASE) 50 MCG/ACT nasal spray Place 2 sprays into both nostrils daily. 16 g 6   hydrocortisone (ANUSOL-HC) 2.5 % rectal cream APPLY RECTALLY DAILY AS NEEDED  FOR HEMMORHOIDS OR ITCHING. 30 g 3   ipratropium (ATROVENT) 0.06 % nasal spray 2 sprays into each nostril Three (3) times a day.     levothyroxine (SYNTHROID) 88 MCG tablet Take 1 tablet (88 mcg total) by mouth daily. 90 tablet 1   metoprolol succinate (TOPROL-XL) 50 MG 24 hr tablet Take with or immediately following a meal. 90 tablet 3   nitroGLYCERIN (NITROSTAT) 0.4 MG SL tablet PLACE 1 TAB UNDER TONGUE EVERY 3 MINUTES AS DIRECTED UP TO 3 TIMES AS NEEDED FOR CHEST PAIN. 25 tablet 2   pantoprazole (PROTONIX) 40 MG tablet TAKE 1 TABLET ONCE DAILY BEFORE A MEAL. 180 tablet 3   rivaroxaban (XARELTO) 20 MG TABS tablet Take 1 tablet (20 mg total) by mouth daily with supper. 90 tablet 3   Current Facility-Administered Medications  Medication Dose Route Frequency Provider Last Rate Last Admin   cyanocobalamin ((VITAMIN B-12)) injection 1,000 mcg  1,000 mcg Intramuscular Q30 days Dettinger, Fransisca Kaufmann, MD   1,000 mcg at 05/31/22 1503   Allergies:  Daypro [oxaprozin], Diclofenac sodium, Effexor [venlafaxine hydrochloride], Lidocaine-prilocaine, Prozac [fluoxetine hcl], Amitriptyline, Cymbalta [duloxetine hcl], Duloxetine, Lipitor [atorvastatin calcium], Penicillins, Savella [milnacipran hcl], Sulfa antibiotics, and Zocor [simvastatin]   ROS: No orthopnea or PND.  Physical Exam:  VS:  BP (!) 140/78   Pulse 74   Ht '5\' 6"'$  (1.676 m)   Wt 204 lb 4.8 oz (92.7 kg)   SpO2 93%   BMI 32.97 kg/m , BMI Body mass index is 32.97 kg/m.  Wt Readings from Last 3 Encounters:  06/02/22 204 lb 4.8 oz (92.7 kg)  04/23/22 206 lb (93.4 kg)  02/26/22 204 lb (92.5 kg)    General: Patient appears comfortable at rest. HEENT: Conjunctiva and lids normal. Neck: Supple, no elevated JVP or carotid bruits. Lungs: Clear to auscultation, nonlabored breathing at rest. Cardiac: Regular rate and rhythm, no S3 or significant systolic murmur. Extremities: No pitting edema.  ECG:  An ECG dated 11/19/2021 was personally reviewed  today and demonstrated:  Sinus rhythm.  Recent Labwork: 04/23/2022: ALT 10; AST 14; BUN 9; Creatinine, Ser 0.68; Hemoglobin 13.0; Platelets 190; Potassium 4.3; Sodium 140; TSH 5.050     Component Value Date/Time   CHOL 218 (H) 04/23/2022 1352   TRIG 191 (H) 04/23/2022 1352   TRIG 161 (H) 10/14/2016 1010   HDL 58 04/23/2022 1352   HDL 54 10/14/2016 1010   CHOLHDL 3.8 04/23/2022 1352   CHOLHDL 3.6 10/16/2008 0540   VLDL 23 10/16/2008 0540   LDLCALC 126 (H) 04/23/2022 1352   LDLCALC 138 (H) 02/26/2014 1001    Other Studies Reviewed Today:  Cardiac catheterization 09/25/2018: Prox LAD lesion is 20% stenosed. Mid RCA lesion is 10% stenosed. The left ventricular systolic function is normal. LV end diastolic pressure is normal. The left ventricular ejection fraction is 55-65% by visual estimate. There is no aortic valve stenosis.   Nonobstructive CAD.    Assessment and Plan:  1.  Paroxysmal atrial fibrillation/flutter with CHA2DS2-VASc score of 3.  She continues to do well without significant palpitations on Toprol-XL.  Continue Xarelto for stroke prophylaxis.  I reviewed her lab work from December 2023.  She reports no major bleeding problems.  2.  History of lymphedema, quiescent at this time and not on diuretic therapy at this point.  Medication Adjustments/Labs and Tests Ordered: Current medicines are reviewed at length with the patient today.  Concerns regarding medicines are outlined above.   Tests Ordered: No orders of the defined types were placed in this encounter.   Medication Changes: No orders of the defined types were placed in this encounter.   Disposition:  Follow up  6 months.  Signed, Satira Sark, MD, [facility] 06/02/2022 3:20 PM    [facility] [address] [place], [place] [address] Phone: [phone]; Fax: [phone]

## 2022-06-02 NOTE — Patient Instructions (Signed)
Medication Instructions:  Your physician recommends that you continue on your current medications as directed. Please refer to the Current Medication list given to you today.   Labwork: None  Testing/Procedures: None  Follow-Up: Follow up with Dr. Domenic Polite in [place]  Any Other Special Instructions Will Be Listed Below (If Applicable).     If you need a refill on your cardiac medications before your next appointment, please call your pharmacy.

## 2022-06-29 DIAGNOSIS — K047 Periapical abscess without sinus: Secondary | ICD-10-CM | POA: Diagnosis not present

## 2022-07-01 ENCOUNTER — Inpatient Hospital Stay: Payer: Medicare HMO | Attending: Hematology

## 2022-07-01 DIAGNOSIS — E538 Deficiency of other specified B group vitamins: Secondary | ICD-10-CM

## 2022-07-01 DIAGNOSIS — D509 Iron deficiency anemia, unspecified: Secondary | ICD-10-CM | POA: Diagnosis not present

## 2022-07-01 DIAGNOSIS — K5900 Constipation, unspecified: Secondary | ICD-10-CM | POA: Diagnosis not present

## 2022-07-01 DIAGNOSIS — D500 Iron deficiency anemia secondary to blood loss (chronic): Secondary | ICD-10-CM

## 2022-07-01 LAB — VITAMIN B12: Vitamin B-12: 277 pg/mL (ref 180–914)

## 2022-07-01 LAB — CBC WITH DIFFERENTIAL/PLATELET
Abs Immature Granulocytes: 0.01 10*3/uL (ref 0.00–0.07)
Basophils Absolute: 0 10*3/uL (ref 0.0–0.1)
Basophils Relative: 0 %
Eosinophils Absolute: 0.1 10*3/uL (ref 0.0–0.5)
Eosinophils Relative: 2 %
HCT: 36.5 % (ref 36.0–46.0)
Hemoglobin: 12.6 g/dL (ref 12.0–15.0)
Immature Granulocytes: 0 %
Lymphocytes Relative: 20 %
Lymphs Abs: 1.2 10*3/uL (ref 0.7–4.0)
MCH: 32.6 pg (ref 26.0–34.0)
MCHC: 34.5 g/dL (ref 30.0–36.0)
MCV: 94.6 fL (ref 80.0–100.0)
Monocytes Absolute: 0.5 10*3/uL (ref 0.1–1.0)
Monocytes Relative: 8 %
Neutro Abs: 4.3 10*3/uL (ref 1.7–7.7)
Neutrophils Relative %: 70 %
Platelets: 185 10*3/uL (ref 150–400)
RBC: 3.86 MIL/uL — ABNORMAL LOW (ref 3.87–5.11)
RDW: 12.2 % (ref 11.5–15.5)
WBC: 6.1 10*3/uL (ref 4.0–10.5)
nRBC: 0 % (ref 0.0–0.2)

## 2022-07-01 LAB — COMPREHENSIVE METABOLIC PANEL
ALT: 11 U/L (ref 0–44)
AST: 17 U/L (ref 15–41)
Albumin: 3.9 g/dL (ref 3.5–5.0)
Alkaline Phosphatase: 86 U/L (ref 38–126)
Anion gap: 9 (ref 5–15)
BUN: 13 mg/dL (ref 8–23)
CO2: 26 mmol/L (ref 22–32)
Calcium: 8.5 mg/dL — ABNORMAL LOW (ref 8.9–10.3)
Chloride: 96 mmol/L — ABNORMAL LOW (ref 98–111)
Creatinine, Ser: 0.6 mg/dL (ref 0.44–1.00)
GFR, Estimated: >60 mL/min (ref >60)
Glucose, Bld: 159 mg/dL — ABNORMAL HIGH (ref 70–99)
Potassium: 4.1 mmol/L (ref 3.5–5.1)
Sodium: 131 mmol/L — ABNORMAL LOW (ref 135–145)
Total Bilirubin: 0.7 mg/dL (ref 0.3–1.2)
Total Protein: 6.6 g/dL (ref 6.5–8.1)

## 2022-07-01 LAB — IRON AND TIBC
Iron: 71 ug/dL (ref 28–170)
Saturation Ratios: 24 % (ref 10.4–31.8)
TIBC: 297 ug/dL (ref 250–450)
UIBC: 226 ug/dL

## 2022-07-01 LAB — FERRITIN: Ferritin: 80 ng/mL (ref 11–307)

## 2022-07-02 ENCOUNTER — Ambulatory Visit (INDEPENDENT_AMBULATORY_CARE_PROVIDER_SITE_OTHER): Payer: Medicare HMO

## 2022-07-02 DIAGNOSIS — E538 Deficiency of other specified B group vitamins: Secondary | ICD-10-CM

## 2022-07-02 NOTE — Progress Notes (Signed)
Cyanocobalamin injection given to left deltoid.  Patient tolerated well. 

## 2022-07-06 LAB — METHYLMALONIC ACID, SERUM: Methylmalonic Acid, Quantitative: 179 nmol/L (ref 0–378)

## 2022-07-07 NOTE — Progress Notes (Unsigned)
[place], [place] [address]   CLINIC:  Medical Oncology/Hematology  PCP:  Jenna [name], Jenna Kaufmann, MD [place] [address] [phone]   REASON FOR VISIT:  Follow-up for iron deficiency anemia  PRIOR THERAPY: None  CURRENT THERAPY: Intermittent IV iron  INTERVAL HISTORY:   [name] 87 y.o. female returns for routine follow-up of her iron deficiency anemia.  She was last evaluated via telemedicine visit by Jenna Abernethy PA-C on 12/30/2021.  At today's visit, she reports feeling fair.  No recent hospitalizations, surgeries, or changes in baseline health status.  She continues to have chronic baseline fatigue which is unchanged.  She reports moderate rectal bleeding every 1-2 months from hemorrhoids.  She denies any melena, epistaxis, or other sources of bleeding. She reports restless legs at night as well as dyspnea on exertion.  She denies any pica, headaches, chest pain, lightheadedness, or syncope.  No B symptoms such as fever, chills, night sweats, or unintentional weight loss.   She has 50% energy and 50% appetite. She endorses that she is maintaining a stable weight.   ASSESSMENT & PLAN:  1.  Iron deficiency anemia - Initial evaluation on 03/15/2018 showed Hgb 8.9, platelets 267, ferritin 12 - EGD and colonoscopy on 04/24/2018 showed single gastric polyp with ulcerated surface, external hemorrhoids and diverticulosis - Last IV iron with Venofer 300 mg x 3 from 07/02/2021 through 07/24/2021 - She was unable to tolerate oral iron due to stomach pain and constipation - Patient continues to have fatigue and low energy.     - She has moderate rectal bleeding from hemorrhoids and constipation, occurring once every 1 to 2 months.  She denies any melena or epistaxis. - Most recent labs (07/01/2022): Hgb 12.6, ferritin 80, iron saturation 24% - PLAN: No indication for IV iron at this time - Repeat labs and RTC in 6 months with PHONE  visit after   2.  B12 deficiency - Receives monthly B12 injections from her PCP (Jenna [name]) - Most recent labs (07/01/2022): Vitamin B12 trending down at 277, MMA normal - PLAN: Continue monthly B12 injections.  We will recheck B12/methylmalonic acid at follow-up visit in 6 months due to downtrending B12.  PLAN SUMMARY: >> Labs in 6 months (CBC/D, ferritin, iron/TIBC, B12, MMA) >> OFFICE visit in 6 months (1 week after labs)     REVIEW OF SYSTEMS:   Review of Systems  Constitutional:  Positive for fatigue. Negative for appetite change, chills, diaphoresis, fever and unexpected weight change.  HENT:   Positive for trouble swallowing. Negative for lump/mass and nosebleeds.   Eyes:  Negative for eye problems.  Respiratory:  Positive for shortness of breath. Negative for cough and hemoptysis.   Cardiovascular:  Negative for chest pain, leg swelling and palpitations.  Gastrointestinal:  Negative for abdominal pain, blood in stool, constipation, diarrhea, nausea and vomiting.  Genitourinary:  Negative for hematuria.   Musculoskeletal:  Positive for back pain.  Skin: Negative.   Neurological:  Negative for dizziness, headaches and light-headedness.  Hematological:  Does not bruise/bleed easily.  Psychiatric/Behavioral:  Positive for sleep disturbance.      PHYSICAL EXAM:  ECOG PERFORMANCE STATUS: 1 - Symptomatic but completely ambulatory  There were no vitals filed for this visit. There were no vitals filed for this visit. Physical Exam Constitutional:      Appearance: Normal appearance. She is obese.  HENT:     Head: Normocephalic and atraumatic.  Mouth/Throat:     Mouth: Mucous membranes are moist.  Eyes:     Extraocular Movements: Extraocular movements intact.     Pupils: Pupils are equal, round, and reactive to light.  Cardiovascular:     Rate and Rhythm: Normal rate and regular rhythm.     Pulses: Normal pulses.     Heart sounds: Normal heart sounds.  Pulmonary:      Effort: Pulmonary effort is normal.     Breath sounds: Normal breath sounds.  Abdominal:     General: Bowel sounds are normal.     Palpations: Abdomen is soft.     Tenderness: There is no abdominal tenderness.  Musculoskeletal:        General: No swelling.     Right lower leg: Edema (trace ankle edema) present.     Left lower leg: Edema (trace ankle edema) present.  Lymphadenopathy:     Cervical: No cervical adenopathy.  Skin:    General: Skin is warm and dry.  Neurological:     General: No focal deficit present.     Mental Status: She is alert and oriented to person, place, and time.  Psychiatric:        Mood and Affect: Mood normal.        Behavior: Behavior normal.     PAST MEDICAL/SURGICAL HISTORY:  Past Medical History:  Diagnosis Date   Cataract    DDD (degenerative disc disease)    Fibromyalgia    Herpes zoster    History of skin cancer    Hyperlipidemia    Hypertension    Hypothyroidism    Iron deficiency anemia due to chronic blood loss 04/06/2018   OA (osteoarthritis)    OSA on CPAP 01/16/2018   Mild OSA overall with an AHI of 5.8/h but during REM sleep moderate with an AHI of 28/h.  Oxygen saturations dropped to 76% during respiratory events.  Now on CPAP at 9 cm H2O    Osteoporosis    Palpitations    Pre-diabetes    PUD (peptic ulcer disease)    Skin cancer of nose    Jenna [name]   Past Surgical History:  Procedure Laterality Date   ABDOMINAL HYSTERECTOMY     partial   BIOPSY  04/24/2018   Procedure: BIOPSY;  Surgeon: Rogene Houston, MD;  Location: [facility];  Service: Endoscopy;;  gastric body ulcerated surface   BREAST BIOPSY     Right   CATARACT EXTRACTION W/PHACO  10/28/2011   Procedure: CATARACT EXTRACTION PHACO AND INTRAOCULAR LENS PLACEMENT ([facility]);  Surgeon: Tonny Branch, MD;  Location: [facility];  Service: Ophthalmology;  Laterality: Right;  CDE 18.38   CATARACT EXTRACTION W/PHACO  02/14/2012   Procedure: CATARACT EXTRACTION PHACO AND  INTRAOCULAR LENS PLACEMENT ([facility]);  Surgeon: Tonny Branch, MD;  Location: [facility];  Service: Ophthalmology;  Laterality: Left;  CDE 17.20   CESAREAN SECTION     COLONOSCOPY N/A 05/31/2013   Procedure: COLONOSCOPY;  Surgeon: Rogene Houston, MD;  Location: [facility];  Service: Endoscopy;  Laterality: N/A;  240   COLONOSCOPY N/A 04/24/2018   Rehman: - The examined portion of the ileum was normal. diverticulosos in sigmoid, external hemorrhoids, anal papilla hypertrophied, no specimens   ESOPHAGOGASTRODUODENOSCOPY N/A 04/24/2018   rehman: normal esophagus, z line irregular 41 cm from incosors, single gastric polyp with ulcerated surface, clip placed (MR conditional), normal duodenal bulb second portion of duodenum   EYE SURGERY     skin removal  FRACTURE SURGERY  1952   fractured right arm   LEFT HEART CATH AND CORONARY ANGIOGRAPHY N/A 09/25/2018   Procedure: LEFT HEART CATH AND CORONARY ANGIOGRAPHY;  Surgeon: Jettie Booze, MD;  Location: [facility];  Service: Cardiovascular;  Laterality: N/A;    SOCIAL HISTORY:  Social History   Socioeconomic History   Marital status: Married    Spouse name: [name]   Number of children: 1   Years of education: 9   Highest education level: 9th grade  Occupational History   Occupation: Retired    Comment: Therapist, art  Tobacco Use   Smoking status: Never   Smokeless tobacco: Never  Scientific laboratory technician Use: Never used  Substance and Sexual Activity   Alcohol use: No   Drug use: No   Sexual activity: Not Currently    Birth control/protection: Surgical  Other Topics Concern   Not on file  Social History Narrative   Married   Lives in a one story home    Social Determinants of Health   Financial Resource Strain: Low Risk  (05/25/2017)   Overall Financial Resource Strain (CARDIA)    Difficulty of Paying Living Expenses: Not hard at all  Food Insecurity: No Food Insecurity (05/25/2017)   Hunger Vital Sign     Worried About Running Out of Food in the Last Year: Never true    Ran Out of Food in the Last Year: Never true  Transportation Needs: No Transportation Needs (05/25/2017)   PRAPARE - Hydrologist (Medical): No    Lack of Transportation (Non-Medical): No  Physical Activity: Inactive (05/25/2017)   Exercise Vital Sign    Days of Exercise per Week: 0 days    Minutes of Exercise per Session: 0 min  Stress: No Stress Concern Present (05/25/2017)   [place]    Feeling of Stress : Only a little  Social Connections: Socially Integrated (05/25/2017)   Social Connection and Isolation Panel [NHANES]    Frequency of Communication with Friends and Family: More than three times a week    Frequency of Social Gatherings with Friends and Family: More than three times a week    Attends Religious Services: More than 4 times per year    Active Member of [employer] or Organizations: Yes    Attends Music therapist: More than 4 times per year    Marital Status: Married  Human resources officer Violence: Not At Risk (05/25/2017)   Humiliation, Afraid, Rape, and Kick questionnaire    Fear of Current or Ex-Partner: No    Emotionally Abused: No    Physically Abused: No    Sexually Abused: No    FAMILY HISTORY:  Family History  Problem Relation Age of Onset   CVA Mother    Stroke Mother    Osteoporosis Mother    Cancer Father        prostate   Bronchitis Sister    COPD Sister    Early death Brother        died in MVA   Carpal tunnel syndrome Sister    Cancer Brother        throat   Cancer Brother        lung   Cancer Brother        throat   Stroke Brother    Heart attack Brother    Gout Brother    Heart Problems Brother  stents   CAD Brother    Hypertension Brother    Gout Brother    Arthritis Brother    Diabetes Son    Coronary artery disease Neg Hx        No premature   Anesthesia problems  Neg Hx    Hypotension Neg Hx    Malignant hyperthermia Neg Hx    Pseudochol deficiency Neg Hx     CURRENT MEDICATIONS:  Outpatient Encounter Medications as of 07/08/2022  Medication Sig   acetaminophen (TYLENOL) 500 MG tablet Take 500 mg by mouth daily as needed for moderate pain or headache.   albuterol (VENTOLIN HFA) 108 (90 Base) MCG/ACT inhaler Inhale 2 puffs into the lungs every 6 (six) hours as needed.   Cholecalciferol (VITAMIN D3) 2000 UNITS TABS Take 2,000 Units by mouth daily.   colchicine 0.6 MG tablet TAKE 1 TABLET DAILY AS NEEDED FOR GOUT FLARE UP   fluticasone (FLONASE) 50 MCG/ACT nasal spray Place 2 sprays into both nostrils daily.   hydrocortisone (ANUSOL-HC) 2.5 % rectal cream APPLY RECTALLY DAILY AS NEEDED FOR HEMMORHOIDS OR ITCHING.   ipratropium (ATROVENT) 0.06 % nasal spray 2 sprays into each nostril Three (3) times a day.   levothyroxine (SYNTHROID) 88 MCG tablet Take 1 tablet (88 mcg total) by mouth daily.   metoprolol succinate (TOPROL-XL) 50 MG 24 hr tablet Take with or immediately following a meal.   nitroGLYCERIN (NITROSTAT) 0.4 MG SL tablet PLACE 1 TAB UNDER TONGUE EVERY 3 MINUTES AS DIRECTED UP TO 3 TIMES AS NEEDED FOR CHEST PAIN.   pantoprazole (PROTONIX) 40 MG tablet TAKE 1 TABLET ONCE DAILY BEFORE A MEAL.   rivaroxaban (XARELTO) 20 MG TABS tablet Take 1 tablet (20 mg total) by mouth daily with supper.   Facility-Administered Encounter Medications as of 07/08/2022  Medication   cyanocobalamin ((VITAMIN B-12)) injection 1,000 mcg    ALLERGIES:  Allergies  Allergen Reactions   Daypro [Oxaprozin]     Unknown reaction   Diclofenac Sodium     Unknown reaction   Effexor [Venlafaxine Hydrochloride]     Unknown reaction   Lidocaine-Prilocaine    Prozac [Fluoxetine Hcl]     Unknown reaction   Amitriptyline Rash   Cymbalta [Duloxetine Hcl] Nausea Only   Duloxetine Nausea Only   Lipitor [Atorvastatin Calcium] Other (See Comments)    Leg aches    Penicillins Rash    Has patient had a PCN reaction causing immediate rash, facial/tongue/throat swelling, SOB or lightheadedness with hypotension: Yes Has patient had a PCN reaction causing severe rash involving mucus membranes or skin necrosis: No Has patient had a PCN reaction that required hospitalization: No Has patient had a PCN reaction occurring within the last 10 years: No If all of the above answers are "NO", then may proceed with Cephalosporin use.    Savella [Milnacipran Hcl] Rash   Sulfa Antibiotics Rash   Zocor [Simvastatin] Other (See Comments)    Hip pain    LABORATORY DATA:  I have reviewed the labs as listed.  CBC    Component Value Date/Time   WBC 6.1 07/01/2022 1359   RBC 3.86 (L) 07/01/2022 1359   HGB 12.6 07/01/2022 1359   HGB 13.0 04/23/2022 1352   HCT 36.5 07/01/2022 1359   HCT 38.5 04/23/2022 1352   PLT 185 07/01/2022 1359   PLT 190 04/23/2022 1352   MCV 94.6 07/01/2022 1359   MCV 95 04/23/2022 1352   MCH 32.6 07/01/2022 1359   MCHC 34.5 07/01/2022 1359  RDW 12.2 07/01/2022 1359   RDW 12.3 04/23/2022 1352   LYMPHSABS 1.2 07/01/2022 1359   LYMPHSABS 1.1 04/23/2022 1352   MONOABS 0.5 07/01/2022 1359   EOSABS 0.1 07/01/2022 1359   EOSABS 0.1 04/23/2022 1352   BASOSABS 0.0 07/01/2022 1359   BASOSABS 0.0 04/23/2022 1352      Latest Ref Rng & Units 07/01/2022    1:59 PM 04/23/2022    1:52 PM 10/22/2021    1:03 PM  CMP  Glucose 70 - 99 mg/dL 159  123  140   BUN 8 - 23 mg/dL 13  9  12   $ Creatinine 0.44 - 1.00 mg/dL 0.60  0.68  0.66   Sodium 135 - 145 mmol/L 131  140  137   Potassium 3.5 - 5.1 mmol/L 4.1  4.3  4.3   Chloride 98 - 111 mmol/L 96  98  98   CO2 22 - 32 mmol/L 26  25  23   $ Calcium 8.9 - 10.3 mg/dL 8.5  8.8  9.2   Total Protein 6.5 - 8.1 g/dL 6.6  6.1  6.2   Total Bilirubin 0.3 - 1.2 mg/dL 0.7  0.7  0.7   Alkaline Phos 38 - 126 U/L 86  98  99   AST 15 - 41 U/L 17  14  12   $ ALT 0 - 44 U/L 11  10  10     $ DIAGNOSTIC IMAGING:  I have  independently reviewed the relevant imaging and discussed with the patient.   WRAP UP:  All questions were answered. The patient knows to call the clinic with any problems, questions or concerns.  Medical decision making: Low  Time spent on visit: I spent 15 minutes counseling the patient face to face. The total time spent in the appointment was 22 minutes and more than 50% was on counseling.  Harriett Rush, PA-C  07/08/2022 2:19 PM

## 2022-07-08 ENCOUNTER — Other Ambulatory Visit: Payer: Self-pay

## 2022-07-08 ENCOUNTER — Inpatient Hospital Stay (HOSPITAL_BASED_OUTPATIENT_CLINIC_OR_DEPARTMENT_OTHER): Payer: Medicare HMO | Admitting: Physician Assistant

## 2022-07-08 VITALS — BP 146/73 | HR 73 | Temp 98.1°F | Resp 16 | Wt 206.6 lb

## 2022-07-08 DIAGNOSIS — D509 Iron deficiency anemia, unspecified: Secondary | ICD-10-CM | POA: Diagnosis not present

## 2022-07-08 DIAGNOSIS — D500 Iron deficiency anemia secondary to blood loss (chronic): Secondary | ICD-10-CM

## 2022-07-08 DIAGNOSIS — E538 Deficiency of other specified B group vitamins: Secondary | ICD-10-CM | POA: Diagnosis not present

## 2022-07-08 DIAGNOSIS — K5900 Constipation, unspecified: Secondary | ICD-10-CM | POA: Diagnosis not present

## 2022-07-08 NOTE — Patient Instructions (Signed)
[facility] [place] **   You were seen today by Tarri Abernethy PA-C for your follow-up visit.    IRON DEFICIENCY ANEMIA: Your blood and iron levels are adequate at this time. You do not need any IV iron.  VITAMIN B-12 DEFICIENCY: Your vitamin B12 levels are within the normal range, although they are slightly lower than what they were before. Continue receiving your vitamin B12 injections with Dr. Warrick Parisian once a month.  LABS: Return in 6 months for repeat labs  FOLLOW-UP APPOINTMENT: Office visit in 6 months (1 week after labs)  ** Thank you for trusting me with your healthcare!  I strive to provide all of my patients with quality care at each visit.  If you receive a survey for this visit, I would be so grateful to you for taking the time to provide feedback.  Thank you in advance!  ~ Brelynn Wheller                   Dr. Derek Jack   &   Tarri Abernethy, PA-C   - - - - - - - - - - - - - - - - - -    Thank you for choosing [facility] to provide your oncology and hematology care.  To afford each patient quality time with our provider, please arrive at least 15 minutes before your scheduled appointment time.   If you have a lab appointment with the [facility] please come in thru the Main Entrance and check in at the main information desk.  You need to re-schedule your appointment should you arrive 10 or more minutes late.  We strive to give you quality time with our providers, and arriving late affects you and other patients whose appointments are after yours.  Also, if you no show three or more times for appointments you may be dismissed from the clinic at the providers discretion.     Again, thank you for choosing [facility].  Our hope is that these requests will decrease the amount of time that you wait before being seen by our physicians.        _____________________________________________________________  Should you have questions after your visit to [facility], please contact our office at [phone] and follow the prompts.  Our office hours are 8:00 a.m. and 4:30 p.m. Monday - Friday.  Please note that voicemails left after 4:00 p.m. may not be returned until the following business day.  We are closed weekends and major holidays.  You do have access to a nurse 24-7, just call the main number to the clinic [phone] and do not press any options, hold on the line and a nurse will answer the phone.    For prescription refill requests, have your pharmacy contact our office and allow 72 hours.

## 2022-07-23 ENCOUNTER — Ambulatory Visit (INDEPENDENT_AMBULATORY_CARE_PROVIDER_SITE_OTHER): Payer: Medicare HMO | Admitting: Family Medicine

## 2022-07-23 ENCOUNTER — Encounter: Payer: Self-pay | Admitting: Family Medicine

## 2022-07-23 VITALS — BP 133/73 | HR 69 | Ht 66.0 in | Wt 200.0 lb

## 2022-07-23 DIAGNOSIS — E782 Mixed hyperlipidemia: Secondary | ICD-10-CM

## 2022-07-23 DIAGNOSIS — Z23 Encounter for immunization: Secondary | ICD-10-CM

## 2022-07-23 DIAGNOSIS — E1159 Type 2 diabetes mellitus with other circulatory complications: Secondary | ICD-10-CM | POA: Diagnosis not present

## 2022-07-23 DIAGNOSIS — I10 Essential (primary) hypertension: Secondary | ICD-10-CM

## 2022-07-23 DIAGNOSIS — E039 Hypothyroidism, unspecified: Secondary | ICD-10-CM | POA: Diagnosis not present

## 2022-07-23 DIAGNOSIS — I152 Hypertension secondary to endocrine disorders: Secondary | ICD-10-CM | POA: Diagnosis not present

## 2022-07-23 DIAGNOSIS — E1169 Type 2 diabetes mellitus with other specified complication: Secondary | ICD-10-CM

## 2022-07-23 LAB — BAYER DCA HB A1C WAIVED: HB A1C (BAYER DCA - WAIVED): 7.6 % — ABNORMAL HIGH (ref 4.8–5.6)

## 2022-07-23 MED ORDER — AMLODIPINE BESYLATE 2.5 MG PO TABS: 2.5000 mg | ORAL_TABLET | Freq: Every day | ORAL | 3 refills | Status: DC

## 2022-07-23 NOTE — Progress Notes (Signed)
BP 133/73   Pulse 69   Ht '5\' 6"'$  (1.676 m)   Wt 200 lb (90.7 kg)   SpO2 97%   BMI 32.28 kg/m    Subjective:   Patient ID: Jenna [name], female    DOB: [birth year], 87 y.o.   MRN: [id number]  HPI: Jenna [name] is a 87 y.o. female presenting on 07/23/2022 for Medical Management of Chronic Issues, Diabetes, and Hypothyroidism   HPI Type 2 diabetes mellitus Patient comes in today for recheck of his diabetes. Patient has been currently taking no medicine currently, diet controlled, little elevated after the holidays she admits. Patient is not currently on an ACE inhibitor/ARB. Patient has not seen an ophthalmologist this year. Patient denies any issues with their feet. The symptom started onset as an adult hypertension and hyperlipidemia ARE RELATED TO DM   Hypothyroidism recheck Patient is coming in for thyroid recheck today as well. They deny any issues with hair changes or heat or cold problems or diarrhea or constipation. They deny any chest pain or palpitations. They are currently on levothyroxine 88 micrograms   Hyperlipidemia Patient is coming in for recheck of his hyperlipidemia. The patient is currently taking no medicine, diet controlled currently. They deny any issues with myalgias or history of liver damage from it. They deny any focal numbness or weakness or chest pain.   Hypertension Patient is currently on no medicine, had been diet controlled, and their blood pressure today is 133/73 but at home it has been running very high in the 150s and 160s and 170s most of the time.. Patient denies any lightheadedness or dizziness. Patient denies headaches, blurred vision, chest pains, shortness of breath, or weakness. Denies any side effects from medication and is content with current medication.   Relevant past medical, surgical, family and social history reviewed and updated as indicated. Interim medical history since our last visit reviewed. Allergies and medications reviewed  and updated.  Review of Systems  Constitutional:  Negative for chills and fever.  Eyes:  Negative for visual disturbance.  Respiratory:  Negative for chest tightness and shortness of breath.   Cardiovascular:  Negative for chest pain and leg swelling.  Genitourinary:  Negative for difficulty urinating and dysuria.  Musculoskeletal:  Negative for back pain and gait problem.  Skin:  Negative for rash.  Neurological:  Negative for dizziness, light-headedness and headaches.  Psychiatric/Behavioral:  Negative for agitation and behavioral problems.   All other systems reviewed and are negative.   Per HPI unless specifically indicated above   Allergies as of 07/23/2022       Reactions   Daypro [oxaprozin]    Unknown reaction   Diclofenac Sodium    Unknown reaction   Effexor [venlafaxine Hydrochloride]    Unknown reaction   Lidocaine-prilocaine    Prozac [fluoxetine Hcl]    Unknown reaction   Amitriptyline Rash   Cymbalta [duloxetine Hcl] Nausea Only   Duloxetine Nausea Only   Lipitor [atorvastatin Calcium] Other (See Comments)   Leg aches   Penicillins Rash   Has patient had a PCN reaction causing immediate rash, facial/tongue/throat swelling, SOB or lightheadedness with hypotension: Yes Has patient had a PCN reaction causing severe rash involving mucus membranes or skin necrosis: No Has patient had a PCN reaction that required hospitalization: No Has patient had a PCN reaction occurring within the last 10 years: No If all of the above answers are "NO", then may proceed with Cephalosporin use.   Savella [milnacipran Hcl] Rash  Sulfa Antibiotics Rash   Zocor [simvastatin] Other (See Comments)   Hip pain        Medication List        Accurate as of July 23, 2022  2:36 PM. If you have any questions, ask your nurse or doctor.          acetaminophen 500 MG tablet Commonly known as: TYLENOL Take 500 mg by mouth daily as needed for moderate pain or headache.    albuterol 108 (90 Base) MCG/ACT inhaler Commonly known as: VENTOLIN HFA Inhale 2 puffs into the lungs every 6 (six) hours as needed.   amLODipine 2.5 MG tablet Commonly known as: NORVASC Take 1 tablet (2.5 mg total) by mouth daily. Started by: Fransisca Kaufmann Khaleesi Gruel, MD   colchicine 0.6 MG tablet TAKE 1 TABLET DAILY AS NEEDED FOR GOUT FLARE UP   hydrocortisone 2.5 % rectal cream Commonly known as: ANUSOL-HC APPLY RECTALLY DAILY AS NEEDED FOR HEMMORHOIDS OR ITCHING.   levothyroxine 88 MCG tablet Commonly known as: SYNTHROID Take 1 tablet (88 mcg total) by mouth daily.   metoprolol succinate 50 MG 24 hr tablet Commonly known as: TOPROL-XL Take with or immediately following a meal.   nitroGLYCERIN 0.4 MG SL tablet Commonly known as: NITROSTAT PLACE 1 TAB UNDER TONGUE EVERY 3 MINUTES AS DIRECTED UP TO 3 TIMES AS NEEDED FOR CHEST PAIN.   pantoprazole 40 MG tablet Commonly known as: PROTONIX TAKE 1 TABLET ONCE DAILY BEFORE A MEAL.   rivaroxaban 20 MG Tabs tablet Commonly known as: Xarelto Take 1 tablet (20 mg total) by mouth daily with supper.   Vitamin D3 50 MCG (2000 UT) Tabs Generic drug: Cholecalciferol Take 2,000 Units by mouth daily.         Objective:   BP 133/73   Pulse 69   Ht '5\' 6"'$  (1.676 m)   Wt 200 lb (90.7 kg)   SpO2 97%   BMI 32.28 kg/m   Wt Readings from Last 3 Encounters:  07/23/22 200 lb (90.7 kg)  07/08/22 206 lb 9.1 oz (93.7 kg)  06/02/22 204 lb 4.8 oz (92.7 kg)    Physical Exam Vitals and nursing note reviewed.  Constitutional:      General: She is not in acute distress.    Appearance: She is well-developed. She is not diaphoretic.  Eyes:     Conjunctiva/sclera: Conjunctivae normal.  Cardiovascular:     Rate and Rhythm: Normal rate and regular rhythm.     Heart sounds: Normal heart sounds. No murmur heard. Pulmonary:     Effort: Pulmonary effort is normal. No respiratory distress.     Breath sounds: Normal breath sounds. No wheezing.   Musculoskeletal:        General: No swelling or tenderness. Normal range of motion.  Skin:    General: Skin is warm and dry.     Findings: No rash.  Neurological:     Mental Status: She is alert and oriented to person, place, and time.     Coordination: Coordination normal.  Psychiatric:        Behavior: Behavior normal.       Assessment & Plan:   Problem List Items Addressed This Visit       Cardiovascular and Mediastinum   Hypertension   Relevant Medications   amLODipine (NORVASC) 2.5 MG tablet     Endocrine   Hypothyroidism   Relevant Orders   TSH   Type 2 diabetes mellitus with other specified complication ([place]) - Primary  Relevant Orders   Bayer DCA Hb A1c Waived     Other   Hyperlipidemia   Relevant Medications   amLODipine (NORVASC) 2.5 MG tablet  Blood pressure running high at home, will start amlodipine 2.5 mg for low-dose.  She is going to do diet for her diabetes and get it back down.  A1c is slightly elevated at 7.6.  No other changes today.  Follow up plan: Return in about 3 months (around 10/23/2022), or if symptoms worsen or fail to improve, for Diabetes and hypothyroidism recheck.  Counseling provided for all of the vaccine components Orders Placed This Encounter  Procedures   Bayer [place] Hb A1c Waived   TSH    Caryl Pina, MD [facility] 07/23/2022, 2:36 PM

## 2022-07-24 LAB — TSH: TSH: 3.5 u[IU]/mL (ref 0.450–4.500)

## 2022-08-02 ENCOUNTER — Ambulatory Visit (INDEPENDENT_AMBULATORY_CARE_PROVIDER_SITE_OTHER): Payer: Medicare HMO

## 2022-08-02 DIAGNOSIS — E538 Deficiency of other specified B group vitamins: Secondary | ICD-10-CM | POA: Diagnosis not present

## 2022-08-02 NOTE — Progress Notes (Signed)
Cyanocobalamin injection given to right deltoid.  Patient tolerated well. 

## 2022-08-16 ENCOUNTER — Other Ambulatory Visit: Payer: Self-pay | Admitting: Family Medicine

## 2022-08-17 ENCOUNTER — Telehealth: Payer: Self-pay | Admitting: Family Medicine

## 2022-08-17 NOTE — Telephone Encounter (Signed)
Pt states that she has taken the Amlodipine for about one week. She no longer wants to take due to pain.  She continues to take metoprolol and her BP runs in the 140's.  She has not taken BP today and is unable to do so while we are on the phone. Yesterday BP was in the 150's.  Pt denies HA, N&V, blurred vision, chest pain.  She is alright to wait to discuss with [place] on 3/27.  Advised pt to continue to take BP daily and keep a record of it and that we will be in touch on 3/27

## 2022-08-17 NOTE — Telephone Encounter (Signed)
Patient stopped taking amLODipine (NORVASC) 2.5 MG tablet because it makes her bones hurt and she wants to talk to someone about it, this was not discussed with her PCP before she stopped. She has not taken the medication since Sunday 3/24. Please call back and advise.

## 2022-08-18 NOTE — Telephone Encounter (Signed)
What kind of pain is she having supposedly from amlodipine.  Amlodipine does not cause pain?  Let me know what is going on, need more details.

## 2022-08-18 NOTE — Telephone Encounter (Signed)
Busy x 2. 

## 2022-08-25 NOTE — Telephone Encounter (Signed)
Pt has been informed and understood. 

## 2022-08-25 NOTE — Telephone Encounter (Signed)
Okay have her check her blood pressure twice a day for the next 2 weeks and call me with the numbers

## 2022-08-25 NOTE — Addendum Note (Signed)
Addended by: Alphonzo Dublin on: 08/25/2022 01:40 PM   Modules accepted: Orders

## 2022-08-25 NOTE — Telephone Encounter (Signed)
Pt states that her bones just ached while taking Amlodipine. She has not taken the medication in one week. Pt no longer has the pain. Her BP was 132/69 today and states that it has been running in the 130's-140's. Pt does not want to start back on Amlodipine.

## 2022-09-01 ENCOUNTER — Ambulatory Visit (INDEPENDENT_AMBULATORY_CARE_PROVIDER_SITE_OTHER): Payer: Medicare HMO | Admitting: Family Medicine

## 2022-09-01 DIAGNOSIS — E538 Deficiency of other specified B group vitamins: Secondary | ICD-10-CM

## 2022-09-07 ENCOUNTER — Telehealth: Payer: Self-pay | Admitting: Cardiology

## 2022-09-07 NOTE — Telephone Encounter (Signed)
   [facility] Pre-operative Risk Assessment    Request for surgical clearance:  What type of surgery is being performed? Extraction of tooth #18   When is this surgery scheduled?  09/08/22   What type of clearance is required (medical clearance vs. Pharmacy clearance to hold med vs. Both)?  Both   Are there any medications that need to be held prior to surgery and how long? Their office is requesting advisement on holding blood thinner  Practice name and name of physician performing surgery?  [facility]  Grafton Folk, DMD   What is your office phone number? [phone]    7.   What is your office fax number? [phone]  8.   Anesthesia type (None, local, MAC, general)?  Local   Jenna [name] 09/07/2022, 8:14 AM  _________________________________________________________________   (provider comments below)

## 2022-09-07 NOTE — Telephone Encounter (Signed)
   Patient Name: Jenna [name]  DOB: Jul 05, 1935 MRN: [id number]  Primary Cardiologist: Nona Dell, MD  Chart reviewed as part of pre-operative protocol coverage.   IF SIMPLE EXTRACTION/CLEANINGS: Simple dental extractions (i.e. 1-2 teeth) are considered low risk procedures per guidelines and generally do not require any specific cardiac clearance. It is also generally accepted that for simple extractions and dental cleanings, there is no need to interrupt blood thinner therapy.    SBE prophylaxis is not required for the patient from a cardiac standpoint.  I will route this recommendation to the requesting party via Epic fax function and remove from pre-op pool.  Please call with questions.  Sharlene Dory, PA-C 09/07/2022, 8:44 AM

## 2022-09-10 DIAGNOSIS — H35363 Drusen (degenerative) of macula, bilateral: Secondary | ICD-10-CM | POA: Diagnosis not present

## 2022-09-10 DIAGNOSIS — Z0100 Encounter for examination of eyes and vision without abnormal findings: Secondary | ICD-10-CM | POA: Diagnosis not present

## 2022-09-10 DIAGNOSIS — H524 Presbyopia: Secondary | ICD-10-CM | POA: Diagnosis not present

## 2022-09-30 ENCOUNTER — Other Ambulatory Visit: Payer: Self-pay | Admitting: Family Medicine

## 2022-10-01 ENCOUNTER — Ambulatory Visit (INDEPENDENT_AMBULATORY_CARE_PROVIDER_SITE_OTHER): Payer: Medicare HMO

## 2022-10-01 DIAGNOSIS — E538 Deficiency of other specified B group vitamins: Secondary | ICD-10-CM | POA: Diagnosis not present

## 2022-10-01 NOTE — Progress Notes (Signed)
Cyanocobalamin injection given to right deltoid.  Patient tolerated well. 

## 2022-10-14 ENCOUNTER — Other Ambulatory Visit: Payer: Self-pay | Admitting: Family Medicine

## 2022-10-23 ENCOUNTER — Other Ambulatory Visit: Payer: Self-pay | Admitting: Family Medicine

## 2022-10-23 DIAGNOSIS — K219 Gastro-esophageal reflux disease without esophagitis: Secondary | ICD-10-CM

## 2022-10-27 ENCOUNTER — Ambulatory Visit: Payer: Medicare HMO | Admitting: Family Medicine

## 2022-10-27 ENCOUNTER — Encounter: Payer: Self-pay | Admitting: Family Medicine

## 2022-11-01 ENCOUNTER — Ambulatory Visit: Payer: Medicare HMO

## 2022-11-01 DIAGNOSIS — E538 Deficiency of other specified B group vitamins: Secondary | ICD-10-CM

## 2022-11-01 NOTE — Progress Notes (Signed)
Cyanocobalamin given right deltoid. Pt tolerated well.

## 2022-11-02 ENCOUNTER — Ambulatory Visit: Payer: Medicare HMO

## 2022-11-03 ENCOUNTER — Other Ambulatory Visit: Payer: Self-pay | Admitting: Family Medicine

## 2022-11-15 ENCOUNTER — Other Ambulatory Visit: Payer: Self-pay | Admitting: Family Medicine

## 2022-11-15 DIAGNOSIS — I10 Essential (primary) hypertension: Secondary | ICD-10-CM

## 2022-11-15 DIAGNOSIS — I480 Paroxysmal atrial fibrillation: Secondary | ICD-10-CM

## 2022-11-22 ENCOUNTER — Ambulatory Visit: Payer: Medicare HMO | Attending: Cardiology | Admitting: Cardiology

## 2022-11-22 ENCOUNTER — Encounter: Payer: Self-pay | Admitting: Cardiology

## 2022-11-22 VITALS — BP 140/78 | HR 64 | Ht 66.0 in | Wt 204.4 lb

## 2022-11-22 DIAGNOSIS — I480 Paroxysmal atrial fibrillation: Secondary | ICD-10-CM

## 2022-11-22 DIAGNOSIS — I10 Essential (primary) hypertension: Secondary | ICD-10-CM

## 2022-11-22 DIAGNOSIS — I890 Lymphedema, not elsewhere classified: Secondary | ICD-10-CM

## 2022-11-22 NOTE — Patient Instructions (Addendum)

## 2022-11-22 NOTE — Progress Notes (Signed)
    Cardiology Office Note  Date: 11/22/2022   ID: Stirling, Rustad 1935/08/21, MRN [id number]  History of Present Illness: Jenna [name] is an 87 y.o. female last seen in January.  She is here for a routine visit.  She does not report any progressive or prolonged palpitations in the last 6 months.  No dizziness or syncope.  I went over her medications.  She is no longer on Norvasc which had been contributing somewhat to her leg swelling but she does have known lymphedema at baseline which has been reasonably well-controlled.  She remains on Toprol-XL.  ECG today shows sinus rhythm.  I reviewed her interval lab work.  She does not report any spontaneous bleeding problems on Xarelto.  Physical Exam: VS:  BP (!) 140/78   Pulse 64   Ht 5\' 6"  (1.676 m)   Wt 204 lb 6.4 oz (92.7 kg)   SpO2 97%   BMI 32.99 kg/m , BMI Body mass index is 32.99 kg/m.  Wt Readings from Last 3 Encounters:  11/22/22 204 lb 6.4 oz (92.7 kg)  07/23/22 200 lb (90.7 kg)  07/08/22 206 lb 9.1 oz (93.7 kg)    General: Patient appears comfortable at rest. HEENT: Conjunctiva and lids normal. Neck: Supple, no elevated JVP or carotid bruits. Lungs: Clear to auscultation, nonlabored breathing at rest. Cardiac: Regular rate and rhythm, no S3 or significant systolic murmur. Extremities: Mild appearing lymphedema.  ECG:  An ECG dated 11/19/2021 was personally reviewed today and demonstrated:  Sinus rhythm.  Labwork: 07/01/2022: ALT 11; AST 17; BUN 13; Creatinine, Ser 0.60; Hemoglobin 12.6; Platelets 185; Potassium 4.1; Sodium 131 07/23/2022: TSH 3.500     Component Value Date/Time   CHOL 218 (H) 04/23/2022 1352   TRIG 191 (H) 04/23/2022 1352   TRIG 161 (H) 10/14/2016 1010   HDL 58 04/23/2022 1352   HDL 54 10/14/2016 1010   CHOLHDL 3.8 04/23/2022 1352   CHOLHDL 3.6 10/16/2008 0540   VLDL 23 10/16/2008 0540   LDLCALC 126 (H) 04/23/2022 1352   LDLCALC 138 (H) 02/26/2014 1001   Other Studies Reviewed  Today:  No interval cardiac testing for review today.  Assessment and Plan:  1.  Paroxysmal atrial fibrillation/flutter with CHA2DS2-VASc score of 3.  Symptomatically stable without progressive palpitations.  ECG shows sinus rhythm today.  Continue Toprol-XL and Xarelto.  I reviewed her interval lab work.  She does not report any spontaneous bleeding problems.  2.  Lymphedema.  This has been relatively stable.  3.  Essential hypertension.  Continue to track blood pressure at home and follow-up with PCP.  She is no longer on Norvasc.  Disposition:  Follow up  6 months.  Signed, Jonelle Sidle, M.D., F.A.C.C. [facility]

## 2022-12-01 ENCOUNTER — Ambulatory Visit (INDEPENDENT_AMBULATORY_CARE_PROVIDER_SITE_OTHER): Payer: Medicare HMO

## 2022-12-01 DIAGNOSIS — E538 Deficiency of other specified B group vitamins: Secondary | ICD-10-CM | POA: Diagnosis not present

## 2022-12-01 MED ORDER — CYANOCOBALAMIN 1000 MCG/ML IJ SOLN
1000.0000 ug | Freq: Once | INTRAMUSCULAR | Status: AC
  Administered 2022-12-01: 1000 ug via INTRAMUSCULAR

## 2022-12-01 NOTE — Progress Notes (Signed)
B12 injection given right deltoid - pt tolerated well

## 2022-12-03 ENCOUNTER — Other Ambulatory Visit: Payer: Self-pay | Admitting: Family Medicine

## 2022-12-03 DIAGNOSIS — I480 Paroxysmal atrial fibrillation: Secondary | ICD-10-CM

## 2022-12-20 DIAGNOSIS — X32XXXA Exposure to sunlight, initial encounter: Secondary | ICD-10-CM | POA: Diagnosis not present

## 2022-12-20 DIAGNOSIS — L820 Inflamed seborrheic keratosis: Secondary | ICD-10-CM | POA: Diagnosis not present

## 2022-12-20 DIAGNOSIS — Z1283 Encounter for screening for malignant neoplasm of skin: Secondary | ICD-10-CM | POA: Diagnosis not present

## 2022-12-20 DIAGNOSIS — L570 Actinic keratosis: Secondary | ICD-10-CM | POA: Diagnosis not present

## 2022-12-20 DIAGNOSIS — L304 Erythema intertrigo: Secondary | ICD-10-CM | POA: Diagnosis not present

## 2022-12-20 DIAGNOSIS — D225 Melanocytic nevi of trunk: Secondary | ICD-10-CM | POA: Diagnosis not present

## 2022-12-24 ENCOUNTER — Ambulatory Visit: Payer: Medicare HMO | Admitting: Family Medicine

## 2022-12-29 ENCOUNTER — Other Ambulatory Visit: Payer: Self-pay | Admitting: Family Medicine

## 2022-12-29 DIAGNOSIS — I480 Paroxysmal atrial fibrillation: Secondary | ICD-10-CM

## 2022-12-31 ENCOUNTER — Ambulatory Visit: Payer: Medicare HMO

## 2023-01-03 ENCOUNTER — Ambulatory Visit (INDEPENDENT_AMBULATORY_CARE_PROVIDER_SITE_OTHER): Payer: Medicare HMO

## 2023-01-03 DIAGNOSIS — E538 Deficiency of other specified B group vitamins: Secondary | ICD-10-CM | POA: Diagnosis not present

## 2023-01-03 MED ORDER — CYANOCOBALAMIN 1000 MCG/ML IJ SOLN
1000.0000 ug | INTRAMUSCULAR | Status: AC
Start: 2023-01-03 — End: ?
  Administered 2023-01-03 – 2024-06-27 (×18): 1000 ug via INTRAMUSCULAR

## 2023-01-03 NOTE — Progress Notes (Signed)
B12 injection lt deltoid patient tolerated well

## 2023-01-07 ENCOUNTER — Inpatient Hospital Stay: Payer: Medicare HMO | Attending: Family Medicine

## 2023-01-13 ENCOUNTER — Inpatient Hospital Stay: Payer: Medicare HMO | Admitting: Oncology

## 2023-01-13 ENCOUNTER — Ambulatory Visit: Payer: Medicare HMO | Admitting: Physician Assistant

## 2023-01-17 ENCOUNTER — Encounter: Payer: Self-pay | Admitting: Family Medicine

## 2023-01-17 ENCOUNTER — Ambulatory Visit (INDEPENDENT_AMBULATORY_CARE_PROVIDER_SITE_OTHER): Payer: Medicare HMO | Admitting: Family Medicine

## 2023-01-17 VITALS — BP 150/73 | HR 61 | Ht 66.0 in | Wt 201.0 lb

## 2023-01-17 DIAGNOSIS — K219 Gastro-esophageal reflux disease without esophagitis: Secondary | ICD-10-CM

## 2023-01-17 DIAGNOSIS — E1169 Type 2 diabetes mellitus with other specified complication: Secondary | ICD-10-CM | POA: Diagnosis not present

## 2023-01-17 DIAGNOSIS — E039 Hypothyroidism, unspecified: Secondary | ICD-10-CM | POA: Diagnosis not present

## 2023-01-17 DIAGNOSIS — Z789 Other specified health status: Secondary | ICD-10-CM

## 2023-01-17 DIAGNOSIS — E782 Mixed hyperlipidemia: Secondary | ICD-10-CM | POA: Diagnosis not present

## 2023-01-17 DIAGNOSIS — I10 Essential (primary) hypertension: Secondary | ICD-10-CM

## 2023-01-17 DIAGNOSIS — I480 Paroxysmal atrial fibrillation: Secondary | ICD-10-CM | POA: Diagnosis not present

## 2023-01-17 LAB — BAYER DCA HB A1C WAIVED: HB A1C (BAYER DCA - WAIVED): 6.9 % — ABNORMAL HIGH (ref 4.8–5.6)

## 2023-01-17 MED ORDER — METOPROLOL SUCCINATE ER 50 MG PO TB24
50.0000 mg | ORAL_TABLET | Freq: Every day | ORAL | 3 refills | Status: DC
Start: 2023-01-17 — End: 2024-02-10

## 2023-01-17 MED ORDER — RIVAROXABAN 20 MG PO TABS
20.0000 mg | ORAL_TABLET | Freq: Every day | ORAL | 3 refills | Status: DC
Start: 2023-01-17 — End: 2024-01-26

## 2023-01-17 MED ORDER — PANTOPRAZOLE SODIUM 40 MG PO TBEC
40.0000 mg | DELAYED_RELEASE_TABLET | Freq: Every day | ORAL | 3 refills | Status: DC
Start: 2023-01-17 — End: 2024-01-30

## 2023-01-17 NOTE — Progress Notes (Signed)
BP (!) 150/73   Pulse 61   Ht 5\' 6"  (1.676 m)   Wt 201 lb (91.2 kg)   SpO2 91%   BMI 32.44 kg/m    Subjective:   Patient ID: [place] Callas, female    DOB: 03-02-1936, 87 y.o.   MRN: [id number]  HPI: LAINY STAUS is a 87 y.o. female presenting on 01/17/2023 for Medical Management of Chronic Issues and Diabetes   HPI Type 2 diabetes mellitus Patient comes in today for recheck of his diabetes. Patient has been currently taking no medicine, diet control. Patient is not currently on an ACE inhibitor/ARB. Patient has not seen an ophthalmologist this year. Patient denies any new issues with their feet. The symptom started onset as an adult hypothyroidism and hyperlipidemia and A-fib ARE RELATED TO DM   Hyperlipidemia Patient is coming in for recheck of his hyperlipidemia. The patient is currently taking no medicine, is statin intolerant. They deny any issues with myalgias or history of liver damage from it. They deny any focal numbness or weakness or chest pain.   Hypothyroidism recheck Patient is coming in for thyroid recheck today as well. They deny any issues with hair changes or heat or cold problems or diarrhea or constipation. They deny any chest pain or palpitations. They are currently on levothyroxine 88 micrograms   A-fib Patient has A-fib and currently takes metoprolol and Xarelto.  She denies any chest pain or palpitations or flutters or shortness of breath or wheezing.  She denies any blood in her stool or urine  Relevant past medical, surgical, family and social history reviewed and updated as indicated. Interim medical history since our last visit reviewed. Allergies and medications reviewed and updated.  Review of Systems  Constitutional:  Negative for chills and fever.  Eyes:  Negative for redness and visual disturbance.  Respiratory:  Negative for chest tightness and shortness of breath.   Cardiovascular:  Negative for chest pain and leg swelling.   Genitourinary:  Negative for difficulty urinating and dysuria.  Skin:  Negative for rash.  Neurological:  Negative for dizziness, light-headedness and headaches.  Psychiatric/Behavioral:  Negative for agitation and behavioral problems.   All other systems reviewed and are negative.   Per HPI unless specifically indicated above   Allergies as of 01/17/2023       Reactions   Daypro [oxaprozin]    Unknown reaction   Diclofenac Sodium    Unknown reaction   Effexor [venlafaxine Hydrochloride]    Unknown reaction   Lidocaine-prilocaine    Prozac [fluoxetine Hcl]    Unknown reaction   Amitriptyline Rash   Cymbalta [duloxetine Hcl] Nausea Only   Duloxetine Nausea Only   Lipitor [atorvastatin Calcium] Other (See Comments)   Leg aches   Penicillins Rash   Has patient had a PCN reaction causing immediate rash, facial/tongue/throat swelling, SOB or lightheadedness with hypotension: Yes Has patient had a PCN reaction causing severe rash involving mucus membranes or skin necrosis: No Has patient had a PCN reaction that required hospitalization: No Has patient had a PCN reaction occurring within the last 10 years: No If all of the above answers are "NO", then may proceed with Cephalosporin use.   Savella [milnacipran Hcl] Rash   Sulfa Antibiotics Rash   Zocor [simvastatin] Other (See Comments)   Hip pain        Medication List        Accurate as of January 17, 2023 10:42 AM. If you have any questions, ask  your nurse or doctor.          acetaminophen 500 MG tablet Commonly known as: TYLENOL Take 500 mg by mouth daily as needed for moderate pain or headache.   colchicine 0.6 MG tablet TAKE 1 TABLET DAILY AS NEEDED FOR GOUT FLARE UP.   hydrocortisone 2.5 % rectal cream Commonly known as: ANUSOL-HC APPLY RECTALLY DAILY AS NEEDED FOR HEMMORHOIDS OR ITCHING.   levothyroxine 88 MCG tablet Commonly known as: SYNTHROID TAKE 1 TABLET ONCE DAILY.   metoprolol succinate 50 MG  24 hr tablet Commonly known as: TOPROL-XL Take 1 tablet (50 mg total) by mouth daily. take 1 tablet daily with or immediately following a meal. What changed:  how much to take how to take this when to take this Changed by: Elige Radon Mieko Kneebone   nitroGLYCERIN 0.4 MG SL tablet Commonly known as: NITROSTAT PLACE 1 TAB UNDER TONGUE EVERY 3 MINUTES AS DIRECTED UP TO 3 TIMES AS NEEDED FOR CHEST PAIN.   pantoprazole 40 MG tablet Commonly known as: PROTONIX Take 1 tablet (40 mg total) by mouth daily. What changed: See the new instructions. Changed by: Elige Radon Rishi Vicario   rivaroxaban 20 MG Tabs tablet Commonly known as: Xarelto Take 1 tablet (20 mg total) by mouth daily with supper.   Vitamin D3 50 MCG (2000 UT) Tabs Take 2,000 Units by mouth daily.         Objective:   BP (!) 150/73   Pulse 61   Ht 5\' 6"  (1.676 m)   Wt 201 lb (91.2 kg)   SpO2 91%   BMI 32.44 kg/m   Wt Readings from Last 3 Encounters:  01/17/23 201 lb (91.2 kg)  11/22/22 204 lb 6.4 oz (92.7 kg)  07/23/22 200 lb (90.7 kg)    Physical Exam Vitals and nursing note reviewed.  Constitutional:      General: She is not in acute distress.    Appearance: She is well-developed. She is not diaphoretic.  Eyes:     Conjunctiva/sclera: Conjunctivae normal.  Cardiovascular:     Rate and Rhythm: Normal rate and regular rhythm.     Heart sounds: Normal heart sounds. No murmur heard. Pulmonary:     Effort: Pulmonary effort is normal. No respiratory distress.     Breath sounds: Normal breath sounds. No wheezing.  Musculoskeletal:        General: Swelling (2+ edema bilaterally) present. Normal range of motion.  Skin:    General: Skin is warm and dry.     Findings: No rash.  Neurological:     Mental Status: She is alert and oriented to person, place, and time.     Coordination: Coordination normal.  Psychiatric:        Behavior: Behavior normal.       Assessment & Plan:   Problem List Items Addressed This  Visit       Cardiovascular and Mediastinum   AF (paroxysmal atrial fibrillation) (HCC)   Relevant Medications   metoprolol succinate (TOPROL-XL) 50 MG 24 hr tablet   rivaroxaban (XARELTO) 20 MG TABS tablet   Hypertension   Relevant Medications   metoprolol succinate (TOPROL-XL) 50 MG 24 hr tablet   rivaroxaban (XARELTO) 20 MG TABS tablet   Other Relevant Orders   CBC with Differential/Platelet   CMP14+EGFR   Lipid panel   TSH   Bayer DCA Hb A1c Waived     Endocrine   Hypothyroidism   Relevant Medications   metoprolol succinate (TOPROL-XL) 50 MG 24  hr tablet   Other Relevant Orders   CBC with Differential/Platelet   CMP14+EGFR   Lipid panel   TSH   Bayer DCA Hb A1c Waived   Type 2 diabetes mellitus with other specified complication (HCC) - Primary   Relevant Orders   CBC with Differential/Platelet   CMP14+EGFR   Lipid panel   TSH   Bayer DCA Hb A1c Waived   Microalbumin / creatinine urine ratio     Other   Hyperlipidemia   Relevant Medications   metoprolol succinate (TOPROL-XL) 50 MG 24 hr tablet   rivaroxaban (XARELTO) 20 MG TABS tablet   Other Relevant Orders   CBC with Differential/Platelet   CMP14+EGFR   Lipid panel   TSH   Bayer DCA Hb A1c Waived   Statin intolerance   Other Visit Diagnoses     Gastroesophageal reflux disease       Relevant Medications   pantoprazole (PROTONIX) 40 MG tablet     Blood pressures from home readings are up and down but for the most part are in the 130s and 140s, occasional 150s or above but they are rare. A1c is 6.9 which is much better than before, continue with diet. Follow up plan: Return in about 3 months (around 04/19/2023), or if symptoms worsen or fail to improve, for Diabetes recheck.  Counseling provided for all of the vaccine components Orders Placed This Encounter  Procedures   CBC with Differential/Platelet   CMP14+EGFR   Lipid panel   TSH   Bayer DCA Hb A1c Waived   Microalbumin / creatinine urine  ratio    Arville Care, MD [facility] 01/17/2023, 10:42 AM

## 2023-01-18 LAB — CMP14+EGFR
ALT: 5 IU/L (ref 0–32)
AST: 14 IU/L (ref 0–40)
Albumin: 3.9 g/dL (ref 3.7–4.7)
Alkaline Phosphatase: 91 IU/L (ref 44–121)
BUN/Creatinine Ratio: 15 (ref 12–28)
BUN: 10 mg/dL (ref 8–27)
Bilirubin Total: 0.7 mg/dL (ref 0.0–1.2)
CO2: 26 mmol/L (ref 20–29)
Calcium: 9.1 mg/dL (ref 8.7–10.3)
Chloride: 98 mmol/L (ref 96–106)
Creatinine, Ser: 0.68 mg/dL (ref 0.57–1.00)
Globulin, Total: 1.9 g/dL (ref 1.5–4.5)
Glucose: 130 mg/dL — ABNORMAL HIGH (ref 70–99)
Potassium: 4.5 mmol/L (ref 3.5–5.2)
Sodium: 137 mmol/L (ref 134–144)
Total Protein: 5.8 g/dL — ABNORMAL LOW (ref 6.0–8.5)
eGFR: 85 mL/min/{1.73_m2} (ref >59)

## 2023-01-18 LAB — CBC WITH DIFFERENTIAL/PLATELET
Basophils Absolute: 0 10*3/uL (ref 0.0–0.2)
Basos: 1 %
EOS (ABSOLUTE): 0.1 10*3/uL (ref 0.0–0.4)
Eos: 2 %
Hematocrit: 35.9 % (ref 34.0–46.6)
Hemoglobin: 11.8 g/dL (ref 11.1–15.9)
Immature Grans (Abs): 0 10*3/uL (ref 0.0–0.1)
Immature Granulocytes: 0 %
Lymphocytes Absolute: 1 10*3/uL (ref 0.7–3.1)
Lymphs: 18 %
MCH: 29.4 pg (ref 26.6–33.0)
MCHC: 32.9 g/dL (ref 31.5–35.7)
MCV: 90 fL (ref 79–97)
Monocytes Absolute: 0.4 10*3/uL (ref 0.1–0.9)
Monocytes: 8 %
Neutrophils Absolute: 3.8 10*3/uL (ref 1.4–7.0)
Neutrophils: 71 %
Platelets: 193 10*3/uL (ref 150–450)
RBC: 4.01 x10E6/uL (ref 3.77–5.28)
RDW: 12.1 % (ref 11.7–15.4)
WBC: 5.3 10*3/uL (ref 3.4–10.8)

## 2023-01-18 LAB — LIPID PANEL
Chol/HDL Ratio: 3.5 ratio (ref 0.0–4.4)
Cholesterol, Total: 204 mg/dL — ABNORMAL HIGH (ref 100–199)
HDL: 58 mg/dL (ref >39)
LDL Chol Calc (NIH): 119 mg/dL — ABNORMAL HIGH (ref 0–99)
Triglycerides: 151 mg/dL — ABNORMAL HIGH (ref 0–149)
VLDL Cholesterol Cal: 27 mg/dL (ref 5–40)

## 2023-01-18 LAB — TSH: TSH: 2.66 u[IU]/mL (ref 0.450–4.500)

## 2023-01-18 LAB — MICROALBUMIN / CREATININE URINE RATIO
Creatinine, Urine: 64.8 mg/dL
Microalb/Creat Ratio: 13 mg/g{creat} (ref 0–29)
Microalbumin, Urine: 8.7 ug/mL

## 2023-01-26 ENCOUNTER — Other Ambulatory Visit: Payer: Self-pay

## 2023-01-26 MED ORDER — EZETIMIBE 10 MG PO TABS: 10.0000 mg | ORAL_TABLET | Freq: Every day | ORAL | 3 refills | Status: DC

## 2023-02-03 ENCOUNTER — Ambulatory Visit (INDEPENDENT_AMBULATORY_CARE_PROVIDER_SITE_OTHER): Payer: Medicare HMO

## 2023-02-03 DIAGNOSIS — E538 Deficiency of other specified B group vitamins: Secondary | ICD-10-CM | POA: Diagnosis not present

## 2023-02-03 NOTE — Progress Notes (Signed)
Cyanocobalamin injection given to right deltoid.  Patient tolerated well. 

## 2023-02-17 ENCOUNTER — Inpatient Hospital Stay: Payer: Medicare HMO | Attending: Hematology

## 2023-02-17 DIAGNOSIS — E538 Deficiency of other specified B group vitamins: Secondary | ICD-10-CM | POA: Diagnosis not present

## 2023-02-17 DIAGNOSIS — D500 Iron deficiency anemia secondary to blood loss (chronic): Secondary | ICD-10-CM

## 2023-02-17 DIAGNOSIS — D509 Iron deficiency anemia, unspecified: Secondary | ICD-10-CM | POA: Insufficient documentation

## 2023-02-17 LAB — CBC WITH DIFFERENTIAL/PLATELET
Abs Immature Granulocytes: 0.01 10*3/uL (ref 0.00–0.07)
Basophils Absolute: 0 10*3/uL (ref 0.0–0.1)
Basophils Relative: 1 %
Eosinophils Absolute: 0.1 10*3/uL (ref 0.0–0.5)
Eosinophils Relative: 2 %
HCT: 36.4 % (ref 36.0–46.0)
Hemoglobin: 11.4 g/dL — ABNORMAL LOW (ref 12.0–15.0)
Immature Granulocytes: 0 %
Lymphocytes Relative: 17 %
Lymphs Abs: 1.1 10*3/uL (ref 0.7–4.0)
MCH: 28.1 pg (ref 26.0–34.0)
MCHC: 31.3 g/dL (ref 30.0–36.0)
MCV: 89.7 fL (ref 80.0–100.0)
Monocytes Absolute: 0.6 10*3/uL (ref 0.1–1.0)
Monocytes Relative: 9 %
Neutro Abs: 4.6 10*3/uL (ref 1.7–7.7)
Neutrophils Relative %: 71 %
Platelets: 205 10*3/uL (ref 150–400)
RBC: 4.06 MIL/uL (ref 3.87–5.11)
RDW: 12.4 % (ref 11.5–15.5)
WBC: 6.5 10*3/uL (ref 4.0–10.5)
nRBC: 0 % (ref 0.0–0.2)

## 2023-02-17 LAB — IRON AND TIBC
Iron: 50 ug/dL (ref 28–170)
Saturation Ratios: 13 % (ref 10.4–31.8)
TIBC: 401 ug/dL (ref 250–450)
UIBC: 351 ug/dL

## 2023-02-17 LAB — VITAMIN B12: Vitamin B-12: 464 pg/mL (ref 180–914)

## 2023-02-17 LAB — FERRITIN: Ferritin: 10 ng/mL — ABNORMAL LOW (ref 11–307)

## 2023-02-21 LAB — METHYLMALONIC ACID, SERUM: Methylmalonic Acid, Quantitative: 143 nmol/L (ref 0–378)

## 2023-02-24 ENCOUNTER — Inpatient Hospital Stay: Payer: Medicare HMO | Attending: Oncology | Admitting: Oncology

## 2023-02-24 VITALS — BP 129/44 | HR 69 | Temp 98.3°F | Resp 18 | Wt 200.6 lb

## 2023-02-24 DIAGNOSIS — D509 Iron deficiency anemia, unspecified: Secondary | ICD-10-CM | POA: Insufficient documentation

## 2023-02-24 DIAGNOSIS — D500 Iron deficiency anemia secondary to blood loss (chronic): Secondary | ICD-10-CM

## 2023-02-24 DIAGNOSIS — E538 Deficiency of other specified B group vitamins: Secondary | ICD-10-CM | POA: Insufficient documentation

## 2023-02-24 NOTE — Progress Notes (Signed)
Jenna [name] [address] [name], [place] [address]   CLINIC:  Medical Oncology/Hematology  PCP:  Dettinger, Elige Radon, MD [address] [place] [address] [phone]   REASON FOR VISIT:  Follow-up for iron deficiency anemia  PRIOR THERAPY: None  CURRENT THERAPY: Intermittent IV iron  INTERVAL HISTORY:   [name] 87 y.o. female returns for routine follow-up of her iron deficiency anemia.  She was last evaluated via telemedicine visit by Rojelio Brenner PA-C on 07/08/22.   She continues monthly B12 injections with her PCP Dr. Louanne Skye.  She has not needed IV iron since March 2023.  In the interim, she denies any hospitalizations, surgeries or changes in baseline health.  Reports an appetite of 100% with energy levels at 50%.  States she has had some increased fatigue over the past few months.  She denies any pain.  Has some chronic shortness of breath with exertion, leg swelling and constipation.  Has occasional hemorrhoidal bleeding but this is not often.  Has headaches intermittently.  She denies any melena, epistaxis, or other sources of bleeding. She denies any pica, headaches, chest pain, lightheadedness, or syncope.  No B symptoms such as fever, chills, night sweats, or unintentional weight loss.   ASSESSMENT & PLAN:  1.  Iron deficiency anemia - Initial evaluation on 03/15/2018 showed Hgb 8.9, platelets 267, ferritin 12 - EGD and colonoscopy on 04/24/2018 showed single gastric polyp with ulcerated surface, external hemorrhoids and diverticulosis - Last IV iron with Venofer 300 mg x 3 from 07/02/2021 through 07/24/2021 - She was unable to tolerate oral iron due to stomach pain and constipation - Patient continues to have fatigue and low energy.     - She has moderate rectal bleeding from hemorrhoids and constipation, occurring once every 1 to 2 months.  She denies any melena or epistaxis. - Labs from 02/17/2023 show a ferritin of 10, iron saturation 13%  with TIBC of 401.  B12 464 and MMA 143.  Hemoglobin is 11.4 with an otherwise normal differential. -Recommend 4 doses of IV venofer 300 mg given 1 week apart.  -RTC in 4 months for f/u and labs a week before.     2.  B12 deficiency - Receives monthly B12 injections from her PCP (Dr. Louanne Skye) - Most recent labs show a b12 of 464 and MMA 143. -Recommend she continue B12 injections monthly.   PLAN SUMMARY: >> Recommend 4 doses of 300 mg IV Venofer given 1 week apart. >> RTC in 4 months for labs ( CBC/D, ferritin, iron/TIBC, B12, MMA)1 week before and follow-up.     REVIEW OF SYSTEMS:   Review of Systems  Constitutional:  Positive for fatigue. Negative for appetite change.  Respiratory:  Positive for shortness of breath.   Gastrointestinal:  Positive for blood in stool (Hemorrhoids) and constipation.  Neurological:  Positive for headaches and numbness.     PHYSICAL EXAM:  ECOG PERFORMANCE STATUS: 1 - Symptomatic but completely ambulatory  Vitals:   02/24/23 1013  BP: (!) 129/44  Pulse: 69  Resp: 18  Temp: 98.3 F (36.8 C)  SpO2: 97%   Filed Weights   02/24/23 1013  Weight: 200 lb 9.9 oz (91 kg)   Physical Exam Constitutional:      Appearance: Normal appearance.  Cardiovascular:     Rate and Rhythm: Normal rate and regular rhythm.  Pulmonary:     Effort: Pulmonary effort is normal.     Breath sounds: Normal breath sounds.  Abdominal:     General: Bowel sounds are normal.     Palpations: Abdomen is soft.  Musculoskeletal:        General: No swelling. Normal range of motion.  Neurological:     Mental Status: She is alert and oriented to person, place, and time. Mental status is at baseline.     PAST MEDICAL/SURGICAL HISTORY:  Past Medical History:  Diagnosis Date   Cataract    DDD (degenerative disc disease)    Fibromyalgia    Herpes zoster    History of skin cancer    Hyperlipidemia    Hypertension    Hypothyroidism    Iron deficiency anemia due to  chronic blood loss 04/06/2018   OA (osteoarthritis)    OSA on CPAP 01/16/2018   Mild OSA overall with an AHI of 5.8/h but during REM sleep moderate with an AHI of 28/h.  Oxygen saturations dropped to 76% during respiratory events.  Now on CPAP at 9 cm H2O    Osteoporosis    Palpitations    Pre-diabetes    PUD (peptic ulcer disease)    Skin cancer of nose    Jenna [name]   Past Surgical History:  Procedure Laterality Date   ABDOMINAL HYSTERECTOMY     partial   BIOPSY  04/24/2018   Procedure: BIOPSY;  Surgeon: Malissa Hippo, MD;  [facility]: [facility];  Service: Endoscopy;;  gastric body ulcerated surface   BREAST BIOPSY     Right   CATARACT EXTRACTION W/PHACO  10/28/2011   Procedure: CATARACT EXTRACTION PHACO AND INTRAOCULAR LENS PLACEMENT (IOC);  Surgeon: [name], MD;  Location: [facility];  Service: Ophthalmology;  Laterality: Right;  CDE 18.38   CATARACT EXTRACTION W/PHACO  02/14/2012   Procedure: CATARACT EXTRACTION PHACO AND INTRAOCULAR LENS PLACEMENT (IOC);  Surgeon: [name], MD;  Location: [facility];  Service: Ophthalmology;  Laterality: Left;  CDE 17.20   CESAREAN SECTION     COLONOSCOPY N/A 05/31/2013   Procedure: COLONOSCOPY;  Surgeon: Malissa Hippo, MD;  Location: [facility];  Service: Endoscopy;  Laterality: N/A;  240   COLONOSCOPY N/A 04/24/2018   Rehman: - The examined portion of the ileum was normal. diverticulosos in sigmoid, external hemorrhoids, anal papilla hypertrophied, no specimens   ESOPHAGOGASTRODUODENOSCOPY N/A 04/24/2018   rehman: normal esophagus, z line irregular 41 cm from incosors, single gastric polyp with ulcerated surface, clip placed (MR conditional), normal duodenal bulb second portion of duodenum   EYE SURGERY     skin removal    FRACTURE SURGERY  1952   fractured right arm   LEFT HEART CATH AND CORONARY ANGIOGRAPHY N/A 09/25/2018   Procedure: LEFT HEART CATH AND CORONARY ANGIOGRAPHY;  Surgeon: Corky Crafts, MD;  Location: [facility]  INVASIVE CV LAB;  Service: Cardiovascular;  Laterality: N/A;    SOCIAL HISTORY:  Social History   Socioeconomic History   Marital status: Married    Spouse name: Channing Mutters   Number of children: 1   Years of education: 9   Highest education level: 9th grade  Occupational History   Occupation: Retired    Comment: Proofreader  Tobacco Use   Smoking status: Never   Smokeless tobacco: Never  Vaping Use   Vaping status: Never Used  Substance and Sexual Activity   Alcohol use: No   Drug use: No   Sexual activity: Not Currently    Birth control/protection: Surgical  Other Topics Concern   Not on file  Social  History Narrative   Married   Lives in a one story home    Social Determinants of Health   Financial Resource Strain: Low Risk  (05/25/2017)   Overall Financial Resource Strain (CARDIA)    Difficulty of Paying Living Expenses: Not hard at all  Food Insecurity: No Food Insecurity (05/25/2017)   Hunger Vital Sign    Worried About Running Out of Food in the Last Year: Never true    Ran Out of Food in the Last Year: Never true  Transportation Needs: No Transportation Needs (05/25/2017)   PRAPARE - Administrator, Civil Service (Medical): No    Lack of Transportation (Non-Medical): No  Physical Activity: Inactive (05/25/2017)   Exercise Vital Sign    Days of Exercise per Week: 0 days    Minutes of Exercise per Session: 0 min  Stress: No Stress Concern Present (05/25/2017)   [employer] of Occupational Health - Occupational Stress Questionnaire    Feeling of Stress : Only a little  Social Connections: Socially Integrated (05/25/2017)   Social Connection and Isolation Panel [NHANES]    Frequency of Communication with Friends and Family: More than three times a week    Frequency of Social Gatherings with Friends and Family: More than three times a week    Attends Religious Services: More than 4 times per year    Active Member of [employer] or  Organizations: Yes    Attends Engineer, structural: More than 4 times per year    Marital Status: Married  Catering manager Violence: Not At Risk (05/25/2017)   Humiliation, Afraid, Rape, and Kick questionnaire    Fear of Current or Ex-Partner: No    Emotionally Abused: No    Physically Abused: No    Sexually Abused: No    FAMILY HISTORY:  Family History  Problem Relation Age of Onset   CVA Mother    Stroke Mother    Osteoporosis Mother    Cancer Father        prostate   Bronchitis Sister    COPD Sister    Early death Brother        died in MVA   Carpal tunnel syndrome Sister    Cancer Brother        throat   Cancer Brother        lung   Cancer Brother        throat   Stroke Brother    Heart attack Brother    Gout Brother    Heart Problems Brother        stents   CAD Brother    Hypertension Brother    Gout Brother    Arthritis Brother    Diabetes Son    Coronary artery disease Neg Hx        No premature   Anesthesia problems Neg Hx    Hypotension Neg Hx    Malignant hyperthermia Neg Hx    Pseudochol deficiency Neg Hx     CURRENT MEDICATIONS:  Outpatient Encounter Medications as of 02/24/2023  Medication Sig   acetaminophen (TYLENOL) 500 MG tablet Take 500 mg by mouth daily as needed for moderate pain or headache.   Cholecalciferol (VITAMIN D3) 2000 UNITS TABS Take 2,000 Units by mouth daily.   colchicine 0.6 MG tablet TAKE 1 TABLET DAILY AS NEEDED FOR GOUT FLARE UP.   ezetimibe (ZETIA) 10 MG tablet Take 1 tablet (10 mg total) by mouth daily.   GARLIC CHOLESTA HEALTH PO  Take by mouth.   hydrocortisone (ANUSOL-HC) 2.5 % rectal cream APPLY RECTALLY DAILY AS NEEDED FOR HEMMORHOIDS OR ITCHING.   levothyroxine (SYNTHROID) 88 MCG tablet TAKE 1 TABLET ONCE DAILY.   metoprolol succinate (TOPROL-XL) 50 MG 24 hr tablet Take 1 tablet (50 mg total) by mouth daily. take 1 tablet daily with or immediately following a meal.   nitroGLYCERIN (NITROSTAT) 0.4 MG SL tablet  PLACE 1 TAB UNDER TONGUE EVERY 3 MINUTES AS DIRECTED UP TO 3 TIMES AS NEEDED FOR CHEST PAIN.   pantoprazole (PROTONIX) 40 MG tablet Take 1 tablet (40 mg total) by mouth daily.   rivaroxaban (XARELTO) 20 MG TABS tablet Take 1 tablet (20 mg total) by mouth daily with supper.   Facility-Administered Encounter Medications as of 02/24/2023  Medication   cyanocobalamin (VITAMIN B12) injection 1,000 mcg    ALLERGIES:  Allergies  Allergen Reactions   Daypro [Oxaprozin]     Unknown reaction   Diclofenac Sodium     Unknown reaction   Effexor [Venlafaxine Hydrochloride]     Unknown reaction   Lidocaine-Prilocaine    Prozac [Fluoxetine Hcl]     Unknown reaction   Amitriptyline Rash   Cymbalta [Duloxetine Hcl] Nausea Only   Duloxetine Nausea Only   Lipitor [Atorvastatin Calcium] Other (See Comments)    Leg aches   Penicillins Rash    Has patient had a PCN reaction causing immediate rash, facial/tongue/throat swelling, SOB or lightheadedness with hypotension: Yes Has patient had a PCN reaction causing severe rash involving mucus membranes or skin necrosis: No Has patient had a PCN reaction that required hospitalization: No Has patient had a PCN reaction occurring within the last 10 years: No If all of the above answers are "NO", then may proceed with Cephalosporin use.    Savella [Milnacipran Hcl] Rash   Sulfa Antibiotics Rash   Zocor [Simvastatin] Other (See Comments)    Hip pain    LABORATORY DATA:  I have reviewed the labs as listed.  CBC    Component Value Date/Time   WBC 6.5 02/17/2023 1017   RBC 4.06 02/17/2023 1017   HGB 11.4 (L) 02/17/2023 1017   HGB 11.8 01/17/2023 1013   HCT 36.4 02/17/2023 1017   HCT 35.9 01/17/2023 1013   PLT 205 02/17/2023 1017   PLT 193 01/17/2023 1013   MCV 89.7 02/17/2023 1017   MCV 90 01/17/2023 1013   MCH 28.1 02/17/2023 1017   MCHC 31.3 02/17/2023 1017   RDW 12.4 02/17/2023 1017   RDW 12.1 01/17/2023 1013   LYMPHSABS 1.1 02/17/2023 1017    LYMPHSABS 1.0 01/17/2023 1013   MONOABS 0.6 02/17/2023 1017   EOSABS 0.1 02/17/2023 1017   EOSABS 0.1 01/17/2023 1013   BASOSABS 0.0 02/17/2023 1017   BASOSABS 0.0 01/17/2023 1013      Latest Ref Rng & Units 01/17/2023   10:13 AM 07/01/2022    1:59 PM 04/23/2022    1:52 PM  CMP  Glucose 70 - 99 mg/dL 161  096  045   BUN 8 - 27 mg/dL 10  13  9    Creatinine 0.57 - 1.00 mg/dL 4.09  8.11  9.14   Sodium 134 - 144 mmol/L 137  131  140   Potassium 3.5 - 5.2 mmol/L 4.5  4.1  4.3   Chloride 96 - 106 mmol/L 98  96  98   CO2 20 - 29 mmol/L 26  26  25    Calcium 8.7 - 10.3 mg/dL 9.1  8.5  8.8  Total Protein 6.0 - 8.5 g/dL 5.8  6.6  6.1   Total Bilirubin 0.0 - 1.2 mg/dL 0.7  0.7  0.7   Alkaline Phos 44 - 121 IU/L 91  86  98   AST 0 - 40 IU/L 14  17  14    ALT 0 - 32 IU/L 5  11  10      DIAGNOSTIC IMAGING:  I have independently reviewed the relevant imaging and discussed with the patient.   WRAP UP:  All questions were answered. The patient knows to call the clinic with any problems, questions or concerns.  Medical decision making: Low  Time spent on visit: I spent 20 minutes counseling the patient face to face. The total time spent in the appointment was 22 minutes and more than 50% was on counseling.  Mauro Kaufmann, NP  07/08/2022 2:19 PM

## 2023-03-07 ENCOUNTER — Ambulatory Visit (INDEPENDENT_AMBULATORY_CARE_PROVIDER_SITE_OTHER): Payer: Medicare HMO

## 2023-03-07 DIAGNOSIS — E538 Deficiency of other specified B group vitamins: Secondary | ICD-10-CM

## 2023-03-07 NOTE — Progress Notes (Signed)
Cyanocobalamin injection given to left deltoid.  Patient tolerated well. 

## 2023-03-11 ENCOUNTER — Inpatient Hospital Stay: Payer: Medicare HMO

## 2023-03-11 VITALS — BP 163/72 | HR 59 | Temp 97.3°F | Resp 18

## 2023-03-11 DIAGNOSIS — D509 Iron deficiency anemia, unspecified: Secondary | ICD-10-CM | POA: Diagnosis not present

## 2023-03-11 DIAGNOSIS — D500 Iron deficiency anemia secondary to blood loss (chronic): Secondary | ICD-10-CM

## 2023-03-11 MED ORDER — SODIUM CHLORIDE 0.9 % IV SOLN
300.0000 mg | Freq: Once | INTRAVENOUS | Status: AC
  Administered 2023-03-11: 300 mg via INTRAVENOUS
  Filled 2023-03-11: qty 300

## 2023-03-11 MED ORDER — LORATADINE 10 MG PO TABS: 10.0000 mg | ORAL_TABLET | Freq: Once | ORAL | Status: DC

## 2023-03-11 MED ORDER — CETIRIZINE HCL 10 MG PO TABS
10.0000 mg | ORAL_TABLET | Freq: Once | ORAL | Status: AC
  Administered 2023-03-11: 10 mg via ORAL
  Filled 2023-03-11: qty 1

## 2023-03-11 MED ORDER — SODIUM CHLORIDE 0.9 % IV SOLN: Freq: Once | INTRAVENOUS | Status: AC

## 2023-03-11 NOTE — Progress Notes (Signed)
Patient presents today for Venofer infusion per providers order.  Vital signs WNL.  Patient has no new complaints at this time.  Peripheral IV started and blood return noted pre and post infusion.  Stable during infusion without adverse affects.  Vital signs stable.  No complaints at this time.  Discharge from clinic ambulatory in stable condition.  Alert and oriented X 3.  Follow up with [facility] as scheduled.  

## 2023-03-11 NOTE — Progress Notes (Signed)
Pharmacy has substituted cetirizine 10 mg orally x 1 as premedication for   Loratidine discontinued.  V.O. Dr Rhys Martini, PharmD

## 2023-03-11 NOTE — Patient Instructions (Signed)
[facility]  Discharge Instructions: Thank you for choosing [facility] to provide your oncology and hematology care.  If you have a lab appointment with the Cancer Center - please note that after April 8th, 2024, all labs will be drawn in the cancer center.  You do not have to check in or register with the main entrance as you have in the past but will complete your check-in in the cancer center.  Wear comfortable clothing and clothing appropriate for easy access to any Portacath or PICC line.   We strive to give you quality time with your provider. You may need to reschedule your appointment if you arrive late (15 or more minutes).  Arriving late affects you and other patients whose appointments are after yours.  Also, if you miss three or more appointments without notifying the office, you may be dismissed from the clinic at the provider's discretion.      For prescription refill requests, have your pharmacy contact our office and allow 72 hours for refills to be completed.    Today you received the following chemotherapy and/or immunotherapy agents Venofer      To help prevent nausea and vomiting after your treatment, we encourage you to take your nausea medication as directed.  BELOW ARE SYMPTOMS THAT SHOULD BE REPORTED IMMEDIATELY: *FEVER GREATER THAN 100.4 F (38 C) OR HIGHER *CHILLS OR SWEATING *NAUSEA AND VOMITING THAT IS NOT CONTROLLED WITH YOUR NAUSEA MEDICATION *UNUSUAL SHORTNESS OF BREATH *UNUSUAL BRUISING OR BLEEDING *URINARY PROBLEMS (pain or burning when urinating, or frequent urination) *BOWEL PROBLEMS (unusual diarrhea, constipation, pain near the anus) TENDERNESS IN MOUTH AND THROAT WITH OR WITHOUT PRESENCE OF ULCERS (sore throat, sores in mouth, or a toothache) UNUSUAL RASH, SWELLING OR PAIN  UNUSUAL VAGINAL DISCHARGE OR ITCHING   Items with * indicate a potential emergency and should be followed up as soon as possible or go to the  Emergency Department if any problems should occur.  Please show the CHEMOTHERAPY ALERT CARD or IMMUNOTHERAPY ALERT CARD at check-in to the Emergency Department and triage nurse.  Should you have questions after your visit or need to cancel or reschedule your appointment, please contact [facility] [phone]  and follow the prompts.  Office hours are 8:00 a.m. to 4:30 p.m. Monday - Friday. Please note that voicemails left after 4:00 p.m. may not be returned until the following business day.  We are closed weekends and major holidays. You have access to a nurse at all times for urgent questions. Please call the main number to the clinic [phone] and follow the prompts.  For any non-urgent questions, you may also contact your provider using MyChart. We now offer e-Visits for anyone 18 and older to request care online for non-urgent symptoms. For details visit [email or web address].   Also download the MyChart app! Go to the app store, search "MyChart", open the app, select [facility], and log in with your MyChart username and password.   

## 2023-03-15 DIAGNOSIS — K219 Gastro-esophageal reflux disease without esophagitis: Secondary | ICD-10-CM | POA: Diagnosis not present

## 2023-03-15 DIAGNOSIS — R030 Elevated blood-pressure reading, without diagnosis of hypertension: Secondary | ICD-10-CM | POA: Diagnosis not present

## 2023-03-15 DIAGNOSIS — E785 Hyperlipidemia, unspecified: Secondary | ICD-10-CM | POA: Diagnosis not present

## 2023-03-15 DIAGNOSIS — E039 Hypothyroidism, unspecified: Secondary | ICD-10-CM | POA: Diagnosis not present

## 2023-03-15 DIAGNOSIS — E669 Obesity, unspecified: Secondary | ICD-10-CM | POA: Diagnosis not present

## 2023-03-15 DIAGNOSIS — N182 Chronic kidney disease, stage 2 (mild): Secondary | ICD-10-CM | POA: Diagnosis not present

## 2023-03-15 DIAGNOSIS — D6869 Other thrombophilia: Secondary | ICD-10-CM | POA: Diagnosis not present

## 2023-03-15 DIAGNOSIS — I2510 Atherosclerotic heart disease of native coronary artery without angina pectoris: Secondary | ICD-10-CM | POA: Diagnosis not present

## 2023-03-15 DIAGNOSIS — M8580 Other specified disorders of bone density and structure, unspecified site: Secondary | ICD-10-CM | POA: Diagnosis not present

## 2023-03-15 DIAGNOSIS — I4891 Unspecified atrial fibrillation: Secondary | ICD-10-CM | POA: Diagnosis not present

## 2023-03-15 DIAGNOSIS — M1990 Unspecified osteoarthritis, unspecified site: Secondary | ICD-10-CM | POA: Diagnosis not present

## 2023-03-15 DIAGNOSIS — M109 Gout, unspecified: Secondary | ICD-10-CM | POA: Diagnosis not present

## 2023-03-16 ENCOUNTER — Other Ambulatory Visit: Payer: Self-pay | Admitting: Family Medicine

## 2023-03-16 DIAGNOSIS — I480 Paroxysmal atrial fibrillation: Secondary | ICD-10-CM

## 2023-03-16 DIAGNOSIS — I10 Essential (primary) hypertension: Secondary | ICD-10-CM

## 2023-03-17 MED FILL — Iron Sucrose Inj 20 MG/ML (Fe Equiv): INTRAVENOUS | Qty: 15 | Status: AC

## 2023-03-18 ENCOUNTER — Inpatient Hospital Stay: Payer: Medicare HMO

## 2023-03-18 VITALS — BP 153/58 | HR 59 | Temp 96.0°F | Resp 18 | Wt 200.7 lb

## 2023-03-18 DIAGNOSIS — D500 Iron deficiency anemia secondary to blood loss (chronic): Secondary | ICD-10-CM

## 2023-03-18 DIAGNOSIS — D509 Iron deficiency anemia, unspecified: Secondary | ICD-10-CM | POA: Diagnosis not present

## 2023-03-18 MED ORDER — SODIUM CHLORIDE 0.9 % IV SOLN: Freq: Once | INTRAVENOUS | Status: AC

## 2023-03-18 MED ORDER — CETIRIZINE HCL 10 MG PO TABS: 10.0000 mg | ORAL_TABLET | Freq: Once | ORAL | Status: DC

## 2023-03-18 MED ORDER — LORATADINE 10 MG PO TABS: 10.0000 mg | ORAL_TABLET | Freq: Once | ORAL | Status: DC

## 2023-03-18 MED ORDER — ACETAMINOPHEN 325 MG PO TABS: 650.0000 mg | ORAL_TABLET | Freq: Once | ORAL | Status: DC

## 2023-03-18 MED ORDER — IRON SUCROSE 20 MG/ML IV SOLN
300.0000 mg | Freq: Once | INTRAVENOUS | Status: AC
  Administered 2023-03-18: 300 mg via INTRAVENOUS
  Filled 2023-03-18: qty 300

## 2023-03-18 NOTE — Progress Notes (Signed)
Patient tolerated iron infusion with no complaints voiced.  Peripheral IV site clean and dry with good blood return noted before and after infusion.  Band aid applied.  Pt observed for 30 minutes post venofer infusion. VSS with discharge and left in satisfactory condition with no s/s of distress noted.   Jenna [name]

## 2023-03-18 NOTE — Patient Instructions (Signed)
Iron Sucrose Injection What is this medication? IRON SUCROSE (EYE ern SOO krose) treats low levels of iron (iron deficiency anemia) in people with kidney disease. Iron is a mineral that plays an important role in making red blood cells, which carry oxygen from your lungs to the rest of your body. This medicine may be used for other purposes; ask your health care provider or pharmacist if you have questions. COMMON BRAND NAME(S): Venofer What should I tell my care team before I take this medication? They need to know if you have any of these conditions: Anemia not caused by low iron levels Heart disease High levels of iron in the blood Kidney disease Liver disease An unusual or allergic reaction to iron, other medications, foods, dyes, or preservatives Pregnant or trying to get pregnant Breastfeeding How should I use this medication? This medication is for infusion into a vein. It is given in a hospital or clinic setting. Talk to your care team about the use of this medication in children. While this medication may be prescribed for children as young as 2 years for selected conditions, precautions do apply. Overdosage: If you think you have taken too much of this medicine contact a poison control center or emergency room at once. NOTE: This medicine is only for you. Do not share this medicine with others. What if I miss a dose? Keep appointments for follow-up doses. It is important not to miss your dose. Call your care team if you are unable to keep an appointment. What may interact with this medication? Do not take this medication with any of the following: Deferoxamine Dimercaprol Other iron products This medication may also interact with the following: Chloramphenicol Deferasirox This list may not describe all possible interactions. Give your health care provider a list of all the medicines, herbs, non-prescription drugs, or dietary supplements you use. Also tell them if you smoke,  drink alcohol, or use illegal drugs. Some items may interact with your medicine. What should I watch for while using this medication? Visit your care team regularly. Tell your care team if your symptoms do not start to get better or if they get worse. You may need blood work done while you are taking this medication. You may need to follow a special diet. Talk to your care team. Foods that contain iron include: whole grains/cereals, dried fruits, beans, or peas, leafy green vegetables, and organ meats (liver, kidney). What side effects may I notice from receiving this medication? Side effects that you should report to your care team as soon as possible: Allergic reactions--skin rash, itching, hives, swelling of the face, lips, tongue, or throat Low blood pressure--dizziness, feeling faint or lightheaded, blurry vision Shortness of breath Side effects that usually do not require medical attention (report to your care team if they continue or are bothersome): Flushing Headache Joint pain Muscle pain Nausea Pain, redness, or irritation at injection site This list may not describe all possible side effects. Call your doctor for medical advice about side effects. You may report side effects to FDA at 1-800-FDA-1088. Where should I keep my medication? This medication is given in a hospital or clinic. It will not be stored at home. NOTE: This sheet is a summary. It may not cover all possible information. If you have questions about this medicine, talk to your doctor, pharmacist, or health care provider.  2024 Elsevier/Gold Standard (2022-10-15 00:00:00)

## 2023-03-25 ENCOUNTER — Inpatient Hospital Stay: Payer: Medicare HMO | Attending: Hematology

## 2023-03-25 VITALS — BP 184/56 | HR 61 | Temp 98.3°F | Resp 18

## 2023-03-25 DIAGNOSIS — D509 Iron deficiency anemia, unspecified: Secondary | ICD-10-CM | POA: Insufficient documentation

## 2023-03-25 DIAGNOSIS — D500 Iron deficiency anemia secondary to blood loss (chronic): Secondary | ICD-10-CM

## 2023-03-25 MED ORDER — SODIUM CHLORIDE 0.9 % IV SOLN
300.0000 mg | Freq: Once | INTRAVENOUS | Status: AC
  Administered 2023-03-25: 300 mg via INTRAVENOUS
  Filled 2023-03-25: qty 10

## 2023-03-25 MED ORDER — SODIUM CHLORIDE 0.9 % IV SOLN: Freq: Once | INTRAVENOUS | Status: AC

## 2023-03-25 NOTE — Progress Notes (Signed)
Patient presents today for Venofer 300  mg infusion. Patient took Tylenol 650 mgs and Claritin 10 mgs PO at 08:30 am. Vital signs stable. Patient denies any side effects related to last iron infusion.   Venofer given today per MD orders. Tolerated infusion without adverse affects. Vital signs stable. No complaints at this time. Discharged from clinic ambulatory in stable condition. Alert and oriented x 3. F/U with [facility] as scheduled.

## 2023-03-25 NOTE — Progress Notes (Signed)
Message sent to Dr. Anders Simmonds pertaining to elevated blood pressure. Patient asymptomatic and states she has an upcoming appointment with her PCP about her blood pressure. Orders received to discharge patient home and follow up with her PCP. Blood pressure on discharge 184/56. Patient teaching performed pertaining to s/s related to elevated blood pressure. Understanding verbalized.

## 2023-03-25 NOTE — Patient Instructions (Signed)
[facility]  Discharge Instructions: Thank you for choosing [facility] to provide your oncology and hematology care.  If you have a lab appointment with the Cancer Center - please note that after April 8th, 2024, all labs will be drawn in the cancer center.  You do not have to check in or register with the main entrance as you have in the past but will complete your check-in in the cancer center.  Wear comfortable clothing and clothing appropriate for easy access to any Portacath or PICC line.   We strive to give you quality time with your provider. You may need to reschedule your appointment if you arrive late (15 or more minutes).  Arriving late affects you and other patients whose appointments are after yours.  Also, if you miss three or more appointments without notifying the office, you may be dismissed from the clinic at the provider's discretion.      For prescription refill requests, have your pharmacy contact our office and allow 72 hours for refills to be completed.    Today you received the following chemotherapy and/or immunotherapy agents venofer. Iron Sucrose Injection What is this medication? IRON SUCROSE (EYE ern SOO krose) treats low levels of iron (iron deficiency anemia) in people with kidney disease. Iron is a mineral that plays an important role in making red blood cells, which carry oxygen from your lungs to the rest of your body. This medicine may be used for other purposes; ask your health care provider or pharmacist if you have questions. COMMON BRAND NAME(S): Venofer What should I tell my care team before I take this medication? They need to know if you have any of these conditions: Anemia not caused by low iron levels Heart disease High levels of iron in the blood Kidney disease Liver disease An unusual or allergic reaction to iron, other medications, foods, dyes, or preservatives Pregnant or trying to get  pregnant Breastfeeding How should I use this medication? This medication is for infusion into a vein. It is given in a hospital or clinic setting. Talk to your care team about the use of this medication in children. While this medication may be prescribed for children as young as 2 years for selected conditions, precautions do apply. Overdosage: If you think you have taken too much of this medicine contact a poison control center or emergency room at once. NOTE: This medicine is only for you. Do not share this medicine with others. What if I miss a dose? Keep appointments for follow-up doses. It is important not to miss your dose. Call your care team if you are unable to keep an appointment. What may interact with this medication? Do not take this medication with any of the following: Deferoxamine Dimercaprol Other iron products This medication may also interact with the following: Chloramphenicol Deferasirox This list may not describe all possible interactions. Give your health care provider a list of all the medicines, herbs, non-prescription drugs, or dietary supplements you use. Also tell them if you smoke, drink alcohol, or use illegal drugs. Some items may interact with your medicine. What should I watch for while using this medication? Visit your care team regularly. Tell your care team if your symptoms do not start to get better or if they get worse. You may need blood work done while you are taking this medication. You may need to follow a special diet. Talk to your care team. Foods that contain iron include: whole grains/cereals, dried  fruits, beans, or peas, leafy green vegetables, and organ meats (liver, kidney). What side effects may I notice from receiving this medication? Side effects that you should report to your care team as soon as possible: Allergic reactions--skin rash, itching, hives, swelling of the face, lips, tongue, or throat Low blood pressure--dizziness, feeling  faint or lightheaded, blurry vision Shortness of breath Side effects that usually do not require medical attention (report to your care team if they continue or are bothersome): Flushing Headache Joint pain Muscle pain Nausea Pain, redness, or irritation at injection site This list may not describe all possible side effects. Call your doctor for medical advice about side effects. You may report side effects to FDA at 1-800-FDA-1088. Where should I keep my medication? This medication is given in a hospital or clinic. It will not be stored at home. NOTE: This sheet is a summary. It may not cover all possible information. If you have questions about this medicine, talk to your doctor, pharmacist, or health care provider.  2024 Elsevier/Gold Standard (2022-10-15 00:00:00)       To help prevent nausea and vomiting after your treatment, we encourage you to take your nausea medication as directed.  BELOW ARE SYMPTOMS THAT SHOULD BE REPORTED IMMEDIATELY: *FEVER GREATER THAN 100.4 F (38 C) OR HIGHER *CHILLS OR SWEATING *NAUSEA AND VOMITING THAT IS NOT CONTROLLED WITH YOUR NAUSEA MEDICATION *UNUSUAL SHORTNESS OF BREATH *UNUSUAL BRUISING OR BLEEDING *URINARY PROBLEMS (pain or burning when urinating, or frequent urination) *BOWEL PROBLEMS (unusual diarrhea, constipation, pain near the anus) TENDERNESS IN MOUTH AND THROAT WITH OR WITHOUT PRESENCE OF ULCERS (sore throat, sores in mouth, or a toothache) UNUSUAL RASH, SWELLING OR PAIN  UNUSUAL VAGINAL DISCHARGE OR ITCHING   Items with * indicate a potential emergency and should be followed up as soon as possible or go to the Emergency Department if any problems should occur.  Please show the CHEMOTHERAPY ALERT CARD or IMMUNOTHERAPY ALERT CARD at check-in to the Emergency Department and triage nurse.  Should you have questions after your visit or need to cancel or reschedule your appointment, please contact [facility]  [phone]  and follow the prompts.  Office hours are 8:00 a.m. to 4:30 p.m. Monday - Friday. Please note that voicemails left after 4:00 p.m. may not be returned until the following business day.  We are closed weekends and major holidays. You have access to a nurse at all times for urgent questions. Please call the main number to the clinic [phone] and follow the prompts.  For any non-urgent questions, you may also contact your provider using MyChart. We now offer e-Visits for anyone 42 and older to request care online for non-urgent symptoms. For details visit [email or web address].   Also download the MyChart app! Go to the app store, search "MyChart", open the app, select [facility], and log in with your MyChart username and password.

## 2023-04-01 ENCOUNTER — Inpatient Hospital Stay: Payer: Medicare HMO

## 2023-04-01 VITALS — BP 168/72 | HR 62 | Temp 97.6°F | Resp 18

## 2023-04-01 DIAGNOSIS — D509 Iron deficiency anemia, unspecified: Secondary | ICD-10-CM | POA: Diagnosis not present

## 2023-04-01 DIAGNOSIS — D500 Iron deficiency anemia secondary to blood loss (chronic): Secondary | ICD-10-CM

## 2023-04-01 MED ORDER — SODIUM CHLORIDE 0.9 % IV SOLN
300.0000 mg | Freq: Once | INTRAVENOUS | Status: AC
  Administered 2023-04-01: 300 mg via INTRAVENOUS
  Filled 2023-04-01: qty 300

## 2023-04-01 MED ORDER — SODIUM CHLORIDE 0.9 % IV SOLN: Freq: Once | INTRAVENOUS | Status: AC

## 2023-04-01 MED ORDER — ACETAMINOPHEN 325 MG PO TABS: 650.0000 mg | ORAL_TABLET | Freq: Once | ORAL | Status: DC

## 2023-04-01 MED ORDER — LORATADINE 10 MG PO TABS
10.0000 mg | ORAL_TABLET | Freq: Once | ORAL | Status: DC
  Filled 2023-04-01: qty 1

## 2023-04-01 NOTE — Patient Instructions (Signed)
Iron Sucrose Injection What is this medication? IRON SUCROSE (EYE ern SOO krose) treats low levels of iron (iron deficiency anemia) in people with kidney disease. Iron is a mineral that plays an important role in making red blood cells, which carry oxygen from your lungs to the rest of your body. This medicine may be used for other purposes; ask your health care provider or pharmacist if you have questions. COMMON BRAND NAME(S): Venofer What should I tell my care team before I take this medication? They need to know if you have any of these conditions: Anemia not caused by low iron levels Heart disease High levels of iron in the blood Kidney disease Liver disease An unusual or allergic reaction to iron, other medications, foods, dyes, or preservatives Pregnant or trying to get pregnant Breastfeeding How should I use this medication? This medication is for infusion into a vein. It is given in a hospital or clinic setting. Talk to your care team about the use of this medication in children. While this medication may be prescribed for children as young as 2 years for selected conditions, precautions do apply. Overdosage: If you think you have taken too much of this medicine contact a poison control center or emergency room at once. NOTE: This medicine is only for you. Do not share this medicine with others. What if I miss a dose? Keep appointments for follow-up doses. It is important not to miss your dose. Call your care team if you are unable to keep an appointment. What may interact with this medication? Do not take this medication with any of the following: Deferoxamine Dimercaprol Other iron products This medication may also interact with the following: Chloramphenicol Deferasirox This list may not describe all possible interactions. Give your health care provider a list of all the medicines, herbs, non-prescription drugs, or dietary supplements you use. Also tell them if you smoke,  drink alcohol, or use illegal drugs. Some items may interact with your medicine. What should I watch for while using this medication? Visit your care team regularly. Tell your care team if your symptoms do not start to get better or if they get worse. You may need blood work done while you are taking this medication. You may need to follow a special diet. Talk to your care team. Foods that contain iron include: whole grains/cereals, dried fruits, beans, or peas, leafy green vegetables, and organ meats (liver, kidney). What side effects may I notice from receiving this medication? Side effects that you should report to your care team as soon as possible: Allergic reactions--skin rash, itching, hives, swelling of the face, lips, tongue, or throat Low blood pressure--dizziness, feeling faint or lightheaded, blurry vision Shortness of breath Side effects that usually do not require medical attention (report to your care team if they continue or are bothersome): Flushing Headache Joint pain Muscle pain Nausea Pain, redness, or irritation at injection site This list may not describe all possible side effects. Call your doctor for medical advice about side effects. You may report side effects to FDA at 1-800-FDA-1088. Where should I keep my medication? This medication is given in a hospital or clinic. It will not be stored at home. NOTE: This sheet is a summary. It may not cover all possible information. If you have questions about this medicine, talk to your doctor, pharmacist, or health care provider.  2024 Elsevier/Gold Standard (2022-10-15 00:00:00)

## 2023-04-01 NOTE — Progress Notes (Signed)
Patient tolerated iron infusion with no complaints voiced.  Peripheral IV site clean and dry with good blood return noted before and after infusion.  Band aid applied. Pt observed 30 minutes post Venofer infusion without any complications. VSS with discharge and left in satisfactory condition with no s/s of distress noted. All follow ups as scheduled.   Sherea Liptak [employer]

## 2023-04-07 ENCOUNTER — Ambulatory Visit: Payer: Medicare HMO

## 2023-04-11 ENCOUNTER — Ambulatory Visit: Payer: Medicare HMO

## 2023-04-13 ENCOUNTER — Ambulatory Visit (INDEPENDENT_AMBULATORY_CARE_PROVIDER_SITE_OTHER): Payer: Medicare HMO

## 2023-04-13 ENCOUNTER — Ambulatory Visit: Payer: Medicare HMO | Admitting: Family Medicine

## 2023-04-13 DIAGNOSIS — E538 Deficiency of other specified B group vitamins: Secondary | ICD-10-CM

## 2023-04-13 NOTE — Progress Notes (Signed)
B12 given in right deltoid without difficulty. Pt is scheduled for next injection on 12/18

## 2023-04-25 ENCOUNTER — Other Ambulatory Visit: Payer: Self-pay | Admitting: Family Medicine

## 2023-04-27 ENCOUNTER — Ambulatory Visit (INDEPENDENT_AMBULATORY_CARE_PROVIDER_SITE_OTHER): Payer: Medicare HMO | Admitting: Family Medicine

## 2023-04-27 ENCOUNTER — Encounter: Payer: Self-pay | Admitting: Family Medicine

## 2023-04-27 VITALS — BP 159/76 | HR 63 | Ht 66.0 in | Wt 198.0 lb

## 2023-04-27 DIAGNOSIS — I10 Essential (primary) hypertension: Secondary | ICD-10-CM

## 2023-04-27 DIAGNOSIS — E039 Hypothyroidism, unspecified: Secondary | ICD-10-CM

## 2023-04-27 DIAGNOSIS — E782 Mixed hyperlipidemia: Secondary | ICD-10-CM | POA: Diagnosis not present

## 2023-04-27 DIAGNOSIS — E1169 Type 2 diabetes mellitus with other specified complication: Secondary | ICD-10-CM

## 2023-04-27 LAB — BAYER DCA HB A1C WAIVED: HB A1C (BAYER DCA - WAIVED): 6.3 % — ABNORMAL HIGH (ref 4.8–5.6)

## 2023-04-27 NOTE — Progress Notes (Signed)
BP (!) 159/76   Pulse 63   Ht 5\' 6"  (1.676 m)   Wt 198 lb (89.8 kg)   SpO2 94%   BMI 31.96 kg/m    Subjective:   Patient ID: Jenna [name], female    DOB: January 21, 1936, 87 y.o.   MRN: [id number]  HPI: Jenna [name] is a 87 y.o. female presenting on 04/27/2023 for Medical Management of Chronic Issues, Diabetes, Hypertension, and Hypothyroidism   HPI Type 2 diabetes mellitus Patient comes in today for recheck of his diabetes. Patient has been currently taking no medicine, diet control. Patient is not currently on an ACE inhibitor/ARB. Patient has seen an ophthalmologist this year. Patient denies any new issues with their feet. The symptom started onset as an adult hypothyroidism and hyperlipidemia ARE RELATED TO DM   Hypothyroidism recheck Patient is coming in for thyroid recheck today as well. They deny any issues with hair changes or heat or cold problems or diarrhea or constipation. They deny any chest pain or palpitations. They are currently on levothyroxine 88 micrograms   Hyperlipidemia Patient is coming in for recheck of his hyperlipidemia. The patient is currently taking Zetia. They deny any issues with myalgias or history of liver damage from it. They deny any focal numbness or weakness or chest pain.   Hypertension Patient is currently on metoprolol, and their blood pressure today is 159/76, she says is running a little higher over the past few days because Thanksgiving was just last weekend she had a lot of salty meat such as ham and had biscuits and gravy in the mornings and she says it was running in the 130s 140s before then at home and now it is in the 150s recently but she is can get back on her diet and improve that.. Patient denies any lightheadedness or dizziness. Patient denies headaches, blurred vision, chest pains, shortness of breath, or weakness. Denies any side effects from medication and is content with current medication.   Relevant past medical, surgical,  family and social history reviewed and updated as indicated. Interim medical history since our last visit reviewed. Allergies and medications reviewed and updated.  Review of Systems  Constitutional:  Negative for chills and fever.  HENT:  Negative for congestion, ear discharge and ear pain.   Eyes:  Negative for redness and visual disturbance.  Respiratory:  Negative for chest tightness and shortness of breath.   Cardiovascular:  Negative for chest pain and leg swelling.  Genitourinary:  Negative for difficulty urinating and dysuria.  Musculoskeletal:  Negative for back pain and gait problem.  Skin:  Negative for rash.  Neurological:  Negative for dizziness, light-headedness and headaches.  Psychiatric/Behavioral:  Negative for agitation and behavioral problems.   All other systems reviewed and are negative.   Per HPI unless specifically indicated above   Allergies as of 04/27/2023       Reactions   Daypro [oxaprozin]    Unknown reaction   Diclofenac Sodium    Unknown reaction   Effexor [venlafaxine Hydrochloride]    Unknown reaction   Lidocaine-prilocaine    Prozac [fluoxetine Hcl]    Unknown reaction   Amitriptyline Rash   Cymbalta [duloxetine Hcl] Nausea Only   Duloxetine Nausea Only   Lipitor [atorvastatin Calcium] Other (See Comments)   Leg aches   Penicillins Rash   Has patient had a PCN reaction causing immediate rash, facial/tongue/throat swelling, SOB or lightheadedness with hypotension: Yes Has patient had a PCN reaction causing severe rash involving  mucus membranes or skin necrosis: No Has patient had a PCN reaction that required hospitalization: No Has patient had a PCN reaction occurring within the last 10 years: No If all of the above answers are "NO", then may proceed with Cephalosporin use.   Savella [milnacipran Hcl] Rash   Sulfa Antibiotics Rash   Zocor [simvastatin] Other (See Comments)   Hip pain        Medication List        Accurate as of  April 27, 2023 10:58 AM. If you have any questions, ask your nurse or doctor.          acetaminophen 500 MG tablet Commonly known as: TYLENOL Take 500 mg by mouth daily as needed for moderate pain or headache.   colchicine 0.6 MG tablet TAKE 1 TABLET DAILY AS NEEDED FOR GOUT FLARE UP.   ezetimibe 10 MG tablet Commonly known as: Zetia Take 1 tablet (10 mg total) by mouth daily.   GARLIC CHOLESTA HEALTH PO Take by mouth.   hydrocortisone 2.5 % rectal cream Commonly known as: ANUSOL-HC apply rectally daily as needed for hemmorhoids or itching.   levothyroxine 88 MCG tablet Commonly known as: SYNTHROID TAKE 1 TABLET ONCE DAILY.   metoprolol succinate 50 MG 24 hr tablet Commonly known as: TOPROL-XL Take 1 tablet (50 mg total) by mouth daily. take 1 tablet daily with or immediately following a meal.   nitroGLYCERIN 0.4 MG SL tablet Commonly known as: NITROSTAT PLACE 1 TAB UNDER TONGUE EVERY 3 MINUTES AS DIRECTED UP TO 3 TIMES AS NEEDED FOR CHEST PAIN.   pantoprazole 40 MG tablet Commonly known as: PROTONIX Take 1 tablet (40 mg total) by mouth daily.   rivaroxaban 20 MG Tabs tablet Commonly known as: Xarelto Take 1 tablet (20 mg total) by mouth daily with supper.   Vitamin D3 50 MCG (2000 UT) Tabs Take 2,000 Units by mouth daily.         Objective:   BP (!) 159/76   Pulse 63   Ht 5\' 6"  (1.676 m)   Wt 198 lb (89.8 kg)   SpO2 94%   BMI 31.96 kg/m   Wt Readings from Last 3 Encounters:  04/27/23 198 lb (89.8 kg)  03/18/23 200 lb 11.2 oz (91 kg)  02/24/23 200 lb 9.9 oz (91 kg)    Physical Exam Vitals and nursing note reviewed.  Constitutional:      General: She is not in acute distress.    Appearance: She is well-developed. She is not diaphoretic.  Eyes:     Conjunctiva/sclera: Conjunctivae normal.  Cardiovascular:     Rate and Rhythm: Normal rate and regular rhythm.     Heart sounds: Normal heart sounds. No murmur heard. Pulmonary:     Effort:  Pulmonary effort is normal. No respiratory distress.     Breath sounds: Normal breath sounds. No wheezing.  Musculoskeletal:        General: Swelling (2+ edema bilateral lower extremities) present. No tenderness. Normal range of motion.  Skin:    General: Skin is warm and dry.     Findings: No rash.  Neurological:     Mental Status: She is alert and oriented to person, place, and time.     Coordination: Coordination normal.  Psychiatric:        Behavior: Behavior normal.       Assessment & Plan:   Problem List Items Addressed This Visit       Cardiovascular and Mediastinum   Hypertension  Relevant Orders   CBC with Differential/Platelet   CMP14+EGFR   Lipid panel   TSH   Bayer DCA Hb A1c Waived     Endocrine   Hypothyroidism   Relevant Orders   CBC with Differential/Platelet   CMP14+EGFR   Lipid panel   TSH   Bayer DCA Hb A1c Waived   Type 2 diabetes mellitus with other specified complication (HCC) - Primary   Relevant Orders   CBC with Differential/Platelet   CMP14+EGFR   Lipid panel   TSH   Bayer DCA Hb A1c Waived     Other   Hyperlipidemia   Relevant Orders   CBC with Differential/Platelet   CMP14+EGFR   Lipid panel   TSH   Bayer DCA Hb A1c Waived    Her edema is chronic.  A1c was 6.3 which was good.  Blood pressure elevated and she is can keep a close eye on it.  She thinks it is just because of holiday eating last weekend. Follow up plan: Return in about 3 months (around 07/26/2023), or if symptoms worsen or fail to improve, for Diabetes recheck.  Counseling provided for all of the vaccine components Orders Placed This Encounter  Procedures   CBC with Differential/Platelet   CMP14+EGFR   Lipid panel   TSH   Bayer DCA Hb A1c Waived    Arville Care, MD [facility] 04/27/2023, 10:58 AM

## 2023-04-28 LAB — CBC WITH DIFFERENTIAL/PLATELET
Basophils Absolute: 0 10*3/uL (ref 0.0–0.2)
Basos: 1 %
EOS (ABSOLUTE): 0.1 10*3/uL (ref 0.0–0.4)
Eos: 2 %
Hematocrit: 40.6 % (ref 34.0–46.6)
Hemoglobin: 13.5 g/dL (ref 11.1–15.9)
Immature Grans (Abs): 0 10*3/uL (ref 0.0–0.1)
Immature Granulocytes: 0 %
Lymphocytes Absolute: 1 10*3/uL (ref 0.7–3.1)
Lymphs: 19 %
MCH: 30.7 pg (ref 26.6–33.0)
MCHC: 33.3 g/dL (ref 31.5–35.7)
MCV: 92 fL (ref 79–97)
Monocytes Absolute: 0.5 10*3/uL (ref 0.1–0.9)
Monocytes: 9 %
Neutrophils Absolute: 3.7 10*3/uL (ref 1.4–7.0)
Neutrophils: 69 %
Platelets: 161 10*3/uL (ref 150–450)
RBC: 4.4 x10E6/uL (ref 3.77–5.28)
RDW: 16.2 % — ABNORMAL HIGH (ref 11.7–15.4)
WBC: 5.3 10*3/uL (ref 3.4–10.8)

## 2023-04-28 LAB — CMP14+EGFR
ALT: 9 [IU]/L (ref 0–32)
AST: 16 [IU]/L (ref 0–40)
Albumin: 4.2 g/dL (ref 3.7–4.7)
Alkaline Phosphatase: 95 [IU]/L (ref 44–121)
BUN/Creatinine Ratio: 16 (ref 12–28)
BUN: 11 mg/dL (ref 8–27)
Bilirubin Total: 0.7 mg/dL (ref 0.0–1.2)
CO2: 27 mmol/L (ref 20–29)
Calcium: 9.2 mg/dL (ref 8.7–10.3)
Chloride: 97 mmol/L (ref 96–106)
Creatinine, Ser: 0.67 mg/dL (ref 0.57–1.00)
Globulin, Total: 2 g/dL (ref 1.5–4.5)
Glucose: 123 mg/dL — ABNORMAL HIGH (ref 70–99)
Potassium: 4.4 mmol/L (ref 3.5–5.2)
Sodium: 137 mmol/L (ref 134–144)
Total Protein: 6.2 g/dL (ref 6.0–8.5)
eGFR: 86 mL/min/{1.73_m2} (ref >59)

## 2023-04-28 LAB — LIPID PANEL
Cholesterol, Total: 209 mg/dL — ABNORMAL HIGH (ref 100–199)
HDL: 61 mg/dL (ref >39)
LDL CALC COMMENT:: 3.4 ratio (ref 0.0–4.4)
LDL Chol Calc (NIH): 121 mg/dL — ABNORMAL HIGH (ref 0–99)
Triglycerides: 152 mg/dL — ABNORMAL HIGH (ref 0–149)
VLDL Cholesterol Cal: 27 mg/dL (ref 5–40)

## 2023-04-28 LAB — TSH: TSH: 2.95 u[IU]/mL (ref 0.450–4.500)

## 2023-04-29 ENCOUNTER — Other Ambulatory Visit: Payer: Self-pay | Admitting: Family Medicine

## 2023-04-29 DIAGNOSIS — K219 Gastro-esophageal reflux disease without esophagitis: Secondary | ICD-10-CM

## 2023-05-11 ENCOUNTER — Ambulatory Visit (INDEPENDENT_AMBULATORY_CARE_PROVIDER_SITE_OTHER): Payer: Medicare HMO

## 2023-05-11 DIAGNOSIS — E538 Deficiency of other specified B group vitamins: Secondary | ICD-10-CM | POA: Diagnosis not present

## 2023-05-11 NOTE — Progress Notes (Signed)
Patient is in office today for a nurse visit for B12 Injection. Patient Injection was given in the  Left deltoid. Patient tolerated injection well.

## 2023-06-13 ENCOUNTER — Other Ambulatory Visit: Payer: Self-pay

## 2023-06-13 ENCOUNTER — Ambulatory Visit: Payer: Medicare HMO

## 2023-06-13 ENCOUNTER — Ambulatory Visit: Payer: Medicare HMO | Admitting: Family Medicine

## 2023-06-13 ENCOUNTER — Encounter: Payer: Self-pay | Admitting: Family Medicine

## 2023-06-13 VITALS — BP 150/63 | HR 61 | Ht 66.0 in | Wt 199.0 lb

## 2023-06-13 DIAGNOSIS — E538 Deficiency of other specified B group vitamins: Secondary | ICD-10-CM

## 2023-06-13 DIAGNOSIS — H9202 Otalgia, left ear: Secondary | ICD-10-CM | POA: Diagnosis not present

## 2023-06-13 DIAGNOSIS — M26622 Arthralgia of left temporomandibular joint: Secondary | ICD-10-CM

## 2023-06-13 MED ORDER — PREDNISONE 20 MG PO TABS: ORAL_TABLET | ORAL | 0 refills | Status: DC

## 2023-06-13 NOTE — Progress Notes (Signed)
BP (!) 150/63   Pulse 61   Ht 5\' 6"  (1.676 m)   Wt 90.3 kg   SpO2 95%   BMI 32.12 kg/m    Subjective:   Patient ID: Jenna [name], female    DOB: 09/19/1935, 88 y.o.   MRN: [id number]  HPI: Jenna [name] is a 88 y.o. female presenting on 06/13/2023 for Ear Pain (Left. Pain radiates up scalp. )   HPI Pain started 4 days ago in her left ear and radiates to the left side of her face. She describes a sharp achy pain that last for hours. Usually is worse in the evenings and at night.  She denies any fevers or chills.  She denies any hearing changes.  She does have a little bit of sinus congestion.  Tylenol has provided some relief but symptoms come back. Her hearing and vision are fine. Denies recent illness, fever, headache, trauma, discharge from ear, or dental issues.  Relevant past medical, surgical, family and social history reviewed and updated as indicated. Interim medical history since our last visit reviewed. Allergies and medications reviewed and updated.  Review of Systems  Constitutional:  Negative for chills and fever.  HENT:  Positive for ear pain. Negative for congestion, hearing loss and sinus pain.   Eyes:  Negative for pain.  Respiratory:  Negative for cough and shortness of breath.   Cardiovascular:  Negative for chest pain and leg swelling.  Musculoskeletal:  Negative for myalgias.  Skin:  Negative for rash.  Neurological:  Negative for light-headedness, numbness and headaches.       Positive left sided facial pain  All other systems reviewed and are negative.   Per HPI unless specifically indicated above   Allergies as of 06/13/2023       Reactions   Daypro [oxaprozin]    Unknown reaction   Diclofenac Sodium    Unknown reaction   Effexor [venlafaxine Hydrochloride]    Unknown reaction   Lidocaine-prilocaine    Prozac [fluoxetine Hcl]    Unknown reaction   Amitriptyline Rash   Cymbalta [duloxetine Hcl] Nausea Only   Duloxetine Nausea Only    Lipitor [atorvastatin Calcium] Other (See Comments)   Leg aches   Penicillins Rash   Has patient had a PCN reaction causing immediate rash, facial/tongue/throat swelling, SOB or lightheadedness with hypotension: Yes Has patient had a PCN reaction causing severe rash involving mucus membranes or skin necrosis: No Has patient had a PCN reaction that required hospitalization: No Has patient had a PCN reaction occurring within the last 10 years: No If all of the above answers are "NO", then may proceed with Cephalosporin use.   Savella [milnacipran Hcl] Rash   Sulfa Antibiotics Rash   Zocor [simvastatin] Other (See Comments)   Hip pain        Medication List        Accurate as of June 13, 2023  2:25 PM. If you have any questions, ask your nurse or doctor.          acetaminophen 500 MG tablet Commonly known as: TYLENOL Take 500 mg by mouth daily as needed for moderate pain or headache.   colchicine 0.6 MG tablet TAKE 1 TABLET DAILY AS NEEDED FOR GOUT FLARE UP.   ezetimibe 10 MG tablet Commonly known as: Zetia Take 1 tablet (10 mg total) by mouth daily.   GARLIC CHOLESTA HEALTH PO Take by mouth.   hydrocortisone 2.5 % rectal cream Commonly known as: ANUSOL-HC apply rectally  daily as needed for hemmorhoids or itching.   levothyroxine 88 MCG tablet Commonly known as: SYNTHROID TAKE 1 TABLET ONCE DAILY.   metoprolol succinate 50 MG 24 hr tablet Commonly known as: TOPROL-XL Take 1 tablet (50 mg total) by mouth daily. take 1 tablet daily with or immediately following a meal.   nitroGLYCERIN 0.4 MG SL tablet Commonly known as: NITROSTAT PLACE 1 TAB UNDER TONGUE EVERY 3 MINUTES AS DIRECTED UP TO 3 TIMES AS NEEDED FOR CHEST PAIN.   pantoprazole 40 MG tablet Commonly known as: PROTONIX Take 1 tablet (40 mg total) by mouth daily.   predniSONE 20 MG tablet Commonly known as: DELTASONE 2 po at same time daily for 5 days Started by: Jenna [name]   rivaroxaban 20  MG Tabs tablet Commonly known as: Xarelto Take 1 tablet (20 mg total) by mouth daily with supper.   Vitamin D3 50 MCG (2000 UT) Tabs Take 2,000 Units by mouth daily.         Objective:   BP (!) 150/63   Pulse 61   Ht 5\' 6"  (1.676 m)   Wt 90.3 kg   SpO2 95%   BMI 32.12 kg/m   Wt Readings from Last 3 Encounters:  06/13/23 90.3 kg  04/27/23 89.8 kg  03/18/23 91 kg    Physical Exam Vitals reviewed.  Constitutional:      General: She is not in acute distress.    Appearance: Normal appearance. She is well-developed. She is not diaphoretic.  HENT:     Head:     Jaw: Tenderness and pain on movement present. No trismus or swelling.     Left Ear: Hearing, tympanic membrane, ear canal and external ear normal. No decreased hearing noted. No drainage or tenderness.     Mouth/Throat:     Mouth: Mucous membranes are moist.     Pharynx: No pharyngeal swelling.     Tonsils: No tonsillar exudate.  Eyes:     Conjunctiva/sclera: Conjunctivae normal.  Cardiovascular:     Rate and Rhythm: Normal rate and regular rhythm.     Heart sounds: Normal heart sounds. No murmur heard. Pulmonary:     Effort: Pulmonary effort is normal. No respiratory distress.     Breath sounds: Normal breath sounds. No wheezing.  Musculoskeletal:     Comments: Positive pain and tenderness with Left temporomandibular joint. Negative for crepitus.  Lymphadenopathy:     Cervical: No cervical adenopathy.  Skin:    General: Skin is warm and dry.  Neurological:     Mental Status: She is alert and oriented to person, place, and time.     Sensory: Sensation is intact.     Comments: Negative for CN 5 or 7 deficit.  Psychiatric:        Behavior: Behavior normal.       Assessment & Plan:   Problem List Items Addressed This Visit   None Visit Diagnoses       Arthralgia of left temporomandibular joint    -  Primary   Relevant Medications   predniSONE (DELTASONE) 20 MG tablet     Otalgia of left ear        Relevant Medications   predniSONE (DELTASONE) 20 MG tablet       Diagnosis is left temporomandibular joint arthralgia along with otoglia of left ear. Will give Prednisone 40mg  daily. Follow up plan: Return if symptoms worsen or fail to improve.  Counseling provided for all of the vaccine components No orders  of the defined types were placed in this encounter.   Maximino Sarin PA-S 06/13/2023, 2:25 PM  I was personally present for all components of the history, physical exam and/or medical decision making.  I agree with the documentation performed by the student and agree with assessment and plan above.  Based on exam, most likely fits TMJ, sent short course of prednisone for her for Jenna Care, MD [facility] 06/22/2023, 1:11 PM

## 2023-07-06 ENCOUNTER — Other Ambulatory Visit: Payer: Self-pay

## 2023-07-06 DIAGNOSIS — E538 Deficiency of other specified B group vitamins: Secondary | ICD-10-CM

## 2023-07-06 DIAGNOSIS — D500 Iron deficiency anemia secondary to blood loss (chronic): Secondary | ICD-10-CM

## 2023-07-07 ENCOUNTER — Inpatient Hospital Stay: Payer: Medicare HMO | Attending: Hematology

## 2023-07-07 DIAGNOSIS — D509 Iron deficiency anemia, unspecified: Secondary | ICD-10-CM | POA: Diagnosis not present

## 2023-07-07 DIAGNOSIS — E538 Deficiency of other specified B group vitamins: Secondary | ICD-10-CM | POA: Diagnosis not present

## 2023-07-07 DIAGNOSIS — D500 Iron deficiency anemia secondary to blood loss (chronic): Secondary | ICD-10-CM

## 2023-07-07 LAB — CBC WITH DIFFERENTIAL/PLATELET
Abs Immature Granulocytes: 0.02 10*3/uL (ref 0.00–0.07)
Basophils Absolute: 0 10*3/uL (ref 0.0–0.1)
Basophils Relative: 1 %
Eosinophils Absolute: 0.1 10*3/uL (ref 0.0–0.5)
Eosinophils Relative: 2 %
HCT: 40.2 % (ref 36.0–46.0)
Hemoglobin: 13.2 g/dL (ref 12.0–15.0)
Immature Granulocytes: 0 %
Lymphocytes Relative: 20 %
Lymphs Abs: 1.1 10*3/uL (ref 0.7–4.0)
MCH: 31.3 pg (ref 26.0–34.0)
MCHC: 32.8 g/dL (ref 30.0–36.0)
MCV: 95.3 fL (ref 80.0–100.0)
Monocytes Absolute: 0.5 10*3/uL (ref 0.1–1.0)
Monocytes Relative: 9 %
Neutro Abs: 3.9 10*3/uL (ref 1.7–7.7)
Neutrophils Relative %: 68 %
Platelets: 175 10*3/uL (ref 150–400)
RBC: 4.22 MIL/uL (ref 3.87–5.11)
RDW: 13.2 % (ref 11.5–15.5)
WBC: 5.7 10*3/uL (ref 4.0–10.5)
nRBC: 0 % (ref 0.0–0.2)

## 2023-07-07 LAB — IRON AND TIBC
Iron: 97 ug/dL (ref 28–170)
Saturation Ratios: 31 % (ref 10.4–31.8)
TIBC: 314 ug/dL (ref 250–450)
UIBC: 217 ug/dL

## 2023-07-07 LAB — FERRITIN: Ferritin: 99 ng/mL (ref 11–307)

## 2023-07-07 LAB — VITAMIN B12: Vitamin B-12: 405 pg/mL (ref 180–914)

## 2023-07-11 LAB — METHYLMALONIC ACID, SERUM: Methylmalonic Acid, Quantitative: 137 nmol/L (ref 0–378)

## 2023-07-14 ENCOUNTER — Ambulatory Visit: Payer: Medicare HMO

## 2023-07-14 ENCOUNTER — Inpatient Hospital Stay (HOSPITAL_BASED_OUTPATIENT_CLINIC_OR_DEPARTMENT_OTHER): Payer: Medicare HMO | Admitting: Oncology

## 2023-07-14 DIAGNOSIS — D500 Iron deficiency anemia secondary to blood loss (chronic): Secondary | ICD-10-CM | POA: Diagnosis not present

## 2023-07-14 DIAGNOSIS — E538 Deficiency of other specified B group vitamins: Secondary | ICD-10-CM

## 2023-07-14 NOTE — Progress Notes (Signed)
Virtual Visit via Telephone Note  I connected with Jenna [name] on 07/14/23 at 11:00 AM EST by telephone and verified that I am speaking with the correct person using two identifiers.  Location: Patient: Home  Provider: Home office   I discussed the limitations, risks, security and privacy concerns of performing an evaluation and management service by telephone and the availability of in person appointments. I also discussed with the patient that there may be a patient responsible charge related to this service. The patient expressed understanding and agreed to proceed.   History of Present Illness: Jenna [name] is an 88 year old female who is followed by hematology for iron deficiency anemia.  She was last seen in clinic on 02/24/2023 by me.  She is currently receiving monthly B12 injections here at our clinic.  She received 4 doses of 300 mg IV Venofer between 03/11/2023 and 04/01/2023.  Reports tolerating infusions well. Slight improvement in energy levels.  Reports occasional hemorrhoidal bleeding when she is constipated.  Since her last visit, she denies any hospitalizations, surgeries or changes to her baseline health. Unable to tolerate oral iron secondary to severe constipation.  Observations/Objective:Review of Systems  Constitutional:  Positive for malaise/fatigue.  Gastrointestinal:  Positive for constipation (occasional hemmordial bleeding.). Negative for blood in stool, diarrhea, melena, nausea and vomiting.  Genitourinary:  Negative for hematuria.     Physical Exam Neurological:     Mental Status: She is alert and oriented to person, place, and time.     Assessment and Plan: 1. Iron deficiency anemia due to chronic blood loss (Primary) -Has been receiving intermittent IV iron for several years. -She received 4 doses of 300 mg IV Venofer on 03/11/2023, 03/18/2023, 03/25/2023 and 04/01/2023 with good tolerance. -Repeat lab work from 07/07/2023 shows a hemoglobin of 13.2,  hematocrit 40.2 with MCV 95.3.  Differential is unremarkable.  Iron saturation is 31%, TIBC 314 and ferritin is 99. -She does not need any additional IV iron at this time. - Return to clinic in 4 months with labs a few days before.  2. B12 deficiency -Has been receiving monthly B12 injections. -Last B12 injection was on 07/07/2023. -Labs from 07/07/2023 show a vitamin B12 level of 405 with an MMA of 137. -Recommend she continue B12 injections monthly. -Due to weather, she was unable to come for B12 injection today, recommend B12 injection ASAP and continue monthly until we see her back.  Follow Up Instructions: Return to clinic in 4 months with labs a few days before.  Continue monthly B12 injections.   I discussed the assessment and treatment plan with the patient. The patient was provided an opportunity to ask questions and all were answered. The patient agreed with the plan and demonstrated an understanding of the instructions.   The patient was advised to call back or seek an in-person evaluation if the symptoms worsen or if the condition fails to improve as anticipated.  I provided 18 minutes of non-face-to-face time during this encounter.   Mauro Kaufmann, NP

## 2023-07-15 ENCOUNTER — Inpatient Hospital Stay: Payer: Medicare HMO

## 2023-07-15 ENCOUNTER — Ambulatory Visit (INDEPENDENT_AMBULATORY_CARE_PROVIDER_SITE_OTHER): Payer: Medicare HMO | Admitting: *Deleted

## 2023-07-15 DIAGNOSIS — E538 Deficiency of other specified B group vitamins: Secondary | ICD-10-CM | POA: Diagnosis not present

## 2023-07-15 NOTE — Progress Notes (Signed)
 Patient is in office today for a nurse visit for B12 Injection. Patient Injection was given in the  Left deltoid. Patient tolerated injection well.

## 2023-07-26 ENCOUNTER — Ambulatory Visit: Payer: Medicare HMO

## 2023-07-28 ENCOUNTER — Encounter: Payer: Self-pay | Admitting: Family Medicine

## 2023-07-28 ENCOUNTER — Ambulatory Visit: Payer: Medicare HMO | Admitting: Family Medicine

## 2023-07-28 VITALS — BP 151/63 | HR 66 | Ht 66.0 in | Wt 197.0 lb

## 2023-07-28 DIAGNOSIS — E1169 Type 2 diabetes mellitus with other specified complication: Secondary | ICD-10-CM

## 2023-07-28 DIAGNOSIS — E039 Hypothyroidism, unspecified: Secondary | ICD-10-CM | POA: Diagnosis not present

## 2023-07-28 DIAGNOSIS — E782 Mixed hyperlipidemia: Secondary | ICD-10-CM | POA: Diagnosis not present

## 2023-07-28 DIAGNOSIS — I10 Essential (primary) hypertension: Secondary | ICD-10-CM | POA: Diagnosis not present

## 2023-07-28 LAB — BAYER DCA HB A1C WAIVED: HB A1C (BAYER DCA - WAIVED): 6.9 % — ABNORMAL HIGH (ref 4.8–5.6)

## 2023-07-28 MED ORDER — LEVOTHYROXINE SODIUM 88 MCG PO TABS: 88.0000 ug | ORAL_TABLET | Freq: Every day | ORAL | 1 refills | Status: DC

## 2023-07-28 MED ORDER — EZETIMIBE 10 MG PO TABS: 10.0000 mg | ORAL_TABLET | Freq: Every day | ORAL | 3 refills | Status: DC

## 2023-07-28 NOTE — Progress Notes (Signed)
 BP (!) 151/63   Pulse 66   Ht 5\' 6"  (1.676 m)   Wt 197 lb (89.4 kg)   SpO2 93%   BMI 31.80 kg/m    Subjective:   Patient ID: Jenna [name], female    DOB: Jul 02, 1935, 88 y.o.   MRN: [id number]  HPI: Jenna [name] is a 88 y.o. female presenting on 07/28/2023 for Medical Management of Chronic Issues, Diabetes, and Hypothyroidism   HPI Type 2 diabetes mellitus Patient comes in today for recheck of his diabetes. Patient has been currently taking no medicine, diet control. Patient is not currently on an ACE inhibitor/ARB. Patient has not seen an ophthalmologist this year. Patient denies any new issues with their feet. The symptom started onset as an adult hyperlipidemia and hypothyroidism ARE RELATED TO DM   Hypothyroidism recheck Patient is coming in for thyroid recheck today as well. They deny any issues with hair changes or heat or cold problems or diarrhea or constipation. They deny any chest pain or palpitations. They are currently on levothyroxine 88 micrograms   Hyperlipidemia Patient is coming in for recheck of his hyperlipidemia. The patient is currently taking no medication, she says she never tried the Zetia. They deny any issues with myalgias or history of liver damage from it. They deny any focal numbness or weakness or chest pain.   Relevant past medical, surgical, family and social history reviewed and updated as indicated. Interim medical history since our last visit reviewed. Allergies and medications reviewed and updated.  Review of Systems  Constitutional:  Negative for chills and fever.  Eyes:  Negative for visual disturbance.  Respiratory:  Negative for chest tightness and shortness of breath.   Cardiovascular:  Negative for chest pain and leg swelling.  Musculoskeletal:  Negative for back pain and gait problem.  Skin:  Negative for rash.  Neurological:  Negative for dizziness, light-headedness and headaches.  Psychiatric/Behavioral:  Negative for agitation  and behavioral problems.   All other systems reviewed and are negative.   Per HPI unless specifically indicated above   Allergies as of 07/28/2023       Reactions   Daypro [oxaprozin]    Unknown reaction   Diclofenac Sodium    Unknown reaction   Effexor [venlafaxine Hydrochloride]    Unknown reaction   Fluoxetine Hcl    Other Reaction(s): Not available, Not available, Not available, Not available, Not available   Lidocaine-prilocaine    Amitriptyline Rash   Cymbalta [duloxetine Hcl] Nausea Only   Duloxetine Nausea Only   Lipitor [atorvastatin Calcium] Other (See Comments)   Leg aches   Penicillins Rash   Has patient had a PCN reaction causing immediate rash, facial/tongue/throat swelling, SOB or lightheadedness with hypotension: Yes Has patient had a PCN reaction causing severe rash involving mucus membranes or skin necrosis: No Has patient had a PCN reaction that required hospitalization: No Has patient had a PCN reaction occurring within the last 10 years: No If all of the above answers are "NO", then may proceed with Cephalosporin use.   Savella [milnacipran Hcl] Rash   Sulfa Antibiotics Rash   Zocor [simvastatin] Other (See Comments)   Hip pain        Medication List        Accurate as of July 28, 2023 11:23 AM. If you have any questions, ask your nurse or doctor.          STOP taking these medications    predniSONE 20 MG tablet Commonly known  as: DELTASONE Stopped by: Jenna [name]       TAKE these medications    acetaminophen 500 MG tablet Commonly known as: TYLENOL Take 500 mg by mouth daily as needed for moderate pain or headache.   colchicine 0.6 MG tablet TAKE 1 TABLET DAILY AS NEEDED FOR GOUT FLARE UP.   ezetimibe 10 MG tablet Commonly known as: Zetia Take 1 tablet (10 mg total) by mouth daily.   fluticasone 0.05 % cream Commonly known as: CUTIVATE Apply 1 Application topically daily.   GARLIC CHOLESTA HEALTH PO Take by mouth.    hydrocortisone 2.5 % rectal cream Commonly known as: ANUSOL-HC apply rectally daily as needed for hemmorhoids or itching.   ketoconazole 2 % cream Commonly known as: NIZORAL SMARTSIG:1 Application Topical 1 to 2 Times Daily   levothyroxine 88 MCG tablet Commonly known as: SYNTHROID Take 1 tablet (88 mcg total) by mouth daily.   metoprolol succinate 50 MG 24 hr tablet Commonly known as: TOPROL-XL Take 1 tablet (50 mg total) by mouth daily. take 1 tablet daily with or immediately following a meal.   nitroGLYCERIN 0.4 MG SL tablet Commonly known as: NITROSTAT PLACE 1 TAB UNDER TONGUE EVERY 3 MINUTES AS DIRECTED UP TO 3 TIMES AS NEEDED FOR CHEST PAIN.   pantoprazole 40 MG tablet Commonly known as: PROTONIX Take 1 tablet (40 mg total) by mouth daily.   rivaroxaban 20 MG Tabs tablet Commonly known as: Xarelto Take 1 tablet (20 mg total) by mouth daily with supper.   Vitamin D3 50 MCG (2000 UT) Tabs Take 2,000 Units by mouth daily.         Objective:   BP (!) 151/63   Pulse 66   Ht 5\' 6"  (1.676 m)   Wt 197 lb (89.4 kg)   SpO2 93%   BMI 31.80 kg/m   Wt Readings from Last 3 Encounters:  07/28/23 197 lb (89.4 kg)  06/13/23 199 lb (90.3 kg)  04/27/23 198 lb (89.8 kg)    Physical Exam Vitals and nursing note reviewed.  Constitutional:      General: She is not in acute distress.    Appearance: She is well-developed. She is not diaphoretic.  Eyes:     Conjunctiva/sclera: Conjunctivae normal.  Cardiovascular:     Rate and Rhythm: Normal rate and regular rhythm.     Heart sounds: Normal heart sounds. No murmur heard. Pulmonary:     Effort: Pulmonary effort is normal. No respiratory distress.     Breath sounds: Normal breath sounds. No wheezing.  Musculoskeletal:        General: No swelling.  Skin:    General: Skin is warm and dry.     Findings: No rash.  Neurological:     Mental Status: She is alert and oriented to person, place, and time.     Coordination:  Coordination normal.  Psychiatric:        Behavior: Behavior normal.       Assessment & Plan:   Problem List Items Addressed This Visit       Cardiovascular and Mediastinum   Hypertension   Relevant Medications   ezetimibe (ZETIA) 10 MG tablet     Endocrine   Hypothyroidism   Relevant Medications   levothyroxine (SYNTHROID) 88 MCG tablet   Type 2 diabetes mellitus with other specified complication (HCC) - Primary   Relevant Orders   Bayer DCA Hb A1c Waived     Other   Hyperlipidemia   Relevant Medications  ezetimibe (ZETIA) 10 MG tablet    Encouraged to try the Zetia and also encouraged to talk to cardiology about trying medication.  A1c is: 6.9, no changes Follow up plan: Return in about 3 months (around 10/28/2023), or if symptoms worsen or fail to improve, for Diabetes and hypertension and hypothyroidism.  Counseling provided for all of the vaccine components Orders Placed This Encounter  Procedures   Bayer DCA Hb A1c Waived    Arville Care, MD [facility] 07/28/2023, 11:23 AM

## 2023-08-12 ENCOUNTER — Inpatient Hospital Stay: Payer: Medicare HMO

## 2023-08-15 ENCOUNTER — Ambulatory Visit (INDEPENDENT_AMBULATORY_CARE_PROVIDER_SITE_OTHER): Payer: Medicare HMO | Admitting: *Deleted

## 2023-08-15 DIAGNOSIS — E538 Deficiency of other specified B group vitamins: Secondary | ICD-10-CM | POA: Diagnosis not present

## 2023-08-15 NOTE — Progress Notes (Signed)
 Patient is in office today for a nurse visit for B12 Injection. Patient Injection was given in the  Right deltoid. Patient tolerated injection well.

## 2023-09-12 ENCOUNTER — Inpatient Hospital Stay: Payer: Medicare HMO

## 2023-09-12 ENCOUNTER — Ambulatory Visit

## 2023-09-13 DIAGNOSIS — H524 Presbyopia: Secondary | ICD-10-CM | POA: Diagnosis not present

## 2023-09-13 DIAGNOSIS — H353122 Nonexudative age-related macular degeneration, left eye, intermediate dry stage: Secondary | ICD-10-CM | POA: Diagnosis not present

## 2023-09-15 ENCOUNTER — Ambulatory Visit (INDEPENDENT_AMBULATORY_CARE_PROVIDER_SITE_OTHER)

## 2023-09-15 DIAGNOSIS — E538 Deficiency of other specified B group vitamins: Secondary | ICD-10-CM

## 2023-09-15 NOTE — Progress Notes (Signed)
 Patient is in office today for a nurse visit for B12 Injection. Patient Injection was given in the  Left deltoid. Patient tolerated injection well.

## 2023-09-30 ENCOUNTER — Other Ambulatory Visit: Payer: Self-pay | Admitting: Family Medicine

## 2023-10-12 ENCOUNTER — Inpatient Hospital Stay: Payer: Medicare HMO

## 2023-10-18 ENCOUNTER — Ambulatory Visit (INDEPENDENT_AMBULATORY_CARE_PROVIDER_SITE_OTHER)

## 2023-10-18 DIAGNOSIS — E538 Deficiency of other specified B group vitamins: Secondary | ICD-10-CM

## 2023-10-18 NOTE — Progress Notes (Signed)
 Patient is in office today for a nurse visit for B12 Injection. Patient Injection was given in the  Right deltoid. Patient tolerated injection well.

## 2023-11-03 ENCOUNTER — Encounter: Payer: Self-pay | Admitting: Family Medicine

## 2023-11-03 ENCOUNTER — Ambulatory Visit (INDEPENDENT_AMBULATORY_CARE_PROVIDER_SITE_OTHER): Admitting: Family Medicine

## 2023-11-03 VITALS — BP 161/62 | HR 64 | Temp 97.0°F | Ht 66.0 in | Wt 197.8 lb

## 2023-11-03 DIAGNOSIS — I10 Essential (primary) hypertension: Secondary | ICD-10-CM | POA: Diagnosis not present

## 2023-11-03 DIAGNOSIS — E039 Hypothyroidism, unspecified: Secondary | ICD-10-CM | POA: Diagnosis not present

## 2023-11-03 DIAGNOSIS — E782 Mixed hyperlipidemia: Secondary | ICD-10-CM

## 2023-11-03 DIAGNOSIS — E1169 Type 2 diabetes mellitus with other specified complication: Secondary | ICD-10-CM

## 2023-11-03 LAB — BAYER DCA HB A1C WAIVED: HB A1C (BAYER DCA - WAIVED): 6.8 % — ABNORMAL HIGH (ref 4.8–5.6)

## 2023-11-03 NOTE — Progress Notes (Signed)
 BP (!) 161/62   Pulse 64   Temp (!) 97 F (36.1 C) (Temporal)   Ht 5' 6 (1.676 m)   Wt 197 lb 12.8 oz (89.7 kg)   SpO2 93%   BMI 31.93 kg/m    Subjective:   Patient ID: Jenna [name], female    DOB: June 17, 1935, 88 y.o.   MRN: [id number]  HPI: Jenna [name] is a 88 y.o. female presenting on 11/03/2023 for Medical Management of Chronic Issues   HPI Type 2 diabetes mellitus Patient comes in today for recheck of his diabetes. Patient has been currently taking no medication, diet control. Patient is not currently on an ACE inhibitor/ARB. Patient has seen an ophthalmologist this year. Patient denies any new issues with their feet. The symptom started onset as an adult hypertension and hyperlipidemia and hypothyroidism and A-fib ARE RELATED TO DM   Hypothyroidism recheck Patient is coming in for thyroid  recheck today as well. They deny any issues with hair changes or heat or cold problems or diarrhea or constipation. They deny any chest pain or palpitations. They are currently on levothyroxine  88 micrograms   Hyperlipidemia Patient is coming in for recheck of his hyperlipidemia. The patient is currently taking Zetia . They deny any issues with myalgias or history of liver damage from it. They deny any focal numbness or weakness or chest pain.   Hypertension Patient is currently on metoprolol , and their blood pressure today is 161/62, she brings in multiple home readings ranging from the 180s down to the 120s.  Over the past week she has tried to focus on changing her diet and not been as much fat back in salty foods and has been running in the 139's and 140s over the past week at home.. Patient denies any lightheadedness or dizziness. Patient denies headaches, blurred vision, chest pains, shortness of breath, or weakness. Denies any side effects from medication and is content with current medication.   Relevant past medical, surgical, family and social history reviewed and updated as  indicated. Interim medical history since our last visit reviewed. Allergies and medications reviewed and updated.  Review of Systems  Constitutional:  Negative for chills and fever.  HENT:  Negative for congestion, ear discharge and ear pain.   Eyes:  Negative for redness and visual disturbance.  Respiratory:  Negative for chest tightness and shortness of breath.   Cardiovascular:  Negative for chest pain and leg swelling.  Genitourinary:  Negative for difficulty urinating and dysuria.  Musculoskeletal:  Negative for back pain and gait problem.  Skin:  Negative for rash.  Neurological:  Negative for dizziness, light-headedness and headaches.  Psychiatric/Behavioral:  Negative for agitation and behavioral problems.   All other systems reviewed and are negative.   Per HPI unless specifically indicated above   Allergies as of 11/03/2023       Reactions   Daypro [oxaprozin]    Unknown reaction   Diclofenac Sodium    Unknown reaction   Effexor [venlafaxine Hydrochloride]    Unknown reaction   Fluoxetine Hcl    Other Reaction(s): Not available, Not available, Not available, Not available, Not available   Lidocaine -prilocaine    Amitriptyline Rash   Cymbalta [duloxetine Hcl] Nausea Only   Duloxetine Nausea Only   Lipitor [atorvastatin Calcium ] Other (See Comments)   Leg aches   Penicillins Rash   Has patient had a PCN reaction causing immediate rash, facial/tongue/throat swelling, SOB or lightheadedness with hypotension: Yes Has patient had a PCN reaction causing  severe rash involving mucus membranes or skin necrosis: No Has patient had a PCN reaction that required hospitalization: No Has patient had a PCN reaction occurring within the last 10 years: No If all of the above answers are NO, then may proceed with Cephalosporin use.   Savella [milnacipran Hcl] Rash   Sulfa Antibiotics Rash   Zocor [simvastatin] Other (See Comments)   Hip pain        Medication List         Accurate as of November 03, 2023 10:24 AM. If you have any questions, ask your nurse or doctor.          acetaminophen  500 MG tablet Commonly known as: TYLENOL  Take 500 mg by mouth daily as needed for moderate pain or headache.   colchicine  0.6 MG tablet TAKE 1 TABLET DAILY AS NEEDED FOR GOUT FLARE UP.   ezetimibe  10 MG tablet Commonly known as: Zetia  Take 1 tablet (10 mg total) by mouth daily.   fluticasone  0.05 % cream Commonly known as: CUTIVATE  Apply 1 Application topically daily.   GARLIC CHOLESTA HEALTH PO Take by mouth.   hydrocortisone  2.5 % rectal cream Commonly known as: ANUSOL -HC apply rectally daily as needed for hemmorhoids or itching.   ketoconazole 2 % cream Commonly known as: NIZORAL SMARTSIG:1 Application Topical 1 to 2 Times Daily   levothyroxine  88 MCG tablet Commonly known as: SYNTHROID  Take 1 tablet (88 mcg total) by mouth daily.   metoprolol  succinate 50 MG 24 hr tablet Commonly known as: TOPROL -XL Take 1 tablet (50 mg total) by mouth daily. take 1 tablet daily with or immediately following a meal.   nitroGLYCERIN  0.4 MG SL tablet Commonly known as: NITROSTAT  PLACE 1 TAB UNDER TONGUE EVERY 3 MINUTES AS DIRECTED UP TO 3 TIMES AS NEEDED FOR CHEST PAIN.   pantoprazole  40 MG tablet Commonly known as: PROTONIX  Take 1 tablet (40 mg total) by mouth daily.   rivaroxaban  20 MG Tabs tablet Commonly known as: Xarelto  Take 1 tablet (20 mg total) by mouth daily with supper.   Vitamin D3 50 MCG (2000 UT) Tabs Take 2,000 Units by mouth daily.         Objective:   BP (!) 161/62   Pulse 64   Temp (!) 97 F (36.1 C) (Temporal)   Ht 5' 6 (1.676 m)   Wt 197 lb 12.8 oz (89.7 kg)   SpO2 93%   BMI 31.93 kg/m   Wt Readings from Last 3 Encounters:  11/03/23 197 lb 12.8 oz (89.7 kg)  07/28/23 197 lb (89.4 kg)  06/13/23 199 lb (90.3 kg)    Physical Exam Vitals and nursing note reviewed.  Constitutional:      General: She is not in acute  distress.    Appearance: She is well-developed. She is not diaphoretic.   Eyes:     Conjunctiva/sclera: Conjunctivae normal.    Cardiovascular:     Rate and Rhythm: Normal rate and regular rhythm.     Heart sounds: Normal heart sounds. No murmur heard. Pulmonary:     Effort: Pulmonary effort is normal. No respiratory distress.     Breath sounds: Normal breath sounds. No wheezing.   Musculoskeletal:        General: No swelling.   Skin:    General: Skin is warm and dry.     Findings: No rash.   Neurological:     Mental Status: She is alert and oriented to person, place, and time.  Coordination: Coordination normal.   Psychiatric:        Behavior: Behavior normal.       Assessment & Plan:   Problem List Items Addressed This Visit       Cardiovascular and Mediastinum   Hypertension - Primary   Relevant Orders   CBC with Differential/Platelet   CMP14+EGFR     Endocrine   Hypothyroidism   Relevant Orders   TSH   Type 2 diabetes mellitus with other specified complication (HCC)   Relevant Orders   Bayer DCA Hb A1c Waived     Other   Hyperlipidemia   Relevant Orders   Lipid panel    A1c 6.8.  Her blood pressures are looking better at home and she is making dietary and lifestyle changes, will not adjust her blood pressure medications today but will keep a close eye on it and she will continue to check it every day.  Patient never started Zetia  because she is wary of it, will see where her levels are today and then reassess Follow up plan: Return in about 3 months (around 02/03/2024), or if symptoms worsen or fail to improve, for Diabetes hyperlipidemia hypertension and hypothyroidism.  Counseling provided for all of the vaccine components Orders Placed This Encounter  Procedures   CBC with Differential/Platelet   CMP14+EGFR   Lipid panel   TSH   Bayer DCA Hb A1c Waived    Jolyne Needs, MD [facility]ick[facility] Gr[facility] 11/03/2023, 10:24  AM

## 2023-11-04 ENCOUNTER — Inpatient Hospital Stay: Payer: Medicare HMO | Attending: Hematology

## 2023-11-04 DIAGNOSIS — E538 Deficiency of other specified B group vitamins: Secondary | ICD-10-CM | POA: Insufficient documentation

## 2023-11-04 DIAGNOSIS — D509 Iron deficiency anemia, unspecified: Secondary | ICD-10-CM | POA: Insufficient documentation

## 2023-11-04 LAB — LIPID PANEL
Chol/HDL Ratio: 3.6 ratio (ref 0.0–4.4)
Cholesterol, Total: 222 mg/dL — ABNORMAL HIGH (ref 100–199)
HDL: 61 mg/dL (ref >39)
LDL Chol Calc (NIH): 135 mg/dL — ABNORMAL HIGH (ref 0–99)
Triglycerides: 146 mg/dL (ref 0–149)
VLDL Cholesterol Cal: 26 mg/dL (ref 5–40)

## 2023-11-04 LAB — CMP14+EGFR
ALT: 9 IU/L (ref 0–32)
AST: 12 IU/L (ref 0–40)
Albumin: 4.3 g/dL (ref 3.7–4.7)
Alkaline Phosphatase: 105 IU/L (ref 44–121)
BUN/Creatinine Ratio: 12 (ref 12–28)
BUN: 7 mg/dL — ABNORMAL LOW (ref 8–27)
Bilirubin Total: 0.6 mg/dL (ref 0.0–1.2)
CO2: 21 mmol/L (ref 20–29)
Calcium: 9.1 mg/dL (ref 8.7–10.3)
Chloride: 96 mmol/L (ref 96–106)
Creatinine, Ser: 0.6 mg/dL (ref 0.57–1.00)
Globulin, Total: 2 g/dL (ref 1.5–4.5)
Glucose: 130 mg/dL — ABNORMAL HIGH (ref 70–99)
Potassium: 4.4 mmol/L (ref 3.5–5.2)
Sodium: 135 mmol/L (ref 134–144)
Total Protein: 6.3 g/dL (ref 6.0–8.5)
eGFR: 87 mL/min/{1.73_m2} (ref >59)

## 2023-11-04 LAB — CBC WITH DIFFERENTIAL/PLATELET
Basophils Absolute: 0 10*3/uL (ref 0.0–0.2)
Basos: 1 %
EOS (ABSOLUTE): 0.1 10*3/uL (ref 0.0–0.4)
Eos: 2 %
Hematocrit: 42 % (ref 34.0–46.6)
Hemoglobin: 13.6 g/dL (ref 11.1–15.9)
Immature Grans (Abs): 0 10*3/uL (ref 0.0–0.1)
Immature Granulocytes: 0 %
Lymphocytes Absolute: 1 10*3/uL (ref 0.7–3.1)
Lymphs: 17 %
MCH: 31.6 pg (ref 26.6–33.0)
MCHC: 32.4 g/dL (ref 31.5–35.7)
MCV: 97 fL (ref 79–97)
Monocytes Absolute: 0.4 10*3/uL (ref 0.1–0.9)
Monocytes: 6 %
Neutrophils Absolute: 4.5 10*3/uL (ref 1.4–7.0)
Neutrophils: 74 %
Platelets: 203 10*3/uL (ref 150–450)
RBC: 4.31 x10E6/uL (ref 3.77–5.28)
RDW: 12.7 % (ref 11.7–15.4)
WBC: 6 10*3/uL (ref 3.4–10.8)

## 2023-11-04 LAB — TSH: TSH: 2.92 u[IU]/mL (ref 0.450–4.500)

## 2023-11-08 ENCOUNTER — Inpatient Hospital Stay

## 2023-11-08 DIAGNOSIS — E538 Deficiency of other specified B group vitamins: Secondary | ICD-10-CM | POA: Diagnosis not present

## 2023-11-08 DIAGNOSIS — D500 Iron deficiency anemia secondary to blood loss (chronic): Secondary | ICD-10-CM

## 2023-11-08 DIAGNOSIS — D509 Iron deficiency anemia, unspecified: Secondary | ICD-10-CM | POA: Diagnosis not present

## 2023-11-08 LAB — VITAMIN B12: Vitamin B-12: 414 pg/mL (ref 180–914)

## 2023-11-08 LAB — CBC WITH DIFFERENTIAL/PLATELET
Abs Immature Granulocytes: 0.05 10*3/uL (ref 0.00–0.07)
Basophils Absolute: 0 10*3/uL (ref 0.0–0.1)
Basophils Relative: 1 %
Eosinophils Absolute: 0.1 10*3/uL (ref 0.0–0.5)
Eosinophils Relative: 2 %
HCT: 38.1 % (ref 36.0–46.0)
Hemoglobin: 12.7 g/dL (ref 12.0–15.0)
Immature Granulocytes: 1 %
Lymphocytes Relative: 15 %
Lymphs Abs: 0.9 10*3/uL (ref 0.7–4.0)
MCH: 31.4 pg (ref 26.0–34.0)
MCHC: 33.3 g/dL (ref 30.0–36.0)
MCV: 94.1 fL (ref 80.0–100.0)
Monocytes Absolute: 0.5 10*3/uL (ref 0.1–1.0)
Monocytes Relative: 9 %
Neutro Abs: 4.4 10*3/uL (ref 1.7–7.7)
Neutrophils Relative %: 72 %
Platelets: 154 10*3/uL (ref 150–400)
RBC: 4.05 MIL/uL (ref 3.87–5.11)
RDW: 12.1 % (ref 11.5–15.5)
WBC: 6 10*3/uL (ref 4.0–10.5)
nRBC: 0 % (ref 0.0–0.2)

## 2023-11-08 LAB — FERRITIN: Ferritin: 38 ng/mL (ref 11–307)

## 2023-11-08 LAB — IRON AND TIBC
Iron: 63 ug/dL (ref 28–170)
Saturation Ratios: 21 % (ref 10.4–31.8)
TIBC: 306 ug/dL (ref 250–450)
UIBC: 243 ug/dL

## 2023-11-11 ENCOUNTER — Inpatient Hospital Stay: Payer: Medicare HMO

## 2023-11-11 ENCOUNTER — Ambulatory Visit: Payer: Self-pay | Admitting: Family Medicine

## 2023-11-11 ENCOUNTER — Inpatient Hospital Stay (HOSPITAL_BASED_OUTPATIENT_CLINIC_OR_DEPARTMENT_OTHER): Payer: Medicare HMO | Admitting: Oncology

## 2023-11-11 DIAGNOSIS — D500 Iron deficiency anemia secondary to blood loss (chronic): Secondary | ICD-10-CM | POA: Diagnosis not present

## 2023-11-11 DIAGNOSIS — D509 Iron deficiency anemia, unspecified: Secondary | ICD-10-CM | POA: Diagnosis not present

## 2023-11-11 DIAGNOSIS — E538 Deficiency of other specified B group vitamins: Secondary | ICD-10-CM

## 2023-11-11 LAB — METHYLMALONIC ACID, SERUM: Methylmalonic Acid, Quantitative: 156 nmol/L (ref 0–378)

## 2023-11-11 MED ORDER — NEXLETOL 180 MG PO TABS: 180.0000 mg | ORAL_TABLET | Freq: Every day | ORAL | 1 refills | Status: DC

## 2023-11-11 NOTE — Progress Notes (Signed)
 [facility] [address], [place], [place] [address] Main: [phone] Fax: [phone]  Follow-up Visit    Patient Care Team: Dettinger, Lucio Sabin, MD as PCP - General (Family Medicine) Gerard Knight, MD as PCP - Cardiology (Cardiology) Ruby Corporal, MD (Inactive) as Consulting Physician (Gastroenterology) Gerard Knight, MD as Consulting Physician (Cardiology) Liliane Rei, MD as Consulting Physician (Orthopedic Surgery) Dr Patria [facility], OD (Optometry)   Name of the patient: Jenna [name]  [id number]  [birth year]   Date of visit: 11/11/2023   History of Present Illness: Jenna [name] is an 88 year old female who is followed by hematology for iron  deficiency anemia.  She was last seen in clinic on 07/14/23 by me.  She is currently receiving monthly B12 injections here at our clinic.  She received 4 doses of 300 mg IV Venofer  between 03/11/2023 and 04/01/2023.  Reports tolerating infusions well. Slight improvement in energy levels.  Reports occasional hemorrhoidal bleeding when she is constipated.  Since her last visit, she denies any hospitalizations, surgeries or changes to her baseline health. Unable to tolerate oral iron  secondary to severe constipation.  Reports she has been feeling slightly more fatigued over the last month or so.  Observations/Objective:Review of Systems  Constitutional:  Positive for malaise/fatigue.  Respiratory:  Positive for shortness of breath.   Neurological:  Positive for sensory change.  Psychiatric/Behavioral:  The patient has insomnia.      Physical Exam Constitutional:      Appearance: Normal appearance.   Cardiovascular:     Rate and Rhythm: Normal rate and regular rhythm.  Pulmonary:     Effort: Pulmonary effort is normal.     Breath sounds: Normal breath sounds.  Abdominal:     General: Bowel sounds are normal.     Palpations: Abdomen is soft.   Musculoskeletal:         General: No swelling. Normal range of motion.   Neurological:     Mental Status: She is alert and oriented to person, place, and time. Mental status is at baseline.     Assessment and Plan: 1. Iron  deficiency anemia due to chronic blood loss (Primary) -Has been receiving intermittent IV iron  for several years. -Etiology felt to be secondary to malabsorption.  She is currently taking pantoprazole  for esophagitis.  Had upper endoscopy on 05/01/2018 which showed a 6 mm polyp with ulceration but no bleeding and no stigmata of recent bleeding in the gastric body and posterior wall of the stomach.  Pathology was negative for H. pylori. -She received 4 doses of 300 mg IV Venofer  on 03/11/2023, 03/18/2023, 03/25/2023 and 04/01/2023 with good tolerance. -Repeat lab work from 11/03/2023 shows a hemoglobin of 13.6, hematocrit 42 with MCV 97.  Differential is unremarkable.  Iron  saturation is 21%, TIBC 306 and ferritin is 38. - Given decline in ferritin levels, would recommend for additional doses of 300 mg IV Venofer  given over the next month or so.  Based on history, she needs IV iron  approximately every 6 to 7 months. -She has been unable to tolerate oral iron  due to GI upset. - Return to clinic in 6 months with labs a few days before.  2. B12 deficiency -Has been receiving monthly B12 injections. -Last B12 injection was on 10/18/23. -Labs from 11/08/2023 show a vitamin B12 level of 414. -Recommend she continue B12 injections monthly.  Follow Up Instructions: PLAN SUMMARY: >> Continue monthly B12 injections. >> Recommend for additional doses  of 300 mg IV Venofer . >> Based on history, recommend follow-up in 6 months.  She has needed IV iron  approximately every 6 to 7 months.       I discussed the assessment and treatment plan with the patient. The patient was provided an opportunity to ask questions and all were answered. The patient agreed with the plan and demonstrated an understanding of the  instructions.   The patient was advised to call back or seek an in-person evaluation if the symptoms worsen or if the condition fails to improve as anticipated.  I spent 25 minutes dedicated to the care of this patient (face-to-face and non-face-to-face) on the date of the encounter to include what is described in the assessment and plan.   Aurther Blue, NP

## 2023-11-18 ENCOUNTER — Inpatient Hospital Stay

## 2023-11-18 VITALS — BP 172/66 | HR 70 | Temp 98.2°F | Resp 16

## 2023-11-18 DIAGNOSIS — E538 Deficiency of other specified B group vitamins: Secondary | ICD-10-CM | POA: Diagnosis not present

## 2023-11-18 DIAGNOSIS — D500 Iron deficiency anemia secondary to blood loss (chronic): Secondary | ICD-10-CM

## 2023-11-18 DIAGNOSIS — D509 Iron deficiency anemia, unspecified: Secondary | ICD-10-CM | POA: Diagnosis not present

## 2023-11-18 DIAGNOSIS — D519 Vitamin B12 deficiency anemia, unspecified: Secondary | ICD-10-CM

## 2023-11-18 MED ORDER — SODIUM CHLORIDE 0.9 % IV SOLN
300.0000 mg | Freq: Once | INTRAVENOUS | Status: AC
  Administered 2023-11-18: 300 mg via INTRAVENOUS
  Filled 2023-11-18: qty 200

## 2023-11-18 MED ORDER — CYANOCOBALAMIN 1000 MCG/ML IJ SOLN: 1000.0000 ug | Freq: Once | INTRAMUSCULAR | Status: DC

## 2023-11-18 MED ORDER — SODIUM CHLORIDE 0.9 % IV SOLN: INTRAVENOUS | Status: DC

## 2023-11-18 NOTE — Patient Instructions (Signed)

## 2023-11-18 NOTE — Progress Notes (Signed)
 Patient presents today for Venofer infusion per providers order.  Vital signs WNL.  Patient has no new complaints at this time.  Peripheral IV started and blood return noted pre and post infusion.  Stable during infusion without adverse affects.  Vital signs stable.  No complaints at this time.  Discharge from clinic ambulatory in stable condition.  Alert and oriented X 3.  Follow up with [facility] as scheduled.

## 2023-11-21 ENCOUNTER — Ambulatory Visit (INDEPENDENT_AMBULATORY_CARE_PROVIDER_SITE_OTHER)

## 2023-11-21 DIAGNOSIS — E538 Deficiency of other specified B group vitamins: Secondary | ICD-10-CM

## 2023-11-21 NOTE — Progress Notes (Signed)
 Patient is in office today for a nurse visit for B12 Injection. Patient Injection was given in the  Left deltoid. Patient tolerated injection well.

## 2023-11-23 ENCOUNTER — Other Ambulatory Visit: Payer: Self-pay | Admitting: Family Medicine

## 2023-11-28 ENCOUNTER — Inpatient Hospital Stay: Attending: Hematology

## 2023-11-28 VITALS — BP 150/57 | HR 64 | Temp 96.4°F | Resp 19

## 2023-11-28 DIAGNOSIS — D509 Iron deficiency anemia, unspecified: Secondary | ICD-10-CM | POA: Insufficient documentation

## 2023-11-28 DIAGNOSIS — D500 Iron deficiency anemia secondary to blood loss (chronic): Secondary | ICD-10-CM

## 2023-11-28 DIAGNOSIS — D519 Vitamin B12 deficiency anemia, unspecified: Secondary | ICD-10-CM

## 2023-11-28 MED ORDER — SODIUM CHLORIDE 0.9 % IV SOLN: INTRAVENOUS | Status: DC

## 2023-11-28 MED ORDER — SODIUM CHLORIDE 0.9 % IV SOLN
300.0000 mg | Freq: Once | INTRAVENOUS | Status: AC
  Administered 2023-11-28: 300 mg via INTRAVENOUS
  Filled 2023-11-28: qty 200

## 2023-11-28 NOTE — Progress Notes (Signed)
 Patient tolerated iron  infusion with no complaints voiced.  Peripheral IV site clean and dry with good blood return noted before and after infusion. Gauze and coban wrap applied.  Pt observed for 30 minutes post iron  infusion without any complications. VSS with discharge and left in satisfactory condition with no s/s of distress noted. All follow ups as scheduled.   Jenna [name] [employer]

## 2023-11-28 NOTE — Patient Instructions (Signed)

## 2023-12-05 ENCOUNTER — Inpatient Hospital Stay

## 2023-12-05 VITALS — BP 175/58 | HR 63 | Temp 97.6°F | Resp 18

## 2023-12-05 DIAGNOSIS — D519 Vitamin B12 deficiency anemia, unspecified: Secondary | ICD-10-CM

## 2023-12-05 DIAGNOSIS — D509 Iron deficiency anemia, unspecified: Secondary | ICD-10-CM | POA: Diagnosis not present

## 2023-12-05 DIAGNOSIS — D500 Iron deficiency anemia secondary to blood loss (chronic): Secondary | ICD-10-CM

## 2023-12-05 MED ORDER — SODIUM CHLORIDE 0.9 % IV SOLN: INTRAVENOUS | Status: DC

## 2023-12-05 MED ORDER — SODIUM CHLORIDE 0.9 % IV SOLN
300.0000 mg | Freq: Once | INTRAVENOUS | Status: AC
  Administered 2023-12-05: 300 mg via INTRAVENOUS
  Filled 2023-12-05: qty 15

## 2023-12-05 NOTE — Progress Notes (Signed)
 Patient tolerated iron infusion with no complaints voiced.  Peripheral IV site clean and dry with good blood return noted before and after infusion.  Band aid applied.  VSS with discharge and left in satisfactory condition with no s/s of distress noted.

## 2023-12-05 NOTE — Patient Instructions (Signed)

## 2023-12-05 NOTE — Progress Notes (Signed)
Patient presents today for iron infusion.  Patient is in satisfactory condition with no new complaints voiced.  Vital signs are stable.  We will proceed with infusion per provider orders.    Peripheral IV started with good blood return 

## 2023-12-12 ENCOUNTER — Inpatient Hospital Stay

## 2023-12-12 VITALS — BP 147/61 | HR 66 | Temp 97.6°F | Resp 18

## 2023-12-12 DIAGNOSIS — D500 Iron deficiency anemia secondary to blood loss (chronic): Secondary | ICD-10-CM

## 2023-12-12 DIAGNOSIS — D509 Iron deficiency anemia, unspecified: Secondary | ICD-10-CM | POA: Diagnosis not present

## 2023-12-12 DIAGNOSIS — D519 Vitamin B12 deficiency anemia, unspecified: Secondary | ICD-10-CM

## 2023-12-12 MED ORDER — SODIUM CHLORIDE 0.9 % IV SOLN: INTRAVENOUS | Status: DC

## 2023-12-12 MED ORDER — METOPROLOL SUCCINATE ER 50 MG PO TB24
50.0000 mg | ORAL_TABLET | Freq: Once | ORAL | Status: AC
  Administered 2023-12-12: 50 mg via ORAL
  Filled 2023-12-12: qty 1

## 2023-12-12 MED ORDER — SODIUM CHLORIDE 0.9 % IV SOLN
300.0000 mg | Freq: Once | INTRAVENOUS | Status: AC
  Administered 2023-12-12: 300 mg via INTRAVENOUS
  Filled 2023-12-12: qty 100

## 2023-12-12 NOTE — Patient Instructions (Signed)
 [facility] - A DEPT OF [facility]  Discharge Instructions: Thank you for choosing [facility] to provide your oncology and hematology care.  If you have a lab appointment with the Cancer Center - please note that after April 8th, 2024, all labs will be drawn in the cancer center.  You do not have to check in or register with the main entrance as you have in the past but will complete your check-in in the cancer center.  Wear comfortable clothing and clothing appropriate for easy access to any Portacath or PICC line.   We strive to give you quality time with your provider. You may need to reschedule your appointment if you arrive late (15 or more minutes).  Arriving late affects you and other patients whose appointments are after yours.  Also, if you miss three or more appointments without notifying the office, you may be dismissed from the clinic at the provider's discretion.      For prescription refill requests, have your pharmacy contact our office and allow 72 hours for refills to be completed.    Today you received Venofer  300 mg IV iron  infusion.      BELOW ARE SYMPTOMS THAT SHOULD BE REPORTED IMMEDIATELY: *FEVER GREATER THAN 100.4 F (38 C) OR HIGHER *CHILLS OR SWEATING *NAUSEA AND VOMITING THAT IS NOT CONTROLLED WITH YOUR NAUSEA MEDICATION *UNUSUAL SHORTNESS OF BREATH *UNUSUAL BRUISING OR BLEEDING *URINARY PROBLEMS (pain or burning when urinating, or frequent urination) *BOWEL PROBLEMS (unusual diarrhea, constipation, pain near the anus) TENDERNESS IN MOUTH AND THROAT WITH OR WITHOUT PRESENCE OF ULCERS (sore throat, sores in mouth, or a toothache) UNUSUAL RASH, SWELLING OR PAIN  UNUSUAL VAGINAL DISCHARGE OR ITCHING   Items with * indicate a potential emergency and should be followed up as soon as possible or go to the Emergency Department if any problems should occur.  Please show the CHEMOTHERAPY ALERT CARD or IMMUNOTHERAPY ALERT  CARD at check-in to the Emergency Department and triage nurse.  Should you have questions after your visit or need to cancel or reschedule your appointment, please contact [facility] [place] - A DEPT OF [facility] [phone]  and follow the prompts.  Office hours are 8:00 a.m. to 4:30 p.m. Monday - Friday. Please note that voicemails left after 4:00 p.m. may not be returned until the following business day.  We are closed weekends and major holidays. You have access to a nurse at all times for urgent questions. Please call the main number to the clinic [phone] and follow the prompts.  For any non-urgent questions, you may also contact your provider using MyChart. We now offer e-Visits for anyone 50 and older to request care online for non-urgent symptoms. For details visit [email or web address].   Also download the MyChart app! Go to the app store, search MyChart, open the app, select [facility], and log in with your MyChart username and password.

## 2023-12-12 NOTE — Progress Notes (Signed)
 Patient presents today for iron  infusion.  Patient is in satisfactory condition with no new complaints voiced.  Vital signs are stable.  We will proceed with infusion per provider orders.    Peripheral IV started with good blood return pre and post infusion. Patient's BP noted to be 175/67 today. Patient stated she ran out of BP medication this morning. Metoprolol  succinate 50 mg was given at the clinic today which is the BP medication patient takes every morning. Vitals obtained again after iron  was completed and noted to be 147/61.  Venofer  300 mg given today per MD orders. Tolerated infusion without adverse affects. Vital signs stable. No complaints at this time. Discharged from clinic ambulatory in stable condition. Alert and oriented x 3. F/U with [facility] as scheduled.

## 2023-12-22 ENCOUNTER — Ambulatory Visit

## 2023-12-23 ENCOUNTER — Ambulatory Visit (INDEPENDENT_AMBULATORY_CARE_PROVIDER_SITE_OTHER)

## 2023-12-23 DIAGNOSIS — E538 Deficiency of other specified B group vitamins: Secondary | ICD-10-CM

## 2023-12-23 NOTE — Progress Notes (Signed)
 Patient is in office today for a nurse visit for B12 Injection. Patient Injection was given in the  Right deltoid. Patient tolerated injection well.

## 2023-12-29 ENCOUNTER — Other Ambulatory Visit: Payer: Self-pay | Admitting: Family Medicine

## 2024-01-13 ENCOUNTER — Ambulatory Visit: Payer: Self-pay | Admitting: Family Medicine

## 2024-01-13 NOTE — Telephone Encounter (Signed)
 FYI noted

## 2024-01-13 NOTE — Telephone Encounter (Signed)
 FYI Only or Action Required?: FYI only for provider.  Patient was last seen in primary care on 11/03/2023 by Dettinger, Fonda LABOR, MD.  Called Nurse Triage reporting Rash.  Symptoms began today.  Interventions attempted: Nothing.  Symptoms are: unchanged.  Triage Disposition: See Physician Within 24 Hours  Patient/caregiver understands and will follow disposition?: Yes                        Copied from CRM #[id number]. Topic: Clinical - Red Word Triage >> Jan 13, 2024  1:50 PM Yolanda T wrote: [facility] that prompted transfer to Nurse Triage: patient says she has an outbreak of bites on her inner thigh and down both her legs to the her knee. They itch really bad. Reason for Disposition  [1] MODERATE-SEVERE hives (e.g.,hives interfere with normal activities or work) AND [2] not improved after taking antihistamine (e.g., cetirizine , fexofenadine, or loratadine ) > 24 hours  Answer Assessment - Initial Assessment Questions This RN recommends pt goes to urgent care within 24 hours as no appointment availability in office. Pt agreeable.    APPEARANCE: What does the rash look like?      Looks like mosquito bites on both inner thighs down legs noticed on Thursday  NUMBER: How many hives are there?      Too many of them  SIZE: How big are the hives? (e.g., inches, cm, compare to coins) Do they all look the same or do they vary in shape and size?      Look like a big old mosquito bite  ITCHING: Does it itch? If Yes, ask: How bad is the itch?  (e.g., none, mild, moderate, severe)     Severe until pt puts some cream on it  Denies facial swelling, difficulty breathing, or a rash  Pt states her husband pulled six ticks off her back today  Protocols used: Hives-A-AH

## 2024-01-18 ENCOUNTER — Encounter: Payer: Self-pay | Admitting: Cardiology

## 2024-01-18 ENCOUNTER — Ambulatory Visit: Attending: Cardiology | Admitting: Cardiology

## 2024-01-18 VITALS — BP 136/72 | HR 66 | Ht 66.0 in | Wt 198.2 lb

## 2024-01-18 DIAGNOSIS — I890 Lymphedema, not elsewhere classified: Secondary | ICD-10-CM | POA: Diagnosis not present

## 2024-01-18 DIAGNOSIS — I480 Paroxysmal atrial fibrillation: Secondary | ICD-10-CM

## 2024-01-18 DIAGNOSIS — I10 Essential (primary) hypertension: Secondary | ICD-10-CM

## 2024-01-18 NOTE — Patient Instructions (Addendum)

## 2024-01-18 NOTE — Progress Notes (Signed)
    Cardiology Office Note  Date: 01/18/2024   ID: Jenna, [name] 12-26-1935, MRN [id number]  History of Present Illness: Jenna [name] is an 88 y.o. female last seen in July 2024.  She is here for a routine visit.  Reports an occasional sense of palpitations when she is still at nighttime, no progressive symptoms however.  No dizziness or syncope.  We discussed her medications.  She does not report any spontaneous bleeding problems on Xarelto  20 mg daily.  Creatinine clearance is 95 based on most recent lab work.  I reviewed her ECG today which shows normal sinus rhythm.  Physical Exam: VS:  BP 136/72 (BP Location: Left Arm)   Pulse 66   Ht 5' 6 (1.676 m)   Wt 198 lb 3.2 oz (89.9 kg)   SpO2 95%   BMI 31.99 kg/m , BMI Body mass index is 31.99 kg/m.  Wt Readings from Last 3 Encounters:  01/18/24 198 lb 3.2 oz (89.9 kg)  11/11/23 199 lb 11.8 oz (90.6 kg)  11/03/23 197 lb 12.8 oz (89.7 kg)    General: Patient appears comfortable at rest. HEENT: Conjunctiva and lids normal. Neck: Supple, no elevated JVP or carotid bruits. Lungs: Clear to auscultation, nonlabored breathing at rest. Cardiac: Regular rate and rhythm, no S3 or significant systolic murmur. Extremities: Stable bilateral lymphedema.  ECG:  An ECG dated 11/22/2022 was personally reviewed today and demonstrated:  Sinus rhythm.  Labwork: 11/03/2023: ALT 9; AST 12; BUN 7; Creatinine, Ser 0.60; Potassium 4.4; Sodium 135; TSH 2.920 11/08/2023: Hemoglobin 12.7; Platelets 154     Component Value Date/Time   CHOL 222 (H) 11/03/2023 0955   TRIG 146 11/03/2023 0955   TRIG 161 (H) 10/14/2016 1010   HDL 61 11/03/2023 0955   HDL 54 10/14/2016 1010   CHOLHDL 3.6 11/03/2023 0955   CHOLHDL 3.6 10/16/2008 0540   VLDL 23 10/16/2008 0540   LDLCALC 135 (H) 11/03/2023 0955   LDLCALC 138 (H) 02/26/2014 1001   Other Studies Reviewed Today:  No interval cardiac testing for review today.  Assessment and Plan:  1.   Paroxysmal atrial fibrillation/flutter with CHA2DS2-VASc score of 3.  No increasing sense of palpitations on current regimen including Xarelto  20 mg daily for stroke prophylaxis and Toprol  XL 50 mg daily.  She is in sinus rhythm by ECG today.  I reviewed her interval lab work.  No reported spontaneous bleeding problems.   2.  Lymphedema.  Managed with intermittent use of lower extremity compression stockings.   3.  Primary hypertension.  No change to current regimen.  4.  Mixed hyperlipidemia with statin intolerance.  LDL 135 in June.  She was prescribed trial of bempedoic acid  180 mg daily by PCP and has elected not to take it.  She states that she is concerned about using any medications for cholesterol at this point.  Disposition:  Follow up 1 year.  Signed, Jenna [name], M.D., F.A.C.C. [facility]

## 2024-01-24 ENCOUNTER — Ambulatory Visit (INDEPENDENT_AMBULATORY_CARE_PROVIDER_SITE_OTHER): Admitting: *Deleted

## 2024-01-24 DIAGNOSIS — E538 Deficiency of other specified B group vitamins: Secondary | ICD-10-CM | POA: Diagnosis not present

## 2024-01-24 NOTE — Progress Notes (Signed)
 Patient is in office today for a nurse visit for B12 Injection. Patient Injection was given in the  Left deltoid. Patient tolerated injection well.

## 2024-01-26 ENCOUNTER — Other Ambulatory Visit: Payer: Self-pay | Admitting: Family Medicine

## 2024-01-26 DIAGNOSIS — I480 Paroxysmal atrial fibrillation: Secondary | ICD-10-CM

## 2024-01-28 ENCOUNTER — Other Ambulatory Visit: Payer: Self-pay | Admitting: Family Medicine

## 2024-01-28 DIAGNOSIS — K219 Gastro-esophageal reflux disease without esophagitis: Secondary | ICD-10-CM

## 2024-02-10 ENCOUNTER — Encounter: Payer: Self-pay | Admitting: Family Medicine

## 2024-02-10 ENCOUNTER — Ambulatory Visit (INDEPENDENT_AMBULATORY_CARE_PROVIDER_SITE_OTHER)

## 2024-02-10 ENCOUNTER — Ambulatory Visit: Admitting: Family Medicine

## 2024-02-10 ENCOUNTER — Other Ambulatory Visit: Payer: Self-pay | Admitting: Family Medicine

## 2024-02-10 VITALS — BP 138/69 | HR 70 | Ht 70.0 in | Wt 196.0 lb

## 2024-02-10 DIAGNOSIS — Z789 Other specified health status: Secondary | ICD-10-CM

## 2024-02-10 DIAGNOSIS — S3992XA Unspecified injury of lower back, initial encounter: Secondary | ICD-10-CM

## 2024-02-10 DIAGNOSIS — E1159 Type 2 diabetes mellitus with other circulatory complications: Secondary | ICD-10-CM

## 2024-02-10 DIAGNOSIS — E039 Hypothyroidism, unspecified: Secondary | ICD-10-CM

## 2024-02-10 DIAGNOSIS — I10 Essential (primary) hypertension: Secondary | ICD-10-CM

## 2024-02-10 DIAGNOSIS — I152 Hypertension secondary to endocrine disorders: Secondary | ICD-10-CM | POA: Diagnosis not present

## 2024-02-10 DIAGNOSIS — E1169 Type 2 diabetes mellitus with other specified complication: Secondary | ICD-10-CM | POA: Diagnosis not present

## 2024-02-10 DIAGNOSIS — I700 Atherosclerosis of aorta: Secondary | ICD-10-CM | POA: Diagnosis not present

## 2024-02-10 DIAGNOSIS — I480 Paroxysmal atrial fibrillation: Secondary | ICD-10-CM | POA: Diagnosis not present

## 2024-02-10 DIAGNOSIS — E782 Mixed hyperlipidemia: Secondary | ICD-10-CM

## 2024-02-10 DIAGNOSIS — I878 Other specified disorders of veins: Secondary | ICD-10-CM | POA: Diagnosis not present

## 2024-02-10 DIAGNOSIS — M51369 Other intervertebral disc degeneration, lumbar region without mention of lumbar back pain or lower extremity pain: Secondary | ICD-10-CM | POA: Diagnosis not present

## 2024-02-10 LAB — BAYER DCA HB A1C WAIVED: HB A1C (BAYER DCA - WAIVED): 6.7 % — ABNORMAL HIGH (ref 4.8–5.6)

## 2024-02-10 MED ORDER — METOPROLOL SUCCINATE ER 50 MG PO TB24: 50.0000 mg | ORAL_TABLET | Freq: Every day | ORAL | 3 refills | Status: AC

## 2024-02-10 MED ORDER — RIVAROXABAN 20 MG PO TABS
20.0000 mg | ORAL_TABLET | Freq: Every day | ORAL | 3 refills | Status: AC
Start: 2024-02-10 — End: ?

## 2024-02-10 NOTE — Progress Notes (Signed)
 BP 138/69   Pulse 70   Ht 5' 10 (1.778 m)   Wt 196 lb (88.9 kg)   SpO2 94%   BMI 28.12 kg/m    Subjective:   Patient ID: Jenna [name], female    DOB: 15-Dec-1935, 88 y.o.   MRN: [id number]  HPI: Jenna [name] is a 88 y.o. female presenting on 02/10/2024 for Medical Management of Chronic Issues, Diabetes, Hypertension, and Hypothyroidism   Discussed the use of AI scribe software for clinical note transcription with the patient, who gave verbal consent to proceed.  History of Present Illness   Jenna [name] is an 88 year old female with diabetes who presents with numbness in her toes and tailbone pain.  She experiences numbness in her toes, describing them as not feeling 'real right.' The numbness is localized to the toes and does not extend to the rest of the foot. Despite the numbness, she acknowledges having some sensation in the toes. Additionally, she notes swelling in her ankles, particularly noticeable after being on her feet, which causes her ankles to swell over the top of her shoes by the evening.  She describes a recent incident involving her tailbone, which occurred six days ago during a church event. She accidentally sat on a large thermal object, impacting her tailbone. Since then, she has experienced significant pain in the tailbone area, making it difficult for her to stand. The pain is localized to the center of the tailbone, with no pain radiating to the sides.  She is currently taking metoprolol  for blood pressure management, with home readings mostly in the 130s to 140s, though occasionally reaching the 150s. She has not been consistent with her blood pressure monitoring, only recording select days. She is also on a blood thinner for atrial fibrillation, which she continues to take regularly.  Regarding her cholesterol management, she has not tried the prescribed medications Zetia  or Nexletol , expressing concerns about past experiences with statins. She  believes dietary changes could help manage her cholesterol but admits to challenges in maintaining a healthy diet, particularly due to her husband's preference for sweet foods.  Her A1c was recently checked and is at 6.7.          Relevant past medical, surgical, family and social history reviewed and updated as indicated. Interim medical history since our last visit reviewed. Allergies and medications reviewed and updated.  Review of Systems  Constitutional:  Negative for chills and fever.  HENT:  Negative for congestion, ear discharge and ear pain.   Eyes:  Negative for redness and visual disturbance.  Respiratory:  Negative for chest tightness and shortness of breath.   Cardiovascular:  Negative for chest pain and leg swelling.  Genitourinary:  Negative for difficulty urinating and dysuria.  Musculoskeletal:  Positive for arthralgias and myalgias. Negative for back pain and gait problem.  Skin:  Negative for rash.  Neurological:  Positive for numbness. Negative for light-headedness and headaches.  Psychiatric/Behavioral:  Negative for agitation and behavioral problems.   All other systems reviewed and are negative.   Per HPI unless specifically indicated above   Allergies as of 02/10/2024       Reactions   Daypro [oxaprozin]    Unknown reaction   Diclofenac Sodium    Unknown reaction   Effexor [venlafaxine Hydrochloride]    Unknown reaction   Fluoxetine Hcl    Other Reaction(s): Not available, Not available, Not available, Not available, Not available   Lidocaine -prilocaine    Amitriptyline  Rash   Cymbalta [duloxetine Hcl] Nausea Only   Duloxetine Nausea Only   Lipitor [atorvastatin Calcium ] Other (See Comments)   Leg aches   Penicillins Rash   Has patient had a PCN reaction causing immediate rash, facial/tongue/throat swelling, SOB or lightheadedness with hypotension: Yes Has patient had a PCN reaction causing severe rash involving mucus membranes or skin necrosis:  No Has patient had a PCN reaction that required hospitalization: No Has patient had a PCN reaction occurring within the last 10 years: No If all of the above answers are NO, then may proceed with Cephalosporin use.   Savella [milnacipran Hcl] Rash   Sulfa Antibiotics Rash   Zocor [simvastatin] Other (See Comments)   Hip pain        Medication List        Accurate as of February 10, 2024 11:01 AM. If you have any questions, ask your nurse or doctor.          STOP taking these medications    Nexletol  180 MG Tabs Generic drug: Bempedoic Acid  Stopped by: Fonda LABOR Shamieka Gullo       TAKE these medications    acetaminophen  500 MG tablet Commonly known as: TYLENOL  Take 500 mg by mouth daily as needed for moderate pain or headache.   colchicine  0.6 MG tablet take 1 tablet daily as needed for gout flare up.   GARLIC CHOLESTA HEALTH PO Take by mouth.   hydrocortisone  2.5 % rectal cream Commonly known as: ANUSOL -HC apply rectally daily as needed for hemmorhoids or itching.   levothyroxine  88 MCG tablet Commonly known as: SYNTHROID  take 1 tablet (88 MICROGRAM total) by mouth daily.   metoprolol  succinate 50 MG 24 hr tablet Commonly known as: TOPROL -XL Take 1 tablet (50 mg total) by mouth daily. take 1 tablet daily with or immediately following a meal.   nitroGLYCERIN  0.4 MG SL tablet Commonly known as: NITROSTAT  PLACE 1 TAB UNDER TONGUE EVERY 3 MINUTES AS DIRECTED UP TO 3 TIMES AS NEEDED FOR CHEST PAIN.   pantoprazole  40 MG tablet Commonly known as: PROTONIX  take 1 tablet (40 MILLIGRAM total) by mouth daily.   rivaroxaban  20 MG Tabs tablet Commonly known as: Xarelto  Take 1 tablet (20 mg total) by mouth daily with supper.   Vitamin D3 50 MCG (2000 UT) Tabs Take 2,000 Units by mouth daily.         Objective:   BP 138/69   Pulse 70   Ht 5' 10 (1.778 m)   Wt 196 lb (88.9 kg)   SpO2 94%   BMI 28.12 kg/m   Wt Readings from Last 3 Encounters:   02/10/24 196 lb (88.9 kg)  01/18/24 198 lb 3.2 oz (89.9 kg)  11/11/23 199 lb 11.8 oz (90.6 kg)    Physical Exam Physical Exam   CHEST: Lungs clear to auscultation bilaterally. CARDIOVASCULAR: Heart sounds regular, no arrhythmia. EXTREMITIES: Mild swelling in extremities, pulses present. MUSCULOSKELETAL: Tenderness at tailbone. NEUROLOGICAL: Sensation intact in toes.       X-ray coccyx: Pending  Assessment & Plan:   Problem List Items Addressed This Visit       Cardiovascular and Mediastinum   AF (paroxysmal atrial fibrillation) (HCC)   Relevant Medications   rivaroxaban  (XARELTO ) 20 MG TABS tablet   metoprolol  succinate (TOPROL -XL) 50 MG 24 hr tablet   Hypertension associated with diabetes (HCC)   Relevant Medications   rivaroxaban  (XARELTO ) 20 MG TABS tablet   metoprolol  succinate (TOPROL -XL) 50 MG 24 hr tablet  Endocrine   Hypothyroidism   Relevant Medications   metoprolol  succinate (TOPROL -XL) 50 MG 24 hr tablet   Type 2 diabetes mellitus with other specified complication (HCC) - Primary   Relevant Orders   Bayer DCA Hb A1c Waived     Other   Hyperlipidemia   Relevant Medications   rivaroxaban  (XARELTO ) 20 MG TABS tablet   metoprolol  succinate (TOPROL -XL) 50 MG 24 hr tablet   Statin intolerance   Other Visit Diagnoses       Injury of coccyx, initial encounter       Relevant Orders   DG Sacrum/Coccyx          Type 2 diabetes mellitus with diabetic neuropathy and peripheral edema Mild diabetic neuropathy with numbness and peripheral edema. A1c at 6.7 indicates good glycemic control. Neuropathy likely due to diabetes. - Continue current diabetes management to maintain A1c at target levels.  Coccyx injury, possible fracture or contusion Recent coccyx injury with significant pain affecting mobility. Possible fracture or contusion. X-ray can confirm diagnosis, but management unchanged as no specific treatment for coccyx fracture. - Order X-ray of the  coccyx to assess for fracture or contusion.  Paroxysmal atrial fibrillation Managed with anticoagulation therapy. Recent cardiology evaluation satisfactory, no current signs of atrial fibrillation. - Continue current anticoagulation therapy.  Mixed hyperlipidemia Untreated due to past adverse reactions to statins. Zetia  or Nexletol  are potential non-statin options. Dietary modifications not implemented. - Encourage trial of Zetia  or Nexletol . - Reinforce dietary modifications to manage cholesterol levels.  Essential hypertension Blood pressure mostly in 130s to 140s, occasionally 150s. Managed with metoprolol , effective in maintaining acceptable range. - Continue metoprolol . - Monitor blood pressure regularly at home.       Follow up plan: Return in about 3 months (around 05/11/2024), or if symptoms worsen or fail to improve, for Diabetes.  Counseling provided for all of the vaccine components Orders Placed This Encounter  Procedures   DG Sacrum/Coccyx   Bayer [facility] Hb A1c Waived    Fonda Levins, MD [facility] 02/10/2024, 11:01 AM

## 2024-02-14 ENCOUNTER — Telehealth: Payer: Self-pay

## 2024-02-14 NOTE — Telephone Encounter (Signed)
 Copied from CRM #[id number]. Topic: Clinical - Medication Question >> Feb 14, 2024 11:45 AM Sophia H wrote: Reason for CRM: Patient is wanting to know if Dr. Maryanne can send in an RX to help with her pain from her tailbone injury. Please advise, was seen on 09/19 for this. # [phone]   [facility] - [place], [place] - [address]  Advised of 3 business day turnaround

## 2024-02-15 NOTE — Telephone Encounter (Signed)
 Pt already takes Tylenol  pretty much everyday. Is there anything else?

## 2024-02-15 NOTE — Telephone Encounter (Signed)
 Unfortunately no because she is on a blood thinner

## 2024-02-15 NOTE — Telephone Encounter (Signed)
 Left message for pt to return call.

## 2024-02-15 NOTE — Telephone Encounter (Signed)
 She is on a blood thinner so she cannot take a lot of medicines, this really leaves her with Tylenol .

## 2024-02-15 NOTE — Telephone Encounter (Signed)
 Pt informed of Dr. Dettinger;s recommendations. Explained that injections for pain cannot be given as well. Steroid because it impedes healing and Toradol can thin the blood as well.  Advised pt to continue with Tylenol  daily and give it more time. Pt understood.

## 2024-02-21 ENCOUNTER — Telehealth: Payer: Self-pay

## 2024-02-21 NOTE — Telephone Encounter (Signed)
 Copied from CRM #[id number]. Topic: Clinical - Lab/Test Results >> Feb 21, 2024  1:06 PM Sophia H wrote: Reason for CRM: Patient is following up on X-ray results. Patient is requesting call back to discuss, # [phone]  Patient also has some questions regarding pain meds, states she was told she could not take any? Please advise.

## 2024-02-22 NOTE — Telephone Encounter (Signed)
 X-ray did not show any fracture but commonly hairline fracture will not show up on it which may have been the case for her.  Yes unfortunately the only nonnarcotic pain medicine that she can take is Tylenol  because she is on blood thinners and narcotic pain medicines in the elderly typically lead to increased falls and confusion so we do not like to use them very often in the elderly.

## 2024-02-22 NOTE — Telephone Encounter (Signed)
 Pt made aware and understood. She is still in a lot of pain. She will keep taking Tylenol  and increase protein in diet. She will continue with Ca and Vit d as well. Informed that this could take several weeks to heal.

## 2024-02-24 ENCOUNTER — Ambulatory Visit: Admitting: *Deleted

## 2024-02-24 DIAGNOSIS — E538 Deficiency of other specified B group vitamins: Secondary | ICD-10-CM

## 2024-02-24 NOTE — Progress Notes (Signed)
 Patient is in office today for a nurse visit for B12 Injection. Patient Injection was given in the  Right deltoid. Patient tolerated injection well.

## 2024-03-07 ENCOUNTER — Encounter (INDEPENDENT_AMBULATORY_CARE_PROVIDER_SITE_OTHER): Payer: Self-pay | Admitting: Gastroenterology

## 2024-03-13 DIAGNOSIS — H353122 Nonexudative age-related macular degeneration, left eye, intermediate dry stage: Secondary | ICD-10-CM | POA: Diagnosis not present

## 2024-03-26 ENCOUNTER — Ambulatory Visit (INDEPENDENT_AMBULATORY_CARE_PROVIDER_SITE_OTHER): Payer: Self-pay | Admitting: *Deleted

## 2024-03-26 DIAGNOSIS — E538 Deficiency of other specified B group vitamins: Secondary | ICD-10-CM | POA: Diagnosis not present

## 2024-03-26 NOTE — Progress Notes (Signed)
 Patient is in office today for a nurse visit for B12 Injection. Patient Injection was given in the  Left deltoid. Patient tolerated injection well.

## 2024-04-25 ENCOUNTER — Ambulatory Visit

## 2024-04-25 DIAGNOSIS — E538 Deficiency of other specified B group vitamins: Secondary | ICD-10-CM

## 2024-04-25 NOTE — Progress Notes (Signed)
 Patient is in office today for a nurse visit for B12 Injection. Patient Injection was given in the  Left deltoid. Patient tolerated injection well.

## 2024-04-26 ENCOUNTER — Other Ambulatory Visit: Payer: Self-pay | Admitting: Family Medicine

## 2024-04-26 DIAGNOSIS — K219 Gastro-esophageal reflux disease without esophagitis: Secondary | ICD-10-CM

## 2024-05-04 ENCOUNTER — Inpatient Hospital Stay: Attending: Hematology

## 2024-05-04 DIAGNOSIS — D500 Iron deficiency anemia secondary to blood loss (chronic): Secondary | ICD-10-CM

## 2024-05-04 DIAGNOSIS — D509 Iron deficiency anemia, unspecified: Secondary | ICD-10-CM | POA: Diagnosis present

## 2024-05-04 DIAGNOSIS — E538 Deficiency of other specified B group vitamins: Secondary | ICD-10-CM | POA: Insufficient documentation

## 2024-05-04 LAB — CBC WITH DIFFERENTIAL/PLATELET
Abs Immature Granulocytes: 0.02 K/uL (ref 0.00–0.07)
Basophils Absolute: 0 K/uL (ref 0.0–0.1)
Basophils Relative: 1 %
Eosinophils Absolute: 0.1 K/uL (ref 0.0–0.5)
Eosinophils Relative: 1 %
HCT: 39.3 % (ref 36.0–46.0)
Hemoglobin: 13.4 g/dL (ref 12.0–15.0)
Immature Granulocytes: 0 %
Lymphocytes Relative: 18 %
Lymphs Abs: 1 K/uL (ref 0.7–4.0)
MCH: 33.4 pg (ref 26.0–34.0)
MCHC: 34.1 g/dL (ref 30.0–36.0)
MCV: 98 fL (ref 80.0–100.0)
Monocytes Absolute: 0.5 K/uL (ref 0.1–1.0)
Monocytes Relative: 9 %
Neutro Abs: 4.1 K/uL (ref 1.7–7.7)
Neutrophils Relative %: 71 %
Platelets: 175 K/uL (ref 150–400)
RBC: 4.01 MIL/uL (ref 3.87–5.11)
RDW: 12.1 % (ref 11.5–15.5)
WBC: 5.8 K/uL (ref 4.0–10.5)
nRBC: 0 % (ref 0.0–0.2)

## 2024-05-04 LAB — IRON AND TIBC
Iron: 95 ug/dL (ref 28–170)
Saturation Ratios: 34 % — ABNORMAL HIGH (ref 10.4–31.8)
TIBC: 284 ug/dL (ref 250–450)
UIBC: 189 ug/dL

## 2024-05-04 LAB — VITAMIN B12: Vitamin B-12: 723 pg/mL (ref 180–914)

## 2024-05-04 LAB — FERRITIN: Ferritin: 342 ng/mL — ABNORMAL HIGH (ref 11–307)

## 2024-05-08 LAB — METHYLMALONIC ACID, SERUM: Methylmalonic Acid, Quantitative: 146 nmol/L (ref 0–378)

## 2024-05-11 ENCOUNTER — Inpatient Hospital Stay (HOSPITAL_BASED_OUTPATIENT_CLINIC_OR_DEPARTMENT_OTHER): Admitting: Oncology

## 2024-05-11 DIAGNOSIS — D500 Iron deficiency anemia secondary to blood loss (chronic): Secondary | ICD-10-CM | POA: Diagnosis not present

## 2024-05-11 DIAGNOSIS — E538 Deficiency of other specified B group vitamins: Secondary | ICD-10-CM | POA: Diagnosis not present

## 2024-05-11 NOTE — Progress Notes (Unsigned)
 "    [facility] at [facility] [address], [place], [place] [address] Main: [phone] Fax: [phone]  Follow-up Visit    Patient Care Team: Dettinger, Fonda LABOR, MD as PCP - General (Family Medicine) Debera Jayson MATSU, MD as PCP - Cardiology (Cardiology) Golda Claudis PENNER, MD (Inactive) as Consulting Physician (Gastroenterology) Debera Jayson MATSU, MD as Consulting Physician (Cardiology) Melodi Lerner, MD as Consulting Physician (Orthopedic Surgery) Dr Willma [facility], OD (Optometry)   Name of the patient: Jenna [name]  [id number]  21-Nov-1935   Date of visit: 05/11/2024   History of Present Illness: Jenna [name] is an 88 year old female who is followed by hematology for iron  deficiency anemia.    She is currently receiving monthly B12 injections here at our clinic.  She received 4 doses of 300 mg IV Venofer  between 03/11/2023 and 04/01/2023.  Reports tolerating infusions well. Slight improvement in energy levels.  Reports occasional hemorrhoidal bleeding when she is constipated.  Since her last visit, she denies any hospitalizations, surgeries or changes to her baseline health. Unable to tolerate oral iron  secondary to severe constipation.  Reports she has been feeling slightly more fatigued over the last month or so.  Observations/Objective:Review of Systems  Constitutional:  Positive for malaise/fatigue.  Respiratory:  Positive for shortness of breath.   Neurological:  Positive for sensory change.  Psychiatric/Behavioral:  The patient has insomnia.      Physical Exam Constitutional:      Appearance: Normal appearance.  Cardiovascular:     Rate and Rhythm: Normal rate and regular rhythm.  Pulmonary:     Effort: Pulmonary effort is normal.     Breath sounds: Normal breath sounds.  Abdominal:     General: Bowel sounds are normal.     Palpations: Abdomen is soft.  Musculoskeletal:        General: No swelling. Normal range of  motion.  Neurological:     Mental Status: She is alert and oriented to person, place, and time. Mental status is at baseline.     Assessment and Plan: 1. Iron  deficiency anemia due to chronic blood loss (Primary) -Has been receiving intermittent IV iron  for several years. -Etiology felt to be secondary to malabsorption.  She is currently taking pantoprazole  for esophagitis.  Had upper endoscopy on 05/01/2018 which showed a 6 mm polyp with ulceration but no bleeding and no stigmata of recent bleeding in the gastric body and posterior wall of the stomach.  Pathology was negative for H. pylori. -She received 4 doses of 300 mg IV Venofer  on 03/11/2023, 03/18/2023, 03/25/2023 and 04/01/2023 with good tolerance. -Repeat lab work from 11/03/2023 shows a hemoglobin of 13.6, hematocrit 42 with MCV 97.  Differential is unremarkable.  Iron  saturation is 21%, TIBC 306 and ferritin is 38. - Given decline in ferritin levels, would recommend for additional doses of 300 mg IV Venofer  given over the next month or so.  Based on history, she needs IV iron  approximately every 6 to 7 months. -She has been unable to tolerate oral iron  due to GI upset. - Return to clinic in 6 months with labs a few days before.  2. B12 deficiency -Has been receiving monthly B12 injections. -Last B12 injection was on 10/18/23. -Labs from 11/08/2023 show a vitamin B12 level of 414. -Recommend she continue B12 injections monthly.  Follow Up Instructions: PLAN SUMMARY: >> Continue monthly B12 injections. >> No additional IV iron  needed at this time. >> Based on history, recommend follow-up in  6 months.  She has needed IV iron  approximately every 6 to 7 months.       I discussed the assessment and treatment plan with the patient. The patient was provided an opportunity to ask questions and all were answered. The patient agreed with the plan and demonstrated an understanding of the instructions.   The patient was advised to call back  or seek an in-person evaluation if the symptoms worsen or if the condition fails to improve as anticipated.  I spent 25 minutes dedicated to the care of this patient (face-to-face and non-face-to-face) on the date of the encounter to include what is described in the assessment and plan.   Delon FORBES [place], NP  "

## 2024-05-14 ENCOUNTER — Encounter: Payer: Self-pay | Admitting: Oncology

## 2024-05-21 ENCOUNTER — Encounter: Payer: Self-pay | Admitting: *Deleted

## 2024-05-28 ENCOUNTER — Ambulatory Visit

## 2024-05-28 ENCOUNTER — Encounter: Payer: Self-pay | Admitting: Family Medicine

## 2024-05-28 ENCOUNTER — Ambulatory Visit (INDEPENDENT_AMBULATORY_CARE_PROVIDER_SITE_OTHER): Payer: Self-pay | Admitting: Family Medicine

## 2024-05-28 VITALS — BP 168/60 | HR 61 | Temp 98.1°F | Ht 70.0 in | Wt 199.0 lb

## 2024-05-28 DIAGNOSIS — I10 Essential (primary) hypertension: Secondary | ICD-10-CM

## 2024-05-28 DIAGNOSIS — E538 Deficiency of other specified B group vitamins: Secondary | ICD-10-CM

## 2024-05-28 DIAGNOSIS — E782 Mixed hyperlipidemia: Secondary | ICD-10-CM | POA: Diagnosis not present

## 2024-05-28 DIAGNOSIS — E559 Vitamin D deficiency, unspecified: Secondary | ICD-10-CM

## 2024-05-28 DIAGNOSIS — K219 Gastro-esophageal reflux disease without esophagitis: Secondary | ICD-10-CM

## 2024-05-28 DIAGNOSIS — Z780 Asymptomatic menopausal state: Secondary | ICD-10-CM

## 2024-05-28 DIAGNOSIS — I152 Hypertension secondary to endocrine disorders: Secondary | ICD-10-CM

## 2024-05-28 DIAGNOSIS — M8580 Other specified disorders of bone density and structure, unspecified site: Secondary | ICD-10-CM

## 2024-05-28 DIAGNOSIS — E039 Hypothyroidism, unspecified: Secondary | ICD-10-CM | POA: Diagnosis not present

## 2024-05-28 DIAGNOSIS — E1159 Type 2 diabetes mellitus with other circulatory complications: Secondary | ICD-10-CM

## 2024-05-28 DIAGNOSIS — I480 Paroxysmal atrial fibrillation: Secondary | ICD-10-CM

## 2024-05-28 DIAGNOSIS — E1169 Type 2 diabetes mellitus with other specified complication: Secondary | ICD-10-CM | POA: Diagnosis not present

## 2024-05-28 LAB — LIPID PANEL
Chol/HDL Ratio: 3.8 ratio (ref 0.0–4.4)
Cholesterol, Total: 222 mg/dL — ABNORMAL HIGH (ref 100–199)
HDL: 59 mg/dL
LDL Chol Calc (NIH): 135 mg/dL — ABNORMAL HIGH (ref 0–99)
Triglycerides: 160 mg/dL — ABNORMAL HIGH (ref 0–149)
VLDL Cholesterol Cal: 28 mg/dL (ref 5–40)

## 2024-05-28 LAB — CMP14+EGFR
ALT: 10 IU/L (ref 0–32)
AST: 12 IU/L (ref 0–40)
Albumin: 4.2 g/dL (ref 3.7–4.7)
Alkaline Phosphatase: 99 IU/L (ref 48–129)
BUN/Creatinine Ratio: 11 — ABNORMAL LOW (ref 12–28)
BUN: 7 mg/dL — ABNORMAL LOW (ref 8–27)
Bilirubin Total: 0.8 mg/dL (ref 0.0–1.2)
CO2: 27 mmol/L (ref 20–29)
Calcium: 8.9 mg/dL (ref 8.7–10.3)
Chloride: 98 mmol/L (ref 96–106)
Creatinine, Ser: 0.63 mg/dL (ref 0.57–1.00)
Globulin, Total: 2.1 g/dL (ref 1.5–4.5)
Glucose: 131 mg/dL — ABNORMAL HIGH (ref 70–99)
Potassium: 4.4 mmol/L (ref 3.5–5.2)
Sodium: 138 mmol/L (ref 134–144)
Total Protein: 6.3 g/dL (ref 6.0–8.5)
eGFR: 86 mL/min/1.73

## 2024-05-28 LAB — BAYER DCA HB A1C WAIVED: HB A1C (BAYER DCA - WAIVED): 6.5 % — ABNORMAL HIGH (ref 4.8–5.6)

## 2024-05-28 LAB — TSH: TSH: 3.33 u[IU]/mL (ref 0.450–4.500)

## 2024-05-28 MED ORDER — PANTOPRAZOLE SODIUM 40 MG PO TBEC: 40.0000 mg | DELAYED_RELEASE_TABLET | Freq: Every day | ORAL | 3 refills | Status: AC

## 2024-05-28 NOTE — Progress Notes (Signed)
 "  BP (!) 168/60   Pulse 61   Temp 98.1 F (36.7 C)   Ht 5' 10 (1.778 m)   Wt 199 lb (90.3 kg)   SpO2 97%   BMI 28.55 kg/m    Subjective:   Patient ID: Jenna [name], female    DOB: 08-Aug-1935, 89 y.o.   MRN: [id number]  HPI: Jenna [name] is a 89 y.o. female presenting on 05/28/2024 for Medical Management of Chronic Issues, Diabetes, Hypertension, Hyperlipidemia, and Hypothyroidism   Discussed the use of AI scribe software for clinical note transcription with the patient, who gave verbal consent to proceed.  History of Present Illness   Jenna [name] is an 89 year old female with hypertension and diabetes who presents for a recheck of her blood pressure and diabetes management.  Hypertension - Blood pressure fluctuates between 140-160 mmHg, with occasional readings as low as 130 mmHg - Currently taking metoprolol  for blood pressure control  Diabetes mellitus - Recent hemoglobin A1c is 6.5% - Consumed sweets during the Christmas period  Atrial fibrillation and anticoagulation - On Xarelto  for atrial fibrillation - No bleeding issues - Recent iron  levels within normal limits - Iron  levels monitored every three months  Musculoskeletal pain - Experienced severe rib pain two weeks ago, preventing turning over in bed at that time - Rib pain has since improved and is not currently bothersome  Peripheral edema - Legs swell during the day, with significant swelling by night          Relevant past medical, surgical, family and social history reviewed and updated as indicated. Interim medical history since our last visit reviewed. Allergies and medications reviewed and updated.  Review of Systems  Constitutional:  Negative for chills and fever.  Eyes:  Negative for visual disturbance.  Respiratory:  Negative for chest tightness and shortness of breath.   Cardiovascular:  Negative for chest pain and leg swelling.  Genitourinary:  Negative for difficulty urinating  and dysuria.  Musculoskeletal:  Positive for back pain. Negative for gait problem.  Skin:  Negative for rash.  Neurological:  Negative for dizziness, light-headedness and headaches.  Psychiatric/Behavioral:  Negative for agitation and behavioral problems.   All other systems reviewed and are negative.   Per HPI unless specifically indicated above   Allergies as of 05/28/2024       Reactions   Daypro [oxaprozin]    Unknown reaction   Diclofenac Sodium    Unknown reaction   Effexor [venlafaxine Hydrochloride]    Unknown reaction   Fluoxetine Hcl    Other Reaction(s): Not available, Not available, Not available, Not available, Not available   Lidocaine -prilocaine    Amitriptyline Rash   Cymbalta [duloxetine Hcl] Nausea Only   Duloxetine Nausea Only   Lipitor [atorvastatin Calcium ] Other (See Comments)   Leg aches   Penicillins Rash   Has patient had a PCN reaction causing immediate rash, facial/tongue/throat swelling, SOB or lightheadedness with hypotension: Yes Has patient had a PCN reaction causing severe rash involving mucus membranes or skin necrosis: No Has patient had a PCN reaction that required hospitalization: No Has patient had a PCN reaction occurring within the last 10 years: No If all of the above answers are NO, then may proceed with Cephalosporin use.   Savella [milnacipran Hcl] Rash   Sulfa Antibiotics Rash   Zocor [simvastatin] Other (See Comments)   Hip pain        Medication List        Accurate  as of May 28, 2024 10:32 AM. If you have any questions, ask your nurse or doctor.          acetaminophen  500 MG tablet Commonly known as: TYLENOL  Take 500 mg by mouth daily as needed for moderate pain or headache.   colchicine  0.6 MG tablet take 1 tablet daily as needed for gout flare up.   GARLIC CHOLESTA HEALTH PO Take by mouth.   hydrocortisone  2.5 % rectal cream Commonly known as: ANUSOL -HC apply rectally daily as needed for hemmorhoids or  itching.   levothyroxine  88 MCG tablet Commonly known as: SYNTHROID  take 1 tablet (88 MICROGRAM total) by mouth daily.   metoprolol  succinate 50 MG 24 hr tablet Commonly known as: TOPROL -XL Take 1 tablet (50 mg total) by mouth daily. take 1 tablet daily with or immediately following a meal.   nitroGLYCERIN  0.4 MG SL tablet Commonly known as: NITROSTAT  PLACE 1 TAB UNDER TONGUE EVERY 3 MINUTES AS DIRECTED UP TO 3 TIMES AS NEEDED FOR CHEST PAIN.   pantoprazole  40 MG tablet Commonly known as: PROTONIX  Take 1 tablet (40 mg total) by mouth daily. What changed: See the new instructions. Changed by: Fonda Levins, MD   rivaroxaban  20 MG Tabs tablet Commonly known as: Xarelto  Take 1 tablet (20 mg total) by mouth daily with supper.   Vitamin D3 50 MCG (2000 UT) Tabs Take 2,000 Units by mouth daily.         Objective:   BP (!) 168/60   Pulse 61   Temp 98.1 F (36.7 C)   Ht 5' 10 (1.778 m)   Wt 199 lb (90.3 kg)   SpO2 97%   BMI 28.55 kg/m   Wt Readings from Last 3 Encounters:  05/28/24 199 lb (90.3 kg)  02/10/24 196 lb (88.9 kg)  01/18/24 198 lb 3.2 oz (89.9 kg)    Physical Exam Vitals and nursing note reviewed.  Constitutional:      General: She is not in acute distress.    Appearance: She is well-developed. She is not diaphoretic.  Eyes:     Conjunctiva/sclera: Conjunctivae normal.  Cardiovascular:     Rate and Rhythm: Normal rate and regular rhythm.     Heart sounds: Normal heart sounds. No murmur heard. Pulmonary:     Effort: Pulmonary effort is normal. No respiratory distress.     Breath sounds: Normal breath sounds. No wheezing.  Musculoskeletal:        General: Swelling present.  Skin:    General: Skin is warm and dry.     Findings: No rash.  Neurological:     Mental Status: She is alert and oriented to person, place, and time.     Coordination: Coordination normal.  Psychiatric:        Behavior: Behavior normal.    Physical Exam   NECK:  Thyroid  without nodules. CHEST: Lungs clear to auscultation bilaterally. CARDIOVASCULAR: Heart regular rate and rhythm, no murmurs.         Assessment & Plan:   Problem List Items Addressed This Visit       Cardiovascular and Mediastinum   AF (paroxysmal atrial fibrillation) (HCC)   Hypertension associated with diabetes (HCC)     Endocrine   Hypothyroidism   Relevant Orders   TSH   Type 2 diabetes mellitus with other specified complication (HCC) - Primary   Relevant Orders   Bayer DCA Hb A1c Waived   Lipid panel   CMP14+EGFR     Musculoskeletal and Integument  Osteopenia of the elderly   Relevant Orders   DG [facility] DEXA     Other   Hyperlipidemia   Relevant Orders   Bayer DCA Hb A1c Waived   Lipid panel   CMP14+EGFR   Vitamin D  deficiency   Relevant Orders   DG [facility] DEXA   Other Visit Diagnoses       Primary hypertension       Relevant Orders   Bayer DCA Hb A1c Waived   Lipid panel   CMP14+EGFR     Gastroesophageal reflux disease       Relevant Medications   pantoprazole  (PROTONIX ) 40 MG tablet     Post-menopausal       Relevant Orders   DG [facility] DEXA          Primary hypertension Blood pressure fluctuates between 140-160 mmHg, occasionally dropping to 130 mmHg. Currently managed with metoprolol . Discussed potential addition of amlodipine  5 mg daily, but she prefers dietary modifications first. Discussed potential side effects of amlodipine , including flushing and swelling. - Focus on dietary modifications, especially reducing salt intake. - Monitor blood pressure daily and report changes.  Type 2 diabetes mellitus with other specified complication A1c is well-controlled at 6.5%. No acute complications reported. - Continue current diabetes management plan.  Paroxysmal atrial fibrillation Managed with Xarelto . No reported bleeding complications. Recent blood work for iron  levels is normal. - Continue Xarelto  as prescribed.  Acquired  hypothyroidism Thyroid  function tests pending. No acute symptoms reported. - Await thyroid  function test results.  General health maintenance Bone density scan is due. - Performed bone density scan.          Follow up plan: Return in about 3 months (around 08/26/2024), or if symptoms worsen or fail to improve, for Diabetes and hypertension.  Counseling provided for all of the vaccine components Orders Placed This Encounter  Procedures   DG [facility] DEXA   Bayer DCA Hb A1c Waived   Lipid panel   CMP14+EGFR   TSH    Fonda Levins, MD [facility] 05/28/2024, 10:32 AM     "

## 2024-06-04 ENCOUNTER — Ambulatory Visit: Payer: Self-pay | Admitting: Family Medicine

## 2024-06-04 DIAGNOSIS — Z780 Asymptomatic menopausal state: Secondary | ICD-10-CM

## 2024-06-04 DIAGNOSIS — E559 Vitamin D deficiency, unspecified: Secondary | ICD-10-CM

## 2024-06-07 ENCOUNTER — Telehealth: Payer: Self-pay

## 2024-06-07 NOTE — Progress Notes (Signed)
 Care Guide Pharmacy Note  06/07/2024 Name: CORINE SOLORIO MRN: [id number] DOB: [birth year]  Referred By: Dettinger, Fonda LABOR, MD Reason for referral: Complex Care Management (Outreach to schedule with Pharmd )   AKASIA AHMAD is a 89 y.o. year old female who is a primary care patient of Dettinger, Fonda LABOR, MD.  Ronal LELON Bologna was referred to the pharmacist for assistance related to: osteoporosis  An unsuccessful telephone outreach was attempted today to contact the patient who was referred to the pharmacy team for assistance with medication management. Additional attempts will be made to contact the patient.  Jeoffrey Buffalo , RMA     [facility]  [facility], [facility] Guide  Direct Dial: [phone]  Website: [email or web address]

## 2024-06-08 NOTE — Progress Notes (Signed)
 Care Guide Pharmacy Note  06/08/2024 Name: Jenna [name] MRN: [id number] DOB: 1936/03/13  Referred By: Dettinger, Fonda LABOR, MD Reason for referral: Complex Care Management (Outreach to schedule with Pharmd )   Jenna [name] is a 89 y.o. year old female who is a primary care patient of Dettinger, Fonda LABOR, MD.  Jenna [name] was referred to the pharmacist for assistance related to: osteoporosis  Successful contact was made with the patient to discuss pharmacy services including being ready for the pharmacist to call at least 5 minutes before the scheduled appointment time and to have medication bottles and any blood pressure readings ready for review. The patient agreed to meet with the pharmacist via telephone visit on (date/time).07/03/2024  Jenna [name] , RMA     Jenna [name]  [facility], [facility] Guide  Direct Dial: [phone]  Website: [email or web address]

## 2024-06-09 ENCOUNTER — Other Ambulatory Visit: Payer: Self-pay | Admitting: Family Medicine

## 2024-06-15 ENCOUNTER — Other Ambulatory Visit: Payer: Self-pay | Admitting: Family Medicine

## 2024-06-15 ENCOUNTER — Other Ambulatory Visit: Payer: Self-pay

## 2024-06-15 MED ORDER — NITROGLYCERIN 0.4 MG SL SUBL: 0.4000 mg | SUBLINGUAL_TABLET | SUBLINGUAL | 0 refills | Status: AC | PRN

## 2024-06-27 ENCOUNTER — Ambulatory Visit

## 2024-06-27 DIAGNOSIS — E538 Deficiency of other specified B group vitamins: Secondary | ICD-10-CM

## 2024-06-27 NOTE — Progress Notes (Addendum)
 Patient is in office today for a nurse visit for B12 Injection. Patient Injection was given in the  Right deltoid. Patient tolerated injection well.

## 2024-07-03 ENCOUNTER — Other Ambulatory Visit

## 2024-07-25 ENCOUNTER — Ambulatory Visit

## 2024-08-08 ENCOUNTER — Inpatient Hospital Stay: Attending: Hematology

## 2024-08-27 ENCOUNTER — Ambulatory Visit: Admitting: Family Medicine

## 2024-11-01 ENCOUNTER — Inpatient Hospital Stay

## 2024-11-08 ENCOUNTER — Inpatient Hospital Stay: Admitting: Oncology
